# Patient Record
Sex: Female | Born: 1940 | Race: White | Hispanic: No | Marital: Married | Smoking: Former smoker
Health system: Southern US, Community
[De-identification: ages and names within clinical notes are randomized; demographics above are authoritative.]

## PROBLEM LIST (undated history)

## (undated) DIAGNOSIS — M8580 Other specified disorders of bone density and structure, unspecified site: Secondary | ICD-10-CM

## (undated) DIAGNOSIS — F411 Generalized anxiety disorder: Secondary | ICD-10-CM

## (undated) DIAGNOSIS — N200 Calculus of kidney: Secondary | ICD-10-CM

## (undated) DIAGNOSIS — K589 Irritable bowel syndrome without diarrhea: Secondary | ICD-10-CM

## (undated) DIAGNOSIS — H269 Unspecified cataract: Secondary | ICD-10-CM

## (undated) DIAGNOSIS — R002 Palpitations: Secondary | ICD-10-CM

## (undated) DIAGNOSIS — K5790 Diverticulosis of intestine, part unspecified, without perforation or abscess without bleeding: Secondary | ICD-10-CM

## (undated) DIAGNOSIS — K219 Gastro-esophageal reflux disease without esophagitis: Secondary | ICD-10-CM

## (undated) DIAGNOSIS — N6019 Diffuse cystic mastopathy of unspecified breast: Secondary | ICD-10-CM

## (undated) DIAGNOSIS — T7840XA Allergy, unspecified, initial encounter: Secondary | ICD-10-CM

## (undated) DIAGNOSIS — L259 Unspecified contact dermatitis, unspecified cause: Secondary | ICD-10-CM

## (undated) DIAGNOSIS — I471 Supraventricular tachycardia, unspecified: Secondary | ICD-10-CM

## (undated) DIAGNOSIS — M810 Age-related osteoporosis without current pathological fracture: Secondary | ICD-10-CM

## (undated) DIAGNOSIS — C4492 Squamous cell carcinoma of skin, unspecified: Secondary | ICD-10-CM

## (undated) DIAGNOSIS — H9209 Otalgia, unspecified ear: Secondary | ICD-10-CM

## (undated) DIAGNOSIS — R079 Chest pain, unspecified: Secondary | ICD-10-CM

## (undated) DIAGNOSIS — K861 Other chronic pancreatitis: Secondary | ICD-10-CM

## (undated) DIAGNOSIS — K7689 Other specified diseases of liver: Secondary | ICD-10-CM

## (undated) DIAGNOSIS — D126 Benign neoplasm of colon, unspecified: Secondary | ICD-10-CM

## (undated) DIAGNOSIS — M1990 Unspecified osteoarthritis, unspecified site: Secondary | ICD-10-CM

## (undated) DIAGNOSIS — I951 Orthostatic hypotension: Secondary | ICD-10-CM

## (undated) DIAGNOSIS — S6992XA Unspecified injury of left wrist, hand and finger(s), initial encounter: Secondary | ICD-10-CM

## (undated) DIAGNOSIS — Z87891 Personal history of nicotine dependence: Secondary | ICD-10-CM

## (undated) DIAGNOSIS — Z0000 Encounter for general adult medical examination without abnormal findings: Secondary | ICD-10-CM

## (undated) DIAGNOSIS — R55 Syncope and collapse: Secondary | ICD-10-CM

## (undated) DIAGNOSIS — K648 Other hemorrhoids: Secondary | ICD-10-CM

## (undated) DIAGNOSIS — Z8619 Personal history of other infectious and parasitic diseases: Secondary | ICD-10-CM

## (undated) DIAGNOSIS — E559 Vitamin D deficiency, unspecified: Secondary | ICD-10-CM

## (undated) DIAGNOSIS — N289 Disorder of kidney and ureter, unspecified: Secondary | ICD-10-CM

## (undated) DIAGNOSIS — M25519 Pain in unspecified shoulder: Secondary | ICD-10-CM

## (undated) DIAGNOSIS — K760 Fatty (change of) liver, not elsewhere classified: Secondary | ICD-10-CM

## (undated) HISTORY — DX: Encounter for general adult medical examination without abnormal findings: Z00.00

## (undated) HISTORY — DX: Other chronic pancreatitis: K86.1

## (undated) HISTORY — DX: Diffuse cystic mastopathy of unspecified breast: N60.19

## (undated) HISTORY — DX: Unspecified contact dermatitis, unspecified cause: L25.9

## (undated) HISTORY — DX: Allergy, unspecified, initial encounter: T78.40XA

## (undated) HISTORY — PX: CARDIAC CATHETERIZATION: SHX172

## (undated) HISTORY — PX: BREAST SURGERY: SHX581

## (undated) HISTORY — DX: Orthostatic hypotension: I95.1

## (undated) HISTORY — DX: Benign neoplasm of colon, unspecified: D12.6

## (undated) HISTORY — DX: Unspecified injury of left wrist, hand and finger(s), initial encounter: S69.92XA

## (undated) HISTORY — DX: Syncope and collapse: R55

## (undated) HISTORY — DX: Palpitations: R00.2

## (undated) HISTORY — DX: Unspecified osteoarthritis, unspecified site: M19.90

## (undated) HISTORY — PX: POLYPECTOMY: SHX149

## (undated) HISTORY — DX: Squamous cell carcinoma of skin, unspecified: C44.92

## (undated) HISTORY — DX: Vitamin D deficiency, unspecified: E55.9

## (undated) HISTORY — DX: Supraventricular tachycardia, unspecified: I47.10

## (undated) HISTORY — DX: Gastro-esophageal reflux disease without esophagitis: K21.9

## (undated) HISTORY — DX: Chest pain, unspecified: R07.9

## (undated) HISTORY — PX: CATARACT EXTRACTION, BILATERAL: SHX1313

## (undated) HISTORY — DX: Pain in unspecified shoulder: M25.519

## (undated) HISTORY — PX: EYE SURGERY: SHX253

## (undated) HISTORY — PX: COLONOSCOPY: SHX174

## (undated) HISTORY — DX: Age-related osteoporosis without current pathological fracture: M81.0

## (undated) HISTORY — DX: Personal history of other infectious and parasitic diseases: Z86.19

## (undated) HISTORY — DX: Other specified disorders of bone density and structure, unspecified site: M85.80

## (undated) HISTORY — DX: Irritable bowel syndrome, unspecified: K58.9

## (undated) HISTORY — DX: Generalized anxiety disorder: F41.1

## (undated) HISTORY — DX: Supraventricular tachycardia: I47.1

## (undated) HISTORY — DX: Personal history of nicotine dependence: Z87.891

## (undated) HISTORY — DX: Other hemorrhoids: K64.8

## (undated) HISTORY — PX: TUBAL LIGATION: SHX77

## (undated) HISTORY — DX: Unspecified cataract: H26.9

## (undated) HISTORY — DX: Other specified diseases of liver: K76.89

## (undated) HISTORY — DX: Otalgia, unspecified ear: H92.09

## (undated) HISTORY — DX: Diverticulosis of intestine, part unspecified, without perforation or abscess without bleeding: K57.90

## (undated) HISTORY — DX: Fatty (change of) liver, not elsewhere classified: K76.0

## (undated) HISTORY — DX: Disorder of kidney and ureter, unspecified: N28.9

---
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
## 1996-05-27 HISTORY — PX: ABDOMINAL HYSTERECTOMY: SHX81

## 1997-05-12 ENCOUNTER — Encounter: Payer: Self-pay | Admitting: Internal Medicine

## 1997-05-27 HISTORY — PX: CHOLECYSTECTOMY: SHX55

## 1997-10-03 ENCOUNTER — Other Ambulatory Visit: Admission: RE | Admit: 1997-10-03 | Discharge: 1997-10-03 | Payer: Self-pay | Admitting: *Deleted

## 1997-11-25 ENCOUNTER — Observation Stay (HOSPITAL_COMMUNITY): Admission: RE | Admit: 1997-11-25 | Discharge: 1997-11-26 | Payer: Self-pay | Admitting: General Surgery

## 1998-10-10 ENCOUNTER — Other Ambulatory Visit: Admission: RE | Admit: 1998-10-10 | Discharge: 1998-10-10 | Payer: Self-pay | Admitting: *Deleted

## 1999-10-15 ENCOUNTER — Other Ambulatory Visit: Admission: RE | Admit: 1999-10-15 | Discharge: 1999-10-15 | Payer: Self-pay | Admitting: *Deleted

## 1999-12-19 ENCOUNTER — Encounter: Payer: Self-pay | Admitting: Gastroenterology

## 2000-10-15 ENCOUNTER — Other Ambulatory Visit: Admission: RE | Admit: 2000-10-15 | Discharge: 2000-10-15 | Payer: Self-pay | Admitting: *Deleted

## 2002-03-03 ENCOUNTER — Inpatient Hospital Stay (HOSPITAL_COMMUNITY): Admission: AD | Admit: 2002-03-03 | Discharge: 2002-03-05 | Payer: Self-pay | Admitting: Cardiology

## 2002-03-03 ENCOUNTER — Encounter: Payer: Self-pay | Admitting: Cardiology

## 2002-03-04 ENCOUNTER — Encounter: Payer: Self-pay | Admitting: Cardiology

## 2003-10-31 ENCOUNTER — Other Ambulatory Visit: Admission: RE | Admit: 2003-10-31 | Discharge: 2003-10-31 | Payer: Self-pay | Admitting: Obstetrics and Gynecology

## 2004-04-05 ENCOUNTER — Ambulatory Visit: Payer: Self-pay | Admitting: Internal Medicine

## 2004-04-20 ENCOUNTER — Ambulatory Visit: Payer: Self-pay | Admitting: Gastroenterology

## 2004-05-29 ENCOUNTER — Ambulatory Visit: Payer: Self-pay | Admitting: Internal Medicine

## 2004-06-27 ENCOUNTER — Ambulatory Visit: Payer: Self-pay | Admitting: Gastroenterology

## 2004-12-18 ENCOUNTER — Ambulatory Visit: Payer: Self-pay | Admitting: Internal Medicine

## 2004-12-25 ENCOUNTER — Ambulatory Visit: Payer: Self-pay | Admitting: Gastroenterology

## 2005-01-14 ENCOUNTER — Ambulatory Visit: Payer: Self-pay | Admitting: Internal Medicine

## 2005-03-22 ENCOUNTER — Ambulatory Visit: Payer: Self-pay | Admitting: Internal Medicine

## 2005-12-26 ENCOUNTER — Ambulatory Visit: Payer: Self-pay | Admitting: Gastroenterology

## 2006-02-10 ENCOUNTER — Ambulatory Visit: Payer: Self-pay | Admitting: Internal Medicine

## 2006-03-28 ENCOUNTER — Ambulatory Visit: Payer: Self-pay | Admitting: Internal Medicine

## 2006-06-18 ENCOUNTER — Ambulatory Visit: Payer: Self-pay | Admitting: Internal Medicine

## 2006-09-18 ENCOUNTER — Ambulatory Visit: Payer: Self-pay | Admitting: Internal Medicine

## 2007-01-06 ENCOUNTER — Ambulatory Visit: Payer: Self-pay | Admitting: Gastroenterology

## 2007-01-06 LAB — CONVERTED CEMR LAB
ALT: 33 units/L (ref 0–35)
AST: 35 units/L (ref 0–37)
Albumin: 3.8 g/dL (ref 3.5–5.2)
Alkaline Phosphatase: 71 units/L (ref 39–117)
Amylase: 157 units/L — ABNORMAL HIGH (ref 27–131)
BUN: 13 mg/dL (ref 6–23)
Basophils Absolute: 0.1 10*3/uL (ref 0.0–0.1)
Basophils Relative: 0.8 % (ref 0.0–1.0)
Bilirubin, Direct: 0.1 mg/dL (ref 0.0–0.3)
CO2: 32 meq/L (ref 19–32)
Calcium: 8.8 mg/dL (ref 8.4–10.5)
Chloride: 105 meq/L (ref 96–112)
Creatinine, Ser: 0.8 mg/dL (ref 0.4–1.2)
Eosinophils Absolute: 0.3 10*3/uL (ref 0.0–0.6)
Eosinophils Relative: 3.8 % (ref 0.0–5.0)
Ferritin: 19.4 ng/mL (ref 10.0–291.0)
Folate: >20 ng/mL
GFR calc Af Amer: 92 mL/min
GFR calc non Af Amer: 76 mL/min
Glucose, Bld: 97 mg/dL (ref 70–99)
HCT: 39.6 % (ref 36.0–46.0)
Hemoglobin: 13.5 g/dL (ref 12.0–15.0)
Iron: 91 ug/dL (ref 42–145)
Lipase: 60 units/L — ABNORMAL HIGH (ref 11.0–59.0)
Lymphocytes Relative: 27.2 % (ref 12.0–46.0)
MCHC: 34 g/dL (ref 30.0–36.0)
MCV: 92.1 fL (ref 78.0–100.0)
Monocytes Absolute: 0.6 10*3/uL (ref 0.2–0.7)
Monocytes Relative: 8.6 % (ref 3.0–11.0)
Neutro Abs: 4.2 10*3/uL (ref 1.4–7.7)
Neutrophils Relative %: 59.6 % (ref 43.0–77.0)
Platelets: 270 10*3/uL (ref 150–400)
Potassium: 4.4 meq/L (ref 3.5–5.1)
RBC: 4.3 M/uL (ref 3.87–5.11)
RDW: 13.4 % (ref 11.5–14.6)
Saturation Ratios: 25.6 % (ref 20.0–50.0)
Sodium: 143 meq/L (ref 135–145)
TSH: 1.99 microintl units/mL (ref 0.35–5.50)
Tissue Transglutaminase Ab, IgA: 0.2 units (ref <7)
Total Bilirubin: 0.8 mg/dL (ref 0.3–1.2)
Total Protein: 6.3 g/dL (ref 6.0–8.3)
Transferrin: 254.2 mg/dL (ref >=212.0)
Vitamin B-12: 765 pg/mL (ref 211–911)
WBC: 7.2 10*3/uL (ref 4.5–10.5)

## 2007-01-10 ENCOUNTER — Encounter: Payer: Self-pay | Admitting: Internal Medicine

## 2007-01-10 DIAGNOSIS — I471 Supraventricular tachycardia, unspecified: Secondary | ICD-10-CM | POA: Insufficient documentation

## 2007-01-10 DIAGNOSIS — K861 Other chronic pancreatitis: Secondary | ICD-10-CM

## 2007-01-10 DIAGNOSIS — M1990 Unspecified osteoarthritis, unspecified site: Secondary | ICD-10-CM | POA: Insufficient documentation

## 2007-01-16 ENCOUNTER — Ambulatory Visit (HOSPITAL_COMMUNITY): Admission: RE | Admit: 2007-01-16 | Discharge: 2007-01-16 | Payer: Self-pay | Admitting: Gastroenterology

## 2007-02-17 ENCOUNTER — Ambulatory Visit: Payer: Self-pay | Admitting: Internal Medicine

## 2007-03-27 ENCOUNTER — Ambulatory Visit: Payer: Self-pay | Admitting: Internal Medicine

## 2007-05-01 ENCOUNTER — Ambulatory Visit: Payer: Self-pay | Admitting: Internal Medicine

## 2007-05-01 DIAGNOSIS — N3090 Cystitis, unspecified without hematuria: Secondary | ICD-10-CM | POA: Insufficient documentation

## 2007-05-01 DIAGNOSIS — E559 Vitamin D deficiency, unspecified: Secondary | ICD-10-CM

## 2007-05-01 DIAGNOSIS — K649 Unspecified hemorrhoids: Secondary | ICD-10-CM

## 2007-05-01 DIAGNOSIS — K219 Gastro-esophageal reflux disease without esophagitis: Secondary | ICD-10-CM

## 2007-05-01 DIAGNOSIS — R300 Dysuria: Secondary | ICD-10-CM

## 2007-05-04 LAB — CONVERTED CEMR LAB
Ketones, ur: NEGATIVE mg/dL
Leukocytes, UA: NEGATIVE
Specific Gravity, Urine: >=1.03 (ref 1.000–1.03)
Urine Glucose: NEGATIVE mg/dL
Urobilinogen, UA: 0.2 (ref 0.0–1.0)
pH: 6 (ref 5.0–8.0)

## 2007-05-06 ENCOUNTER — Encounter: Payer: Self-pay | Admitting: Internal Medicine

## 2007-05-13 ENCOUNTER — Encounter: Payer: Self-pay | Admitting: Internal Medicine

## 2007-05-28 LAB — HM COLONOSCOPY: HM Colonoscopy: NORMAL

## 2007-06-23 ENCOUNTER — Ambulatory Visit: Payer: Self-pay | Admitting: Gastroenterology

## 2007-06-23 LAB — CONVERTED CEMR LAB
ALT: 18 units/L (ref 0–35)
AST: 20 units/L (ref 0–37)
Albumin: 3.9 g/dL (ref 3.5–5.2)
Basophils Absolute: 0 10*3/uL (ref 0.0–0.1)
Calcium: 8.8 mg/dL (ref 8.4–10.5)
Chloride: 104 meq/L (ref 96–112)
Eosinophils Absolute: 0.1 10*3/uL (ref 0.0–0.6)
Eosinophils Relative: 0.8 % (ref 0.0–5.0)
GFR calc non Af Amer: 76 mL/min
Glucose, Bld: 92 mg/dL (ref 70–99)
MCV: 93.4 fL (ref 78.0–100.0)
Platelets: 235 10*3/uL (ref 150–400)
RBC: 4.3 M/uL (ref 3.87–5.11)
RDW: 13 % (ref 11.5–14.6)
TSH: 1.92 microintl units/mL (ref 0.35–5.50)
WBC: 7.1 10*3/uL (ref 4.5–10.5)

## 2007-06-30 ENCOUNTER — Ambulatory Visit: Payer: Self-pay | Admitting: Gastroenterology

## 2007-08-29 DIAGNOSIS — F411 Generalized anxiety disorder: Secondary | ICD-10-CM

## 2007-09-01 ENCOUNTER — Ambulatory Visit: Payer: Self-pay | Admitting: Internal Medicine

## 2007-12-29 ENCOUNTER — Ambulatory Visit: Payer: Self-pay | Admitting: Internal Medicine

## 2007-12-29 DIAGNOSIS — M25519 Pain in unspecified shoulder: Secondary | ICD-10-CM

## 2008-01-12 ENCOUNTER — Ambulatory Visit: Payer: Self-pay | Admitting: Gastroenterology

## 2008-01-12 DIAGNOSIS — K5900 Constipation, unspecified: Secondary | ICD-10-CM | POA: Insufficient documentation

## 2008-01-25 ENCOUNTER — Telehealth: Payer: Self-pay | Admitting: Gastroenterology

## 2008-01-27 ENCOUNTER — Ambulatory Visit: Payer: Self-pay | Admitting: Gastroenterology

## 2008-01-27 ENCOUNTER — Encounter: Payer: Self-pay | Admitting: Gastroenterology

## 2008-02-02 ENCOUNTER — Encounter: Payer: Self-pay | Admitting: Gastroenterology

## 2008-02-17 ENCOUNTER — Ambulatory Visit: Payer: Self-pay | Admitting: Internal Medicine

## 2008-03-25 ENCOUNTER — Ambulatory Visit: Payer: Self-pay | Admitting: Internal Medicine

## 2008-05-04 ENCOUNTER — Encounter: Payer: Self-pay | Admitting: Internal Medicine

## 2008-08-15 ENCOUNTER — Ambulatory Visit: Payer: Self-pay | Admitting: Internal Medicine

## 2008-08-15 DIAGNOSIS — J209 Acute bronchitis, unspecified: Secondary | ICD-10-CM | POA: Insufficient documentation

## 2008-08-19 ENCOUNTER — Telehealth: Payer: Self-pay | Admitting: Internal Medicine

## 2008-09-28 ENCOUNTER — Telehealth: Payer: Self-pay | Admitting: Gastroenterology

## 2008-09-29 ENCOUNTER — Telehealth: Payer: Self-pay | Admitting: Gastroenterology

## 2008-10-05 ENCOUNTER — Telehealth: Payer: Self-pay | Admitting: Gastroenterology

## 2008-12-04 ENCOUNTER — Observation Stay (HOSPITAL_COMMUNITY): Admission: EM | Admit: 2008-12-04 | Discharge: 2008-12-05 | Payer: Self-pay | Admitting: Emergency Medicine

## 2008-12-05 ENCOUNTER — Telehealth (INDEPENDENT_AMBULATORY_CARE_PROVIDER_SITE_OTHER): Payer: Self-pay | Admitting: *Deleted

## 2008-12-06 ENCOUNTER — Ambulatory Visit: Payer: Self-pay

## 2008-12-06 ENCOUNTER — Encounter: Payer: Self-pay | Admitting: Cardiovascular Disease

## 2008-12-07 ENCOUNTER — Telehealth: Payer: Self-pay | Admitting: Internal Medicine

## 2008-12-08 ENCOUNTER — Telehealth: Payer: Self-pay | Admitting: Internal Medicine

## 2008-12-12 ENCOUNTER — Telehealth: Payer: Self-pay | Admitting: Gastroenterology

## 2008-12-13 ENCOUNTER — Ambulatory Visit: Payer: Self-pay | Admitting: Gastroenterology

## 2008-12-13 DIAGNOSIS — R079 Chest pain, unspecified: Secondary | ICD-10-CM

## 2008-12-13 DIAGNOSIS — R1013 Epigastric pain: Secondary | ICD-10-CM | POA: Insufficient documentation

## 2008-12-14 ENCOUNTER — Ambulatory Visit: Payer: Self-pay | Admitting: Cardiology

## 2008-12-15 ENCOUNTER — Telehealth: Payer: Self-pay | Admitting: Physician Assistant

## 2008-12-15 LAB — CONVERTED CEMR LAB
Albumin: 3.5 g/dL (ref 3.5–5.2)
CO2: 33 meq/L — ABNORMAL HIGH (ref 19–32)
Chloride: 106 meq/L (ref 96–112)
Lipase: 48 units/L (ref 11.0–59.0)
Sodium: 143 meq/L (ref 135–145)

## 2008-12-16 ENCOUNTER — Telehealth: Payer: Self-pay | Admitting: Physician Assistant

## 2009-01-03 ENCOUNTER — Ambulatory Visit: Payer: Self-pay | Admitting: Gastroenterology

## 2009-01-06 ENCOUNTER — Telehealth: Payer: Self-pay | Admitting: Gastroenterology

## 2009-01-17 ENCOUNTER — Telehealth: Payer: Self-pay | Admitting: Gastroenterology

## 2009-02-08 ENCOUNTER — Encounter (INDEPENDENT_AMBULATORY_CARE_PROVIDER_SITE_OTHER): Payer: Self-pay | Admitting: *Deleted

## 2009-02-27 ENCOUNTER — Ambulatory Visit: Payer: Self-pay | Admitting: Internal Medicine

## 2009-03-02 LAB — CONVERTED CEMR LAB
Specific Gravity, Urine: 1.01 (ref 1.000–1.030)
Urobilinogen, UA: 0.2 (ref 0.0–1.0)

## 2009-03-20 ENCOUNTER — Encounter (INDEPENDENT_AMBULATORY_CARE_PROVIDER_SITE_OTHER): Payer: Self-pay | Admitting: *Deleted

## 2009-03-29 ENCOUNTER — Ambulatory Visit: Payer: Self-pay | Admitting: Internal Medicine

## 2009-03-29 DIAGNOSIS — H6590 Unspecified nonsuppurative otitis media, unspecified ear: Secondary | ICD-10-CM | POA: Insufficient documentation

## 2009-03-29 DIAGNOSIS — Z87891 Personal history of nicotine dependence: Secondary | ICD-10-CM | POA: Insufficient documentation

## 2009-04-17 ENCOUNTER — Telehealth: Payer: Self-pay | Admitting: Gastroenterology

## 2009-05-31 ENCOUNTER — Encounter: Payer: Self-pay | Admitting: Internal Medicine

## 2009-06-09 ENCOUNTER — Telehealth: Payer: Self-pay | Admitting: Gastroenterology

## 2009-06-20 ENCOUNTER — Ambulatory Visit: Payer: Self-pay | Admitting: Gastroenterology

## 2009-06-20 DIAGNOSIS — R159 Full incontinence of feces: Secondary | ICD-10-CM | POA: Insufficient documentation

## 2009-10-03 ENCOUNTER — Ambulatory Visit: Payer: Self-pay | Admitting: Internal Medicine

## 2010-01-09 ENCOUNTER — Ambulatory Visit: Payer: Self-pay | Admitting: Gastroenterology

## 2010-01-09 LAB — CONVERTED CEMR LAB
AST: 23 units/L (ref 0–37)
BUN: 13 mg/dL (ref 6–23)
Basophils Relative: 0.5 % (ref 0.0–3.0)
Calcium: 10.1 mg/dL (ref 8.4–10.5)
Eosinophils Absolute: 0.1 10*3/uL (ref 0.0–0.7)
Ferritin: 23.4 ng/mL (ref 10.0–291.0)
Folate: >20 ng/mL
GFR calc non Af Amer: 74.32 mL/min (ref >60)
IgA: 188 mg/dL (ref 68–378)
Iron: 86 ug/dL (ref 42–145)
Lymphs Abs: 1.9 10*3/uL (ref 0.7–4.0)
MCHC: 33.4 g/dL (ref 30.0–36.0)
MCV: 97.2 fL (ref 78.0–100.0)
Monocytes Absolute: 0.4 10*3/uL (ref 0.1–1.0)
Neutrophils Relative %: 61.4 % (ref 43.0–77.0)
Platelets: 251 10*3/uL (ref 150.0–400.0)
Potassium: 4.6 meq/L (ref 3.5–5.1)
RBC: 4.28 M/uL (ref 3.87–5.11)
Sed Rate: 8 mm/hr (ref 0–22)
TSH: 2.25 microintl units/mL (ref 0.35–5.50)
Tissue Transglutaminase Ab, IgA: 6.3 units (ref <20)
Total Bilirubin: 0.6 mg/dL (ref 0.3–1.2)
Vitamin B-12: 674 pg/mL (ref 211–911)

## 2010-01-10 ENCOUNTER — Encounter (INDEPENDENT_AMBULATORY_CARE_PROVIDER_SITE_OTHER): Payer: Self-pay | Admitting: *Deleted

## 2010-01-10 ENCOUNTER — Telehealth (INDEPENDENT_AMBULATORY_CARE_PROVIDER_SITE_OTHER): Payer: Self-pay | Admitting: *Deleted

## 2010-01-11 ENCOUNTER — Encounter: Payer: Self-pay | Admitting: Gastroenterology

## 2010-01-19 ENCOUNTER — Telehealth: Payer: Self-pay | Admitting: Gastroenterology

## 2010-01-22 ENCOUNTER — Telehealth: Payer: Self-pay | Admitting: Gastroenterology

## 2010-01-25 ENCOUNTER — Ambulatory Visit (HOSPITAL_COMMUNITY): Admission: RE | Admit: 2010-01-25 | Discharge: 2010-01-25 | Payer: Self-pay | Admitting: Gastroenterology

## 2010-01-25 ENCOUNTER — Ambulatory Visit: Payer: Self-pay | Admitting: Gastroenterology

## 2010-02-26 ENCOUNTER — Ambulatory Visit: Payer: Self-pay | Admitting: Internal Medicine

## 2010-02-26 DIAGNOSIS — I951 Orthostatic hypotension: Secondary | ICD-10-CM

## 2010-02-26 DIAGNOSIS — R002 Palpitations: Secondary | ICD-10-CM

## 2010-03-07 ENCOUNTER — Ambulatory Visit: Payer: Self-pay | Admitting: Internal Medicine

## 2010-03-12 ENCOUNTER — Ambulatory Visit: Payer: Self-pay | Admitting: Internal Medicine

## 2010-03-14 LAB — CONVERTED CEMR LAB
Chloride: 105 meq/L (ref 96–112)
GFR calc non Af Amer: 81.18 mL/min (ref >60)
Potassium: 4.1 meq/L (ref 3.5–5.1)
Sodium: 143 meq/L (ref 135–145)

## 2010-05-19 ENCOUNTER — Emergency Department (HOSPITAL_COMMUNITY)
Admission: EM | Admit: 2010-05-19 | Discharge: 2010-05-19 | Payer: Self-pay | Source: Home / Self Care | Admitting: Emergency Medicine

## 2010-05-22 ENCOUNTER — Telehealth: Payer: Self-pay | Admitting: Internal Medicine

## 2010-06-13 ENCOUNTER — Encounter: Payer: Self-pay | Admitting: Internal Medicine

## 2010-06-26 NOTE — Assessment & Plan Note (Signed)
Summary: FLU VAC--AVP--PER Maralyn Sago DOUBLEBOOK--STC  Nurse Visit   Allergies: 1)  ! Pcn 2)  ! Epinephrine 3)  ! Zithromax 4)  Amoxicillin (Amoxicillin) 5)  Cipro 6)  Twinject (Epinephrine) 7)  Sulfa  Orders Added: 1)  Flu Vaccine 31yrs + MEDICARE PATIENTS [Q2039] 2)  Administration Flu vaccine - MCR [G0008]     Flu Vaccine Consent Questions     Do you have a history of severe allergic reactions to this vaccine? no    Any prior history of allergic reactions to egg and/or gelatin? no    Do you have a sensitivity to the preservative Thimersol? no    Do you have a past history of Guillan-Barre Syndrome? no    Do you currently have an acute febrile illness? no    Have you ever had a severe reaction to latex? no    Vaccine information given and explained to patient? yes    Are you currently pregnant? no    Lot Number:[id number]   Exp Date:11/24/2010   Site Given  Left Deltoid IMlu ............ Lamar Sprinkles, CMA  March 07, 2010 9:32 AM

## 2010-06-26 NOTE — Letter (Signed)
Summary: EGD Instructions   Gastroenterology  [address] [place], [place] [address]   Phone: [phone]  Fax: [phone]       [name]    01-09-1941    MRN: [id number]       Procedure Day /Date:01/25/10 ONGEX     Arrival Time: 130 pm     Procedure Time:230 pm     Location of Procedure:                     [facility] ( Outpatient Registration)    PREPARATION FOR ENDOSCOPY   On 01/25/10  THE DAY OF THE PROCEDURE:  1.   No solid foods, milk or milk products are allowed after midnight the night before your procedure.  2.   Do not drink anything colored red or purple.  Avoid juices with pulp.  No orange juice.  3.  You may drink clear liquids until 1030 am , which is 4 hours before your procedure.                                                                                                CLEAR LIQUIDS INCLUDE: Water Jello Ice Popsicles Tea (sugar ok, no milk/cream) Powdered fruit flavored drinks Coffee (sugar ok, no milk/cream) Gatorade Juice: apple, white grape, white cranberry  Lemonade Clear bullion, consomm, broth Carbonated beverages (any kind) Strained chicken noodle soup Hard Candy   MEDICATION INSTRUCTIONS  Unless otherwise instructed, you should take regular prescription medications with a small sip of water as early as possible the morning of your procedure.               OTHER INSTRUCTIONS  You will need a responsible adult at least 71 years of age to accompany you and drive you home.   This person must remain in the waiting room during your procedure.  Wear loose fitting clothing that is easily removed.  Leave jewelry and other valuables at home.  However, you may wish to bring a book to read or an iPod/MP3 player to listen to music as you wait for your procedure to start.  Remove all body piercing jewelry and leave at home.  Total time from sign-in until discharge is approximately 2-3 hours.  You should go  home directly after your procedure and rest.  You can resume normal activities the day after your procedure.  The day of your procedure you should not:   Drive   Make legal decisions   Operate machinery   Drink alcohol   Return to work  You will receive specific instructions about eating, activities and medications before you leave.    The above instructions have been reviewed and explained to me by   Chales Abrahams CMA Duncan Dull)  January 10, 2010 1:14 PM     I fully understand and can verbalize these instructions over the phone mailed to home Date 01/10/10

## 2010-06-26 NOTE — Progress Notes (Signed)
Summary: EUS  Phone Note Outgoing Call   Call placed by: Chales Abrahams CMA Duncan Dull),  January 10, 2010 1:13 PM Summary of Call: pt scheduled for EUS meds reviewed and instructed  pt Initial call taken by: Chales Abrahams CMA Duncan Dull),  January 10, 2010 1:13 PM

## 2010-06-26 NOTE — Assessment & Plan Note (Signed)
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 Generic Viokase is no longer available, and this previously controlled her gastrointestinal issues. She now is on Creon and continue the gas and bloating and vague epigastric discomfort. She actually was rehospitalized last summer with abdominal pain and had an abnormal CT scan showing enlargement of the head and body of the pancreas with a prominent tail. She has discontinued carbonated beverages which has helped her vague discomfort. 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  She currently is doing well and denies anorexia, or weight loss. She does have periodic diarrhea and perhaps intermittent sdteatorrhea. Last ERCP was in 1998 and suggested small duct disease.   GI Review of Systems      Denies abdominal pain, acid reflux, belching, bloating, chest pain, dysphagia with liquids, dysphagia with solids, heartburn, loss of appetite, nausea, vomiting, vomiting blood, weight loss, and  weight gain.        Denies anal fissure, black tarry stools, change in bowel habit, constipation, diarrhea, diverticulosis, fecal incontinence, heme positive stool, hemorrhoids, irritable bowel syndrome, jaundice, light color stool, liver problems, rectal bleeding, and  rectal pain.    Current Medications (verified): 1)  Creon 24000 Unit Cpep (Pancrelipase (Lip-Prot-Amyl)) .... Take 6 Tabs By Mouth Three Times A Day 2)  Lexapro 10 Mg  Tabs (Escitalopram Oxalate) .... 1/2 Once Daily 3)  Zyrtec  Allergy 10 Mg  Tabs (Cetirizine Hcl) .... Once Daily As Needed 4)  Vivelle-Dot 0.025 Mg/24hr  Pttw (Estradiol) .Marland Kitchen.. 1 Weekly 5)  Vitamin D3 1000 Unit  Tabs (Cholecalciferol) .Marland Kitchen.. 1 Qd 6)  Transderm-Scop 1.5 Mg  Pt72 (Scopolamine Base) .... Change 1 Q 3 D As Needed 7)  Anusol-Hc 25 Mg  Supp (Hydrocortisone Acetate) .Marland Kitchen.. 1 Pr Two Times A Day Prn 8)  Multivitamins   Tabs (Multiple Vitamin) .... Once Daily 9)  Aspirin 325 Mg  Tabs (Aspirin) .... One Tablet Every Day 10)  Fish Oil   Oil (Fish Oil) .... One By Mouth Every Day 11)  Align   Caps (Misc Intestinal Flora Regulat) .... One Cap Every Day 12)  Levsin/sl 0.125 Mg Subl (Hyoscyamine Sulfate) .... Place 1 Tab Under Tongue As Needed For Cramping 13)  Aciphex 20 Mg Tbec (Rabeprazole Sodium) .... Take 1 Tab Twice Daily 14)  Vitamin C Cr 500 Mg Cr-Caps (Ascorbic Acid) .Marland Kitchen.. 1 By Mouth Once Daily 15)  Calcium-Vitamin D 500-200 Mg-Unit Tabs (Calcium Carbonate-Vitamin D) .Marland Kitchen.. 1 By Mouth Once Daily 16)  Xifaxan 550 Mg  Tabs (Rifaximin) .... Take One By Mouth Two Times A Day For 3 Days If Needed For Diarrhea While Traveling 17)  Diflucan 150 Mg Tabs (Fluconazole) .Marland Kitchen.. 1 Once Daily Once Per Episode of Yeast Infection 18)  Fluticasone Propionate 50 Mcg/act  Susp (Fluticasone Propionate) .... 2 Sprays Each Nostril Once Daily 19)  Levaquin 500 Mg Tabs (Levofloxacin) .Marland Kitchen.. 1 By Mouth Qd 20)  Transderm-Scop 1.5 Mg Pt72 (Scopolamine Base) .... Change Q 3 D  Allergies (verified): 1)  ! Pcn 2)  ! Epinephrine 3)  ! Zithromax 4)  Amoxicillin (  Amoxicillin) 5)  Cipro 6)  Twinject (Epinephrine) 7)  Sulfa  Past History:  Family History: Last updated: 01/12/2008 Family History Hypertension No FH of Colon Cancer: bladder cancer/ father Family History of Breast Cancer:aunt  Family History of Ovarian Cancer:aunt Family History of Pancreatic Cancer:aunt Family History of Stomach Cancer:aunt Family History of Uterine Cancer:grandmother  Social History: Last  updated: 01/12/2008 Married Patient is a former smoker.  Alcohol Use - no Daily Caffeine Use  occ Illicit Drug Use - no Patient gets regular exercise.walks 5 miles four days a week and rides bike a day also lifts weights  Past medical, surgical, family and social histories (including risk factors) reviewed for relevance to current acute and chronic problems.  Past Medical History: Reviewed history from 12/13/2008 and no changes required. Osteoarthritis GERD CHRONIC IDIOPATHIC PANCREATITIS Hemorrhoids  Past Surgical History: Reviewed history from 01/12/2008 and no changes required. Hysterectomy 1998  Family History: Reviewed history from 01/12/2008 and no changes required. Family History Hypertension No FH of Colon Cancer: bladder cancer/ father Family History of Breast Cancer:aunt  Family History of Ovarian Cancer:aunt Family History of Pancreatic Cancer:aunt Family History of Stomach Cancer:aunt Family History of Uterine Cancer:grandmother  Social History: Reviewed history from 01/12/2008 and no changes required. Married Patient is a former smoker.  Alcohol Use - no Daily Caffeine Use  occ Illicit Drug Use - no Patient gets regular exercise.walks 5 miles four days a week and rides bike a day also lifts weights  Review of Systems  The patient denies allergy/sinus, anemia, anxiety-new, arthritis/joint pain, back pain, blood in urine, breast changes/lumps, change in vision, confusion, cough, coughing up blood, depression-new, fainting, fatigue, fever, headaches-new, hearing problems, heart murmur, heart rhythm changes, itching, menstrual pain, muscle pains/cramps, night sweats, nosebleeds, pregnancy symptoms, shortness of breath, skin rash, sleeping problems, sore throat, swelling of feet/legs, swollen lymph glands, thirst - excessive , urination - excessive , urination changes/pain, urine leakage, vision changes, and voice change.    Vital Signs:  Patient  profile:   70 year old female Height:      65 inches Weight:      119 pounds BMI:     19.87 BSA:     1.59 Pulse rate:   76 / minute Pulse rhythm:   regular BP sitting:   120 / 82  (left arm)  Vitals Entered By: Merri Ray CMA Duncan Dull) (January 09, 2010 11:45 AM)  Physical Exam  General:  Well developed, well nourished, no acute distress.healthy appearing.  healthy appearing.   Head:  Normocephalic and atraumatic. Eyes:  PERRLA, no icterus. Psych:  Alert and cooperative. Normal mood and affect.   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  Problem # 2:  CHRONIC PANCREATITIS (ICD-577.1) Assessment: Unchanged Yearly labs ordered and we'll go ahead and schedule endoscopic ultrasound exam of her pancreas.She is considering enrolling in a research study with Dr. Othella Boyer at the [facility] concerning pancreatic extract therapy and chronic pancreatitis. Patient has never been in ethanol abuser. Other causes of her chronic pancreatitis have been excluded over the last 20 years. As before, she does have a history of intermittent bacterial overgrowth syndrome, and is on multiple vitamins and minerals. Orders: TLB-CBC Platelet - w/Differential (85025-CBCD) TLB-BMP (Basic Metabolic Panel-BMET) (80048-METABOL) TLB-Hepatic/Liver Function Pnl (80076-HEPATIC) TLB-TSH (Thyroid Stimulating Hormone) (84443-TSH) TLB-B12, Serum-Total ONLY (21308-M57) TLB-Ferritin (82728-FER) TLB-Folic Acid (Folate) (82746-FOL) TLB-IBC Pnl (Iron/FE;Transferrin) (83550-IBC) TLB-Amylase (82150-AMYL) TLB-Lipase (83690-LIPASE)  TLB-Magnesium (Mg) (83735-MG) TLB-Sedimentation Rate (ESR) (85652-ESR) TLB-IgA (Immunoglobulin A) (82784-IGA) T-Sprue Panel (Celiac Disease Aby Eval) (83516x3/[address]) T- * Misc. Laboratory test [id number])  Problem # 3:  GERD (ICD-530.81) Assessment: Improved continue AcipHex 20 mg a  day with standard antireflux maneuvers  Problem # 4:  VITAMIN D DEFICIENCY (ICD-268.9) Assessment: Improved continue calcium and vitamin D replacement along with multivitamins. I have ordered magnesium, zinc, and thiamine levels.  Patient Instructions: 1)  Please go to the basement for lab work. 2)  We will contact you with an appointment for an Endoscopic Ultrasound.   3)  Please continue current medications.  4)  The medication list was reviewed and reconciled.  All changed / newly prescribed medications were explained.  A complete medication list was provided to the patient / caregiver. 5)  Copy sent to : Dr. Othella Boyer Department of gastroenterology, [facility] in [place] and Dr. Sonda Primes in [place] [facility]. 6)  Fecal Elastase-1 ordered and continue Creon tablets, AcipHex, and other medications as per primary care.  Appended Document: yearly f.u...em    Clinical Lists Changes  Medications: Added new medication of PANTOPRAZOLE SODIUM 40 MG  TBEC (PANTOPRAZOLE SODIUM) 1 by mouth two times a day - Signed Changed medication from LEVSIN/SL 0.125 MG SUBL (HYOSCYAMINE SULFATE) Place 1 tab under tongue as needed for cramping to LEVSIN/SL 0.125 MG SUBL (HYOSCYAMINE SULFATE) Place 1 tab under tongue as needed for cramping - Signed Rx of CREON 24000 UNIT CPEP (PANCRELIPASE (LIP-PROT-AMYL)) take 6 tabs by mouth three times a day;  #1620 x 3;  Signed;  Entered by: Ashok Cordia RN;  Authorized by: Mardella Layman MD [facility];  Method used: Faxed to [facility] [place], , , [place]  , Ph: [phone], Fax: [phone] Rx of PANTOPRAZOLE SODIUM 40 MG  TBEC (PANTOPRAZOLE SODIUM) 1 by mouth two times a day;  #180 x 3;  Signed;  Entered by: Ashok Cordia RN;  Authorized by: Mardella Layman MD [facility];  Method used: Faxed to [facility] [place], , , [place]  , Ph: [phone], Fax: [phone] Rx of LEVSIN/SL 0.125 MG SUBL (HYOSCYAMINE SULFATE) Place 1 tab under tongue as needed for cramping;  #40 x 3;   Signed;  Entered by: Ashok Cordia RN;  Authorized by: Mardella Layman MD [facility];  Method used: Electronically to [facility]  [phone]*, [address], [place], [place], [place]  [address], Ph: [phone] or [phone], Fax: [phone]    Prescriptions: LEVSIN/SL 0.125 MG SUBL (HYOSCYAMINE SULFATE) Place 1 tab under tongue as needed for cramping  #40 x 3   Entered by:   Ashok Cordia RN   Authorized by:   Mardella Layman MD [facility]   Signed by:   Ashok Cordia RN on 01/09/2010   Method used:   Electronically to        [facility]  [phone]* (retail)       [address]       [place], [place]  [address]       Ph: [phone] or [phone]       Fax: [phone]   RxID:   [id number] PANTOPRAZOLE SODIUM 40 MG  TBEC (PANTOPRAZOLE SODIUM) 1 by mouth two times a day  #180 x 3   Entered by:   Ashok Cordia RN   Authorized by:   Mardella Layman MD [facility]   Signed by:   Ashok Cordia RN on 01/09/2010   Method  used:   Faxed to ...       [facility] [place] (mail-order)             , [place]         Ph: [phone]       Fax: [phone]   RxID:   [id number] CREON 24000 UNIT CPEP (PANCRELIPASE (LIP-PROT-AMYL)) take 6 tabs by mouth three times a day  #1620 x 3   Entered by:   Ashok Cordia RN   Authorized by:   Mardella Layman MD [facility]   Signed by:   Ashok Cordia RN on 01/09/2010   Method used:   Faxed to ...       [facility] [place] (mail-order)             , [place]         Ph: [phone]       Fax: [phone]   RxID:   [id number]

## 2010-06-26 NOTE — Progress Notes (Signed)
Summary: Medical Study?  Phone Note Call from Patient Call back at [facility] Phone [phone]   Caller: Patient Call For: Dr Jarold Motto Reason for Call: Talk to Nurse Details for Reason: Medical Study? Summary of Call: Pt wanted to know outcome of conversation between Dr. Jarold Motto and  Dr.Toskes Initial call taken by: Dwan Bolt,  January 19, 2010 11:55 AM  Follow-up for Phone Call        he wants endoscopic ultrasound done. Follow-up by: Mardella Layman MD Clementeen Graham,  January 19, 2010 12:22 PM  Additional Follow-up for Phone Call Additional follow up Details #1::        Pt is sch for EUS next week.  Pt asks to have copy of labs mailed to her.  Mailed as requested. Additional Follow-up by: Ashok Cordia RN,  January 19, 2010 3:07 PM

## 2010-06-26 NOTE — Assessment & Plan Note (Signed)
Summary: f1y/ gd   Visit Type:  45yr f/u Referring Provider:  n/a Primary Provider:  Sonda Primes, MD  CC:  heart races and low bp.  History of Present Illness: [name] is seen in followup for orthostatic hypotension, palpitations associated with a presumed atrial tachycardia, and chest pain for which he underwent Myoview scan last year which was negative  she continues to struggle with orthostasis whichi is primarily  a problem after her 60-90 m inutes of exercise that she does 4 days a week  Her palps continue to be a problem rather annoyingly but not all that frequentyly and the episodes last only 4-5 beats or so  She also brings in blood work fwhich is abnormla with a HCO3 of 35;  her urine she describes as relatively yellow  Problems Prior to Update: 1)  Electrolyte Imbalance/ Elevated Hco3  (ICD-276.9) 2)  Palpitations, Recurrent  (ICD-785.1) 3)  Hypotension, Orthostatic  (ICD-458.0) 4)  Full Incontinence of Feces  (ICD-787.60) 5)  Otitis Media, Serous, Right  (ICD-381.4) 6)  Tobacco Use, Quit  (ICD-V15.82) 7)  Chronic Pancreatitis  (ICD-577.1) 8)  Gerd  (ICD-530.81) 9)  Chest Pain  (ICD-786.50) 10)  Abdominal Pain-epigastric  (ICD-789.06) 11)  Epigastric Pain  (ICD-789.06) 12)  Chest Pain  (ICD-786.50) 13)  Bronchitis, Acute  (ICD-466.0) 14)  Constipation  (ICD-564.00) 15)  Shoulder Pain  (ICD-719.41) 16)  Anxiety, Chronic  (ICD-300.00) 17)  Hemorrhoids, Nos  (ICD-455.6) 18)  Vitamin D Deficiency  (ICD-268.9) 19)  Dysuria  (ICD-788.1) 20)  Cystitis  (ICD-595.9) 21)  Gerd  (ICD-530.81) 22)  Pancreatitis, Chronic  (ICD-577.1) 23)  Psvt  (ICD-427.0) 24)  Osteoarthritis  (ICD-715.90)  Current Medications (verified): 1)  Creon 24000 Unit Cpep (Pancrelipase (Lip-Prot-Amyl)) .... Take 6 Tabs By Mouth Three Times A Day 2)  Lexapro 10 Mg  Tabs (Escitalopram Oxalate) .... 1/2 Once Daily 3)  Zyrtec Allergy 10 Mg  Tabs (Cetirizine Hcl) .... Once Daily As Needed 4)   Vivelle-Dot 0.025 Mg/24hr  Pttw (Estradiol) .Marland Kitchen.. 1 Weekly 5)  Vitamin D3 1000 Unit  Tabs (Cholecalciferol) .Marland Kitchen.. 1 Qd 6)  Transderm-Scop 1.5 Mg  Pt72 (Scopolamine Base) .... Change 1 Q 3 D As Needed 7)  Anusol-Hc 25 Mg  Supp (Hydrocortisone Acetate) .Marland Kitchen.. 1 Pr Two Times A Day Prn 8)  Multivitamins   Tabs (Multiple Vitamin) .... Once Daily 9)  Aspirin 325 Mg  Tabs (Aspirin) .... Once Daily 10)  Fish Oil   Oil (Fish Oil) .... One By Mouth Every Day 11)  Align   Caps (Misc Intestinal Flora Regulat) .... One Cap Every Day 12)  Levsin/sl 0.125 Mg Subl (Hyoscyamine Sulfate) .... Place 1 Tab Under Tongue As Needed For Cramping 13)  Aciphex 20 Mg Tbec (Rabeprazole Sodium) .... Take 1 Tab Twice Daily 14)  Vitamin C Cr 500 Mg Cr-Caps (Ascorbic Acid) .Marland Kitchen.. 1 By Mouth Once Daily 15)  Calcium-Vitamin D 500-200 Mg-Unit Tabs (Calcium Carbonate-Vitamin D) .Marland Kitchen.. 1 By Mouth Once Daily 16)  Fluticasone Propionate 50 Mcg/act  Susp (Fluticasone Propionate) .... As Needed  Allergies (verified): 1)  ! Pcn 2)  ! Epinephrine 3)  ! Zithromax 4)  Amoxicillin (Amoxicillin) 5)  Cipro 6)  Twinject (Epinephrine) 7)  Sulfa  Past History:  Past Medical History: Last updated: 12/13/2008 Osteoarthritis GERD CHRONIC IDIOPATHIC PANCREATITIS Hemorrhoids  Vital Signs:  Patient profile:   70 year old female Height:      65 inches Weight:      119.50 pounds Pulse rate:  67 / minute BP sitting:   132 / 80  (right arm) Cuff size:   regular  Vitals Entered By: Caralee Ates CMA (February 26, 2010 10:34 AM)  Physical Exam  General:  The patient was alert and oriented in no acute distress. HEENT Normal.  Neck veins were flat, carotids were brisk.  Lungs were clear.  Heart sounds were regular without murmurs or gallops.  Abdomen was soft with active bowel sounds. There is no clubbing cyanosis or edema. Skin Warm and dry    EKG  Procedure date:  02/26/2010  Findings:      sinus @ 67 .13/.09/.40 o/w  normal  Impression & Recommendations:  Problem # 1:  PALPITATIONS, RECURRENT (ICD-785.1) the patient continues to have episodic brief palpitations associated with the cough. She tolerates them adequately. We will continue her on no medications. Her updated medication list for this problem includes:    Aspirin 325 Mg Tabs (Aspirin) ..... Once daily  Problem # 2:  HYPOTENSION, ORTHOSTATIC (ICD-458.0) this is particularly problematic on days when she exercises. I have encouraged her to increase her volume intake prior to exercise. Will have her take ProAmatine 2.5 mg which she gets home from exercise on those days.  Problem # 3:  ELECTROLYTE IMBALANCE/ ELEVATED HCO3 (ICD-276.9) her elevated bicarbonate is noteworthy. He don't have a lot of possible explanations for this; I wonder whether is related to volume depletion given her exercise routine and her poor fluid intake. I've asked her to work on volume resuscitation I increased her volume to the point that her urine is clear and we will recheck it. In the event it remains abnormal I'll have to have her primary care physician pursue this.  Patient Instructions: 1)  Your physician has recommended you make the following change in your medication: Midodrine 2.5mg -take as directed. 2)  Your physician wants you to follow-up in:  1 year.  You will receive a reminder letter in the mail two months in advance. If you don't receive a letter, please call our office to schedule the follow-up appointment. 3)  Your physician recommends that you return for lab work on 10/17 at 8:30 am for a BMET Prescriptions: MIDODRINE HCL 2.5 MG TABS (MIDODRINE HCL) take as directed  #30 x 2   Entered by:   Claris Gladden RN   Authorized by:   Nathen May, MD, [facility]   Signed by:   Claris Gladden RN on 02/26/2010   Method used:   Electronically to        [facility]  [phone]* (retail)       [address]       [place], [place]  [address]       Ph: [phone] or [phone]       Fax: [phone]   RxID:   [id number]

## 2010-06-26 NOTE — Letter (Signed)
Summary: [facility]   Imported By: Sherian Rein 06/23/2009 12:24:25  _____________________________________________________________________  External Attachment:    Type:   Image     Comment:   External Document

## 2010-06-26 NOTE — Assessment & Plan Note (Signed)
Summary: DISCUSS MEDS/NWS   Vital Signs:  Patient profile:   70 year old female Height:      65 inches Weight:      122 pounds BMI:     20.38 O2 Sat:      98 % on Room air Temp:     97.7 degrees F oral Pulse rate:   76 / minute BP sitting:   116 / 80  (left arm) Cuff size:   regular  Vitals Entered By: Lucious Groves (Oct 03, 2009 1:59 PM)  O2 Flow:  Room air CC: OV to discuss meds./kb Is Patient Diabetic? No Pain Assessment Patient in pain? no      Comments Patient is going on a cruise and would like med for urination issues (Vesicare) and refill Transderm patch./kb   Primary Care Provider:  Sonda Primes, MD  CC:  OV to discuss meds./kb.  History of Present Illness: She is going on a cruise C/o vertigo on a ship  Current Medications (verified): 1)  Creon 24000 Unit Cpep (Pancrelipase (Lip-Prot-Amyl)) .... Take 6 Tabs By Mouth Three Times A Day 2)  Lexapro 10 Mg  Tabs (Escitalopram Oxalate) .... 1/2 Once Daily 3)  Zyrtec Allergy 10 Mg  Tabs (Cetirizine Hcl) .... Once Daily As Needed 4)  Vivelle-Dot 0.025 Mg/24hr  Pttw (Estradiol) .Marland Kitchen.. 1 Weekly 5)  Vitamin D3 1000 Unit  Tabs (Cholecalciferol) .Marland Kitchen.. 1 Qd 6)  Transderm-Scop 1.5 Mg  Pt72 (Scopolamine Base) .... Change 1 Q 3 D As Needed 7)  Anusol-Hc 25 Mg  Supp (Hydrocortisone Acetate) .Marland Kitchen.. 1 Pr Two Times A Day Prn 8)  Multivitamins   Tabs (Multiple Vitamin) .... Once Daily 9)  Aspirin 325 Mg  Tabs (Aspirin) .... One Tablet Every Day 10)  Fish Oil   Oil (Fish Oil) .... One By Mouth Every Day 11)  Align   Caps (Misc Intestinal Flora Regulat) .... One Cap Every Day 12)  Levsin/sl 0.125 Mg Subl (Hyoscyamine Sulfate) .... Place 1 Tab Under Tongue As Needed For Cramping 13)  Aciphex 20 Mg Tbec (Rabeprazole Sodium) .... Take 1 Tab Twice Daily 14)  Vitamin C Cr 500 Mg Cr-Caps (Ascorbic Acid) .Marland Kitchen.. 1 By Mouth Once Daily 15)  Calcium-Vitamin D 500-200 Mg-Unit Tabs (Calcium Carbonate-Vitamin D) .Marland Kitchen.. 1 By Mouth Once Daily 16)   Xifaxan 550 Mg  Tabs (Rifaximin) .... Take One By Mouth Two Times A Day For 3 Days If Needed For Diarrhea While Traveling 17)  Diflucan 150 Mg Tabs (Fluconazole) .Marland Kitchen.. 1 Once Daily Once Per Episode of Yeast Infection 18)  Fluticasone Propionate 50 Mcg/act  Susp (Fluticasone Propionate) .... 2 Sprays Each Nostril Once Daily  Allergies (verified): 1)  ! Pcn 2)  ! Epinephrine 3)  ! Zithromax 4)  Amoxicillin (Amoxicillin) 5)  Cipro 6)  Twinject (Epinephrine) 7)  Sulfa  Past History:  Past Medical History: Last updated: 12/13/2008 Osteoarthritis GERD CHRONIC IDIOPATHIC PANCREATITIS Hemorrhoids  Social History: Last updated: 01/12/2008 Married Patient is a former smoker.  Alcohol Use - no Daily Caffeine Use  occ Illicit Drug Use - no Patient gets regular exercise.walks 5 miles four days a week and rides bike a day also lifts weights  Review of Systems  The patient denies fever, chest pain, and abdominal pain.    Physical Exam  General:  Well developed, well nourished, no acute distress.healthy appearing.   Nose:  WNL  Lungs:  Normal respiratory effort, chest expands symmetrically. Lungs are clear to auscultation, no crackles or wheezes. Heart:  Normal rate and regular rhythm. S1 and S2 normal without gallop, murmur, click, rub or other extra sounds. Abdomen:  Bowel sounds positive,abdomen soft and non-tender without masses, organomegaly or hernias noted. Msk:  Symmetrical with no gross deformities. Normal posture. Extremities:  No clubbing, cyanosis, edema, or deformity noted with normal full range of motion of all joints.   Neurologic:  No cranial nerve deficits noted. Station and gait are normal. Plantar reflexes are down-going bilaterally. DTRs are symmetrical throughout. Sensory, motor and coordinative functions appear intact. Skin:  L index w/old subungul hematoma Psych:  Cognition and judgment appear intact. Alert and cooperative with normal attention span and  concentration. No apparent delusions, illusions, hallucinations   Impression & Recommendations:  Problem # 1:  Travel Assessment New See "Patient Instructions"/Meds .The office visit took longer than 25 min with patient councelling for more than 50% of the 20 min    Problem # 2:  OSTEOARTHRITIS (ICD-715.90) Assessment: Unchanged  Her updated medication list for this problem includes:    Aspirin 325 Mg Tabs (Aspirin) ..... 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 1 by mouth once daily to take on a trip  Complete Medication List: 1)  Creon 24000 Unit Cpep (Pancrelipase (lip-prot-amyl)) .... Take 6 tabs by mouth three times a day 2)  Lexapro 10 Mg Tabs (Escitalopram oxalate) .... 1/2 once daily 3)  Zyrtec Allergy 10 Mg Tabs (Cetirizine hcl) .... Once daily as needed 4)  Vivelle-dot 0.025 Mg/24hr Pttw (Estradiol) .Marland KitchenMarland KitchenMarland Kitchen 1 weekly 5)  Vitamin D3 1000 Unit Tabs (Cholecalciferol) .Marland Kitchen.. 1 qd 6)  Transderm-scop 1.5 Mg Pt72 (Scopolamine base) .... Change 1 q 3 d as needed 7)  Anusol-hc 25 Mg Supp (Hydrocortisone acetate) .Marland Kitchen.. 1 pr two times a day prn 8)  Multivitamins Tabs (Multiple vitamin) .... Once daily 9)  Aspirin 325 Mg Tabs (Aspirin) .... One tablet every day 10)  Fish Oil Oil (Fish oil) .... One by mouth every day 11)  Align Caps (Misc intestinal flora regulat) .... One cap every day 12)  Levsin/sl 0.125 Mg Subl (Hyoscyamine sulfate) .... Place 1 tab under tongue as needed for cramping 13)  Aciphex 20 Mg Tbec (Rabeprazole sodium) .... Take 1 tab twice daily 14)  Vitamin C Cr 500 Mg Cr-caps (Ascorbic acid) .Marland Kitchen.. 1 by mouth once daily 15)  Calcium-vitamin D 500-200 Mg-unit Tabs (Calcium carbonate-vitamin d) .Marland Kitchen.. 1 by mouth once daily 16)  Xifaxan 550 Mg Tabs (rifaximin)  ....  Take one by mouth two times a day for 3 days if needed for diarrhea while traveling 17)  Diflucan 150 Mg Tabs (Fluconazole) .Marland Kitchen.. 1 once daily once per episode of yeast infection 18)  Fluticasone Propionate 50 Mcg/act Susp (Fluticasone propionate) .... 2 sprays each nostril once daily 19)  Levaquin 500 Mg Tabs (Levofloxacin) .Marland Kitchen.. 1 by mouth qd 20)  Transderm-scop 1.5 Mg Pt72 (Scopolamine base) .... Change q 3 d  Patient Instructions: 1)  Vesicare 5 mg a day as needed 2)  Get Immodium AD for diarrhea as needed Prescriptions: TRANSDERM-SCOP 1.5 MG PT72 (SCOPOLAMINE BASE) change q 3 d  #5 x 1   Entered and Authorized by:   Tresa Garter MD   Signed by:   Tresa Garter MD on 10/03/2009   Method used:   Print then Give  to Patient   RxID:   [id number] LEVAQUIN 500 MG TABS (LEVOFLOXACIN) 1 by mouth qd  #10 x 0   Entered and Authorized by:   Tresa Garter MD   Signed by:   Tresa Garter MD on 10/03/2009   Method used:   Print then Give to Patient   RxID:   [id number]

## 2010-06-26 NOTE — Progress Notes (Signed)
Summary: Talk to nurse  Phone Note Call from Patient Call back at Home Phone [phone]   Call For: Dr Jarold Motto Summary of Call: Talked to a Dr in [place] that trained Dr Jarold Motto. He is a speciallist in pancreas. Would rather this discuss with nurse. Initial call taken by: Leanor Kail [facility],  June 09, 2009 12:55 PM  Follow-up for Phone Call        Pt talked with Dr. Carie Caddy from [place] re clinical studies that are being preformed for chronic pancreatitis.  This doctor would like to review pt records. Pt is interested in being a study.  Pt would like to pick up copies of records and she will send them to Dr. Carie Caddy.  Pt also having problems with bowels.  OV scheduled to assess bowel problem.  Old chart ordered for records to be copied.  Pt wants to make sure it is OK with Dr. Jarold Motto if she looks into being in a clinical study. Follow-up by: Ashok Cordia RN,  June 09, 2009 2:00 PM  Additional Follow-up for Phone Call Additional follow up Details #1::        This is 100% okay,,, I did my fellowship with Dr. Carie Caddy... Additional Follow-up by: Mardella Layman MD FACG,  June 09, 2009 2:03 PM    Additional Follow-up for Phone Call Additional follow up Details #2::    Pt notified.  Will copy records and have pt pick up when ready.  paper chart ordered. Follow-up by: Ashok Cordia RN,  June 09, 2009 3:25 PM

## 2010-06-26 NOTE — Procedures (Signed)
Summary: Endoscopic Ultrasound  Patient: [name] Note: All result statuses are Final unless otherwise noted.  Tests: (1) Endoscopic Ultrasound (EUS)  EUS Endoscopic Ultrasound                             DONE     [facility]     [address] [place], [place]  [address]           ENDOSCOPIC ULTRASOUND PROCEDURE REPORT           PATIENT:  [name], [name]  MR#:  [id number]     BIRTHDATE:  Dec 25, 1940  GENDER:  female     ENDOSCOPIST:  Rachael Fee, MD     REFERRED BY:  Vania Rea. Jarold Motto, M.D.     PROCEDURE DATE:  01/25/2010     PROCEDURE:  Upper EUS     ASA CLASS:  Class II     INDICATIONS:  abnormal pancreas on 2010 CT scan     MEDICATIONS:  Fentanyl 75 mcg IV, Versed 6 mg IV           DESCRIPTION OF PROCEDURE:   After the risks benefits and     alternatives of the procedure were  explained, informed consent     was obtained. The patient was then placed in the left, lateral,     decubitus postion and IV sedation was administered. Throughout the     procedure, the patient's blood pressure, pulse and oxygen     saturations were monitored continuously.  Under direct     visualization, the  endoscope was introduced through the mouth and     advanced to the duodenum.  Water was used as necessary to provide     an acoustic interface.  Upon completion of the imaging, water was     removed and the patient was sent to the recovery room in     satisfactory condition.     <<PROCEDUREIMAGES>>           Endoscopic findings:     1. Normal esophagus, stomach, duodenum           EUS findings:     1. Pancreatic head was somewhat enlarged but otherwise normal     appearing.     2. Parenchyma throughout the gland was normal appearing: no     discrete masses or signs of chronic pancreatitis.     3. Main pancreatic duct was normal throughout head, neck, body but     was slightly ectatic appearing in the tail.  Wall of main     pancreatic duct was normal, not hyperechoic  appearing.     4. No peripancreatic adenopathy.     5. CBD was normal; non-dilated and no filling defects           Impression:     Ectatic pancreatic duct in tail of gland and slightly enlarged     pancreatic head.  Otherwise the pancreas is normal without any     clear ductal or parenchymal signs of chronic pancreatitis.           ______________________________     Rachael Fee, MD           n.     eSIGNED:   Rachael Fee at 01/25/2010 02:30 PM           Alinda Money, [phone]  Note: An exclamation mark Marland Kitchen)  indicates a result that was not dispersed into the flowsheet. Document Creation Date: 01/25/2010 2:32 PM _______________________________________________________________________  (1) Order result status: Final Collection or observation date-time: 01/25/2010 14:21 Requested date-time:  Receipt date-time:  Reported date-time:  Referring Physician:   Ordering Physician: Rob Bunting [phone]) Specimen Source:  Source: Launa Grill Order Number: [id number] Lab site:

## 2010-06-26 NOTE — Progress Notes (Signed)
Summary: prep ?'s  Phone Note Call from Patient Call back at Home Phone [phone]   Caller: Patient Call For: Dr. Jarold Motto Reason for Call: Talk to Nurse Summary of Call: prep ?'s regarding medications Initial call taken by: Vallarie Mare,  January 22, 2010 10:00 AM  Follow-up for Phone Call        Pt asking if seh needs to leave off any of the medications.  Could not remember what she was told.  Reviewed pt's medications and checked with Patty.  Pt may cont all medications.  Pt notified. Follow-up by: Ashok Cordia RN,  January 22, 2010 10:48 AM

## 2010-06-26 NOTE — Assessment & Plan Note (Signed)
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 She had been completely asymptomatic for many years on p.o. Viokase 16 tablets a day until this was removed from the market by the FDA. Since that time, she has been on Creon tablets 2 tablets 3 times a day and continued with a mild chronic left upper quadrant pain, gas and bloating. She now has liquid stools and occasional fecal incontinence without rectal bleeding. She does have some mild anal discomfort. She has been on Levaquin in October for URI but otherwise has had no antibiotic exposure. She does take daily Lexapro, vitamin D, aspirin, and probiotics. She is up-to-date on her colonoscopy and endoscopy exams.  Patient travels extensively and denies systemic complaints, anorexia or weight loss. She is on daily AcipHex for chronic GERD. Family history noncontributory. She does have a history of penicillin allergy.   GI Review of Systems     Location of  Abdominal pain: LUQ.     Reports change in bowel habits, diarrhea, fecal incontinence, and  rectal pain.      Current Medications (verified): 1)  Creon 24000 Unit Cpep (Pancrelipase (Lip-Prot-Amyl)) .... Take 6 Tabs By Mouth Three Times A Day 2)  Lexapro 10 Mg  Tabs (Escitalopram Oxalate) .... 1/2 Once Daily 3)  Zyrtec Allergy 10 Mg  Tabs (Cetirizine Hcl) .... Once Daily As Needed 4)  Vivelle-Dot 0.025 Mg/24hr  Pttw (Estradiol) .Marland Kitchen.. 1 Weekly 5)  Vitamin D3 1000 Unit  Tabs (Cholecalciferol) .Marland Kitchen.. 1 Qd 6)  Transderm-Scop  1.5 Mg  Pt72 (Scopolamine Base) .... Change 1 Q 3 D As Needed 7)  Anusol-Hc 25 Mg  Supp (Hydrocortisone Acetate) .Marland Kitchen.. 1 Pr Two Times A Day Prn 8)  Multivitamins   Tabs (Multiple Vitamin) .... Once Daily 9)  Aspirin 325 Mg  Tabs (Aspirin) .... One Tablet Every Day 10)  Fish Oil   Oil (Fish Oil) .... One By Mouth Every Day 11)  Align   Caps (Misc Intestinal Flora Regulat) .... One Cap Every Day 12)  Levsin/sl 0.125 Mg Subl (Hyoscyamine Sulfate) .... Place 1 Tab Under Tongue As Needed For Cramping 13)  Aciphex 20 Mg Tbec (Rabeprazole Sodium) .... Take 1 Tab Twice Daily 14)  Vitamin C Cr 500 Mg Cr-Caps (Ascorbic Acid) .Marland Kitchen.. 1 By Mouth Once Daily 15)  Calcium-Vitamin D 500-200 Mg-Unit Tabs (Calcium Carbonate-Vitamin D) .Marland Kitchen.. 1 By Mouth Once Daily 16)  Xifaxan 550 Mg  Tabs (Rifaximin) .... Take One By Mouth Two Times A Day For 3 Days If Needed For Diarrhea While Traveling 17)  Diflucan 150 Mg Tabs (Fluconazole) .Marland Kitchen.. 1 Once Daily Once Per Episode of Yeast Infection 18)  Fluticasone Propionate 50 Mcg/act  Susp (Fluticasone Propionate) .... 2 Sprays Each Nostril Once Daily  Allergies (verified): 1)  ! Pcn 2)  ! Epinephrine 3)  ! Zithromax 4)  Cipro 5)  Amoxicillin (Amoxicillin) 6)  Amoxicillin (Amoxicillin) 7)  Amoxicillin (Amoxicillin) 8)  Twinject (Epinephrine) 9)  Twinject (Epinephrine) 10)  Twinject (Epinephrine) 11)  Sulfa  Past History:  Past medical, surgical, family and social histories (including risk  factors) reviewed for relevance to current acute and chronic problems.  Past Medical History: Reviewed history from 12/13/2008 and no changes required. Osteoarthritis GERD CHRONIC IDIOPATHIC PANCREATITIS Hemorrhoids  Past Surgical History: Reviewed history from 01/12/2008 and no changes required. Hysterectomy 1998  Family History: Reviewed history from 01/12/2008 and no changes required. Family History Hypertension No FH of Colon Cancer: bladder cancer/ father Family History  of Breast Cancer:aunt  Family History of Ovarian Cancer:aunt Family History of Pancreatic Cancer:aunt Family History of Stomach Cancer:aunt Family History of Uterine Cancer:grandmother  Social History: Reviewed history from 01/12/2008 and no changes required. Married Patient is a former smoker.  Alcohol Use - no Daily Caffeine Use  occ Illicit Drug Use - no Patient gets regular exercise.walks 5 miles four days a week and rides bike a day also lifts weights  Review of Systems       The patient complains of night sweats.  The patient denies allergy/sinus, anemia, anxiety-new, arthritis/joint pain, back pain, blood in urine, breast changes/lumps, change in vision, confusion, cough, coughing up blood, depression-new, fainting, fatigue, fever, headaches-new, hearing problems, heart murmur, heart rhythm changes, itching, muscle pains/cramps, nosebleeds, shortness of breath, skin rash, sleeping problems, sore throat, swelling of feet/legs, swollen lymph glands, thirst - excessive, urination - excessive, urination changes/pain, urine leakage, vision changes, and voice change.    Vital Signs:  Patient profile:   70 year old female Height:      65 inches Weight:      120 pounds BMI:     20.04 Pulse rate:   84 / minute Pulse rhythm:   regular BP sitting:   118 / 78  (left arm)  Vitals Entered By: Milford Cage NCMA (June 20, 2009 3:18 PM)  Physical Exam  General:  Well developed, well nourished, no acute distress.healthy appearing.   Head:  Normocephalic and atraumatic. Eyes:  PERRLA, no icterus.exam deferred to patient's ophthalmologist.   Lungs:  Clear throughout to auscultation. Heart:  Regular rate and rhythm; no murmurs, rubs,  or bruits. Abdomen:  Soft, nontender and nondistended. No masses, hepatosplenomegaly or hernias noted. Normal bowel sounds. Rectal:  There were perianal skin tags with local excoriation and perianal irritation but no definite fissures or fistulae.  Rectal exam shows no masses or tenderness with the liquidy stool which is guaiac negative. Extremities:  No clubbing, cyanosis, edema or deformities noted. Neurologic:  Alert and  oriented x4;  grossly normal neurologically. Inguinal Nodes:  No significant inguinal adenopathy. Psych:  Alert and cooperative. Normal mood and affect.   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 She has no history of inflammatory bowel disease. Her symptoms do not seem consistent with chronic malabsorption or C. difficile infection.She is to discontinue her ingestion of nonabsorbable carbohydrates and we will see how she does symptomatically. Other considerations would be idiopathic bacterial overgrowth syndrome associated with her chronic pancreatitis. Depending on her clinical response, she may need further evaluation and therapy.Locally she is to discontinue steroid creams around her rectum and to cleanse twice a day with Balneol solution followed by local Desitin cream.  Problem # 2:  CHRONIC PANCREATITIS (ICD-577.1) Assessment: Deteriorated She has been contacted by Dr. Othella Boyer Prof. of medicine and gastroenterology at [facility] [place] he is a world renowned expert in gastrointestinal diseases and chronic pancreatitis. Apparently there are plans for her to go into a clinical  research study where she hopefully can resume non-enteric-coated pancreatic enzymes which seem to have controlled her symptomatology in the past. For now, she is to continue her Creon and AcipHex.  Problem # 3:  GERD (ICD-530.81) Assessment: Improved Reflux regime and daily AcipHex 20 mg 30 minutes before breakfast as tolerated  Patient Instructions: 1)  Please continue current medications.  2)  Excessive Gas Diet handout given.  3)  Copy sent to : Dr. Othella Boyer at the  [facility] in [place]. 4)  Local anal care as outlined. 5)  Discontinue sorbitol-fructose and other nonabsorbable carbohydrates. 6)  Consider repeat flexible sigmoid exam depending on clinical response 7)  The medication list was reviewed and reconciled.  All changed / newly prescribed medications were explained.  A complete medication list was provided to the patient / caregiver.

## 2010-06-28 NOTE — Progress Notes (Signed)
Summary: Call Report  Phone Note Other Incoming   Caller: [facility] Summary of Call: [facility] Triage Call Report Triage Record Num: [id number] Operator: Merlinda Frederick Patient Name: [name] Call Date & Time: 05/19/2010 4:53:56PM Patient Phone: [phone] PCP: Sonda Primes Patient Gender: Female PCP Fax : [phone] Patient DOB: [birth year] Practice Name: [facility] Reason for Call: Pt calling 05/19/10 about urninary frequency, blood in urine. Onset 05/18/10. Pt taking Cystex as directed OTC. Afebrile. Per Bloody Urine Guideline, advised appt within 24 hrs. Pt will go to [facility] tonight for further evaluation. Protocol(s) Used: Bloody Urine Protocol(s) Used: Urinary Symptoms - Female Recommended Outcome per Protocol: See Provider within 24 hours Reason for Outcome: Blood in urine Has one or more urinary tract symptoms Care Advice:  ~ 12/ Initial call taken by: Margaret Pyle, CMA,  May 22, 2010 11:03 AM

## 2010-07-12 NOTE — Letter (Signed)
Summary: Ovidio Kin MD/[facility]  Ovidio Kin MD/[facility]   Imported By: Lester Fallis 07/03/2010 10:56:16  _____________________________________________________________________  External Attachment:    Type:   Image     Comment:   External Document

## 2010-08-06 LAB — POCT URINALYSIS DIPSTICK
Bilirubin Urine: NEGATIVE
Ketones, ur: NEGATIVE mg/dL
Nitrite: NEGATIVE
Protein, ur: 30 mg/dL — AB
pH: 6 (ref 5.0–8.0)

## 2010-08-06 LAB — URINE CULTURE: Culture  Setup Time: 201112250037

## 2010-09-02 LAB — CBC
HCT: 36.8 % (ref 36.0–46.0)
HCT: 40.7 % (ref 36.0–46.0)
Hemoglobin: 12.5 g/dL (ref 12.0–15.0)
Hemoglobin: 13.9 g/dL (ref 12.0–15.0)
MCV: 93.9 fL (ref 78.0–100.0)
MCV: 94 fL (ref 78.0–100.0)
Platelets: 230 10*3/uL (ref 150–400)
WBC: 11.7 10*3/uL — ABNORMAL HIGH (ref 4.0–10.5)
WBC: 7 10*3/uL (ref 4.0–10.5)

## 2010-09-02 LAB — BASIC METABOLIC PANEL
BUN: 15 mg/dL (ref 6–23)
CO2: 30 mEq/L (ref 19–32)
Calcium: 8.4 mg/dL (ref 8.4–10.5)
Chloride: 107 mEq/L (ref 96–112)
Creatinine, Ser: 0.77 mg/dL (ref 0.4–1.2)
GFR calc Af Amer: >60 mL/min (ref >60)

## 2010-09-02 LAB — COMPREHENSIVE METABOLIC PANEL
ALT: 34 U/L (ref 0–35)
AST: 64 U/L — ABNORMAL HIGH (ref 0–37)
Albumin: 3.4 g/dL — ABNORMAL LOW (ref 3.5–5.2)
Alkaline Phosphatase: 63 U/L (ref 39–117)
CO2: 30 mEq/L (ref 19–32)
Chloride: 105 mEq/L (ref 96–112)
Creatinine, Ser: 0.88 mg/dL (ref 0.4–1.2)
GFR calc Af Amer: >60 mL/min (ref >60)
GFR calc non Af Amer: >60 mL/min (ref >60)
Potassium: 4.3 mEq/L (ref 3.5–5.1)
Sodium: 139 mEq/L (ref 135–145)
Total Bilirubin: 0.4 mg/dL (ref 0.3–1.2)

## 2010-09-02 LAB — POCT CARDIAC MARKERS
CKMB, poc: <1 ng/mL — ABNORMAL LOW (ref 1.0–8.0)
Myoglobin, poc: 56.4 ng/mL (ref 12–200)
Troponin i, poc: <0.05 ng/mL (ref 0.00–0.09)

## 2010-09-02 LAB — DIFFERENTIAL
Eosinophils Absolute: 0.6 10*3/uL (ref 0.0–0.7)
Eosinophils Relative: 9 % — ABNORMAL HIGH (ref 0–5)
Lymphocytes Relative: 23 % (ref 12–46)
Lymphs Abs: 1.6 10*3/uL (ref 0.7–4.0)
Monocytes Absolute: 0.5 10*3/uL (ref 0.1–1.0)
Monocytes Relative: 7 % (ref 3–12)

## 2010-09-02 LAB — URINALYSIS, ROUTINE W REFLEX MICROSCOPIC
Glucose, UA: NEGATIVE mg/dL
Hgb urine dipstick: NEGATIVE
Protein, ur: NEGATIVE mg/dL
Specific Gravity, Urine: 1.008 (ref 1.005–1.030)

## 2010-09-02 LAB — AMYLASE: Amylase: 121 U/L (ref 27–131)

## 2010-09-02 LAB — CARDIAC PANEL(CRET KIN+CKTOT+MB+TROPI)
CK, MB: 1.4 ng/mL (ref 0.3–4.0)
Troponin I: 0.01 ng/mL (ref 0.00–0.06)

## 2010-09-02 LAB — URINE MICROSCOPIC-ADD ON

## 2010-09-02 LAB — LIPID PANEL
Cholesterol: 129 mg/dL (ref 0–200)
Cholesterol: 146 mg/dL (ref 0–200)
HDL: 84 mg/dL (ref >39)
HDL: 95 mg/dL (ref >39)
Total CHOL/HDL Ratio: 1.5 RATIO
Total CHOL/HDL Ratio: 1.5 RATIO
Triglycerides: 39 mg/dL (ref <150)
VLDL: 2 mg/dL (ref 0–40)

## 2010-09-02 LAB — PROTIME-INR: INR: 1 (ref 0.00–1.49)

## 2010-09-02 LAB — LIPASE, BLOOD: Lipase: 56 U/L (ref 11–59)

## 2010-09-02 LAB — APTT: aPTT: 25 seconds (ref 24–37)

## 2010-09-02 LAB — CK TOTAL AND CKMB (NOT AT ARMC)
Relative Index: INVALID (ref 0.0–2.5)
Total CK: 51 U/L (ref 7–177)

## 2010-09-06 ENCOUNTER — Telehealth: Payer: Self-pay | Admitting: Gastroenterology

## 2010-09-06 ENCOUNTER — Other Ambulatory Visit: Payer: Self-pay | Admitting: Gastroenterology

## 2010-09-06 ENCOUNTER — Other Ambulatory Visit (INDEPENDENT_AMBULATORY_CARE_PROVIDER_SITE_OTHER): Payer: Self-pay

## 2010-09-06 DIAGNOSIS — K859 Acute pancreatitis without necrosis or infection, unspecified: Secondary | ICD-10-CM

## 2010-09-06 DIAGNOSIS — Z8719 Personal history of other diseases of the digestive system: Secondary | ICD-10-CM

## 2010-09-06 DIAGNOSIS — R109 Unspecified abdominal pain: Secondary | ICD-10-CM

## 2010-09-06 LAB — CBC WITH DIFFERENTIAL/PLATELET
Basophils Relative: 0.5 % (ref 0.0–3.0)
Eosinophils Absolute: 0.4 10*3/uL (ref 0.0–0.7)
Lymphs Abs: 1.8 10*3/uL (ref 0.7–4.0)
MCHC: 34.4 g/dL (ref 30.0–36.0)
MCV: 94.8 fl (ref 78.0–100.0)
Monocytes Absolute: 0.5 10*3/uL (ref 0.1–1.0)
Neutrophils Relative %: 61.9 % (ref 43.0–77.0)
Platelets: 228 10*3/uL (ref 150.0–400.0)

## 2010-09-06 LAB — TSH: TSH: 1.98 u[IU]/mL (ref 0.35–5.50)

## 2010-09-06 LAB — HEPATIC FUNCTION PANEL
ALT: 46 U/L — ABNORMAL HIGH (ref 0–35)
AST: 39 U/L — ABNORMAL HIGH (ref 0–37)
Alkaline Phosphatase: 64 U/L (ref 39–117)
Bilirubin, Direct: 0.1 mg/dL (ref 0.0–0.3)
Total Bilirubin: 0.6 mg/dL (ref 0.3–1.2)

## 2010-09-06 LAB — BASIC METABOLIC PANEL
BUN: 16 mg/dL (ref 6–23)
Chloride: 101 mEq/L (ref 96–112)
Creatinine, Ser: 1 mg/dL (ref 0.4–1.2)
Glucose, Bld: 86 mg/dL (ref 70–99)

## 2010-09-06 LAB — AMYLASE: Amylase: 203 U/L — ABNORMAL HIGH (ref 27–131)

## 2010-09-06 NOTE — Telephone Encounter (Signed)
Pt with hx of Pancreatitis, Reflux. Last OV 01/09/10; last EUS 01/25/10. Pt reports an episode of pain last pm during and after dinner and she eventually threw up. She reports this is the 3rd episode in the past month where she has pain in her esophagus that travels down into her chest and then radiates to her back. She took [employer] and TUMS with some relief, then she threw up and the pain returned.  Pt has now pain this am, but reports tenderness in the same areas- but, she hasn't eaten. Dr Jarold Motto, you nor Gunnar Fusi have any openings; would you like to order labs or anything? Thanks.

## 2010-09-06 NOTE — Telephone Encounter (Signed)
See me tomorrow. Check lumbar profile, CBC, amylase and lipase today.

## 2010-09-06 NOTE — Telephone Encounter (Signed)
ADD TO SEE ME TOMORROW.. AMYLASE ,LIPASE,CBC AND [place] MET PROFILE TODAY

## 2010-09-06 NOTE — Telephone Encounter (Signed)
Notified pt she needs to come in today for labs and for her appt with Dr Jarold Motto in am at 11:15am. Pt verbalized understanding.

## 2010-09-07 ENCOUNTER — Other Ambulatory Visit: Payer: Self-pay | Admitting: Gastroenterology

## 2010-09-07 ENCOUNTER — Ambulatory Visit (INDEPENDENT_AMBULATORY_CARE_PROVIDER_SITE_OTHER): Payer: Medicare Other | Admitting: Gastroenterology

## 2010-09-07 ENCOUNTER — Other Ambulatory Visit: Payer: Medicare Other

## 2010-09-07 ENCOUNTER — Encounter: Payer: Self-pay | Admitting: Gastroenterology

## 2010-09-07 DIAGNOSIS — K8689 Other specified diseases of pancreas: Secondary | ICD-10-CM

## 2010-09-07 DIAGNOSIS — K861 Other chronic pancreatitis: Secondary | ICD-10-CM

## 2010-09-07 DIAGNOSIS — K589 Irritable bowel syndrome without diarrhea: Secondary | ICD-10-CM

## 2010-09-07 DIAGNOSIS — K219 Gastro-esophageal reflux disease without esophagitis: Secondary | ICD-10-CM

## 2010-09-07 DIAGNOSIS — K859 Acute pancreatitis without necrosis or infection, unspecified: Secondary | ICD-10-CM

## 2010-09-07 DIAGNOSIS — R1013 Epigastric pain: Secondary | ICD-10-CM

## 2010-09-07 MED ORDER — TAPENTADOL HCL 50 MG PO TABS: 50.0000 mg | ORAL_TABLET | Freq: Four times a day (QID) | ORAL | Status: AC | PRN

## 2010-09-07 NOTE — Patient Instructions (Signed)
Please go to the basement today for your labs.  We have printed your pain medication and gave it to you in the office today. Make an office visit to come back and see Dr Jarold Motto in one month.

## 2010-09-07 NOTE — Progress Notes (Signed)
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 Done well with pancreatic extracts and has had multiple imaging studies of her pancreas. She now complains of intermittent epigastric pain with nausea, worsening acid reflux symptoms, and pain that radiates into her back. Lab data was performed shows a mild pancreatitis with a slightly elevated amylase, lipase, and liver function tests. He is status post cholecystectomy. She also  worsening of her GERD despite Protonix 40 mg twice a day. There is no history of alcohol use, hyperlipidemia, or hypercalcemia. Current Medications, Allergies, Past Medical History, Past Surgical History, Family History and Social History were reviewed in [employer] record.  Pertinent Review of Systems Negative   Physical Exam: Awake alert no acute distress appears stated age. Her chest is clear no murmurs gallops or rubs noted. Abdominal exam is entirely benign without organomegaly, masses or tenderness. Bowel sounds are normal. Peripheral extremities are unremarkable, and mental status is normal.    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 I have ordered serum IgG-and ANA. He may need a brief course of tapering steroid medications. For now, she is to continue her pancreatic extracts, PPI therapy, and I prescribed Nucynta 50 mg every 8-12 hours as needed for abdominal pain. Also has symptoms consistent with irritable bowel syndrome. She is to return to see me in one month's time or when necessary as needed. Encounter Diagnoses  Name Primary?  . Abdominal pain, epigastric   . Acute pancreatitis    Please copy her primary care physician, referring physician, and pertinent subspecialists.

## 2010-09-10 LAB — ANA: Anti Nuclear Antibody(ANA): NEGATIVE

## 2010-09-12 ENCOUNTER — Telehealth: Payer: Self-pay | Admitting: *Deleted

## 2010-09-12 LAB — IGG 1, 2, 3, AND 4
IgG Subclass 2: 221 mg/dL — ABNORMAL LOW (ref 241–700)
IgG Subclass 3: 66 mg/dL (ref 22–178)
IgG Subclass 4: 108 mg/dL — ABNORMAL HIGH (ref 4.0–86.0)

## 2010-09-12 MED ORDER — PREDNISONE 20 MG PO TABS: 40.0000 mg | ORAL_TABLET | Freq: Every day | ORAL | Status: DC

## 2010-09-12 NOTE — Telephone Encounter (Signed)
F/U to last letter. Dr Jarold Motto, pt called back to report she looked up Autoimmune Hep. On the internet and it "looks like a long drawn out thing". She is scheduled to go to [place] in June; last day to sign up is today. She does have insurance if she needs to cancel if you write a letter. Do you think she needs to postpone her trip or wait? Again, is she contagious? Thanks.

## 2010-09-12 NOTE — Telephone Encounter (Signed)
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 I was unable to answer her questions and informed her she didn't "catch" the Hepatitis- it's autoimmune. Dr Jarold Motto will discuss this at OV. Pt would like to know , IS SHE CONTAGIOUS?      She has grandchildren

## 2010-09-12 NOTE — Telephone Encounter (Signed)
Pancreatitis---not hepatitis.Marland Kitchenno med needed if she is better

## 2010-09-12 NOTE — Telephone Encounter (Signed)
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

## 2010-09-12 NOTE — Telephone Encounter (Signed)
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 She is not contagious. Pt verbalized understanding.

## 2010-09-12 NOTE — Telephone Encounter (Signed)
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

## 2010-09-12 NOTE — Telephone Encounter (Signed)
LMOM for pt to call back.

## 2010-09-27 ENCOUNTER — Encounter: Payer: Self-pay | Admitting: Gastroenterology

## 2010-09-27 ENCOUNTER — Ambulatory Visit (INDEPENDENT_AMBULATORY_CARE_PROVIDER_SITE_OTHER): Payer: Medicare Other | Admitting: Gastroenterology

## 2010-09-27 VITALS — BP 118/64 | HR 80 | Ht 65.0 in | Wt 123.0 lb

## 2010-09-27 DIAGNOSIS — K219 Gastro-esophageal reflux disease without esophagitis: Secondary | ICD-10-CM | POA: Insufficient documentation

## 2010-09-27 DIAGNOSIS — K589 Irritable bowel syndrome without diarrhea: Secondary | ICD-10-CM | POA: Insufficient documentation

## 2010-09-27 DIAGNOSIS — K859 Acute pancreatitis without necrosis or infection, unspecified: Secondary | ICD-10-CM

## 2010-09-27 MED ORDER — ESCITALOPRAM OXALATE 5 MG PO TABS: 5.0000 mg | ORAL_TABLET | Freq: Every day | ORAL | Status: DC

## 2010-09-27 MED ORDER — PREDNISONE 20 MG PO TABS: ORAL_TABLET | ORAL | Status: DC

## 2010-09-27 NOTE — Progress Notes (Signed)
History of Present Illness: This is a 70 year old Caucasian female has a long history of small duct pancreatitis previously treated with Po pancreatic extract. These were removed from the pharmaceutical market, she now is on Creon tablets, PPI therapy for GERD, and when necessary Antispas moderate. She recently has been diagnosed with" autoimmune pancreatitis" per elevated IgG for levels. Also amylase and lipase were mildly elevated. She has had multiple imaging studies of her pancreas which have confirmed chronic pancreatitis but no evidence of pancreatic cancer or stricturing of her common bile duct. This been on prednisone 40 mg a day she is completely asymptomatic with no dominant pain, gas or bloating. She does occasionally have esophageal spasm relieved by sublingual Levsin. The patient has had anxiety and some mental status changes on prednisone, and they seemed to respond nicely to Lexapro 5 mg a day which I have removed. She does not wish any benzodiazepine therapy. She is going on a prolonged trip abroad at the end of June of this year. We'll provide her with medications for this trip including prednisone if needed.   Current Medications, Allergies, Past Medical History, Past Surgical History, Family History and Social History were reviewed in [employer] record.   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 I will taper her prednisone by 10 mg every 10 days as tolerated with office followup in 4-6 weeks. She otherwise should continue all her medications as listed above. Detailed pertinent information concerning autoimmune pancreatitis has been provided to the patient for edification.  Please copy her primary care physician, referring physician, and pertinent subspecialists.     Encounter Diagnosis  Name Primary?  . Pancreatitis Yes

## 2010-09-27 NOTE — Patient Instructions (Addendum)
Taper your Prednisone per your medication list.  Your prescription(s) have been sent to you pharmacy.  Make an appt to come back and see Dr. Jarold Motto in [facility] June.

## 2010-10-02 ENCOUNTER — Telehealth (INDEPENDENT_AMBULATORY_CARE_PROVIDER_SITE_OTHER): Payer: Medicare Other | Admitting: Gastroenterology

## 2010-10-02 DIAGNOSIS — K859 Acute pancreatitis without necrosis or infection, unspecified: Secondary | ICD-10-CM

## 2010-10-02 MED ORDER — PREDNISONE 10 MG PO TABS: ORAL_TABLET | ORAL | Status: DC

## 2010-10-02 NOTE — Telephone Encounter (Signed)
Pt called to report she is having pain again and wonders if she decreased the prednisone too fast. Last OV 09/27/10, pt was on Prednisone 40mg /day- she had been on the 40mg  for about 2 weeks. At that visit she was to decrease the Prednisone to 30mg /day. Pt stated the article you gave her to read and the [facility] site on the internet , suggested: initiate prednisone at 40mg /day for 4-6 weeks and taper by 5mg  weekly .  Please advise. Thanks.

## 2010-10-02 NOTE — Telephone Encounter (Signed)
Explained to pt Dr Jarold Motto is ok with her tapering the Prednisone to relieve her symptoms. Pt will keep [place] informed of her dosage. She will increase back up to 40mg  x 2 weeks and if tolerated, she will decrease to 35mg  x 2 weeks, etc. Pt would like 10mg  Prednisone tabs called to [facility]- done. Pt will call for worsening problems.

## 2010-10-02 NOTE — Telephone Encounter (Signed)
????.....  taper slowly as tolerated.Marland Kitchen

## 2010-10-05 ENCOUNTER — Telehealth: Payer: Self-pay | Admitting: Gastroenterology

## 2010-10-05 NOTE — Telephone Encounter (Signed)
LMOM that I received her message and I will forward it to Dr Jarold Motto- glad you're feeling better.

## 2010-10-09 ENCOUNTER — Ambulatory Visit: Payer: Medicare Other | Admitting: Gastroenterology

## 2010-10-09 NOTE — Assessment & Plan Note (Signed)
[facility]                         ELECTROPHYSIOLOGY OFFICE NOTE   NAME:Scheffel, YARETSI HUMPHRES                   MRN:          [id number]  DATE:02/17/2007                            DOB:          01-Jul-1940    The patient is a 70 year old lady who returns for orthostatic  hypotension and palpitations thought to be related to atrial  tachycardia.  These have been relatively quiescent.  She uses caffeine  some, but not excessively.   CURRENT MEDICATIONS:  1. Lexapro 5 mg.  2. Zyrtec 10 mg.  3. Aciphex.  4. Variety of vitamins.   PHYSICAL EXAMINATION:  VITAL SIGNS:  Blood pressure 119/79, pulse 78.  LUNGS:  Clear.  HEART:  Sounds were regular.  EXTREMITIES:  Without edema.  SKIN:  Warm and dry.   Electrocardiogram dated today demonstrated sinus rhythm at 78 with  intervals of 0.13, 0.09, 0.39, the axis was 85 degrees.  The  electrocardiogram was otherwise normal.   IMPRESSION:  1. Orthostatic hypotension.  2. Palpitations consistent with atrial tachycardia.  3. Propensity towards hypertension, currently quiet.   The patient is stable currently.  We will plan to see her again in one  years' time.     Duke Salvia, MD, [facility]  Electronically Signed    SCK/MedQ  DD: 02/17/2007  DT: 02/18/2007  Job #: [id number]

## 2010-10-09 NOTE — Assessment & Plan Note (Signed)
[facility]                         GASTROENTEROLOGY OFFICE NOTE   NAME:[name], [name]                   MRN:          [id number]  DATE:01/06/2007                            DOB:          [birth year]    [name] is a 70 year old white female with chronic idiopathic  pancreatitis and so called small duct disease.  She has been managed  well with pancreatic extracts in the form of Viokase 6 tablets three  times a day, along with daily Nexium.  She has been treated in the past  for bacterial overgrowth syndrome empirically, without improvement in  her gas and bloating.  Having regular bowel movements.  Denies melena or  hematochezia and recently had negative heme-occult cards in Dr. Cordelia Pen  Dickstein's office.  She does have occasional acid reflux and uses  p.r.n. antacids.  In addition to the above medication, she takes Lexapro  5 mg a day, aspirin 325 mg a day, calcium with vitamin D three time a  day, and Zyrtec daily.  IN THE PAST, SHE HAS HAD REACTIONS TO PENICILLIN  AND EPINEPHRINE.   The patient is up to date on her colonoscopy and in the past has had  negative ultrasound three years ago.  She had some periodic abnormal  liver function tests in the past with mild hyper-transaminasemia and had  a completely negative hepatic workup.  She continues to eat well but has  had some slight weight loss recently.  She denied an systemic  complaints.  She is followed, cardiology standpoint, by Dr. Graciela Husbands and  has a history of dysautonomia and orthostatic intolerance.  She recently  was treated this spring for a urinary tract infection by Dr. Posey Rea.   PHYSICAL EXAMINATION:  GENERAL:  She is a healthy-appearing white female  in no distress.  I can not appreciate stigmata of chronic liver disease.  VITAL SIGNS:  He weighs 122 pounds and blood pressure is 120/78.  Pulse  was 72 and regular.  CHEST:  Clear, and she appeared to be in a regular rhythm  without  murmurs, gallops or rubs.  ABDOMEN:  I could not appreciate hepatosplenomegaly, abdominal masses or  tenderness.  Bowel sounds were normal.  EXTREMITIES:  Peripheral extremities were unremarkable.  MENTAL STATUS:  Normal.  RECTAL:  Deferred.   ASSESSMENT:  1. Chronic idiopathic pancreatitis, doing fairly well on pancreatic      extract therapy and acid supressive therapy.  2. Suspected element of chronic bacterial overgrowth syndrome - rule      out celiac disease.  3. Status post cholecystectomy.  4. History of dysautonomia followed by Dr. Graciela Husbands.  5. History of osteopenia, on calcium and vitamin D replacement.   RECOMMENDATIONS:  1. Repeat screening laboratory parameters including serum trypsin,      amylase, lipase, serum carotene and sprue panel.  2. Renew all medications at listed above.  3. Follow-up ultrasound exam.  4. Yearly gastrointestinal followup, unless otherwise indicated, with      primary care followup with Dr. Posey Rea.     Vania Rea. Jarold Motto, MD, [facility], F[facility]  Electronically Signed  DRP/MedQ  DD: 01/06/2007  DT: 01/07/2007  Job #: [id number]   cc:   Sherry A. Rosalio Macadamia, M.D.  Georgina Quint. Plotnikov, MD  Duke Salvia, MD, [facility]

## 2010-10-09 NOTE — Assessment & Plan Note (Signed)
[facility]                         ELECTROPHYSIOLOGY OFFICE NOTE   NAME:[name], [name]                   MRN:          [id number]  DATE:02/17/2008                            DOB:          1941-02-11    [name] was seen in followup for orthostatic intolerance and  palpitations, thought to be related to atrial tachycardia.  The  orthostatic symptoms have been recurring.  They are primarily associated  with working outside again up from the sofa quickly.  Her diet is  notable for some a.m. caffeine.  She tries to maintain her volume status  with modest degree of success; salt repletion has been something she has  avoided.   Her medications include Lexapro at 5 which has been used for night  sweats.  She has Viokase for her pancreatitis, as well as Aciphex.   On examination, her blood pressure today was 128/82, her pulse was 70,  her weight was 126.  Her lungs were clear.  Her heart sounds were  regular.  The extremities were without edema.  Skin was warm and dry.  She is in no acute distress.   Electrocardiogram was normal.   IMPRESSION:  1. Orthostatic intolerance.  2. Palpitations associated with atrial tachycardia.   We reviewed a variety of maneuvers that might be helped to abrogate her  symptoms, specifically isometric activities of her arms and her legs  depending on her position prior to standing.  She is again encouraged to  eat salt and to avoid caffeine.   We will plan to see her again in 1 year's time.     Duke Salvia, MD, [facility]  Electronically Signed    SCK/MedQ  DD: 02/17/2008  DT: 02/18/2008  Job #: [id number]

## 2010-10-09 NOTE — H&P (Signed)
[name], CHANDRAN                 ACCOUNT NO.:  [id number]   MEDICAL RECORD NO.:  [id number]          PATIENT TYPE:  INP   LOCATION:  5506                         FACILITY:  [facility]   PHYSICIAN:  Monique Crater, MD         DATE OF BIRTH:  [birth year]   DATE OF ADMISSION:  12/04/2008  DATE OF DISCHARGE:                              HISTORY & PHYSICAL   PRIMARY CARE PHYSICIAN:  Dr. Posey Rea.   CHIEF COMPLAINT:  Chest pain.   HISTORY OF PRESENT ILLNESS:  [name] is a 70 year old Caucasian female  with a history of chronic pancreatitis, on supplements, and  gastroesophageal reflux disease.  She is brought to the ER by EMS after  she presented to an urgent care center with sudden onset chest pain.  [name] reported that she had been church earlier today and was at  lunch with some of her friends, had a hamburger with fries and then  experienced a sudden onset of substernal chest pain.  She describes the  pain as a heaviness in the chest.  It lasted for about 10-15 minutes.  The pain was 10/10 in intensity and it was radiating to the back and  above the shoulders.  She also experienced numbness in bilateral upper  extremities.  She was diaphoretic at the time and also had  lightheadedness.  She denies any associated shortness of breath.  The  pain is not pleuritic, it is not positional and is not reproducible with  palpation.  She denies having any cough or fever.  There is no long  distance travel recently and there are nil flu-like symptoms in the  preceding 2 weeks.  She tried taking two Tums at the restaurant and had  two Beano before the meal but the Tums did not help her in any way.  When she presented to the urgent care center she was given a GI cocktail  and she had a mild relief of the symptoms after some time.  The patient  never had similar episodes in the past and she has had two stress tests  in the past, the last one about 5 years ago and, reportedly, they are  normal.  We  do not have any records for this.  She also had an EGD done  by Dr. Jarold [name] in the past and reportedly it was normal.  She has been  having problems with her acid reflux and she takes Aciphex every day,  but she states that this pain is not similar to her heartburn.  An  electrocardiogram was done in the emergency room, did not reveal any  acute changes indicating a myocardial infarction and a set of cardiac  enzymes were obtained and they were in normal limits.   REVIEW OF SYSTEMS:  A complete review of systems was done which included  general, head, eyes, ears, nose, throat, cardiovascular, respiratory,  GI, GU, endocrine, musculoskeletal, skin, neurological, psychiatric, all  are within normal limits other than what is mentioned above.   PAST MEDICAL HISTORY:  1. Chronic pancreatitis.  2. Gastroesophageal reflux disease.  3. Supraventricular tachycardia.  4. Major depressive disorder.   ALLERGIES:  1. PENICILLIN.  2. AZITHROMYCIN.  3. THE PATIENT CANNOT TAKE EPINEPHRINE, GIVEN HER SVT.   CURRENT MEDICATIONS AT HOME:  1. Aciphex 20 mg 1 tablet p.o. daily.  2. Aspirin 325 mg 1 tablet p.o. daily.  3. Vitamin C 500 mg once a day.  4. Vitamin D 1000 international units 1 tablet per day.  5. Multivitamin once a day.  6. Lexapro 20 mg 1/2 tablet once a day.  7. Zyrtec 1 per day.  8. Pancrelipase 6 tablets before meals t.i.d.  9. Climara transdermal patch 0.025 once weekly.  10.Align 1 per day.  11.Fish Oil 1000 mg 1 per day.   SOCIAL HISTORY:  She smoked during her college years, has never been an  active smoker, she has not smoked in the last 30 years.  There is no  history of alcohol or illicit drugs.  She currently lives with her  husband.   FAMILY HISTORY:  Her sister had rheumatic fever and passed away when she  was 70 years old.  There is a history of Alzheimer's disease and  diabetes mellitus in her Dad.  Her mother had CVA.  There is a strong  family history of  breast cancer on her maternal side.  Her father's side  has a significant history of cancers which include pancreatic, breast,  ovarian, skin, lung and stomach on her paternal side.   PHYSICAL EXAMINATION:  T-max 97.9 degrees Fahrenheit, pulse rate 69,  respirations 14, blood pressure 138/76 in the right upper extremity,  142/76 in the left upper extremity.  Oxygen saturation is 99% on room  air.  GENERAL APPEARANCE:  Not in acute distress.  Alert, awake, oriented  times 3, afebrile.  HEENT:  Normocephalic, atraumatic.  The pupils are equal and react to  light and accommodation.  The extraocular muscles are intact.  The  mucous membranes are moist.  NECK:  Supple.  No JVD, lymphadenopathy or carotid bruit.  CVS:  Regular in rhythm, rate is normal.  No murmurs, rubs or gallops.  LUNGS:  Clear to auscultation bilaterally.  ABDOMEN:  Soft, no tenderness appreciated.  No hepatosplenomegaly, no  ascites.  EXTREMITIES:  No clubbing, cyanosis or edema.  Peripheral pulses are  palpable bilaterally.  NEUROLOGICAL:  Grossly nonfocal.   LABS AND STUDIES:  CBC with diff:  WBC 11,700, hemoglobin 13.9,  hematocrit 40, platelets 230, INR 1.0, PT 13.4.  PTT 25.  Sodium 139,  potassium 4.3, chloride 105, bicarbonate 30.  BUN 14, creatinine 0.88,  glucose 122.  Total protein 5.8, albumin 3.4, ALT 34, AST 64, alkaline  phosphatase 63.  Serum lipase 83.  CK-MB less than 1, troponin less than  0.05, CK 56.  Electrocardiogram shows a normal sinus rhythm at the rate  of 78 bpm.  The axis is normal.  The PR and QT intervals are normal.  T-  wave inversions in lead 1, this is unchanged from her prior  electrocardiogram.  No ST changes.   ASSESSMENT AND PLAN:  1. Chest pain, atypical.  The patient does not have any risk factors      for heart disease.  She has a longstanding history of      gastroesophageal reflux disease with chronic pancreatitis.      Differentials include cardiac ischemia versus  esophagitis versus      pain from chronic pancreatitis.  We will cycle cardiac enzymes.  We  will get an exercise stress test in the morning to evaluate for      cardiac ischemia.  Her last stress test was more than 5 years ago.      Will start the patient on IV PPI twice a day.  Will continue to      supplement her pancreatic enzymes.  If the cardiac workup is      negative she might benefit from an upper endoscopy which could be      done as an outpatient.  2. Chronic pancreatitis.  Will continue her supplements.  3. Gastroesophageal reflux disease.  Continue proton pump inhibitor      intravenously.  4. Supraventricular tachycardia, the patient is currently in sinus      rhythm.  She will continue to follow with her EP as an outpatient.  5. Deep vein thrombosis prophylaxis with subcutaneous heparin.  6. Fluids, electrolytes and nutrition.  We will place a saline well.      Will replace the electrolytes as needed.  Will make the patient      nothing by mouth after midnight for the stress test.   DISPOSITION:  The patient will be admitted to the medical floor with  telemetry awaiting workup for cardiac ischemia.     ______________________________  Harland Dingwall, Dr.      Lucile Crater, MD  Electronically Signed    PG/MEDQ  D:  12/04/2008  T:  12/04/2008  Job:  [id number]

## 2010-10-09 NOTE — Assessment & Plan Note (Signed)
[facility]                                 ON-CALL NOTE   NAME:[name], Monique                          MRN:          [id number]  DATE:04/19/2007                            DOB:          [birth year]    ON CALL NOTE:  A 70 year old patient of Dr. Posey Rea and who is seeing  also, Dr. Jonny Ruiz. The patient states that she has had 1 day of  intermittent hematuria with frequency. No burning, back pain, fever, or  systemic symptoms. She noted some blood when she wiped after urination,  after church. She does not feel particularly ill. She has a remote  history of urinary tract infection and in the summer, had some hematuria  and was treated with Ciprofloxacin at that time. She does not know if a  urine culture was obtained. I have recommended that she can take  symptomatic treatment today but should probably have a urine culture or  urinalysils done and followup with someone in the clinic tomorrow  morning to assure the correct diagnosis and treatment. However, she  should call or seek care if she has fever or chills in the meantime.     Neta Mends. Panosh, MD  Electronically Signed    WKP/MedQ  DD: 04/19/2007  DT: 04/19/2007  Job #: [id number]

## 2010-10-09 NOTE — Assessment & Plan Note (Signed)
[facility]                         GASTROENTEROLOGY OFFICE NOTE   NAME:Probasco, JARICA PLASS                   MRN:          [id number]  DATE:06/23/2007                            DOB:          05/21/1941    [name] is a 70 year old female who is followed by myself for small-duct  chronic pancreatitis with associated abdominal pain.  This has been well  controlled with p.o. pancreatic extracts over the years.  She also has  orthostatic hypertension and palpitations with recurrent atrial  tachycardia followed by Dr. Berton Mount.   She presents today with a week-long history of burning pain in her mid  back area with some regurgitation, but no nausea and vomiting or  specific hepatobiliary complaints.  She is concerned because she is  going out of town on a cruise in the next couple of weeks.  She does  take Aciphex 20 mg a day since reporting acid reflux symptoms during a  clinic visit in August of 2008.  At that time, she also had ultrasound  of the abdomen performed which was entirely unremarkable including good  examination of the pancreas.  She is status post cholecystectomy.  Laboratory data at that time also was normal, including CBC, liver  function tests, but she does have a chronically mildly elevated amylase  of 157 and lipase of 60, normal up to 59.   Bay denies any current cardiovascular issues such as palpitations,  shortness of breath, or chest pain with exertion, cough, sputum  production, hemoptysis, etc.  She also denies true abdominal pain or  change in bowel habits, melena or hematochezia.  Her appetite is good  and her weight is stable.   PHYSICAL EXAMINATION:  She is a healthy-appearing white female, no acute  distress, appearing her stated age.  She weighs 123 pounds which is her normal weight, blood pressure 128/58,  and pulse was 76 and regular.  I could not appreciate stigmata of chronic liver disease.  Her chest was  entirely clear.  She was in a regular rhythm without murmurs, gallops, or rubs.  There was no hepatosplenomegaly, abdominal masses, or tenderness.  Bowel  sounds were normal.  Peripheral extremities were unremarkable.  Mental status was clear.   ASSESSMENT:  1. Atypical chest pain:  Probably related to acid reflux with      exacerbation for reasons which are unclear at this time.  2. History of chronic pancreatitis:  Doing well on pancreatic extract      therapy.  She is on Viokase 6 tablets 3 times a day.  3. History of atrial tachycardia and cardiac palpitations.  4. Chronic depression, on Lexapro therapy.  5. Osteoporosis with estrogen, calcium, and vitamin D therapy.      Previous celiac workup was negative.   RECOMMENDATIONS:  1. Repeat laboratory data including liver profile, amylase and lipase.  2. Strict antireflux regimen, increased Aciphex to 20 mg twice a day      30 minutes before breakfast and supper, plus Carafate suspension 1      tbs an hour after meals and at bedtime.  3. Have the patient  return in 1 week's time for followup.  If she      worsens before that time we will see her back with a more thorough      workup including repeat electrocardiogram and chest x-ray and      cardiac referral.     Vania Rea. Jarold Motto, MD, Caleen Essex, FAGA  Electronically Signed    DRP/MedQ  DD: 06/23/2007  DT: 06/24/2007  Job #: [id number]   cc:   Duke Salvia, MD, [facility]  Georgina Quint Plotnikov, MD

## 2010-10-09 NOTE — Discharge Summary (Signed)
[name], KROGH                 ACCOUNT NO.:  [id number]   MEDICAL RECORD NO.:  [id number]          PATIENT TYPE:  OBV   LOCATION:  5506                         FACILITY:  [facility]   PHYSICIAN:  Lonia Blood, M.D.       DATE OF BIRTH:  [birth year]   DATE OF ADMISSION:  12/04/2008  DATE OF DISCHARGE:  12/05/2008                               DISCHARGE SUMMARY   PRIMARY CARE PHYSICIAN:  Georgina Quint. Plotnikov, MD   DISCHARGE DIAGNOSES:  1. Chest pain - unclear etiology - low risk for coronary artery      disease - will follow up with [facility] for a stress test.  2. Gastroesophageal reflux disease.  3. Chronic pancreatitis.  4. History of supraventricular tachycardia.  5. Depression.   DISCHARGE MEDICATIONS:  1. Lexapro 2.5 mg daily.  2. Zyrtec 1 tablet daily.  3. Aciphex 20 mg daily.  4. Align 1 tablet daily.  5. Pancrease with each meal.  6. Fish oil daily.  7. Multivitamin tablet daily.  8. Calcium 3 times a day.  9. Vitamin C 1 tablet a day.  10.Vitamin D 1 tablet daily.  11.Aspirin 325 mg daily.   CONDITION ON DISCHARGE:  The patient is discharged in good condition  without any signs of distress or febrile.  She is instructed to follow  up with [facility] for a stress test, she will be given the date  and time of the appointment before leaving the building.   HOSPITAL COURSE:  [name] is a 70 year old woman without any risk  factors of coronary artery disease was admitted with typical angina.  Her EKG did not have any changes and 3 sets of cardiac enzymes did not  indicate any  elevated troponins.  The patient did not have any recurrence of her  angina, and because of her overall low risk for CAD she was referred for  outpatient stress testing.  The patient was instructed to continue all  her home medications including aspirin and to report back to the  emergency room in case she has recurrent angina.      Lonia Blood, M.D.  Electronically  Signed     SL/MEDQ  D:  12/05/2008  T:  12/05/2008  Job:  [id number]   cc:   Georgina Quint. Plotnikov, MD

## 2010-10-09 NOTE — Assessment & Plan Note (Signed)
[facility]                         GASTROENTEROLOGY OFFICE NOTE   NAME:[name], [name]                   MRN:          [id number]  DATE:06/30/2007                            DOB:          [birth year]    [name] is much better symptomatically, but continues to complain of gas,  bloating, and flatus.  Her workup was unremarkable except for slight  chronic increase in her amylase and lipase, which has been a rather  persistent abnormality because of her chronic pancreatitis.  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  She continues to complain  of gas and bloating despite taking her Viokase and Nexium.  I have gone  ahead and spoke to her about treatment for this syndrome, but she still  will not pay for Xifaxan.  I have therefore empirically placed her on  Cipro 500 mg twice a day for 10 days, along with Align probiotic  therapy.  I have sent her by the lab to check a B12 and anemia profile.  I have explained avoidance of nondigestible carbohydrates to her, such  as sorbitol and fructose, and she will try to follow these suggestions  additionally.  She is to continue all of her other medications listed  and reviewed in her chart and follow up as needed.     Vania Rea. Jarold Motto, MD, Caleen Essex, FAGA  Electronically Signed    DRP/MedQ  DD: 06/30/2007  DT: 07/01/2007  Job #: [id number]   cc:   Georgina Quint. Plotnikov, MD

## 2010-10-12 NOTE — Assessment & Plan Note (Signed)
[facility]                           GASTROENTEROLOGY OFFICE NOTE   NAME:[name], [name]                   MRN:          [id number]  DATE:12/26/2005                            DOB:          05-Jul-1940    [name] is having no problems with her chronic pancreatitis, taking her  pancreatic extracts in the form of Viokase 6 tablets three times a day after  meals.  [name] is on a variety of other medications, listed and reviewed in her  chart.  Recent liver function tests in Dr. Particia Jasper office were normal.   [name] has known chronic idiopathic pancreatitis with chronic pain syndrome,  managed by Viokase therapy.  [name] also takes daily Nexium for acid reflux.  [name] now complains of abdominal cramping, gas and bloating, and excessive  flatus.   Recent lab data in Dr. Particia Jasper office showed normal liver function  tests, [name] has had a negative hepatitis chronic liver disease workup, but  has not had liver biopsy.   [name] is awake and alert, in no acute distress.  Her weight is 126 pounds,  which is her normal weight, and blood pressure is 102/76.  Pulse was 76 and  regular.  I could not appreciate stigmata of chronic liver disease.  Her  chest was clear and her abdominal exam showed no organomegaly, masses or  significant tenderness or distention.   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  Her hypertransaminasemia seems to have resolved.   RECOMMENDATIONS:  1.  Continue pancreatic extracts, as mentioned above.  2.  Trial of Xifaxan 400 mg three times a day for 10 days, along with Align      probiotic therapy.  3.  Other medications as per primary care doctor, Dr. Rosalio Macadamia.  4.  [name] is to call us in two weeks' time for progress report.                                   Vania Rea. Jarold Motto, MD, [facility], [place]   DRP/MedQ  DD:  12/26/2005  DT:   12/26/2005  Job #:  [id number]

## 2010-10-12 NOTE — Discharge Summary (Signed)
NAMEDEZIRAY, NABI                           ACCOUNT NO.:  [id number]   MEDICAL RECORD NO.:  [id number]                   PATIENT TYPE:  INP   LOCATION:  2922                                 FACILITY:  [facility]   PHYSICIAN:  Lavella Hammock, P.A. [facility]            DATE OF BIRTH:  1940/08/26   DATE OF ADMISSION:  03/03/2002  DATE OF DISCHARGE:                           DISCHARGE SUMMARY - REFERRING   PROCEDURE:  1. 2-D echocardiogram.   HOSPITAL COURSE:  The patient is a 70 year old female with no known history  of coronary artery disease.  She is a patient of [facility] and is  followed by Dr. Shawnie Pons for paroxysmal supraventricular tachycardia  which has been well controlled on calcium channel blockers.  She had taken  beta blockers in the past for this, but did not tolerate Lopressor well  secondary to extreme fatigue, as stated she was too tired to walk across a  room.  The dose of Lopressor is unknown.   On the weekend before admission, she had been at an apple festival at which  she had been walking around for several hours.  She stated she did not feel  that she was dehydrated and was not overtly sweating, but she had not had  anything to drink.  She had a hot feeling and a flushed feeling that was  associated with weakness and dizziness.  She had no palpitations.  No chest  pain.  She felt that she needed to sit down and did so, but had a syncopal  episode.  This resolved spontaneously and was brief in duration.  She had  another syncopal episode that day.  She was evaluated in the office on the  day of admission and during an orthostatic blood pressure  check had  hypotension with blood pressure  that dropped from 107 to 68.  She was  momentarily unconscious, but it resolved spontaneously in seconds.  During  any of the syncopal episodes, she did not incur any injuries.  She was  admitted to the hospital for further evaluation and treatment.   Her BUN and  creatinine were within normal limits and her sodium was 140, but  there was concern that she had become slightly dehydrated and IV fluids were  started.  There was also concern that this was neurocardiogenic syncope as  well.  She was hydrated with normal saline and her orthostatic improved.  Additionally, she had been on Cardizem prior to admission and this was held.  She had been on Effexor prior to admission and this was the only new  medication, but she had only had one or two doses of this and she did not  like the side effects so she discontinued it.  Pharmacy was contacted, but  felt that this medication should not have caused any symptoms of the kind  she experienced.  She also had a 2-D echocardiogram which was  completely  normal with a normal EF.  No significant valvular abnormality and an RV that  was functioning well as well.   By 03/05/02, her orthostatics were negative even after she had been standing  for four minutes.  The Cardizem was held and she had no episodes of  bradycardia.  Additionally, even off the Cardizem she had no further  episodes of PSVT.  She was ambulating without difficulty and considered  stable for discharge on 03/05/02.   She is to follow up in the office with a gated exercise treadmill test to  see if she has chronic trophic incompetence off the Cardizem.  She was  advised that there was no need to change her salt and fluid intake at this  time.  If she continues to have symptoms, then consideration can be given to  starting either ProAmatine or Florinef as well as changes in her dietary  fluid and salt intake.   LABORATORY VALUES:  Hemoglobin 11.8, hematocrit 35.8, WBCs 8.7, platelets  270.  Sodium 140, potassium 4.1, chloride 108, CO2 27, BUN 12, creatinine  0.8, glucose 109.  Other CMET values within normal limits.  Cortisol level  random 8.5.  D-dimer negative at 0.34.  TSH 2.510.   CHEST X-RAY:  Lungs are clear.  There is no acute cardiac or  pulmonary  process noted.   DISCHARGE CONDITION:  Improved.   DISCHARGE DIAGNOSES:  1. Syncope, possibly neurocardiogenic, Cardizem held for now and the patient     hydrated with normal saline.  She is to follow up in the office.  2. Paroxysmal supraventricular tachycardia, short lived with normal  QTc.  3. Past history of pancreatitis.  4. Preserved left ventricular function with no significant abnormality by     echocardiogram this admission.  5. History of reflux.  6. History of constipation.  7. History of hemorrhoids.  8. History of osteoarthritis.  9. History of insomnia and hot flashes secondary to menopause.  10.      HISTORY OF ALLERGIES TO PENICILLIN, EPINEPHRINE AND CODEINE.   DISCHARGE INSTRUCTIONS:  Her activity level is to be as tolerated , but she  is not to drive.  She is to stick to a low fat diet with no salt  restrictions.  She is to get a treadmill done by the PA for Dr. Riley Kill as  well as an office visit on Wednesday, 10/15 at 11 a.m.  She is not to eat or  drink anything after 8 a.m., but she can have clear liquids before then and  take her medications.  She is to follow up with Dr. Posey Rea as scheduled.   DISCHARGE MEDICATIONS:  1. Nexium 40 mg q.d.  2. Aspirin 325 mg q.d.  3. Viokase three times a day.  4. Vivelle-Dot every week.  5. Stool softener p.r.n.  6. Vitamin E 800 units q.d.  7. Folic acid 400 mcg q.d.  8. Viactiv 1500 mg q.d.   She is not to take Effexor or Cardizem for now.                                                Lavella Hammock, P.A. [facility]    RG/MEDQ  D:  03/05/2002  T:  03/06/2002  Job:  [id number]   cc:   Georgina Quint. Plotnikov, M.D. [facility]

## 2010-10-12 NOTE — Letter (Signed)
June 18, 2006    W. Delia Chimes, M.D.  [address] [address]  [place], [place] [address]   RE:  [name], [name]  MRN:  [id number]  /  DOB:  April 02, 1941   Dear Dr. Benna Dunks:   I hope this letter finds you well.  It has been some years since we have  spoken.   [name] came in today in anticipation of the surgery that [name] has  scheduled with you on July 14, 2006.  The bottom line is I think  that [name] should be at acceptable risk to have this done.  I do applaud  your thoughtfulness about doing it over at [facility], though.   [name] cardiovascular history is notable for palpitations which have been  demonstrated to be nonsustained atrial tachycardia, as well as having  problems a number of years ago with profound orthostatic hypotension in  the setting of a transient dysautonomia.  For right now, [name] is doing  quite well.  [name] is active, exercising.   [name] medications, you know, include Nexium 40, Lexapro 5, estrogen patch,  aspirin 325.  [name] is allergic to PENICILLIN and EPINEPHRINE.  [name] was  intolerant for longterm TOPROL-XL because of fatigue.   On examination today, [name] blood pressure is 122/76, [name] pulse was 75.  Lungs were clear, heart sounds were regular, extremities were without  edema.   Electrocardiogram was normal and I have given a copy to the patient for  you for your records.   I will plan to put [name] on a low-dose beta blocker in anticipation of the  surgery, specifically Lopressor 25 twice daily.  [name] will being this on  Saturday, the 16th.  I would suggest that [name] continue it for about 10  days after the surgery to try to minimize the adrenergic stimulation  that may aggravate [name] palpitations.   I will be out of town on February 18.  My partner, Dr. Lewayne Bunting, is  around and he will be available through our office at [phone] if you  have any concerns at that time.   If there is anything else I can do in the interim, please do not  hesitate to contact me.    Sincerely,      Duke Salvia, MD, [facility]  Electronically Signed    SCK/MedQ  DD: 06/18/2006  DT: 06/18/2006  Job #: [id number]

## 2010-10-12 NOTE — Assessment & Plan Note (Signed)
[facility]                           ELECTROPHYSIOLOGY OFFICE NOTE   NAME:[name], [name]                   MRN:          [id number]  DATE:02/10/2006                            DOB:          1941-01-20    [name] continues to do pretty well.  She continues to have some problems  with orthostasis if she stands too long with associated tachy palpitations.  Overall, however, she is doing quite well.   She is having problems with allergies for which she takes Zyrtec, but this  is not working.  She went to [place] and her allergies got better and came  home.  On history, she has a very old bed and old pillows.  I wonder whether  she does not have dust mites.   PHYSICAL EXAMINATION:  VITAL SIGNS:  Blood pressure 134/85, pulse 67.  LUNGS:  Clear.  HEART:  Heart sounds were regular.  EXTREMITIES:  Without edema.   Electrocardiogram dated today demonstrated sinus rhythm at 60 with intervals  of 0.13/0.09/0.41.  The axis was 90 degrees.   IMPRESSION:  1. Orthostatic intolerance related probably to dysautonomia, possibly post-      viral.  2. Remote history of supraventricular tachycardia.   Ms. [name] is doing pretty well at this point.  We will plan to see her again  in a year.  She asked the question about whether she might have some  elective surgery.  I thought that this would not be inappropriate as long as  the anesthesia team was aware of her dysautonomia.                                   Duke Salvia, MD, [facility]   SCK/MedQ  DD:  02/10/2006  DT:  02/11/2006  Job #:  [id number]

## 2010-10-18 ENCOUNTER — Encounter (INDEPENDENT_AMBULATORY_CARE_PROVIDER_SITE_OTHER): Payer: Self-pay | Admitting: Surgery

## 2010-10-29 ENCOUNTER — Telehealth: Payer: Self-pay | Admitting: Gastroenterology

## 2010-10-29 NOTE — Telephone Encounter (Signed)
Patient is requesting a letter for jury duty.  She says the letter needs to be specific and state the prednisone she is taking is effecting her sleep, memory and concentration. medicine effects her  Dr Jarold Motto please advise if ok to send letter.

## 2010-10-29 NOTE — Telephone Encounter (Signed)
Letter written and put out front for her to pick up

## 2010-10-29 NOTE — Telephone Encounter (Signed)
OK 

## 2010-11-08 ENCOUNTER — Encounter: Payer: Self-pay | Admitting: Internal Medicine

## 2010-11-09 ENCOUNTER — Encounter: Payer: Self-pay | Admitting: Internal Medicine

## 2010-11-09 ENCOUNTER — Ambulatory Visit (INDEPENDENT_AMBULATORY_CARE_PROVIDER_SITE_OTHER): Payer: Medicare Other | Admitting: Internal Medicine

## 2010-11-09 VITALS — BP 108/64 | HR 85 | Temp 98.5°F | Ht 65.0 in | Wt 123.5 lb

## 2010-11-09 DIAGNOSIS — H6980 Other specified disorders of Eustachian tube, unspecified ear: Secondary | ICD-10-CM

## 2010-11-09 DIAGNOSIS — H9209 Otalgia, unspecified ear: Secondary | ICD-10-CM | POA: Insufficient documentation

## 2010-11-09 DIAGNOSIS — H6990 Unspecified Eustachian tube disorder, unspecified ear: Secondary | ICD-10-CM | POA: Insufficient documentation

## 2010-11-09 DIAGNOSIS — H6691 Otitis media, unspecified, right ear: Secondary | ICD-10-CM

## 2010-11-09 DIAGNOSIS — H6690 Otitis media, unspecified, unspecified ear: Secondary | ICD-10-CM

## 2010-11-09 DIAGNOSIS — F411 Generalized anxiety disorder: Secondary | ICD-10-CM

## 2010-11-09 MED ORDER — DOXYCYCLINE HYCLATE 100 MG PO TABS: 100.0000 mg | ORAL_TABLET | Freq: Two times a day (BID) | ORAL | Status: AC

## 2010-11-09 NOTE — Patient Instructions (Signed)
Take all new medications as prescribed Continue all other medications as before, including re-starting the nasonex as well Please keep your appointments with your specialists as you have planned - Dr Jarold Motto

## 2010-11-09 NOTE — Assessment & Plan Note (Signed)
As above, for mucinex otc prn

## 2010-11-09 NOTE — Assessment & Plan Note (Signed)
With mult antibx intol/allergy - for doxy course, mucines, ok to start the nasonex she has, nettie pott tx and valsalva maneuver bid prn

## 2010-11-16 ENCOUNTER — Encounter: Payer: Self-pay | Admitting: Gastroenterology

## 2010-11-16 ENCOUNTER — Ambulatory Visit (INDEPENDENT_AMBULATORY_CARE_PROVIDER_SITE_OTHER): Payer: Medicare Other | Admitting: Gastroenterology

## 2010-11-16 VITALS — BP 128/74 | HR 80 | Ht 65.0 in | Wt 124.0 lb

## 2010-11-16 DIAGNOSIS — K861 Other chronic pancreatitis: Secondary | ICD-10-CM

## 2010-11-16 DIAGNOSIS — H8390 Unspecified disease of inner ear, unspecified ear: Secondary | ICD-10-CM

## 2010-11-16 DIAGNOSIS — H9390 Unspecified disorder of ear, unspecified ear: Secondary | ICD-10-CM

## 2010-11-16 DIAGNOSIS — K8689 Other specified diseases of pancreas: Secondary | ICD-10-CM

## 2010-11-16 DIAGNOSIS — K219 Gastro-esophageal reflux disease without esophagitis: Secondary | ICD-10-CM

## 2010-11-16 DIAGNOSIS — Z8719 Personal history of other diseases of the digestive system: Secondary | ICD-10-CM

## 2010-11-16 DIAGNOSIS — T380X5A Adverse effect of glucocorticoids and synthetic analogues, initial encounter: Secondary | ICD-10-CM

## 2010-11-16 MED ORDER — ESCITALOPRAM OXALATE 10 MG PO TABS: 10.0000 mg | ORAL_TABLET | Freq: Every day | ORAL | Status: DC

## 2010-11-16 NOTE — Patient Instructions (Signed)
Dr Jarold Motto has increased your Lexapro to 10 mg once a day. We have sent in a 90 day supply to [facility] per your request. If you need refills on any of your medications contact  Your pharmacy and they will send our office a request.

## 2010-11-16 NOTE — Progress Notes (Signed)
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 She currently is asymptomatic on tapering doses of prednisone/ her current level of 10 mg a day. Side effects include agitation and insomnia. She denies abdominal pain, nausea vomiting, or systemic complaints. She has had multiple problems with inner ear infections and currently is completing a ten-day course of doxycycline. She is very concerned about upcoming trip to [place] with prolonged airline travel. She has called to Dr. Suzanna Obey per ENT followup. Because of her insomnia, she is taking Lexapro 5-10 mg at night. She chronically is on Protonix,  calcium and vitamin D replacement, and necessary sublingual Levsin.  Current Medications, Allergies, Past Medical History, Past Surgical History, Family History and Social History were reviewed in [employer] record.   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 If she has relapse we'll consider low-dose immunosuppressive therapy with Imuran or 6-MP. I have virtually complete her antibiotic course of doxycycline as per Dr. Jonny Ruiz, and to hopefully have ENT evaluation by Dr. Jearld Fenton before her trip in one week. No diagnosis found.

## 2010-11-18 ENCOUNTER — Encounter: Payer: Self-pay | Admitting: Internal Medicine

## 2010-11-18 NOTE — Progress Notes (Signed)
Subjective:    Patient ID: [name], female    DOB: 09-04-1940, 70 y.o.   MRN: [id number]  HPI   Here with 3 days acute onset fever, mild to mod right ear pain, pressure, general weakness and malaise, with slight ST, but little to no cough and Pt denies chest pain, increased sob or doe, wheezing, orthopnea, PND, increased LE swelling, palpitations, dizziness or syncope. Pt denies new neurological symptoms such as new headache, or facial or extremity weakness or numbness but has had some popping and crackling to the ear as well.   Pt denies polydipsia, polyuria.  Denies worsening depressive symptoms, suicidal ideation, or panic, though has ongoing anxiety, not increased recently.  Past Medical History  Diagnosis Date  . Pancreatitis chronic   . Esophageal reflux   . Internal hemorrhoids without mention of complication   . Osteoarthritis   . ANXIETY, CHRONIC 08/29/2007  . BRONCHITIS, ACUTE 08/15/2008  . CHEST PAIN 12/13/2008  . Chronic pancreatitis 01/10/2007  . CONSTIPATION 01/12/2008  . CYSTITIS 05/01/2007  . GERD (gastroesophageal reflux disease) 09/27/2010  . HEMORRHOIDS, NOS 05/01/2007  . HYPOTENSION, ORTHOSTATIC 02/26/2010  . OSTEOARTHRITIS 01/10/2007  . IBS (irritable bowel syndrome) 09/27/2010  . PALPITATIONS, RECURRENT 02/26/2010  . 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  . SHOULDER PAIN 12/29/2007  . VITAMIN D DEFICIENCY 05/01/2007  . TOBACCO USE, QUIT 03/29/2009   Past Surgical History  Procedure Date  . Abdominal hysterectomy   . Cholecystectomy 1999  . Knee surgery 1958    reports that she quit smoking about 37 years ago. Her smoking use included Cigarettes. She has never used smokeless tobacco. She reports that she does not drink alcohol or use illicit drugs. family history includes Breast cancer in her cousin; Cancer in her father; Heart disease in her father and sister; Hypertension in her mother; Ovarian cancer in her paternal aunt; Pancreatic cancer in her paternal aunt; Stomach cancer in her  paternal uncle; Stroke in her mother; and Uterine cancer in her paternal grandmother. Allergies  Allergen Reactions  . Amoxicillin     REACTION: unspecified  . Azithromycin     REACTION: Rash  . Ciprofloxacin     REACTION: GI upset  . Epinephrine     REACTION: unspecified  . Penicillins     REACTION: rash  . Sulfonamide Derivatives     REACTION: sick to stomach   Current Outpatient Prescriptions on File Prior to Visit  Medication Sig Dispense Refill  . Ascorbic Acid (VITAMIN C) 500 MG tablet Take 500 mg by mouth daily.        Marland Kitchen aspirin 325 MG tablet Take 325 mg by mouth daily.        . cetirizine (ZYRTEC) 10 MG tablet Take 10 mg by mouth daily.        . Cholecalciferol (VITAMIN D3) 1000 UNITS CAPS Take 1 tablet by mouth daily.        Marland Kitchen estradiol (VIVELLE-DOT) 0.025 MG/24HR Place 1 patch onto the skin once a week.        . fluticasone (FLONASE) 50 MCG/ACT nasal spray 2 sprays by Nasal route as needed.        . hydrocortisone (ANUSOL-HC) 25 MG suppository Place 25 mg rectally daily.        . hyoscyamine (LEVSIN/SL) 0.125 MG SL tablet Place 0.125 mg under the tongue every 4 (four) hours as needed.        . Multiple Vitamin (MULTIVITAMIN) capsule Take 1 capsule by mouth daily.        Marland Kitchen  Omega-3 Fatty Acids (FISH OIL) 1000 MG CAPS Take 1 capsule by mouth daily.        . Pancrelipase, Lip-Prot-Amyl, 24000 UNITS CPEP Take 6 capsules by mouth 3 (three) times daily.        . pantoprazole (PROTONIX) 40 MG tablet Take 40 mg by mouth 2 (two) times daily.        . predniSONE (DELTASONE) 10 MG tablet Use 10mg  tabs with 20mg  prednisone tabs for tapering for AIP  100 tablet  0  . Probiotic Product (PROBIOTIC PO) Take by mouth. Takes Probaclac probiotic once daily       . scopolamine (TRANSDERM-SCOP) 1.5 MG Place 1 patch onto the skin every 3 (three) days.        . Cranberry 125 MG TABS Take 1 tablet by mouth daily.         Review of Systems Review of Systems  Constitutional: Negative for  diaphoresis and unexpected weight change.  HENT: Negative for drooling and tinnitus.   Eyes: Negative for photophobia and visual disturbance.  Respiratory: Negative for choking and stridor.   Gastrointestinal: Negative for vomiting and blood in stool.  Genitourinary: Negative for hematuria and decreased urine volume.     Objective:   Physical Exam BP 108/64  Pulse 85  Temp(Src) 98.5 F (36.9 C) (Oral)  Ht 5\' 5"  (1.651 m)  Wt 123 lb 8 oz (56.019 kg)  BMI 20.55 kg/m2  SpO2 98% Physical Exam  VS noted Constitutional: Pt appears well-developed and well-nourished.  HENT: Head: Normocephalic.  Right Ear: External ear normal.  Right TM with mod to severe erythema Left Ear: External ear normal. left TM clear- normal appearance   Sinus nontender.  Pharynx mild erythema Eyes: Conjunctivae and EOM are normal. Pupils are equal, round, and reactive to light.  Neck: Normal range of motion. Neck supple.  Cardiovascular: Normal rate and regular rhythm.   Pulmonary/Chest: Effort normal and breath sounds normal.  Neurological: Pt is alert. No cranial nerve deficit.  Skin: Skin is warm. No erythema.  Psychiatric: Pt behavior is normal. Thought content normal. 1+ nervous        Assessment & Plan:

## 2010-11-18 NOTE — Assessment & Plan Note (Signed)
stable overall by hx and exam, most recent data reviewed with pt, and pt to continue medical treatment as before  Lab Results  Component Value Date   WBC 7.1 09/06/2010   HGB 13.8 09/06/2010   HCT 40.2 09/06/2010   PLT 228.0 09/06/2010   CHOL  Value: 129        ATP III CLASSIFICATION:  <200     mg/dL   Desirable  130-865  mg/dL   Borderline High  >=784    mg/dL   High        6/96/2952   TRIG 11 12/05/2008   HDL 84 12/05/2008   ALT 46* 09/06/2010   AST 39* 09/06/2010   NA 139 09/06/2010   K 4.3 09/06/2010   CL 101 09/06/2010   CREATININE 1.0 09/06/2010   BUN 16 09/06/2010   CO2 31 09/06/2010   TSH 1.98 09/06/2010   INR 1.0 12/04/2008

## 2010-11-23 ENCOUNTER — Telehealth: Payer: Self-pay | Admitting: *Deleted

## 2010-11-23 MED ORDER — FLUCONAZOLE 150 MG PO TABS: 150.0000 mg | ORAL_TABLET | Freq: Once | ORAL | Status: AC

## 2010-11-23 NOTE — Telephone Encounter (Signed)
Ok Diflucan 

## 2010-11-23 NOTE — Telephone Encounter (Signed)
Pt left msg on vm stating she saw Dr. Jonny Ruiz last Friday was given antibiotic for ear infection. Pt states she has completed med, but she has develop a yeast infection from antibiotic. Leaving going out of town on tomorrow requesting md to rx something for yeast infection.

## 2010-11-24 NOTE — Telephone Encounter (Signed)
Called pt to let her know md sent in diflucan.Marland KitchenMarland Kitchen6/30/12@9 :22am/LMB

## 2010-12-25 ENCOUNTER — Other Ambulatory Visit: Payer: Self-pay | Admitting: Gastroenterology

## 2010-12-25 MED ORDER — PANCRELIPASE (LIP-PROT-AMYL) 24000-76000 UNITS PO CPEP: 6.0000 | ORAL_CAPSULE | Freq: Three times a day (TID) | ORAL | Status: DC

## 2010-12-25 NOTE — Telephone Encounter (Signed)
Left message rx sent to [facility]

## 2011-01-01 ENCOUNTER — Telehealth: Payer: Self-pay | Admitting: Gastroenterology

## 2011-01-01 NOTE — Telephone Encounter (Signed)
Not unusal with prednisone withdrawal..[name]

## 2011-01-01 NOTE — Telephone Encounter (Signed)
lmom for pt to call back

## 2011-01-01 NOTE — Telephone Encounter (Signed)
Patient notified of Dr. Norval Gable answer. She understands to call us back for further problems, pain gets worse.

## 2011-01-01 NOTE — Telephone Encounter (Signed)
Patient is now on her 4th week of Prednisone 5 mg QOD. For the last 2 weekends, she has noticed increased pain in her knees, toes and ankles. The pain comes and goes and is not unbearable. Her knees are weak also. She was concerned that it is the starting up of pancreatitis or withdrawal from Prednisone. She will be going out of town on Thursday and wanted Dr. Jarold Motto to know about this before she goes out of town. States she usually takes Fish oil daily but she increased her dose yesterday to TID because she read on the intranet that this might help. She is sleeping better now. Has f/u OV with Dr. Jarold Motto on 01/08/11.

## 2011-01-08 ENCOUNTER — Encounter: Payer: Self-pay | Admitting: Gastroenterology

## 2011-01-08 ENCOUNTER — Other Ambulatory Visit (INDEPENDENT_AMBULATORY_CARE_PROVIDER_SITE_OTHER): Payer: Medicare Other

## 2011-01-08 ENCOUNTER — Ambulatory Visit (INDEPENDENT_AMBULATORY_CARE_PROVIDER_SITE_OTHER): Payer: Medicare Other | Admitting: Gastroenterology

## 2011-01-08 VITALS — BP 110/68 | HR 92 | Ht 65.0 in | Wt 125.6 lb

## 2011-01-08 DIAGNOSIS — K219 Gastro-esophageal reflux disease without esophagitis: Secondary | ICD-10-CM

## 2011-01-08 DIAGNOSIS — K861 Other chronic pancreatitis: Secondary | ICD-10-CM

## 2011-01-08 DIAGNOSIS — K589 Irritable bowel syndrome without diarrhea: Secondary | ICD-10-CM

## 2011-01-08 LAB — LIPID PANEL
LDL Cholesterol: 55 mg/dL (ref 0–99)
Total CHOL/HDL Ratio: 1

## 2011-01-08 LAB — SEDIMENTATION RATE: Sed Rate: 10 mm/hr (ref 0–22)

## 2011-01-08 LAB — LIPASE: Lipase: 54 U/L (ref 11.0–59.0)

## 2011-01-08 NOTE — Progress Notes (Signed)
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 She currently is doing well except for occasional gas and bloating. On multiple medications listed and reviewed in her chart.  Current Medications, Allergies, Past Medical History, Past Surgical History, Family History and Social History were reviewed in [employer] record.  Pertinent Review of Systems Negative   Physical Exam: Awake and alert no acute distress appearing her stated age. Chest and cardiac exam are unremarkable. Her abdomen shows no organomegaly, masses, and tenderness, or distention. Bowel sounds are normal. Mental status is normal.    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 We have had a long discussion concerning her illness and will repeat her labs including IgG levels, 6 MP-Imuran metabolic genetic studies, amylase, lipase, and lipid profile. She will come back in 3 weeks' time and we will decide on initiating low dose immunosuppressive therapy to avoid relapses and steroid dependency. Otherwise she is to continue all medications as listed and reviewed in her chart. Encounter Diagnosis  Name Primary?  . Chronic pancreatitis Yes

## 2011-01-08 NOTE — Patient Instructions (Signed)
Please make an follow up office visit with Dr Jarold Motto in 3 months.  Please go to the basement today for your labs.

## 2011-01-09 ENCOUNTER — Telehealth: Payer: Self-pay | Admitting: *Deleted

## 2011-01-09 NOTE — Telephone Encounter (Signed)
Message copied by Leonette Monarch on Wed Jan 09, 2011  8:39 AM ------      Message from: PATTERSON, DAVID R      Created: Tue Jan 08, 2011  5:09 PM       Amylase, lipase, and sedimentation rate normal. Her lipid profile looks excellent with good HDL. Please call the patient with these results.

## 2011-01-09 NOTE — Telephone Encounter (Signed)
Left message labs normal

## 2011-01-14 ENCOUNTER — Telehealth: Payer: Self-pay | Admitting: *Deleted

## 2011-01-14 NOTE — Telephone Encounter (Signed)
Advised pt that Viokace can be ordered, we will order it at old dose which is Viokace 8 30-8 6 tabs tid with meals, she will wait until she has taken almost all of her creon.

## 2011-01-22 ENCOUNTER — Encounter: Payer: Self-pay | Admitting: Gastroenterology

## 2011-01-25 ENCOUNTER — Telehealth: Payer: Self-pay | Admitting: Gastroenterology

## 2011-01-30 NOTE — Telephone Encounter (Signed)
Pt would like to know if her labs for IGG1,2,3,4 are better I have placed the paper copy of new labs in your yellow folder, they never came over into [facility].

## 2011-01-31 NOTE — Telephone Encounter (Signed)
Pt advised IGG levels back to normal per Dr Jarold Motto hand written labs are back down to normal.

## 2011-03-11 ENCOUNTER — Encounter: Payer: Self-pay | Admitting: *Deleted

## 2011-03-12 ENCOUNTER — Encounter: Payer: Self-pay | Admitting: Gastroenterology

## 2011-03-12 ENCOUNTER — Encounter: Payer: Self-pay | Admitting: Internal Medicine

## 2011-03-12 ENCOUNTER — Ambulatory Visit (INDEPENDENT_AMBULATORY_CARE_PROVIDER_SITE_OTHER): Payer: Medicare Other | Admitting: Internal Medicine

## 2011-03-12 ENCOUNTER — Encounter: Payer: Self-pay | Admitting: *Deleted

## 2011-03-12 ENCOUNTER — Ambulatory Visit (INDEPENDENT_AMBULATORY_CARE_PROVIDER_SITE_OTHER)
Admission: RE | Admit: 2011-03-12 | Discharge: 2011-03-12 | Disposition: A | Discharge status: Home / Self Care | Payer: Medicare Other | Source: Ambulatory Visit | Attending: Internal Medicine | Admitting: Internal Medicine

## 2011-03-12 ENCOUNTER — Ambulatory Visit (INDEPENDENT_AMBULATORY_CARE_PROVIDER_SITE_OTHER): Payer: Medicare Other | Admitting: Gastroenterology

## 2011-03-12 VITALS — BP 140/82 | HR 72 | Ht 65.25 in | Wt 124.0 lb

## 2011-03-12 DIAGNOSIS — I471 Supraventricular tachycardia, unspecified: Secondary | ICD-10-CM

## 2011-03-12 DIAGNOSIS — R079 Chest pain, unspecified: Secondary | ICD-10-CM

## 2011-03-12 DIAGNOSIS — K861 Other chronic pancreatitis: Secondary | ICD-10-CM

## 2011-03-12 DIAGNOSIS — I951 Orthostatic hypotension: Secondary | ICD-10-CM

## 2011-03-12 DIAGNOSIS — R072 Precordial pain: Secondary | ICD-10-CM

## 2011-03-12 DIAGNOSIS — R0789 Other chest pain: Secondary | ICD-10-CM

## 2011-03-12 DIAGNOSIS — K8689 Other specified diseases of pancreas: Secondary | ICD-10-CM

## 2011-03-12 MED ORDER — AMBULATORY NON FORMULARY MEDICATION: Status: DC

## 2011-03-12 NOTE — Patient Instructions (Signed)

## 2011-03-12 NOTE — Progress Notes (Signed)
This is a very pleasant 70 year old Caucasian female with relapsing autoimmune pancreatitis. She recently has tapered off a course of prednisone therapy for acute pancreatitis, and currently denies any GI complaints. She's been on Protonix 40 mg a day for several years for nonspecific dyspepsia. She recently has had 2 episodes of severe smothering mid substernal chest pain radiating into her back and into her jaws. These pains lasted  approximately an hour in duration without real precipitating or alleviating events. She tried multiple anticholinergics without improvement. She has a history of SVT and is followed by Dr. Hurman Horn and Dr. Shawnie Pons. With these episodes she has had marked shortness of breath and apprehension. She has no known history of panic attacks ,but is on chronic Lexapro 10 mg a day and also aspirin 325 mg a day. The patient is status post cholecystectomy. Family history is remarkable for heart disease her father, and she had a sister that died at age 41 from rheumatic fever. Her mother apparently had a CVA.  Aseel denies reflux symptoms or dysphagia, or any specific hepatobiliary or lower gastrointestinal problems. She follows a regular diet and does not abuse alcohol, cigarettes, or NSAIDs otherwise. His had no anorexia, weight loss, fever, chills, or other systemic complaints.  Current Medications, Allergies, Past Medical History, Past Surgical History, Family History and Social History were reviewed in [employer] record.  Pertinent Review of Systems Negative... patient exercises daily, and apparently has orthostatic hypotension problems. He has not smoked in many years. He is on estrogen patch replacement therapy. She denies cough, sputum production, hemoptysis, or other pulmonary problems   Physical Exam: Alert and awake in no acute distress. I cannot appreciate stigmata of chronic liver disease. Her chest is clear and she appears to be in a regular  rhythm without murmurs gallops or rubs. Abdominal exam shows no organomegaly, masses or tenderness. Bowel sounds are normal. Peripheral extremities are unremarkable and mental status is normal.    Assessment and Plan: Atypical chest pain in a 70 year old Caucasian female a history of supraventricular tachycardia. Her pain sounds cardiac in nature, perhaps related to arrhythmias or coronary artery disease. As mentioned above, she is on daily PPI therapy, and has no current gastrointestinal issues. I have talked with Dr. Graciela Husbands who will see her this afternoon for further cardiac evaluation and consideration of cardiac catheterization. I have prescribed some GI cocktail to keep on hand for when necessary use if her workup cardiac wise is negative. Otherwise, I have advised to continue all medications as listed and reviewed. No diagnosis found.

## 2011-03-12 NOTE — Assessment & Plan Note (Signed)
The patient has chest pain which is likely not cardiac. The symptoms however are very disconcerting the patient and concerning to me. Her negative Myoview 2 years ago mitigates this considerably however. Her anxiety, still, it is very high regarding this. We discussed strategies including CTA versus catheterization. We discussed the benefits of the former being noninvasive and a limits being the presence of a calcium versus invasive nature the catheterization and the increased sensitivity of that test. She would prefer the latter. We discussed potential benefits and risks including but not limited to stroke heart attack and vascular injury. This will be done hopefully for the wrist, minimizing some of those risks.

## 2011-03-12 NOTE — Assessment & Plan Note (Signed)
She another episode of tachycardia palpitations which she was able to abort with Valsalva. This is the first in many months

## 2011-03-12 NOTE — Progress Notes (Signed)
HPI  [name] is a 70 y.o. female Seen today at the request of Dr. Jarold Motto because of recurrent episodes of chest pain.  These were similar to episodes that occurred 2 years ago that prompted hospitalization. She ruled out for myocardial infarction at that time. She underwent Myoview scanning that was normal.  She is quite fit exercising vigorously 4 days a week. Over the last 2 weeks she's had 2 severe episodes of discomfort accompanied by diaphoresis shortness of breath with radiation to the neck arm and jaw. These lasted about an hour. The last episode was 10 days ago.  Cardiac risk factors are broadly negative.  Past Medical History  Diagnosis Date  . Pancreatitis chronic   . Esophageal reflux   . Internal hemorrhoids without mention of complication   . Osteoarthritis   . ANXIETY, CHRONIC 08/29/2007  . BRONCHITIS, ACUTE 08/15/2008  . CHEST PAIN 12/13/2008  . Chronic pancreatitis 01/10/2007  . CONSTIPATION 01/12/2008  . CYSTITIS 05/01/2007  . GERD (gastroesophageal reflux disease) 09/27/2010  . HEMORRHOIDS, NOS 05/01/2007  . HYPOTENSION, ORTHOSTATIC 02/26/2010  . OSTEOARTHRITIS 01/10/2007  . IBS (irritable bowel syndrome) 09/27/2010  . PALPITATIONS, RECURRENT 02/26/2010  . 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  . SHOULDER PAIN 12/29/2007  . VITAMIN D DEFICIENCY 05/01/2007  . TOBACCO USE, QUIT 03/29/2009    Past Surgical History  Procedure Date  . Abdominal hysterectomy   . Cholecystectomy 1999  . Knee surgery 1958    Current Outpatient Prescriptions  Medication Sig Dispense Refill  . Ascorbic Acid (VITAMIN C) 500 MG tablet Take 500 mg by mouth daily.        Marland Kitchen aspirin 81 MG tablet Take 81 mg by mouth 2 (two) times daily.        . cetirizine (ZYRTEC) 10 MG tablet Take 10 mg by mouth daily.        . Cholecalciferol (VITAMIN D3) 1000 UNITS CAPS Take 1 tablet by mouth daily.        . Cranberry 125 MG TABS Take 1 tablet by mouth daily.        Marland Kitchen escitalopram (LEXAPRO) 10 MG tablet Take 5 mg by mouth  daily.        Marland Kitchen estradiol (VIVELLE-DOT) 0.025 MG/24HR Place 1 patch onto the skin once a week.        . fluticasone (FLONASE) 50 MCG/ACT nasal spray 2 sprays by Nasal route as needed.        . hydrocortisone (ANUSOL-HC) 25 MG suppository Place 25 mg rectally daily.        . hyoscyamine (LEVSIN/SL) 0.125 MG SL tablet Place 0.125 mg under the tongue every 4 (four) hours as needed.        . Multiple Vitamin (MULTIVITAMIN) capsule Take 1 capsule by mouth daily.        . Omega-3 Fatty Acids (FISH OIL) 1000 MG CAPS Take 1 capsule by mouth daily.        . pantoprazole (PROTONIX) 40 MG tablet Take 40 mg by mouth 2 (two) times daily.        . Probiotic Product (PROBIOTIC PO) Take by mouth. Takes Probaclac probiotic once daily       . scopolamine (TRANSDERM-SCOP) 1.5 MG Place 1 patch onto the skin every 3 (three) days. Only used when traveling      . AMBULATORY NON FORMULARY MEDICATION Medication Name: GI Cocktail (20 cc maalox, 10 cc donatal, 20 cc xylocaine) Take 1 tablespoon by mouth every 2 hours as needed.  400 mL  0    Allergies  Allergen Reactions  . Amoxicillin     REACTION: unspecified  . Azithromycin     REACTION: Rash  . Ciprofloxacin     REACTION: GI upset  . Epinephrine     REACTION: unspecified  . Penicillins     REACTION: rash  . Sulfonamide Derivatives     REACTION: sick to stomach    Review of Systems negative except from HPI and PMH  Physical Exam Well developed and well nourished in no acute distress HENT normal E scleral and icterus clear Neck Supple JVP flat; carotids brisk and full Clear to ausculation Regular rate and rhythm, no murmurs gallops or rub Soft with active bowel sounds No clubbing cyanosis and edema Alert and oriented, grossly normal motor and sensory function Skin Warm and Dry  ECGsinus rhythm at 82 Intervals 0.13 5.09/0.38 Axis is 80 ST Segment flattening in V5 and V6 which is new as well as minor depression in V. Leads 3 and aVF which are  new  Assessment and  Plan

## 2011-03-12 NOTE — Assessment & Plan Note (Signed)
Relatively stable at this time 

## 2011-03-12 NOTE — Patient Instructions (Addendum)
We have sent a prescription for GI cocktail to your pharmacy. You are scheduled to see Dr Sherryl Manges at [facility] @ 3 pm today. CC: Dr Sherryl Manges

## 2011-03-13 ENCOUNTER — Telehealth: Payer: Self-pay | Admitting: Internal Medicine

## 2011-03-13 ENCOUNTER — Ambulatory Visit (INDEPENDENT_AMBULATORY_CARE_PROVIDER_SITE_OTHER): Payer: Medicare Other | Admitting: Family Medicine

## 2011-03-13 ENCOUNTER — Encounter: Payer: Self-pay | Admitting: Family Medicine

## 2011-03-13 VITALS — BP 144/88 | HR 77 | Temp 97.5°F | Ht 65.0 in | Wt 123.0 lb

## 2011-03-13 DIAGNOSIS — R079 Chest pain, unspecified: Secondary | ICD-10-CM

## 2011-03-13 DIAGNOSIS — J189 Pneumonia, unspecified organism: Secondary | ICD-10-CM

## 2011-03-13 DIAGNOSIS — N390 Urinary tract infection, site not specified: Secondary | ICD-10-CM | POA: Insufficient documentation

## 2011-03-13 LAB — BASIC METABOLIC PANEL
BUN: 16 mg/dL (ref 6–23)
Chloride: 104 mEq/L (ref 96–112)
Potassium: 4 mEq/L (ref 3.5–5.1)

## 2011-03-13 LAB — CBC WITH DIFFERENTIAL/PLATELET
Basophils Absolute: 0 10*3/uL (ref 0.0–0.1)
Eosinophils Absolute: 0.3 10*3/uL (ref 0.0–0.7)
HCT: 40 % (ref 36.0–46.0)
Lymphocytes Relative: 29.6 % (ref 12.0–46.0)
MCV: 94.3 fl (ref 78.0–100.0)
Monocytes Relative: 9.1 % (ref 3.0–12.0)
Neutro Abs: 3.9 10*3/uL (ref 1.4–7.7)
Neutrophils Relative %: 56.7 % (ref 43.0–77.0)
WBC: 6.9 10*3/uL (ref 4.5–10.5)

## 2011-03-13 LAB — POCT URINALYSIS DIPSTICK
Glucose, UA: 100
Nitrite, UA: NEGATIVE
Spec Grav, UA: 1.02
Urobilinogen, UA: 2

## 2011-03-13 LAB — PROTIME-INR: INR: 1 ratio (ref 0.8–1.0)

## 2011-03-13 MED ORDER — CEFDINIR 300 MG PO CAPS: 300.0000 mg | ORAL_CAPSULE | Freq: Two times a day (BID) | ORAL | Status: AC

## 2011-03-13 MED ORDER — GUAIFENESIN ER 600 MG PO TB12: 600.0000 mg | ORAL_TABLET | Freq: Two times a day (BID) | ORAL | Status: DC

## 2011-03-13 NOTE — Telephone Encounter (Signed)
I spoke with the patient today. She states that she woke up with blood in her urine today and went to see Dr. Rogelia Rohrer. She told Dr. Rogelia Rohrer she has had an occasional cough and that she had a chest x-ray done yesterday (pre-cath). Dr. Rogelia Rohrer told the patient she has pneumonia and she is being treated with Cesdinir BID x 10 days. She has been afebrile. I explained to the patient that I would make Dr. Graciela Husbands aware and call her back today. She is scheduled for a cath on 10/19.

## 2011-03-13 NOTE — Telephone Encounter (Signed)
Pt saw dr blythe and pt has UTI and Pneumonia, Cesdinir was prescribed, pt has heart cath 10-19 will this interfere?

## 2011-03-13 NOTE — Telephone Encounter (Signed)
OK with me if OK with previous PCP

## 2011-03-13 NOTE — Assessment & Plan Note (Signed)
Patient in today for urgent visit due to UTI. Urine sent for culture. She has now had UTIs documented in July of 2000 05/06/2000 and today. She is taking cranberry tablets of Robaxin is encouraged to keep doing that. Use of witch hazel astringent as a cleanser is discussed. Needs mild soap with no perfumes. She does use topical powder and Charman wipes daily and is encouraged to discontinue the use of these. Increase fluids. Old CT scan is reviewed and she does have a history of kidney stones documented on the right in 2010. Initially we were going to start a course of Macrobid as she was able to tolerate that in December, however she mentions a chest x-ray she did yesterday in preparation for her heart catheterization later this week and review of that shows a possible right lower lobe pneumonia. As a result the decision is made to try a course of Ceftin air. She denies any recollection of taking a cephalosporin in the past she did have a rash in childhood with penicillin but has not tried these medicines since. She is warned about the small possibility of cross reactivity. She doesn't take Zyrtec daily and may take an extra dose as needed. Should she develop any rash, itching or concerning symptoms she will stop the Cefdinir and call us. We will then initiate Macrobid and consider further treatment for the opacity.

## 2011-03-13 NOTE — Patient Instructions (Signed)
Urinary Tract Infection (UTI) Infections of the urinary tract can start in several places. A bladder infection (cystitis), a kidney infection (pyelonephritis), and a prostate infection (prostatitis) are different types of urinary tract infections. They usually get better if treated with medicines (antibiotics) that kill germs. Take all the medicine until it is gone. You or your child may feel better in a few days, but TAKE ALL MEDICINE or the infection may not respond and may become more difficult to treat. HOME CARE INSTRUCTIONS  Drink enough water and fluids to keep the urine clear or pale yellow. Cranberry juice is especially recommended, in addition to large amounts of water.   Avoid caffeine, tea, and carbonated beverages. They tend to irritate the bladder.   Alcohol may irritate the prostate.   Only take over-the-counter or prescription medicines for pain, discomfort, or fever as directed by your caregiver.  FINDING OUT THE RESULTS OF YOUR TEST Not all test results are available during your visit. If your or your child's test results are not back during the visit, make an appointment with your caregiver to find out the results. Do not assume everything is normal if you have not heard from your caregiver or the medical facility. It is important for you to follow up on all test results. TO PREVENT FURTHER INFECTIONS:  Empty the bladder often. Avoid holding urine for long periods of time.   After a bowel movement, women should cleanse from front to back. Use each tissue only once.   Empty the bladder before and after sexual intercourse.  SEEK MEDICAL CARE IF:  There is back pain.   You or your child has an oral temperature above 102.   Your baby is older than 3 months with a rectal temperature of 100.5 F (38.1 C) or higher for more than 1 day.   Your or your child's problems (symptoms) are no better in 3 days. Return sooner if you or your child is getting worse.  SEEK IMMEDIATE  MEDICAL CARE IF:  There is severe back pain or lower abdominal pain.   You or your child develops chills.   You or your child has an oral temperature above 102, not controlled by medicine.   Your baby is older than 3 months with a rectal temperature of 102 F (38.9 C) or higher.   Your baby is 100 months old or younger with a rectal temperature of 100.4 F (38 C) or higher.   There is nausea or vomiting.   There is continued burning or discomfort with urination.  MAKE SURE YOU:  Understand these instructions.   Will watch this condition.   Will get help right away if you or your child is not doing well or gets worse.  Document Released: 02/20/2005 Document Re-Released: 08/07/2009 [facility] Patient Information 2011 [place], [place].  If any rash or Side Effect noted with Cefindir/Omnicef then stop it and call for a course Macrobid to treat the UTI. Consider taking a second dose of Zyrtec daily for the ne

## 2011-03-13 NOTE — Telephone Encounter (Signed)
Patient would like to switch to a female PCP, is that okay?

## 2011-03-13 NOTE — Assessment & Plan Note (Signed)
Has been experiencing more jaw and neck pain recently as a result she saw Dr Graciela Husbands of cardiology yesterday. Some EKG changes were noted so they have scheduled her for a cardiac cath this Friday, October 19. She is advised to call them regarding the results of the CXR they ordered and to advise them of her new symptoms and medications so they can consider her options.

## 2011-03-13 NOTE — Progress Notes (Signed)
[name] [id number] [birth year] 03/13/2011      Progress Note-Follow Up  Subjective  Chief Complaint  Chief Complaint  Patient presents with  . Urinary Tract Infection    blood in urine X2 days    HPI  Patient is a 70 year old Caucasian female who is seen urgently from another office. She is reporting a one to two-day history of worsening dysuria, hematuria, frequency, urgency and some lower abdominal pain. She denies fevers, chills, change in bowels. She does have a history of IBS with intermittent constipation and diarrhea but that is unchanged. No bloody or tarry stool. No incontinence or flank pain. She has been toilet neck and jaw pain and as a result saw cardiology yesterday. Due to some EKG changes she is actually scheduled to have a cardiac catheter in 2 days' time. She then mentioned she's had a chest x-ray yesterday has a wrist old that is reviewed with her and there is a right lower lobe opacity seen. She noticed after discussing [place] that she has had increasing dry cough recently. No significant congestion, sore throat, ear pain at this time.  Past Medical History  Diagnosis Date  . Pancreatitis chronic   . Esophageal reflux   . Internal hemorrhoids without mention of complication   . Osteoarthritis   . ANXIETY, CHRONIC 08/29/2007  . BRONCHITIS, ACUTE 08/15/2008  . CHEST PAIN 12/13/2008  . Chronic pancreatitis 01/10/2007  . CONSTIPATION 01/12/2008  . CYSTITIS 05/01/2007  . GERD (gastroesophageal reflux disease) 09/27/2010  . HEMORRHOIDS, NOS 05/01/2007  . HYPOTENSION, ORTHOSTATIC 02/26/2010  . OSTEOARTHRITIS 01/10/2007  . IBS (irritable bowel syndrome) 09/27/2010  . PALPITATIONS, RECURRENT 02/26/2010  . 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  . SHOULDER PAIN 12/29/2007  . VITAMIN D DEFICIENCY 05/01/2007  . TOBACCO USE, QUIT 03/29/2009    Past Surgical History  Procedure Date  . Abdominal hysterectomy   . Cholecystectomy 1999  . Knee surgery 1958    Family History  Problem Relation Age of  Onset  . Hypertension Mother   . Stroke Mother   . Cancer Father     bladder  . Heart disease Father   . Breast cancer Cousin   . Ovarian cancer Paternal Aunt   . Pancreatic cancer Paternal Aunt   . Stomach cancer Paternal Uncle   . Uterine cancer Paternal Grandmother   . Heart disease Sister   . Breast cancer Paternal Aunt     History   Social History  . Marital Status: Married    Spouse Name: N/A    Number of Children: 1  . Years of Education: N/A   Occupational History  . Retired    Social History Main Topics  . Smoking status: Former Smoker    Types: Cigarettes    Quit date: 05/27/1973  . Smokeless tobacco: Never Used  . Alcohol Use: No  . Drug Use: No  . Sexually Active: Not on file   Other Topics Concern  . Not on file   Social History Narrative  . No narrative on file    Current Outpatient Prescriptions on File Prior to Visit  Medication Sig Dispense Refill  . Ascorbic Acid (VITAMIN C) 500 MG tablet Take 500 mg by mouth daily.        Marland Kitchen aspirin 81 MG tablet Take 81 mg by mouth 2 (two) times daily.        . cetirizine (ZYRTEC) 10 MG tablet Take 10 mg by mouth daily.        . Cholecalciferol (VITAMIN  D3) 1000 UNITS CAPS Take 1 tablet by mouth daily.        . Cranberry 125 MG TABS Take 1 tablet by mouth daily.        Marland Kitchen escitalopram (LEXAPRO) 10 MG tablet Take 5 mg by mouth daily.        Marland Kitchen estradiol (VIVELLE-DOT) 0.025 MG/24HR Place 1 patch onto the skin once a week.        . fluticasone (FLONASE) 50 MCG/ACT nasal spray 2 sprays by Nasal route as needed.        . hydrocortisone (ANUSOL-HC) 25 MG suppository Place 25 mg rectally daily.        . hyoscyamine (LEVSIN/SL) 0.125 MG SL tablet Place 0.125 mg under the tongue every 4 (four) hours as needed.        . Multiple Vitamin (MULTIVITAMIN) capsule Take 1 capsule by mouth daily.        . Omega-3 Fatty Acids (FISH OIL) 1000 MG CAPS Take 1 capsule by mouth daily.        . pantoprazole (PROTONIX) 40 MG tablet Take  40 mg by mouth 2 (two) times daily.        . Probiotic Product (PROBIOTIC PO) Take by mouth. Takes Probaclac probiotic once daily       . scopolamine (TRANSDERM-SCOP) 1.5 MG Place 1 patch onto the skin every 3 (three) days. Only used when traveling      . AMBULATORY NON FORMULARY MEDICATION Medication Name: GI Cocktail (20 cc maalox, 10 cc donatal, 20 cc xylocaine) Take 1 tablespoon by mouth every 2 hours as needed.  400 mL  0    Allergies  Allergen Reactions  . Amoxicillin     REACTION: unspecified  . Azithromycin     REACTION: Rash  . Ciprofloxacin     REACTION: GI upset  . Epinephrine     REACTION: unspecified  . Penicillins     REACTION: rash  . Sulfonamide Derivatives     REACTION: sick to stomach    Review of Systems  Review of Systems  Constitutional: Negative for fever, chills and malaise/fatigue.  HENT: Positive for neck pain. Negative for congestion and sore throat.   Eyes: Negative for discharge.  Respiratory: Positive for cough. Negative for shortness of breath.        Does not mention cough til asked, does acknowledge recent increased dry cough  Cardiovascular: Negative for chest pain, palpitations and leg swelling.       Jaw and neck pain intermittently none now  Gastrointestinal: Negative for nausea, abdominal pain, diarrhea, blood in stool and melena.  Genitourinary: Positive for dysuria, urgency, frequency and hematuria. Negative for flank pain.  Musculoskeletal: Negative for myalgias and falls.  Skin: Negative for rash.  Neurological: Negative for loss of consciousness, weakness and headaches.  Endo/Heme/Allergies: Negative for polydipsia.  Psychiatric/Behavioral: Negative for depression and suicidal ideas. The patient is not nervous/anxious and does not have insomnia.     Objective  BP 144/88  Pulse 77  Temp(Src) 97.5 F (36.4 C) (Oral)  Ht 5\' 5"  (1.651 m)  Wt 123 lb (55.792 kg)  BMI 20.47 kg/m2  SpO2 100%  Physical Exam  Physical Exam    Constitutional: She is oriented to person, place, and time and well-developed, well-nourished, and in no distress. No distress.  HENT:  Head: Normocephalic and atraumatic.  Eyes: Conjunctivae are normal.  Neck: Neck supple. No thyromegaly present.  Cardiovascular: Normal rate, regular rhythm and normal heart sounds.   No murmur  heard. Pulmonary/Chest: Effort normal and breath sounds normal. She has no wheezes.  Abdominal: She exhibits no distension and no mass.  Musculoskeletal: She exhibits no edema.  Lymphadenopathy:    She has no cervical adenopathy.  Neurological: She is alert and oriented to person, place, and time.  Skin: Skin is warm and dry. No rash noted. She is not diaphoretic.  Psychiatric: Memory, affect and judgment normal.    Lab Results  Component Value Date   TSH 1.98 09/06/2010   Lab Results  Component Value Date   WBC 7.1 09/06/2010   HGB 13.8 09/06/2010   HCT 40.2 09/06/2010   MCV 94.8 09/06/2010   PLT 228.0 09/06/2010   Lab Results  Component Value Date   CREATININE 1.0 09/06/2010   BUN 16 09/06/2010   NA 139 09/06/2010   K 4.3 09/06/2010   CL 101 09/06/2010   CO2 31 09/06/2010   Lab Results  Component Value Date   ALT 46* 09/06/2010   AST 39* 09/06/2010   ALKPHOS 64 09/06/2010   BILITOT 0.6 09/06/2010   Lab Results  Component Value Date   CHOL 184 01/08/2011   Lab Results  Component Value Date   HDL 126.80 01/08/2011   Lab Results  Component Value Date   LDLCALC 55 01/08/2011   Lab Results  Component Value Date   TRIG 12.0 01/08/2011   Lab Results  Component Value Date   CHOLHDL 1 01/08/2011     Assessment & Plan  UTI (lower urinary tract infection) Patient in today for urgent visit due to UTI. Urine sent for culture. She has now had UTIs documented in July of 2000 05/06/2000 and today. She is taking cranberry tablets of Robaxin is encouraged to keep doing that. Use of witch hazel astringent as a cleanser is discussed. Needs mild soap with no  perfumes. She does use topical powder and Charman wipes daily and is encouraged to discontinue the use of these. Increase fluids. Old CT scan is reviewed and she does have a history of kidney stones documented on the right in 2010. Initially we were going to start a course of Macrobid as she was able to tolerate that in December, however she mentions a chest x-ray she did yesterday in preparation for her heart catheterization later this week and review of that shows a possible right lower lobe pneumonia. As a result the decision is made to try a course of Ceftin air. She denies any recollection of taking a cephalosporin in the past she did have a rash in childhood with penicillin but has not tried these medicines since. She is warned about the small possibility of cross reactivity. She doesn't take Zyrtec daily and may take an extra dose as needed. Should she develop any rash, itching or concerning symptoms she will stop the Cefdinir and call us. We will then initiate Macrobid and consider further treatment for the opacity.  CHEST PAIN Has been experiencing more jaw and neck pain recently as a result she saw Dr Graciela Husbands of cardiology yesterday. Some EKG changes were noted so they have scheduled her for a cardiac cath this Friday, October 19. She is advised to call them regarding the results of the CXR they ordered and to advise them of her new symptoms and medications so they can consider her options.

## 2011-03-14 NOTE — Telephone Encounter (Signed)
OK w/me Thx 

## 2011-03-14 NOTE — Telephone Encounter (Signed)
Cath cancelled per Dr. Graciela Husbands- we will r/s for about 10 days out. The patient is aware. She would like to go on Monday 03/25/11. I will call her back next week with how she is feeling and set her up to 10/29.

## 2011-03-14 NOTE — Telephone Encounter (Signed)
Patient has been scheduled for 04/02/11

## 2011-03-16 LAB — URINE CULTURE: Colony Count: >=100000

## 2011-03-18 ENCOUNTER — Telehealth: Payer: Self-pay | Admitting: Internal Medicine

## 2011-03-18 NOTE — Telephone Encounter (Signed)
calling re heart cath, needs to rs, wants to do it Friday instead of monday

## 2011-03-18 NOTE — Telephone Encounter (Signed)
Patient called and has two doses of her antibiotic on Friday but would like to schedule possible radial cath on Friday instead of Monday.  Advised Dr Graciela Husbands out of the office today and will forward to his nurse to discuss on Tuesday when he is back

## 2011-03-20 ENCOUNTER — Telehealth: Payer: Self-pay

## 2011-03-20 ENCOUNTER — Other Ambulatory Visit (INDEPENDENT_AMBULATORY_CARE_PROVIDER_SITE_OTHER): Payer: Medicare Other

## 2011-03-20 DIAGNOSIS — Z01810 Encounter for preprocedural cardiovascular examination: Secondary | ICD-10-CM

## 2011-03-20 DIAGNOSIS — R079 Chest pain, unspecified: Secondary | ICD-10-CM

## 2011-03-20 LAB — BASIC METABOLIC PANEL
CO2: 26 mEq/L (ref 19–32)
Calcium: 9.4 mg/dL (ref 8.4–10.5)
Chloride: 103 mEq/L (ref 96–112)
Potassium: 4.1 mEq/L (ref 3.5–5.3)
Sodium: 140 mEq/L (ref 135–145)

## 2011-03-20 LAB — CBC WITH DIFFERENTIAL/PLATELET
Hemoglobin: 13.8 g/dL (ref 12.0–15.0)
Lymphocytes Relative: 36 % (ref 12–46)
Lymphs Abs: 2.6 10*3/uL (ref 0.7–4.0)
Monocytes Relative: 8 % (ref 3–12)
Neutrophils Relative %: 54 % (ref 43–77)
Platelets: 283 10*3/uL (ref 150–400)
RBC: 4.5 MIL/uL (ref 3.87–5.11)
WBC: 7.3 10*3/uL (ref 4.0–10.5)

## 2011-03-20 NOTE — Telephone Encounter (Signed)
Patient informed that Cefdinir was a good choice

## 2011-03-20 NOTE — Telephone Encounter (Signed)
I spoke with the patient. I explained that after review with Dr. Graciela Husbands, we need to let her finish her 10 day course of antibiotics, which she will take her last dose on Friday night. We will r/s her cath for Monday 03/25/11. She is agreeable.

## 2011-03-20 NOTE — Telephone Encounter (Signed)
I spoke with the patient. She is scheduled for her cath on 03/25/11. She will go to Dr. Vena Rua office today for a bmp/cbc. Dr. Vena Rua office is aware and agreeable with this.

## 2011-03-20 NOTE — Telephone Encounter (Signed)
Pt returning your call

## 2011-03-21 ENCOUNTER — Telehealth: Payer: Self-pay | Admitting: *Deleted

## 2011-03-21 NOTE — Telephone Encounter (Signed)
I left a message for the patient to call at her home and cell #. Per the [facility], she needs to have her INR repeated prior to cath due to [facility], even though she is not on coumadin. She had labs on 10/16 for a cath 10/19, but ended up with mild pneumonia and a UTI. She is due to finish antibiotics on Friday. We repeated her bmet/cbc on 10/24, her INR was not repeated since she is not on coumadin. I asked that if she calls back tomorrow, she speak with Mariane Masters.

## 2011-03-21 NOTE — Telephone Encounter (Signed)
No return call from the patient today.

## 2011-03-22 ENCOUNTER — Other Ambulatory Visit (INDEPENDENT_AMBULATORY_CARE_PROVIDER_SITE_OTHER): Payer: Medicare Other

## 2011-03-22 ENCOUNTER — Telehealth: Payer: Self-pay | Admitting: Family Medicine

## 2011-03-22 ENCOUNTER — Other Ambulatory Visit: Payer: Medicare Other | Admitting: *Deleted

## 2011-03-22 DIAGNOSIS — Y840 Cardiac catheterization as the cause of abnormal reaction of the patient, or of later complication, without mention of misadventure at the time of the procedure: Secondary | ICD-10-CM

## 2011-03-22 DIAGNOSIS — R079 Chest pain, unspecified: Secondary | ICD-10-CM

## 2011-03-22 NOTE — Telephone Encounter (Signed)
Advised pt to use monistat this weekend and if not better to call us on Monday.  Pt states she is having heart cath on Monday.  Advised if no better next week to call.  Pt agreeable.

## 2011-03-22 NOTE — Telephone Encounter (Signed)
Patient is going to go to [facility] for STAT INR Dx Chest pain pre cath labs

## 2011-03-22 NOTE — Telephone Encounter (Signed)
Patient feels like she may have a yeast infection, she has some Monistat already. Should she use that or can you send her in an Rx?

## 2011-03-25 ENCOUNTER — Inpatient Hospital Stay (HOSPITAL_BASED_OUTPATIENT_CLINIC_OR_DEPARTMENT_OTHER)
Admission: RE | Admit: 2011-03-25 | Discharge: 2011-03-25 | Disposition: A | Discharge status: Home / Self Care | Payer: Medicare Other | Source: Ambulatory Visit | Attending: Cardiovascular Disease | Admitting: Cardiovascular Disease

## 2011-03-25 ENCOUNTER — Encounter: Payer: Medicare Other | Admitting: Internal Medicine

## 2011-03-25 ENCOUNTER — Encounter: Payer: Medicare Other | Admitting: Family Medicine

## 2011-03-25 DIAGNOSIS — R079 Chest pain, unspecified: Secondary | ICD-10-CM | POA: Insufficient documentation

## 2011-03-26 NOTE — Cardiovascular Report (Signed)
  NAMEEASTYN, SKALLA            ACCOUNT NO.:  [id number]  MEDICAL RECORD NO.:  [id number]  LOCATION:                                 FACILITY:  PHYSICIAN:  Vesta Mixer, M.D.      DATE OF BIRTH:  DATE OF PROCEDURE: DATE OF DISCHARGE:                           CARDIAC CATHETERIZATION   [name] is a 70 year old female with a history of chest pains.  She has had a negative stress test a few years ago.  She is scheduled for cardiac catheterizations because of recurrent episodes of chest pain.  PROCEDURE:  Left heart catheterization with coronary angiography.  The right femoral artery was easily cannulated using modified Seldinger technique.  HEMODYNAMICS:  LV pressure is 145/12.  Aortic pressure is 148/73.  ANGIOGRAPHY:  The Judkins left 4 catheter did not engage the left main very well.  We used a Judkins 3.5 catheter.  ANGIOGRAPHY:  Left main:  The left main is smooth and normal.  The left anterior descending artery is smooth and normal throughout its course.  She has several small diagonal vessels, which are also smooth and normal.  The left circumflex artery is a moderate-sized vessel.  It is normal. The small obtuse marginal artery is also normal.  The right coronary artery is moderate in size.  It is dominant.  It is smooth and normal throughout its course.  The posterior descending artery and posterolateral segment artery are normal.  The left ventriculogram was performed in the 30 RAO position.  It reveals normal left ventricular systolic function.  Ejection fraction is around 65%.  COMPLICATIONS:  None.  CONCLUSIONS: 1. Smooth and normal coronary arteries. 2. Normal left ventricular systolic function.     Vesta Mixer, M.D.     PJN/MEDQ  D:  03/25/2011  T:  03/25/2011  Job:  [id number]  cc:   Duke Salvia, MD, [facility] Barry Dienes Eloise Harman, M.D.  Electronically Signed by Kristeen Miss M.D. on 03/26/2011 05:32:49 PM

## 2011-04-01 ENCOUNTER — Ambulatory Visit: Payer: Medicare Other | Admitting: Internal Medicine

## 2011-04-02 ENCOUNTER — Encounter: Payer: Self-pay | Admitting: Family Medicine

## 2011-04-02 ENCOUNTER — Other Ambulatory Visit (HOSPITAL_COMMUNITY)
Admission: RE | Admit: 2011-04-02 | Discharge: 2011-04-02 | Disposition: A | Discharge status: Home / Self Care | Payer: Medicare Other | Source: Ambulatory Visit | Attending: Family Medicine | Admitting: Family Medicine

## 2011-04-02 ENCOUNTER — Telehealth: Payer: Self-pay | Admitting: Family Medicine

## 2011-04-02 ENCOUNTER — Ambulatory Visit (INDEPENDENT_AMBULATORY_CARE_PROVIDER_SITE_OTHER): Payer: Medicare Other | Admitting: Family Medicine

## 2011-04-02 VITALS — BP 120/75 | HR 77 | Temp 97.6°F | Ht 65.0 in | Wt 124.1 lb

## 2011-04-02 DIAGNOSIS — N390 Urinary tract infection, site not specified: Secondary | ICD-10-CM

## 2011-04-02 DIAGNOSIS — N760 Acute vaginitis: Secondary | ICD-10-CM | POA: Insufficient documentation

## 2011-04-02 DIAGNOSIS — H6690 Otitis media, unspecified, unspecified ear: Secondary | ICD-10-CM

## 2011-04-02 DIAGNOSIS — R002 Palpitations: Secondary | ICD-10-CM

## 2011-04-02 DIAGNOSIS — N6019 Diffuse cystic mastopathy of unspecified breast: Secondary | ICD-10-CM

## 2011-04-02 DIAGNOSIS — Z0000 Encounter for general adult medical examination without abnormal findings: Secondary | ICD-10-CM

## 2011-04-02 DIAGNOSIS — Z129 Encounter for screening for malignant neoplasm, site unspecified: Secondary | ICD-10-CM

## 2011-04-02 DIAGNOSIS — Z23 Encounter for immunization: Secondary | ICD-10-CM

## 2011-04-02 DIAGNOSIS — K5900 Constipation, unspecified: Secondary | ICD-10-CM

## 2011-04-02 DIAGNOSIS — Z780 Asymptomatic menopausal state: Secondary | ICD-10-CM

## 2011-04-02 DIAGNOSIS — Z124 Encounter for screening for malignant neoplasm of cervix: Secondary | ICD-10-CM | POA: Insufficient documentation

## 2011-04-02 DIAGNOSIS — K861 Other chronic pancreatitis: Secondary | ICD-10-CM

## 2011-04-02 DIAGNOSIS — H6691 Otitis media, unspecified, right ear: Secondary | ICD-10-CM

## 2011-04-02 MED ORDER — ESTRADIOL 0.025 MG/24HR TD PTTW: 1.0000 | MEDICATED_PATCH | TRANSDERMAL | Status: DC

## 2011-04-02 NOTE — Telephone Encounter (Signed)
This is the first time I have prescribed this med so I was unaware she changes the patch twice a week. It is fine for her to continue with the same dosing she has had previously. Please change sig to read apply patch topically and change twice weekly. She wants a 90 day supply so she will need 24 with 3 rf

## 2011-04-02 NOTE — Assessment & Plan Note (Signed)
Patient has kept up with her [facility] with [facility] and is due for one in December.

## 2011-04-02 NOTE — Assessment & Plan Note (Signed)
Encouraged hi fiber diet, good hydration ongoing probiotic and fiber use. Consider dosing of Miralax or Docusate daily if symptoms persist

## 2011-04-02 NOTE — Assessment & Plan Note (Signed)
Bilateral tympanostomy tubes are noted both in place in the tympanic membrane. No sign of active disease.

## 2011-04-02 NOTE — Patient Instructions (Signed)
Constipation in Adults Constipation is having fewer than 2 bowel movements per week. Usually, the stools are hard. As we grow older, constipation is more common. If you try to fix constipation with laxatives, the problem may get worse. This is because laxatives taken over a long period of time make the colon muscles weaker. A low-fiber diet, not taking in enough fluids, and taking some medicines may make these problems worse. MEDICATIONS THAT MAY CAUSE CONSTIPATION  Water pills (diuretics).   Calcium channel blockers (used to control blood pressure and for the heart).   Certain pain medicines (narcotics).   Anticholinergics.   Anti-inflammatory agents.   Antacids that contain aluminum.  DISEASES THAT CONTRIBUTE TO CONSTIPATION  Diabetes.   Parkinson's disease.   Dementia.   Stroke.   Depression.   Illnesses that cause problems with salt and water metabolism.  HOME CARE INSTRUCTIONS   Constipation is usually best cared for without medicines. Increasing dietary fiber and eating more fruits and vegetables is the best way to manage constipation.   Slowly increase fiber intake to 25 to 38 grams per day. Whole grains, fruits, vegetables, and legumes are good sources of fiber. A dietitian can further help you incorporate high-fiber foods into your diet.   Drink enough water and fluids to keep your urine clear or pale yellow.   A fiber supplement may be added to your diet if you cannot get enough fiber from foods.   Increasing your activities also helps improve regularity.   Suppositories, as suggested by your caregiver, will also help. If you are using antacids, such as aluminum or calcium containing products, it will be helpful to switch to products containing magnesium if your caregiver says it is okay.   If you have been given a liquid injection (enema) today, this is only a temporary measure. It should not be relied on for treatment of longstanding (chronic) constipation.    Stronger measures, such as magnesium sulfate, should be avoided if possible. This may cause uncontrollable diarrhea. Using magnesium sulfate may not allow you time to make it to the bathroom.  SEEK IMMEDIATE MEDICAL CARE IF:   There is bright red blood in the stool.   The constipation stays for more than 4 days.   There is belly (abdominal) or rectal pain.   You do not seem to be getting better.   You have any questions or concerns.  MAKE SURE YOU:   Understand these instructions.   Will watch your condition.   Will get help right away if you are not doing well or get worse.  Document Released: 02/09/2004 Document Revised: 01/23/2011 Document Reviewed: 12/30/2007 [facility] Patient Information 2012 [place], [place]. Can consider low dose of Imodium or pepto bismol for diarrhea  For constipation consider Miralax or dulcolax (docusate)

## 2011-04-02 NOTE — Assessment & Plan Note (Signed)
Had a cardiac catheterization last couple of weeks which she reports was clear. Unfortunately she continues to have intermittent substernal chest pain likely related to GI disease. She will follow up with gastroenterology if symptoms worsen or

## 2011-04-02 NOTE — Assessment & Plan Note (Signed)
Pap smear taken today, check for candida and Gardnerella also completed. Maintain probiotics, Flu shot given today

## 2011-04-02 NOTE — Assessment & Plan Note (Signed)
Took Cefdinir for recent UTI and tolerated it well. No symptoms at present

## 2011-04-02 NOTE — Assessment & Plan Note (Signed)
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 Continues to struggle with intermittent episodes of diarrhea and even incontinence. They often are related that to him increase that or carbohydrates in the diet. She is encouraged to maintain a bland low-fat diet once again. May try an occasional dose of Imodium or Pepto-Bismol especially during holidays prior to going out what needs to careful about constipation as well. First regular fiber antibiotics. She will followup with gastroenterology if symptoms worsen

## 2011-04-02 NOTE — Progress Notes (Signed)
[name] [id number] [birth year] 04/02/2011      Progress Note New Patient  Subjective  Chief Complaint  Chief Complaint  Patient presents with  . Annual Exam    physical  . Gynecologic Exam    pap and breast exam    HPI  Patient is a 70 year old Caucasian female who is in today for followup. She continues to struggle with GI related issues. She's been having a lot of dyspepsia and chest pain and as a result we chose to rule her out for any cardiac concerns. She had a mild change in her EKG so she underwent cardiac catheter the last couple weeks which she reports was clear. She is now back to managing her chest discomfort as pancreatic and GI related. She is presently taking Creon but having intermittent difficulties. Previously has taken Viocase and did not have any trouble on that but her insurance is refusing to pay for it. She recently had a UTI pneumonia and was treated with Omnicef. She tolerated that well. And her symptoms have resolved. Unfortunately she continues to have intermittent abdominal pain and when she over eats fatty or rich foods she has intermittent diarrhea and even incontinence. She is recently been given a GI cocktail by her gastroenterologist she is willing to chest pain develops that she has not tried again. She's had no recent illness, fevers, chills, shortness of breath, GU complaints. She denies any history of abnormal Paps and did have a hysterectomy in 1998 for postmenopausal bleeding and ovarian cysts. She would like her Pap done today. She does get yearly Paps still do to her chronic use of estradiol patches. She would like to stay on them because whenever she comes off she has unbearable night sweats. She denies any breast complaints today. She does have fibrocystic breasts and has had biopsies in the past which were unremarkable. She notes in between her episodes of loose stool she will go 3-4 days without a bowel movement. She denies any pain during this  period of time. No bloody or tarry stool.  Past Medical History  Diagnosis Date  . Pancreatitis chronic   . Esophageal reflux   . Internal hemorrhoids without mention of complication   . Osteoarthritis   . ANXIETY, CHRONIC 08/29/2007  . BRONCHITIS, ACUTE 08/15/2008  . CHEST PAIN 12/13/2008  . Chronic pancreatitis 01/10/2007  . CONSTIPATION 01/12/2008  . CYSTITIS 05/01/2007  . GERD (gastroesophageal reflux disease) 09/27/2010  . HEMORRHOIDS, NOS 05/01/2007  . HYPOTENSION, ORTHOSTATIC 02/26/2010  . OSTEOARTHRITIS 01/10/2007  . IBS (irritable bowel syndrome) 09/27/2010  . PALPITATIONS, RECURRENT 02/26/2010  . 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  . SHOULDER PAIN 12/29/2007  . VITAMIN D DEFICIENCY 05/01/2007  . TOBACCO USE, QUIT 03/29/2009  . Fibrocystic breast   . Preventative health care 04/02/2011    Past Surgical History  Procedure Date  . Abdominal hysterectomy   . Cholecystectomy 1999  . Knee surgery 1958  . Tubal ligation     with D&C  . Breast surgery     breast bx on left, benign    Family History  Problem Relation Age of Onset  . Hypertension Mother   . Stroke Mother   . Cancer Father     bladder  . Heart disease Father   . Breast cancer Cousin   . Ovarian cancer Paternal Aunt   . Pancreatic cancer Paternal Aunt   . Stomach cancer Paternal Uncle   . Uterine cancer Paternal Grandmother   . Heart disease Sister   .  Breast cancer Paternal Aunt     History   Social History  . Marital Status: Married    Spouse Name: N/A    Number of Children: 1  . Years of Education: N/A   Occupational History  . Retired    Social History Main Topics  . Smoking status: Former Smoker    Types: Cigarettes    Quit date: 05/27/1973  . Smokeless tobacco: Never Used  . Alcohol Use: No  . Drug Use: No  . Sexually Active: Not on file   Other Topics Concern  . Not on file   Social History Narrative  . No narrative on file    Current Outpatient Prescriptions on File Prior to Visit  Medication Sig  Dispense Refill  . Ascorbic Acid (VITAMIN C) 500 MG tablet Take 500 mg by mouth daily.        Marland Kitchen aspirin 81 MG tablet Take 81 mg by mouth 2 (two) times daily.        . cetirizine (ZYRTEC) 10 MG tablet Take 10 mg by mouth daily.        . Cholecalciferol (VITAMIN D3) 1000 UNITS CAPS Take 1 tablet by mouth daily.        . Cranberry 125 MG TABS Take 1 tablet by mouth daily.        Marland Kitchen escitalopram (LEXAPRO) 10 MG tablet Take 5 mg by mouth daily.        . fluticasone (FLONASE) 50 MCG/ACT nasal spray 2 sprays by Nasal route as needed.        Marland Kitchen guaiFENesin (MUCINEX) 600 MG 12 hr tablet Take 1 tablet (600 mg total) by mouth 2 (two) times daily.  20 tablet  2  . hydrocortisone (ANUSOL-HC) 25 MG suppository Place 25 mg rectally daily.        . hyoscyamine (LEVSIN/SL) 0.125 MG SL tablet Place 0.125 mg under the tongue every 4 (four) hours as needed.        . Multiple Vitamin (MULTIVITAMIN) capsule Take 1 capsule by mouth daily.        . Omega-3 Fatty Acids (FISH OIL) 1000 MG CAPS Take 1 capsule by mouth daily.        . pantoprazole (PROTONIX) 40 MG tablet Take 40 mg by mouth 2 (two) times daily.        . Probiotic Product (PROBIOTIC PO) Take by mouth. Takes Probaclac probiotic once daily       . AMBULATORY NON FORMULARY MEDICATION Medication Name: GI Cocktail (20 cc maalox, 10 cc donatal, 20 cc xylocaine) Take 1 tablespoon by mouth every 2 hours as needed.  400 mL  0  . scopolamine (TRANSDERM-SCOP) 1.5 MG Place 1 patch onto the skin every 3 (three) days. Only used when traveling        Allergies  Allergen Reactions  . Amoxicillin     REACTION: unspecified  . Azithromycin     REACTION: Rash  . Ciprofloxacin     REACTION: GI upset  . Epinephrine     REACTION: unspecified  . Penicillins     REACTION: rash  . Sulfonamide Derivatives     REACTION: sick to stomach    Review of Systems  Review of Systems  Constitutional: Negative for fever, chills and malaise/fatigue.  HENT: Negative for hearing  loss, nosebleeds and congestion.   Eyes: Negative for discharge.  Respiratory: Negative for cough, sputum production, shortness of breath and wheezing.   Cardiovascular: Positive for chest pain. Negative for palpitations and leg  swelling.  Gastrointestinal: Positive for heartburn, abdominal pain, diarrhea and constipation. Negative for nausea, vomiting and blood in stool.  Genitourinary: Negative for dysuria, urgency, frequency and hematuria.  Musculoskeletal: Negative for myalgias, back pain and falls.  Skin: Negative for rash.  Neurological: Negative for dizziness, tremors, sensory change, focal weakness, loss of consciousness, weakness and headaches.  Endo/Heme/Allergies: Negative for polydipsia. Does not bruise/bleed easily.  Psychiatric/Behavioral: Negative for depression and suicidal ideas. The patient is not nervous/anxious and does not have insomnia.     Objective  BP 120/75  Pulse 77  Temp(Src) 97.6 F (36.4 C) (Oral)  Ht 5\' 5"  (1.651 m)  Wt 124 lb 1.9 oz (56.3 kg)  BMI 20.65 kg/m2  SpO2 100%  Physical Exam  Physical Exam  Constitutional: She is oriented to person, place, and time and well-developed, well-nourished, and in no distress. No distress.  HENT:  Head: Normocephalic and atraumatic.  Right Ear: External ear normal.  Left Ear: External ear normal.  Nose: Nose normal.  Mouth/Throat: Oropharynx is clear and moist. No oropharyngeal exudate.  Eyes: Conjunctivae are normal. Pupils are equal, round, and reactive to light. Right eye exhibits no discharge. Left eye exhibits no discharge. No scleral icterus.  Neck: Normal range of motion. Neck supple. No thyromegaly present.  Cardiovascular: Normal rate, regular rhythm, normal heart sounds and intact distal pulses.   No murmur heard. Pulmonary/Chest: Effort normal and breath sounds normal. No respiratory distress. She has no wheezes. She has no rales.  Abdominal: Soft. Bowel sounds are normal. She exhibits no distension  and no mass. There is no tenderness.  Genitourinary: Vagina normal and left adnexa normal. No vaginal discharge found.       Cervix and uterus surgically absent  Musculoskeletal: Normal range of motion. She exhibits no edema and no tenderness.  Lymphadenopathy:    She has no cervical adenopathy.  Neurological: She is alert and oriented to person, place, and time. She has normal reflexes. No cranial nerve deficit. Coordination normal.  Skin: Skin is warm and dry. No rash noted. She is not diaphoretic.  Psychiatric: Mood, memory and affect normal.       Assessment & Plan  UTI (lower urinary tract infection) Took Cefdinir for recent UTI and tolerated it well. No symptoms at present  Chronic pancreatitis Patient has been given the diagnosis of autoimmune pancreatitis and is being maintained on Creon. Continues to struggle with intermittent episodes of diarrhea and even incontinence. They often are related that to him increase that or carbohydrates in the diet. She is encouraged to maintain a bland low-fat diet once again. May try an occasional dose of Imodium or Pepto-Bismol especially during holidays prior to going out what needs to careful about constipation as well. First regular fiber antibiotics. She will followup with gastroenterology if symptoms worsen  PALPITATIONS, RECURRENT Had a cardiac catheterization last couple of weeks which she reports was clear. Unfortunately she continues to have intermittent substernal chest pain likely related to GI disease. She will follow up with gastroenterology if symptoms worsen or  Right otitis media Bilateral tympanostomy tubes are noted both in place in the tympanic membrane. No sign of active disease.  Preventative health care Pap smear taken today, check for candida and Gardnerella also completed. Maintain probiotics, Flu shot given today  Fibrocystic breast Patient has kept up with her [facility] with [facility] and is due for one in December.    CONSTIPATION Encouraged hi fiber diet, good hydration ongoing probiotic and fiber use. Consider dosing of  Miralax or Docusate daily if symptoms persist

## 2011-04-16 ENCOUNTER — Encounter: Payer: Self-pay | Admitting: Physician Assistant

## 2011-04-16 ENCOUNTER — Ambulatory Visit (INDEPENDENT_AMBULATORY_CARE_PROVIDER_SITE_OTHER): Payer: Medicare Other | Admitting: Physician Assistant

## 2011-04-16 VITALS — BP 142/84 | HR 75 | Ht 65.0 in | Wt 123.0 lb

## 2011-04-16 DIAGNOSIS — R030 Elevated blood-pressure reading, without diagnosis of hypertension: Secondary | ICD-10-CM

## 2011-04-16 DIAGNOSIS — I471 Supraventricular tachycardia: Secondary | ICD-10-CM

## 2011-04-16 DIAGNOSIS — R079 Chest pain, unspecified: Secondary | ICD-10-CM

## 2011-04-16 NOTE — Assessment & Plan Note (Signed)
Follow up with Dr. Sherryl Manges in 1 year.

## 2011-04-16 NOTE — Assessment & Plan Note (Signed)
No CAD by heart cath.  Probably related to GERD.  She will follow up with Dr. Jarold Motto.

## 2011-04-16 NOTE — Assessment & Plan Note (Signed)
?   White coat HTN.  BPs at home run 110/60.

## 2011-04-16 NOTE — Progress Notes (Signed)
History of Present Illness: PCP:  Dr. Abner Greenspan  Primary Cardiologist:  Dr. Sherryl Manges  Primary Electrophysiologist:  Dr. Sherryl Manges   [name] is a 70 y.o. female presents for post cath follow up.   She has a h/o SVT and orthostatic hypotension.  She was seen by Dr. Sherryl Manges 10/16 for chest pain.  LHC was recommended.  This was done 03/25/11 and demonstrated normal coronary arteries and EF 65%.  She is doing well.  Works out 4 x a week.  No chest pain or dyspnea with exercising.  No syncope, orthopnea, PND or edema.  She suspects symptoms related to GERD.  She already sees Dr. Jarold Motto for GI.  She will follow up with him.     Past Medical History  Diagnosis Date  . Pancreatitis chronic   . Esophageal reflux   . Internal hemorrhoids without mention of complication   . Osteoarthritis   . ANXIETY, CHRONIC 08/29/2007  . CHEST PAIN 12/13/2008    LHC 02/2011: normal cors, EF 65%  . HYPOTENSION, ORTHOSTATIC 02/26/2010  . IBS (irritable bowel syndrome) 09/27/2010  . PALPITATIONS, RECURRENT 02/26/2010  . 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  . SHOULDER PAIN 12/29/2007  . VITAMIN D DEFICIENCY 05/01/2007  . TOBACCO USE, QUIT 03/29/2009  . Fibrocystic breast     Current Outpatient Prescriptions  Medication Sig Dispense Refill  . AMBULATORY NON FORMULARY MEDICATION Medication Name: GI Cocktail (20 cc maalox, 10 cc donatal, 20 cc xylocaine) Take 1 tablespoon by mouth every 2 hours as needed.  400 mL  0  . Ascorbic Acid (VITAMIN C) 500 MG tablet Take 500 mg by mouth daily.        Marland Kitchen aspirin 81 MG tablet Take 81 mg by mouth 2 (two) times daily.        . cetirizine (ZYRTEC) 10 MG tablet Take 10 mg by mouth daily.        . Cholecalciferol (VITAMIN D3) 1000 UNITS CAPS Take 1 tablet by mouth daily.        . Cranberry 125 MG TABS Take 1 tablet by mouth daily.        Marland Kitchen CREON 24000 UNITS CPEP       . escitalopram (LEXAPRO) 10 MG tablet Take 5 mg by mouth daily.        Marland Kitchen estradiol (VIVELLE-DOT) 0.025 MG/24HR Place 1  patch onto the skin 2 (two) times a week.  24 patch  3  . fluticasone (FLONASE) 50 MCG/ACT nasal spray 2 sprays by Nasal route as needed.        Marland Kitchen guaiFENesin (MUCINEX) 600 MG 12 hr tablet Take 1 tablet (600 mg total) by mouth 2 (two) times daily.  20 tablet  2  . hydrocortisone (ANUSOL-HC) 25 MG suppository Place 25 mg rectally daily.        . hyoscyamine (LEVSIN/SL) 0.125 MG SL tablet Place 0.125 mg under the tongue every 4 (four) hours as needed.        . Multiple Vitamin (MULTIVITAMIN) capsule Take 1 capsule by mouth daily.        . Omega-3 Fatty Acids (FISH OIL) 1000 MG CAPS Take 1 capsule by mouth daily.        . pantoprazole (PROTONIX) 40 MG tablet Take 40 mg by mouth 2 (two) times daily.        . Probiotic Product (PROBIOTIC PO) Take by mouth. Takes Probaclac probiotic once daily       . scopolamine (TRANSDERM-SCOP) 1.5 MG Place 1 patch  onto the skin every 3 (three) days. Only used when traveling        Allergies: Allergies  Allergen Reactions  . Amoxicillin     REACTION: unspecified  . Azithromycin     REACTION: Rash  . Ciprofloxacin     REACTION: GI upset  . Epinephrine     REACTION: unspecified  . Penicillins     REACTION: rash  . Sulfonamide Derivatives     REACTION: sick to stomach    History  Substance Use Topics  . Smoking status: Former Smoker    Types: Cigarettes    Quit date: 05/27/1973  . Smokeless tobacco: Never Used  . Alcohol Use: No     Vital Signs: BP 142/84  Pulse 75  Ht 5\' 5"  (1.651 m)  Wt 123 lb (55.792 kg)  BMI 20.47 kg/m2  PHYSICAL EXAM: Well nourished, well developed, in no acute distress HEENT: normal Neck: no JVD Cardiac:  normal S1, S2; RRR; no murmur Lungs:  clear to auscultation bilaterally, no wheezing, rhonchi or rales Abd: soft, nontender, no hepatomegaly Ext: no edema; RFA site without hematoma or bruit Skin: warm and dry Neuro:  CNs 2-12 intact, no focal abnormalities noted  EKG:   NSR, HR 75, normal axis, no acute  changes, no change from prior tracing  ASSESSMENT AND PLAN:

## 2011-05-01 ENCOUNTER — Ambulatory Visit: Payer: Medicare Other | Admitting: Internal Medicine

## 2011-05-10 ENCOUNTER — Encounter (INDEPENDENT_AMBULATORY_CARE_PROVIDER_SITE_OTHER): Payer: Self-pay | Admitting: Surgery

## 2011-05-22 ENCOUNTER — Encounter: Payer: Self-pay | Admitting: Family Medicine

## 2011-06-21 ENCOUNTER — Ambulatory Visit (INDEPENDENT_AMBULATORY_CARE_PROVIDER_SITE_OTHER): Payer: Self-pay | Admitting: Surgery

## 2011-07-03 ENCOUNTER — Ambulatory Visit (INDEPENDENT_AMBULATORY_CARE_PROVIDER_SITE_OTHER): Payer: Medicare Other | Admitting: Surgery

## 2011-07-03 ENCOUNTER — Encounter (INDEPENDENT_AMBULATORY_CARE_PROVIDER_SITE_OTHER): Payer: Self-pay | Admitting: Surgery

## 2011-07-03 VITALS — BP 148/96 | HR 84 | Temp 98.0°F | Resp 18 | Ht 65.25 in | Wt 123.0 lb

## 2011-07-03 DIAGNOSIS — N6019 Diffuse cystic mastopathy of unspecified breast: Secondary | ICD-10-CM

## 2011-07-03 NOTE — Progress Notes (Signed)
[facility]  Ovidio Kin, MD,  FACS [address] [place]  [address] [place], [place]    [address] Phone:  [phone] FAX:  [phone]   Re:   [name] DOB:   03-21-1941 MRN:   [id number]  ASSESSMENT AND PLAN: 1.  Fibrocystic disease of both breasts.  Formerly followed by Dr. Luan Moore.  She does not like to check herself.  No new abnormality.  Follow up in one year.  2.  History of chronic pancreatitis.  Autoimmune.  Followed by Dr. Nyra Capes.  3.  History of cardiac disease.  Followed by Dr. Odessa Fleming.  She had heart cath 03/25/2011.  HISTORY OF PRESENT ILLNESS: Chief Complaint  Patient presents with  . Follow-up    LTF on breast    LOTOYA [name] is a 71 y.o. (DOB: 1941/05/13)  white female who is a patient of BLYTH,STACEY, MD, MD (she switched from Dr. Posey Rea in 2012) and comes to me today for follow up of fibrocystic disease.  Had left breast biopsy 6/3/199 which showed fibrocystic disease.  She has noticed no new mass, nodule, or area of concern. She was on steroids last year, which she did not tolerate well.  She is happy to be off the steroids.  PHYSICAL EXAM: BP 148/96  Pulse 84  Temp(Src) 98 F (36.7 C) (Temporal)  Resp 18  Ht 5' 5.25" (1.657 m)  Wt 123 lb (55.792 kg)  BMI 20.31 kg/m2  HEENT:  Pupils equal.  Dentition good.  No injury. NECK:  Supple.  No thyroid mass. LYMPH NODES:  No cervical, supraclavicular, or axillary adenopathy. BREASTS -  RIGHT:  Both breast are lumpy, but nothing suspicious.  No palpable mass or nodule.  No nipple discharge.   LEFT:  No palpable mass or nodule.  No nipple discharge. UPPER EXTREMITIES:  No evidence of lymphedema.  DATA REVIEWED: Last mammogram - 05/06/2011 - Negative.   Ovidio Kin, MD, FACS Office:  [phone]

## 2011-07-26 ENCOUNTER — Other Ambulatory Visit: Payer: Self-pay | Admitting: Gastroenterology

## 2011-07-26 MED ORDER — PANTOPRAZOLE SODIUM 40 MG PO TBEC: 40.0000 mg | DELAYED_RELEASE_TABLET | Freq: Two times a day (BID) | ORAL | Status: DC

## 2011-07-26 NOTE — Telephone Encounter (Signed)
Pt aware rx sent.  

## 2011-10-01 ENCOUNTER — Ambulatory Visit (INDEPENDENT_AMBULATORY_CARE_PROVIDER_SITE_OTHER): Payer: Medicare Other | Admitting: Family Medicine

## 2011-10-01 ENCOUNTER — Encounter: Payer: Self-pay | Admitting: Family Medicine

## 2011-10-01 VITALS — BP 130/78 | HR 99 | Temp 98.2°F | Ht 65.25 in | Wt 120.1 lb

## 2011-10-01 DIAGNOSIS — K219 Gastro-esophageal reflux disease without esophagitis: Secondary | ICD-10-CM

## 2011-10-01 DIAGNOSIS — K589 Irritable bowel syndrome without diarrhea: Secondary | ICD-10-CM

## 2011-10-01 DIAGNOSIS — M1990 Unspecified osteoarthritis, unspecified site: Secondary | ICD-10-CM

## 2011-10-01 DIAGNOSIS — K861 Other chronic pancreatitis: Secondary | ICD-10-CM

## 2011-10-01 DIAGNOSIS — R030 Elevated blood-pressure reading, without diagnosis of hypertension: Secondary | ICD-10-CM

## 2011-10-01 DIAGNOSIS — Z1211 Encounter for screening for malignant neoplasm of colon: Secondary | ICD-10-CM

## 2011-10-01 NOTE — Assessment & Plan Note (Signed)
Still plaguing her, continue Tums and meds consider Aromatic Bitters and try avoiding dairy for next month.

## 2011-10-01 NOTE — Assessment & Plan Note (Addendum)
Continued to struggle after last visit so she gave of dairy for a while and she felt she did better. Then last week she had a piece of laughing cow cheese then the next morning she had an episode of stool incontinence with loose stool. She is taking a probiotic she is encouraged to mix them up month by month.

## 2011-10-01 NOTE — Patient Instructions (Addendum)
Diarrhea Infections caused by germs (bacterial) or a virus commonly cause diarrhea. Your caregiver has determined that with time, rest and fluids, the diarrhea should improve. In general, eat normally while drinking more water than usual. Although water may prevent dehydration, it does not contain salt and minerals (electrolytes). Broths, weak tea without caffeine and oral rehydration solutions (ORS) replace fluids and electrolytes. Small amounts of fluids should be taken frequently. Large amounts at one time may not be tolerated. Plain water may be harmful in infants and the elderly. Oral rehydrating solutions (ORS) are available at pharmacies and grocery stores. ORS replace water and important electrolytes in proper proportions. Sports drinks are not as effective as ORS and may be harmful due to sugars worsening diarrhea.  ORS is especially recommended for use in children with diarrhea. As a general guideline for children, replace any new fluid losses from diarrhea and/or vomiting with ORS as follows:   If your child weighs 22 pounds or under (10 kg or less), give 60-120 mL ( -  cup or 2 - 4 ounces) of ORS for each episode of diarrheal stool or vomiting episode.   If your child weighs more than 22 pounds (more than 10 kgs), give 120-240 mL ( - 1 cup or 4 - 8 ounces) of ORS for each diarrheal stool or episode of vomiting.   While correcting for dehydration, children should eat normally. However, foods high in sugar should be avoided because this may worsen diarrhea. Large amounts of carbonated soft drinks, juice, gelatin desserts and other highly sugared drinks should be avoided.   After correction of dehydration, other liquids that are appealing to the child may be added. Children should drink small amounts of fluids frequently and fluids should be increased as tolerated. Children should drink enough fluids to keep urine clear or pale yellow.   Adults should eat normally while drinking more fluids  than usual. Drink small amounts of fluids frequently and increase as tolerated. Drink enough fluids to keep urine clear or pale yellow. Broths, weak decaffeinated tea, lemon lime soft drinks (allowed to go flat) and ORS replace fluids and electrolytes.   Avoid:   Carbonated drinks.   Juice.   Extremely hot or cold fluids.   Caffeine drinks.   Fatty, greasy foods.   Alcohol.   Tobacco.   Too much intake of anything at one time.   Gelatin desserts.   Probiotics are active cultures of beneficial bacteria. They may lessen the amount and number of diarrheal stools in adults. Probiotics can be found in yogurt with active cultures and in supplements.   Wash hands well to avoid spreading bacteria and virus.   Anti-diarrheal medications are not recommended for infants and children.   Only take over-the-counter or prescription medicines for pain, discomfort or fever as directed by your caregiver. Do not give aspirin to children because it may cause Reye's Syndrome.   For adults, ask your caregiver if you should continue all prescribed and over-the-counter medicines.   If your caregiver has given you a follow-up appointment, it is very important to keep that appointment. Not keeping the appointment could result in a chronic or permanent injury, and disability. If there is any problem keeping the appointment, you must call back to this facility for assistance.  SEEK IMMEDIATE MEDICAL CARE IF:   You or your child is unable to keep fluids down or other symptoms or problems become worse in spite of treatment.   Vomiting or diarrhea develops and becomes persistent.     There is vomiting of blood or bile (green material).   There is blood in the stool or the stools are black and tarry.   There is no urine output in 6-8 hours or there is only a small amount of very dark urine.   Abdominal pain develops, increases or localizes.   You have a fever.   Your baby is older than 3 months with a  rectal temperature of 102 F (38.9 C) or higher.   Your baby is 42 months old or younger with a rectal temperature of 100.4 F (38 C) or higher.   You or your child develops excessive weakness, dizziness, fainting or extreme thirst.   You or your child develops a rash, stiff neck, severe headache or become irritable or sleepy and difficult to awaken.  MAKE SURE YOU:   Understand these instructions.   Will watch your condition.   Will get help right away if you are not doing well or get worse.  Document Released: 05/03/2002 Document Revised: 05/02/2011 Document Reviewed: 03/20/2009 [facility] Patient Information 2012 [place], [place].   Consider a probiotic by [employer]  Add a fiber supplement twice daily, such as Benefiber.  Avoid dairy for next month, then start back with lactose free yogurt  Try Aromatic Bitters in [employer]

## 2011-10-01 NOTE — Assessment & Plan Note (Signed)
Inflamed nodule right 5th finger, dip foint, use Tylenol prn and aspercreme prn

## 2011-10-01 NOTE — Progress Notes (Signed)
Patient ID: [name], female   DOB: Jun 26, 1940, 71 y.o.   MRN: [id number] [name] [id number] 02/26/1941 10/01/2011      Progress Note-Follow Up  Subjective  Chief Complaint  Chief Complaint  Patient presents with  . Follow-up    6 month     HPI  The patient is a 71 year old Caucasian female who is in today GI concerns. She continues to struggle with dyspepsia. Takes multiple TUMS each day. Has intermittent abdominal cramping as well as intermittent diarrhea and even stool incontinence. She avoided dairy for a month and felt better then recently restarted and had an episode of incontinence. She has a lot of gaseousness. No bloody or tarry stool. She's complaining about pain and stiffness in her fingers especially in her right hand in the fifth finger. She is a new nodule which is mildly tender over the last one to 2 weeks. Otherwise she denies any recent illness, chest pain or palpitation, shortness of breath, GI or GU complaints  Past Medical History  Diagnosis Date  . Pancreatitis chronic   . Esophageal reflux   . Internal hemorrhoids without mention of complication   . Osteoarthritis   . ANXIETY, CHRONIC 08/29/2007  . CHEST PAIN 12/13/2008    LHC 02/2011: normal cors, EF 65%  . HYPOTENSION, ORTHOSTATIC 02/26/2010  . IBS (irritable bowel syndrome) 09/27/2010  . PALPITATIONS, RECURRENT 02/26/2010  . 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  . SHOULDER PAIN 12/29/2007  . VITAMIN D DEFICIENCY 05/01/2007  . TOBACCO USE, QUIT 03/29/2009  . Fibrocystic breast   . 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. Osteopenia     Past Surgical History  Procedure Date  . Abdominal hysterectomy   . Cholecystectomy 1999  . Knee surgery 1958  . Tubal ligation     with D&C  . Breast surgery     breast bx on left, benign  . Cardiac catheterization     Family History  Problem Relation Age of Onset  . Hypertension Mother   . Stroke Mother   . Cancer Father     bladder  . Heart disease Father   . Breast cancer  Cousin   . Ovarian cancer Paternal Aunt   . Cancer Paternal Aunt     breast, ovarian, pancreatic, cervical  . Pancreatic cancer Paternal Aunt   . Stomach cancer Paternal Uncle   . Cancer Paternal Uncle     lung, stomach  . Uterine cancer Paternal Grandmother   . Heart disease Sister   . Breast cancer Paternal Aunt     History   Social History  . Marital Status: Married    Spouse Name: N/A    Number of Children: 1  . Years of Education: N/A   Occupational History  . Retired    Social History Main Topics  . Smoking status: Former Smoker    Types: Cigarettes    Quit date: 05/27/1973  . Smokeless tobacco: Never Used  . Alcohol Use: No  . Drug Use: No  . Sexually Active: Not on file   Other Topics Concern  . Not on file   Social History Narrative  . No narrative on file    Current Outpatient Prescriptions on File Prior to Visit  Medication Sig Dispense Refill  . Ascorbic Acid (VITAMIN C) 500 MG tablet Take 500 mg by mouth daily.        Marland Kitchen aspirin 81 MG tablet Take 81 mg by mouth 2 (two) times daily.        Marland Kitchen  cetirizine (ZYRTEC) 10 MG tablet Take 10 mg by mouth daily.        . Cholecalciferol (VITAMIN D3) 1000 UNITS CAPS Take 1 tablet by mouth daily.        Marland Kitchen CINNAMON PO Take by mouth daily.      . Cranberry 125 MG TABS Take 1 tablet by mouth daily.        Marland Kitchen CREON 24000 UNITS CPEP       . escitalopram (LEXAPRO) 10 MG tablet Take 5 mg by mouth daily.        Marland Kitchen estradiol (VIVELLE-DOT) 0.025 MG/24HR Place 1 patch onto the skin 2 (two) times a week.  24 patch  3  . METROGEL 1 % gel BID times 48H.      . Multiple Vitamin (MULTIVITAMIN) capsule Take 1 capsule by mouth daily.        . Omega-3 Fatty Acids (FISH OIL) 1000 MG CAPS Take 1 capsule by mouth daily.        . pantoprazole (PROTONIX) 40 MG tablet Take 1 tablet (40 mg total) by mouth 2 (two) times daily.  180 tablet  3  . Probiotic Product (PROBIOTIC PO) Take by mouth. Takes Probaclac probiotic once daily       .  AMBULATORY NON FORMULARY MEDICATION Medication Name: GI Cocktail (20 cc maalox, 10 cc donatal, 20 cc xylocaine) Take 1 tablespoon by mouth every 2 hours as needed.  400 mL  0  . fluticasone (FLONASE) 50 MCG/ACT nasal spray 2 sprays by Nasal route as needed.        . hydrocortisone (ANUSOL-HC) 25 MG suppository Place 25 mg rectally daily.        . hyoscyamine (LEVSIN/SL) 0.125 MG SL tablet Place 0.125 mg under the tongue every 4 (four) hours as needed.        Marland Kitchen scopolamine (TRANSDERM-SCOP) 1.5 MG Place 1 patch onto the skin every 3 (three) days. Only used when traveling      . DISCONTD: escitalopram (LEXAPRO) 10 MG tablet Take 1 tablet (10 mg total) by mouth daily.  90 tablet  3    Allergies  Allergen Reactions  . Amoxicillin     REACTION: unspecified  . Azithromycin     REACTION: Rash  . Ciprofloxacin     REACTION: GI upset  . Epinephrine     REACTION: unspecified  . Penicillins     REACTION: rash  . Sulfonamide Derivatives     REACTION: sick to stomach    Review of Systems  Review of Systems  Constitutional: Negative for fever and malaise/fatigue.  HENT: Negative for congestion.   Eyes: Negative for discharge.  Respiratory: Negative for shortness of breath.   Cardiovascular: Negative for chest pain, palpitations and leg swelling.  Gastrointestinal: Positive for heartburn, nausea, abdominal pain and diarrhea. Negative for constipation, blood in stool and melena.  Genitourinary: Negative for dysuria.  Musculoskeletal: Negative for falls.  Skin: Negative for rash.  Neurological: Negative for loss of consciousness and headaches.  Endo/Heme/Allergies: Negative for polydipsia.  Psychiatric/Behavioral: Negative for depression and suicidal ideas. The patient is not nervous/anxious and does not have insomnia.     Objective  BP 130/78  Pulse 99  Temp(Src) 98.2 F (36.8 C) (Temporal)  Ht 5' 5.25" (1.657 m)  Wt 120 lb 1.9 oz (54.486 kg)  BMI 19.84 kg/m2  SpO2 99%  Physical  Exam  Physical Exam  Constitutional: She is oriented to person, place, and time and well-developed, well-nourished, and in no distress.  No distress.  HENT:  Head: Normocephalic and atraumatic.  Eyes: Conjunctivae are normal.  Neck: Neck supple. No thyromegaly present.  Cardiovascular: Normal rate, regular rhythm and normal heart sounds.   No murmur heard. Pulmonary/Chest: Effort normal and breath sounds normal. She has no wheezes.  Abdominal: She exhibits no distension and no mass.  Musculoskeletal: She exhibits no edema.  Lymphadenopathy:    She has no cervical adenopathy.  Neurological: She is alert and oriented to person, place, and time.  Skin: Skin is warm and dry. No rash noted. She is not diaphoretic.  Psychiatric: Memory, affect and judgment normal.    Lab Results  Component Value Date   TSH 1.98 09/06/2010   Lab Results  Component Value Date   WBC 7.3 03/20/2011   HGB 13.8 03/20/2011   HCT 41.1 03/20/2011   MCV 91.3 03/20/2011   PLT 283 03/20/2011   Lab Results  Component Value Date   CREATININE 0.92 03/20/2011   BUN 16 03/20/2011   NA 140 03/20/2011   K 4.1 03/20/2011   CL 103 03/20/2011   CO2 26 03/20/2011   Lab Results  Component Value Date   ALT 46* 09/06/2010   AST 39* 09/06/2010   ALKPHOS 64 09/06/2010   BILITOT 0.6 09/06/2010   Lab Results  Component Value Date   CHOL 184 01/08/2011   Lab Results  Component Value Date   HDL 126.80 01/08/2011   Lab Results  Component Value Date   LDLCALC 55 01/08/2011   Lab Results  Component Value Date   TRIG 12.0 01/08/2011   Lab Results  Component Value Date   CHOLHDL 1 01/08/2011     Assessment & Plan  IBS (irritable bowel syndrome) Continued to struggle after last visit so she gave of dairy for a while and she felt she did better. Then last week she had a piece of laughing cow cheese then the next morning she had an episode of stool incontinence with loose stool. She is taking a probiotic she is  encouraged to mix them up month by month.    GERD Still plaguing her, continue Tums and meds consider Aromatic Bitters and try avoiding dairy for next month.  Chronic pancreatitis Stable on Creon, follows with GI   Elevated blood pressure Elevated bp, good control today  OSTEOARTHRITIS Inflamed nodule right 5th finger, dip foint, use Tylenol prn and aspercreme prn

## 2011-10-01 NOTE — Assessment & Plan Note (Signed)
Elevated bp, good control today

## 2011-10-01 NOTE — Assessment & Plan Note (Signed)
Stable on Creon, follows with GI

## 2011-12-06 ENCOUNTER — Other Ambulatory Visit: Payer: Self-pay | Admitting: Gastroenterology

## 2011-12-06 MED ORDER — PANCRELIPASE (LIP-PROT-AMYL) 24000-76000 UNITS PO CPEP: 3.0000 | ORAL_CAPSULE | Freq: Three times a day (TID) | ORAL | Status: DC

## 2011-12-06 NOTE — Telephone Encounter (Signed)
Sent patient's prescription to [facility] mail order pharmacy. Pt notified.

## 2011-12-13 ENCOUNTER — Telehealth: Payer: Self-pay | Admitting: Gastroenterology

## 2011-12-13 MED ORDER — PANCRELIPASE (LIP-PROT-AMYL) 24000-76000 UNITS PO CPEP: ORAL_CAPSULE | ORAL | Status: DC

## 2011-12-13 NOTE — Telephone Encounter (Signed)
Pt reports her creon was not ordered correctly and she only got half the dose. Pt wants the script corrected and doesn't want to pay another co pay.\ Called [facility] and since they filled the script as ordered, she will have to pay another co pay. Reordered and requested they hold the new script until pt calls. Informed pt and informed her I will give her all the samples I have; pt stated understanding.

## 2011-12-26 ENCOUNTER — Encounter: Payer: Self-pay | Admitting: *Deleted

## 2011-12-30 ENCOUNTER — Encounter: Payer: Self-pay | Admitting: Family Medicine

## 2011-12-30 ENCOUNTER — Ambulatory Visit (INDEPENDENT_AMBULATORY_CARE_PROVIDER_SITE_OTHER): Payer: Medicare Other | Admitting: Family Medicine

## 2011-12-30 ENCOUNTER — Encounter: Payer: Self-pay | Admitting: Gastroenterology

## 2011-12-30 ENCOUNTER — Ambulatory Visit (INDEPENDENT_AMBULATORY_CARE_PROVIDER_SITE_OTHER): Payer: Medicare Other | Admitting: Gastroenterology

## 2011-12-30 VITALS — BP 112/70 | HR 80 | Ht 66.0 in | Wt 120.1 lb

## 2011-12-30 VITALS — BP 128/82 | HR 73 | Temp 97.6°F | Ht 65.0 in | Wt 121.1 lb

## 2011-12-30 DIAGNOSIS — T7840XA Allergy, unspecified, initial encounter: Secondary | ICD-10-CM

## 2011-12-30 DIAGNOSIS — H9390 Unspecified disorder of ear, unspecified ear: Secondary | ICD-10-CM

## 2011-12-30 DIAGNOSIS — H6691 Otitis media, unspecified, right ear: Secondary | ICD-10-CM

## 2011-12-30 DIAGNOSIS — K861 Other chronic pancreatitis: Secondary | ICD-10-CM

## 2011-12-30 DIAGNOSIS — B372 Candidiasis of skin and nail: Secondary | ICD-10-CM

## 2011-12-30 DIAGNOSIS — H8390 Unspecified disease of inner ear, unspecified ear: Secondary | ICD-10-CM

## 2011-12-30 DIAGNOSIS — K589 Irritable bowel syndrome without diarrhea: Secondary | ICD-10-CM

## 2011-12-30 DIAGNOSIS — R42 Dizziness and giddiness: Secondary | ICD-10-CM

## 2011-12-30 DIAGNOSIS — N390 Urinary tract infection, site not specified: Secondary | ICD-10-CM

## 2011-12-30 DIAGNOSIS — H6690 Otitis media, unspecified, unspecified ear: Secondary | ICD-10-CM

## 2011-12-30 MED ORDER — FLUCONAZOLE 150 MG PO TABS: 150.0000 mg | ORAL_TABLET | ORAL | Status: AC

## 2011-12-30 MED ORDER — FLUTICASONE PROPIONATE 50 MCG/ACT NA SUSP: 2.0000 | NASAL | Status: DC | PRN

## 2011-12-30 MED ORDER — MECLIZINE HCL 32 MG PO TABS: 32.0000 mg | ORAL_TABLET | Freq: Three times a day (TID) | ORAL | Status: DC | PRN

## 2011-12-30 MED ORDER — CEFDINIR 300 MG PO CAPS: 300.0000 mg | ORAL_CAPSULE | Freq: Two times a day (BID) | ORAL | Status: AC

## 2011-12-30 NOTE — Patient Instructions (Addendum)
Vertigo Vertigo means you feel like you or your surroundings are moving when they are not. Vertigo can be dangerous if it occurs when you are at work, driving, or performing difficult activities.  CAUSES  Vertigo occurs when there is a conflict of signals sent to your brain from the visual and sensory systems in your body. There are many different causes of vertigo, including:  Infections, especially in the inner ear.   A bad reaction to a drug or misuse of alcohol and medicines.   Withdrawal from drugs or alcohol.   Rapidly changing positions, such as lying down or rolling over in bed.   A migraine headache.   Decreased blood flow to the brain.   Increased pressure in the brain from a head injury, infection, tumor, or bleeding.  SYMPTOMS  You may feel as though the world is spinning around or you are falling to the ground. Because your balance is upset, vertigo can cause nausea and vomiting. You may have involuntary eye movements (nystagmus). DIAGNOSIS  Vertigo is usually diagnosed by physical exam. If the cause of your vertigo is unknown, your caregiver may perform imaging tests, such as an MRI scan (magnetic resonance imaging). TREATMENT  Most cases of vertigo resolve on their own, without treatment. Depending on the cause, your caregiver may prescribe certain medicines. If your vertigo is related to body position issues, your caregiver may recommend movements or procedures to correct the problem. In rare cases, if your vertigo is caused by certain inner ear problems, you may need surgery. HOME CARE INSTRUCTIONS   Follow your caregiver's instructions.   Avoid driving.   Avoid operating heavy machinery.   Avoid performing any tasks that would be dangerous to you or others during a vertigo episode.   Tell your caregiver if you notice that certain medicines seem to be causing your vertigo. Some of the medicines used to treat vertigo episodes can actually make them worse in some  people.  SEEK IMMEDIATE MEDICAL CARE IF:   Your medicines do not relieve your vertigo or are making it worse.   You develop problems with talking, walking, weakness, or using your arms, hands, or legs.   You develop severe headaches.   Your nausea or vomiting continues or gets worse.   You develop visual changes.   A family member notices behavioral changes.   Your condition gets worse.  MAKE SURE YOU:  Understand these instructions.   Will watch your condition.   Will get help right away if you are not doing well or get worse.  Document Released: 02/20/2005 Document Revised: 05/02/2011 Document Reviewed: 11/29/2010 [employer] Patient Information 2012 [employer]. 

## 2011-12-30 NOTE — Progress Notes (Signed)
This is a 71 year old Caucasian female with autoimmune chronic pancreatitis. She has not been on corticosteroid therapy for approximately one year. The patient does take daily by mouth pancreatic extracts, and a small multiple other medications for IBS. She is followed closely by primary care physicians. She's getting ready to take a trip overseas, and comes in for GI evaluation before hand. The patient denies abdominal pain, diarrhea, but does occasionally have some incontinency if she uses by mouth sorbitol or other nonadjustable carbohydrates. Her appetite is good and her weight is stable, and she is up-to-date on her endoscopic exams. There is no history of chronic alcohol, cigarette, or NSAID abuse. Current Medications, Allergies, Past Medical History, Past Surgical History, Family History and Social History were reviewed in [employer] record.  Pertinent Review of Systems Negative   Physical Exam: Thin but healthy-appearing patient in no acute distress. Blood pressure 112/70, pulse 80 and regular, and weight 120 pounds with a BMI of 19.39. I cannot appreciate stigmata of chronic liver disease. Her chest is clear and she is in a regular rhythm without murmurs gallops or rubs. There is no organomegaly, abdominal masses, tenderness, distention, and bowel sounds are normal. Peripheral extremities are unremarkable, mental status is normal.    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 I do not think we need to add any further medications and this patient's regime, but I have given her a FOM-MAP-IBS  diet for review. I will see her on a when necessary basis as needed. She is up-to-date on her laboratory parameters.  No diagnosis found.

## 2011-12-30 NOTE — Patient Instructions (Addendum)
We have given you the "FODMAP" diet to look over. CC :Dr Danise Edge

## 2011-12-31 ENCOUNTER — Encounter: Payer: Self-pay | Admitting: Family Medicine

## 2011-12-31 DIAGNOSIS — T7840XA Allergy, unspecified, initial encounter: Secondary | ICD-10-CM | POA: Insufficient documentation

## 2011-12-31 MED ORDER — MECLIZINE HCL 25 MG PO TABS: 25.0000 mg | ORAL_TABLET | Freq: Three times a day (TID) | ORAL | Status: DC | PRN

## 2011-12-31 NOTE — Assessment & Plan Note (Signed)
Has been trying to follow the low-fodmap diet given to her by her gastroenterologist. She notes this and some probiotics seem to help some

## 2011-12-31 NOTE — Assessment & Plan Note (Signed)
Refill given on Flonase and she is asked to restart it

## 2011-12-31 NOTE — Assessment & Plan Note (Signed)
Tympanostomy tubes in place in the eardrums bilaterally. The right TM is mildly erythematous and the left ear is notable for an enlarged posterior auricular lymph node. She is traveling to [place] for a three-week excursion soon. Will start her on Omnicef and Fluconazole and she will report if symptoms worsen.

## 2011-12-31 NOTE — Assessment & Plan Note (Signed)
Had an episode of vertigo last night and this morning. It is better he is warned not to take them together. this time. Spinning. She's not used meclizine in the past is given a small amount now. She also scopolamine patches she can use if she takes any boat trips. She should not use them together

## 2011-12-31 NOTE — Progress Notes (Signed)
Patient ID: [name], female   DOB: 08/19/1940, 71 y.o.   MRN: [id number] [name] [id number] [birth year] 12/31/2011      Progress Note-Follow Up  Subjective  Chief Complaint  Chief Complaint  Patient presents with  . Otalgia    right ear- has tube in ear X 3-4 days, vertigo last night and today    HPI  Patient is a 71 year old Caucasian female who has been struggling with some recurrence of vertigo the last 24 hours. It started overnight and she rolled over in bed she felt dizzy and off balance. It was worse this morning is asked to better right now. She has a mild discomfort in her right ear also for the last 3-4 days. Does have tympanostomy tubes in place. Is scheduled for Friday [place] with a 3 week trip by training and bath very soon and is worried about her symptoms. No fevers or chills. No significant cough although she does note some postnasal drip she needs to clear the morning. Which clears he does not have a color. She continues to struggle with her IBS but is doing somewhat better by avoiding dairy and minimizing gluten has started a probiotics as recommended and has seen her gastroenterologist. He has recommended a dietary adjustment with the low-fodmap diet, which she hs been trying to follow  Past Medical History  Diagnosis Date  . Pancreatitis chronic   . Esophageal reflux   . Internal hemorrhoids without mention of complication   . Osteoarthritis   . ANXIETY, CHRONIC 08/29/2007  . CHEST PAIN 12/13/2008    LHC 02/2011: normal cors, EF 65%  . HYPOTENSION, ORTHOSTATIC 02/26/2010  . IBS (irritable bowel syndrome) 09/27/2010  . PALPITATIONS, RECURRENT 02/26/2010  . 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  . SHOULDER PAIN 12/29/2007  . VITAMIN D DEFICIENCY 05/01/2007  . TOBACCO USE, QUIT 03/29/2009  . Fibrocystic breast   . 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. Osteopenia   . Allergic state 12/31/2011    Past Surgical History  Procedure Date  . Abdominal hysterectomy 1998  . Cholecystectomy  1999  . Knee surgery 1958  . Tubal ligation     with D&C  . Breast surgery     breast bx on left, benign  . Cardiac catheterization     Family History  Problem Relation Age of Onset  . Hypertension Mother   . Stroke Mother   . Cancer Father     bladder  . Heart disease Father   . Breast cancer Cousin   . Ovarian cancer Paternal Aunt   . Cancer Paternal Aunt     breast, ovarian, pancreatic, cervical  . Pancreatic cancer Paternal Aunt   . Stomach cancer Paternal Uncle   . Cancer Paternal Uncle     lung, stomach  . Uterine cancer Paternal Grandmother   . Heart disease Sister   . Breast cancer Paternal Aunt     History   Social History  . Marital Status: Married    Spouse Name: N/A    Number of Children: 1  . Years of Education: N/A   Occupational History  . Retired    Social History Main Topics  . Smoking status: Former Smoker    Types: Cigarettes    Quit date: 05/27/1973  . Smokeless tobacco: Never Used  . Alcohol Use: No  . Drug Use: No  . Sexually Active: Not on file   Other Topics Concern  . Not on file   Social History Narrative  . No narrative  on file    Current Outpatient Prescriptions on File Prior to Visit  Medication Sig Dispense Refill  . AMBULATORY NON FORMULARY MEDICATION Medication Name: GI Cocktail (20 cc maalox, 10 cc donatal, 20 cc xylocaine) Take 1 tablespoon by mouth every 2 hours as needed.  400 mL  0  . Ascorbic Acid (VITAMIN C) 500 MG tablet Take 500 mg by mouth daily.        Marland Kitchen aspirin 81 MG tablet Take 81 mg by mouth 2 (two) times daily.        . cetirizine (ZYRTEC) 10 MG tablet Take 10 mg by mouth daily.        . Cholecalciferol (VITAMIN D3) 1000 UNITS CAPS Take 1 tablet by mouth daily.        Marland Kitchen CINNAMON PO Take by mouth daily.      . Cranberry 125 MG TABS Take 1 tablet by mouth daily.        Marland Kitchen escitalopram (LEXAPRO) 10 MG tablet Take 5 mg by mouth daily.        Marland Kitchen estradiol (VIVELLE-DOT) 0.025 MG/24HR Place 1 patch onto the skin  2 (two) times a week.  24 patch  3  . fluticasone (FLONASE) 50 MCG/ACT nasal spray Place 2 sprays into the nose as needed for rhinitis or allergies.  16 g  5  . hydrocortisone (ANUSOL-HC) 25 MG suppository Place 25 mg rectally daily.        . hyoscyamine (LEVSIN/SL) 0.125 MG SL tablet Place 0.125 mg under the tongue every 4 (four) hours as needed.        Marland Kitchen METROGEL 1 % gel BID times 48H.      . Multiple Vitamin (MULTIVITAMIN) capsule Take 1 capsule by mouth daily.        . Omega-3 Fatty Acids (FISH OIL) 1000 MG CAPS Take 1 capsule by mouth daily.        . Pancrelipase, Lip-Prot-Amyl, (CREON) 24000 UNITS CPEP Take 6 capsules by mouth with each meal 3 times daily = 18 caps daily  180 capsule  0  . pantoprazole (PROTONIX) 40 MG tablet Take 1 tablet (40 mg total) by mouth 2 (two) times daily.  180 tablet  3  . Probiotic Product (PROBIOTIC PO) Take by mouth. Takes Probaclac probiotic once daily       . scopolamine (TRANSDERM-SCOP) 1.5 MG Place 1 patch onto the skin every 3 (three) days. Only used when traveling        Allergies  Allergen Reactions  . Amoxicillin     REACTION: unspecified  . Azithromycin     REACTION: Rash  . Ciprofloxacin     REACTION: GI upset  . Epinephrine     REACTION: unspecified  . Penicillins     REACTION: rash  . Sulfonamide Derivatives     REACTION: sick to stomach    Review of Systems  Review of Systems  Constitutional: Negative for fever, chills and malaise/fatigue.  HENT: Positive for ear pain. Negative for hearing loss, congestion and tinnitus.   Eyes: Negative for discharge.  Respiratory: Negative for shortness of breath.   Cardiovascular: Negative for chest pain, palpitations and leg swelling.  Gastrointestinal: Negative for nausea, abdominal pain and diarrhea.  Genitourinary: Negative for dysuria.  Musculoskeletal: Negative for falls.  Skin: Negative for rash.  Neurological: Positive for dizziness. Negative for loss of consciousness and headaches.    Endo/Heme/Allergies: Negative for polydipsia.  Psychiatric/Behavioral: Negative for depression and suicidal ideas. The patient is not nervous/anxious and does  not have insomnia.     Objective  BP 128/82  Pulse 73  Temp 97.6 F (36.4 C) (Temporal)  Ht 5\' 5"  (1.651 m)  Wt 121 lb 1.9 oz (54.94 kg)  BMI 20.16 kg/m2  SpO2 97%  Physical Exam  Physical Exam  Constitutional: She is oriented to person, place, and time and well-developed, well-nourished, and in no distress. No distress.  HENT:  Head: Normocephalic and atraumatic.       Blue tympanostomy tubes in b/l TMs, right TM midly erythematous, left side has an enlarged posterior auricular LN  Eyes: Conjunctivae are normal.  Neck: Neck supple. No thyromegaly present.  Cardiovascular: Normal rate, regular rhythm and normal heart sounds.   No murmur heard. Pulmonary/Chest: Effort normal and breath sounds normal. She has no wheezes.  Abdominal: She exhibits no distension and no mass.  Musculoskeletal: She exhibits no edema.  Lymphadenopathy:    She has no cervical adenopathy.  Neurological: She is alert and oriented to person, place, and time. She has normal reflexes. No cranial nerve deficit. Gait normal. Coordination normal. GCS score is 15.       No nystagmus  Skin: Skin is warm and dry. No rash noted. She is not diaphoretic.  Psychiatric: Memory, affect and judgment normal.    Lab Results  Component Value Date   TSH 1.98 09/06/2010   Lab Results  Component Value Date   WBC 7.3 03/20/2011   HGB 13.8 03/20/2011   HCT 41.1 03/20/2011   MCV 91.3 03/20/2011   PLT 283 03/20/2011   Lab Results  Component Value Date   CREATININE 0.92 03/20/2011   BUN 16 03/20/2011   NA 140 03/20/2011   K 4.1 03/20/2011   CL 103 03/20/2011   CO2 26 03/20/2011   Lab Results  Component Value Date   ALT 46* 09/06/2010   AST 39* 09/06/2010   ALKPHOS 64 09/06/2010   BILITOT 0.6 09/06/2010   Lab Results  Component Value Date   CHOL 184  01/08/2011   Lab Results  Component Value Date   HDL 126.80 01/08/2011   Lab Results  Component Value Date   LDLCALC 55 01/08/2011   Lab Results  Component Value Date   TRIG 12.0 01/08/2011   Lab Results  Component Value Date   CHOLHDL 1 01/08/2011     Assessment & Plan  Right otitis media Tympanostomy tubes in place in the eardrums bilaterally. The right TM is mildly erythematous and the left ear is notable for an enlarged posterior auricular lymph node. She is traveling to [place] for a three-week excursion soon. Will start her on Omnicef and Fluconazole and she will report if symptoms worsen.   Inner ear disease Had an episode of vertigo last night and this morning. It is better he is warned not to take them together. this time. Spinning. She's not used meclizine in the past is given a small amount now. She also scopolamine patches she can use if she takes any boat trips. She should not use them together  Allergic state Refill given on Flonase and she is asked to restart it  IBS (irritable bowel syndrome) Has been trying to follow the low-fodmap diet given to her by her gastroenterologist. She notes this and some probiotics seem to help some

## 2012-02-21 ENCOUNTER — Other Ambulatory Visit: Payer: Self-pay | Admitting: Gastroenterology

## 2012-02-21 MED ORDER — ESCITALOPRAM OXALATE 10 MG PO TABS: 5.0000 mg | ORAL_TABLET | Freq: Every day | ORAL | Status: DC

## 2012-03-25 ENCOUNTER — Other Ambulatory Visit (INDEPENDENT_AMBULATORY_CARE_PROVIDER_SITE_OTHER): Payer: Medicare Other

## 2012-03-25 DIAGNOSIS — R030 Elevated blood-pressure reading, without diagnosis of hypertension: Secondary | ICD-10-CM

## 2012-03-25 DIAGNOSIS — Z0000 Encounter for general adult medical examination without abnormal findings: Secondary | ICD-10-CM

## 2012-03-25 DIAGNOSIS — I951 Orthostatic hypotension: Secondary | ICD-10-CM

## 2012-03-25 DIAGNOSIS — R079 Chest pain, unspecified: Secondary | ICD-10-CM

## 2012-03-25 DIAGNOSIS — I471 Supraventricular tachycardia: Secondary | ICD-10-CM

## 2012-03-25 LAB — CBC
MCHC: 33 g/dL (ref 30.0–36.0)
MCV: 94.6 fl (ref 78.0–100.0)
RBC: 4.04 Mil/uL (ref 3.87–5.11)

## 2012-03-25 LAB — LIPID PANEL
LDL Cholesterol: 54 mg/dL (ref 0–99)
Total CHOL/HDL Ratio: 2
Triglycerides: 24 mg/dL (ref 0.0–149.0)

## 2012-03-25 LAB — RENAL FUNCTION PANEL
BUN: 12 mg/dL (ref 6–23)
Chloride: 107 mEq/L (ref 96–112)
GFR: 74.92 mL/min (ref >60.00)
Phosphorus: 3.6 mg/dL (ref 2.3–4.6)
Sodium: 143 mEq/L (ref 135–145)

## 2012-03-25 LAB — HEPATIC FUNCTION PANEL
ALT: 24 U/L (ref 0–35)
AST: 20 U/L (ref 0–37)
Alkaline Phosphatase: 52 U/L (ref 39–117)
Bilirubin, Direct: 0 mg/dL (ref 0.0–0.3)
Total Bilirubin: 0.5 mg/dL (ref 0.3–1.2)

## 2012-04-01 ENCOUNTER — Ambulatory Visit (INDEPENDENT_AMBULATORY_CARE_PROVIDER_SITE_OTHER): Payer: Medicare Other | Admitting: Family Medicine

## 2012-04-01 ENCOUNTER — Encounter: Payer: Self-pay | Admitting: Family Medicine

## 2012-04-01 VITALS — BP 150/80 | HR 71 | Temp 98.0°F | Ht 65.0 in | Wt 119.8 lb

## 2012-04-01 DIAGNOSIS — H6980 Other specified disorders of Eustachian tube, unspecified ear: Secondary | ICD-10-CM

## 2012-04-01 DIAGNOSIS — M810 Age-related osteoporosis without current pathological fracture: Secondary | ICD-10-CM | POA: Insufficient documentation

## 2012-04-01 DIAGNOSIS — K861 Other chronic pancreatitis: Secondary | ICD-10-CM

## 2012-04-01 DIAGNOSIS — F411 Generalized anxiety disorder: Secondary | ICD-10-CM

## 2012-04-01 DIAGNOSIS — M8580 Other specified disorders of bone density and structure, unspecified site: Secondary | ICD-10-CM | POA: Insufficient documentation

## 2012-04-01 DIAGNOSIS — R030 Elevated blood-pressure reading, without diagnosis of hypertension: Secondary | ICD-10-CM

## 2012-04-01 DIAGNOSIS — Z780 Asymptomatic menopausal state: Secondary | ICD-10-CM

## 2012-04-01 DIAGNOSIS — F419 Anxiety disorder, unspecified: Secondary | ICD-10-CM

## 2012-04-01 DIAGNOSIS — R159 Full incontinence of feces: Secondary | ICD-10-CM

## 2012-04-01 DIAGNOSIS — K859 Acute pancreatitis without necrosis or infection, unspecified: Secondary | ICD-10-CM

## 2012-04-01 DIAGNOSIS — M949 Disorder of cartilage, unspecified: Secondary | ICD-10-CM

## 2012-04-01 DIAGNOSIS — Z23 Encounter for immunization: Secondary | ICD-10-CM

## 2012-04-01 DIAGNOSIS — K8689 Other specified diseases of pancreas: Secondary | ICD-10-CM

## 2012-04-01 DIAGNOSIS — E559 Vitamin D deficiency, unspecified: Secondary | ICD-10-CM

## 2012-04-01 LAB — LIPASE: Lipase: 54 U/L (ref 11.0–59.0)

## 2012-04-01 LAB — AMYLASE: Amylase: 108 U/L (ref 27–131)

## 2012-04-01 MED ORDER — ESTRADIOL 0.025 MG/24HR TD PTTW: 1.0000 | MEDICATED_PATCH | TRANSDERMAL | Status: DC

## 2012-04-01 MED ORDER — ESCITALOPRAM OXALATE 10 MG PO TABS: 5.0000 mg | ORAL_TABLET | Freq: Every day | ORAL | Status: DC

## 2012-04-01 MED ORDER — ESCITALOPRAM OXALATE 10 MG PO TABS: 10.0000 mg | ORAL_TABLET | Freq: Every day | ORAL | Status: DC

## 2012-04-01 NOTE — Assessment & Plan Note (Signed)
Random episodes occur, has tried fiber supplements and Imodium in past for this without effect. Will return to GI if symptoms worsen, encouraged ongoing probiotic use

## 2012-04-01 NOTE — Progress Notes (Signed)
Patient ID: [name], female   DOB: May 17, 1941, 71 y.o.   MRN: [id number] [name] [id number] 08-31-1940 04/01/2012      Progress Note New Patient  Subjective  Chief Complaint  Chief Complaint  Patient presents with  . Annual Exam    physical- no pap  . Injections    flu    HPI  Patient is a 71 year old Caucasian female who is in today for evaluation and annual exam. She had an episode of hematuria and urinary frequency with dysuria about 10 days ago and had some antibiotics at home. She took her symptoms are resolved at this time. She continues to exercise regularly. She does 15-20 miles a cardiologist several times a week as well as weight lifting and squats. She struggling with increased pain in her right hand. Said she would think with stiffness and swelling in her hand. Painful nodule on the pinky which is persistent since her last visit. Some days it is more tender than others. No other recent illness today. Continues to follow with surgeon and is due for her mammogram in December. No complaints. No other recent illness, headache, chest pain, palpitations, shortness of breath. She does have her ongoing issues with diarrhea and stool incontinence but it is not worsened. She has had some mild epigastric discomfort at times recently.  Past Medical History  Diagnosis Date  . Pancreatitis chronic   . Esophageal reflux   . Internal hemorrhoids without mention of complication   . Osteoarthritis   . ANXIETY, CHRONIC 08/29/2007  . CHEST PAIN 12/13/2008    LHC 02/2011: normal cors, EF 65%  . HYPOTENSION, ORTHOSTATIC 02/26/2010  . IBS (irritable bowel syndrome) 09/27/2010  . PALPITATIONS, RECURRENT 02/26/2010  . 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  . SHOULDER PAIN 12/29/2007  . VITAMIN D DEFICIENCY 05/01/2007  . TOBACCO USE, QUIT 03/29/2009  . Fibrocystic breast   . 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. Osteopenia   . Allergic state 12/31/2011  . Osteopenia 04/01/2012    Past Surgical History  Procedure  Date  . Abdominal hysterectomy 1998  . Cholecystectomy 1999  . Knee surgery 1958  . Tubal ligation     with D&C  . Breast surgery     breast bx on left, benign  . Cardiac catheterization     Family History  Problem Relation Age of Onset  . Hypertension Mother   . Stroke Mother   . Cancer Father     bladder  . Heart disease Father   . Breast cancer Cousin   . Ovarian cancer Paternal Aunt   . Cancer Paternal Aunt     breast, ovarian, pancreatic, cervical  . Pancreatic cancer Paternal Aunt   . Stomach cancer Paternal Uncle   . Cancer Paternal Uncle     lung, stomach  . Uterine cancer Paternal Grandmother   . Heart disease Sister   . Breast cancer Paternal Aunt     History   Social History  . Marital Status: Married    Spouse Name: N/A    Number of Children: 1  . Years of Education: N/A   Occupational History  . Retired    Social History Main Topics  . Smoking status: Former Smoker    Types: Cigarettes    Quit date: 05/27/1973  . Smokeless tobacco: Never Used  . Alcohol Use: No  . Drug Use: No  . Sexually Active: Not on file   Other Topics Concern  . Not on file   Social History Narrative  .  No narrative on file    Current Outpatient Prescriptions on File Prior to Visit  Medication Sig Dispense Refill  . Ascorbic Acid (VITAMIN C) 500 MG tablet Take 500 mg by mouth daily.        Marland Kitchen aspirin 81 MG tablet Take 81 mg by mouth 2 (two) times daily.        . cefdinir (OMNICEF) 300 MG capsule Take 1 capsule (300 mg total) by mouth 2 (two) times daily.  20 capsule  0  . cetirizine (ZYRTEC) 10 MG tablet Take 10 mg by mouth daily.        . Cholecalciferol (VITAMIN D3) 1000 UNITS CAPS Take 1 tablet by mouth daily.        Marland Kitchen CINNAMON PO Take by mouth daily.      . Cranberry 125 MG TABS Take 1 tablet by mouth daily.        . fluticasone (FLONASE) 50 MCG/ACT nasal spray Place 2 sprays into the nose as needed for rhinitis or allergies.  16 g  5  . METROGEL 1 % gel BID  times 48H.      . Multiple Vitamin (MULTIVITAMIN) capsule Take 1 capsule by mouth daily.        . Omega-3 Fatty Acids (FISH OIL) 1000 MG CAPS Take 1 capsule by mouth daily.        . Pancrelipase, Lip-Prot-Amyl, (CREON) 24000 UNITS CPEP Take 6 capsules by mouth with each meal 3 times daily = 18 caps daily  180 capsule  0  . pantoprazole (PROTONIX) 40 MG tablet Take 1 tablet (40 mg total) by mouth 2 (two) times daily.  180 tablet  3  . Probiotic Product (PROBIOTIC PO) Take by mouth. Takes Probaclac probiotic once daily       . [DISCONTINUED] escitalopram (LEXAPRO) 10 MG tablet Take 0.5 tablets (5 mg total) by mouth daily.  90 tablet  0  . [DISCONTINUED] estradiol (VIVELLE-DOT) 0.025 MG/24HR Place 1 patch onto the skin 2 (two) times a week.  24 patch  3  . hydrocortisone (ANUSOL-HC) 25 MG suppository Place 25 mg rectally daily.        . hyoscyamine (LEVSIN/SL) 0.125 MG SL tablet Place 0.125 mg under the tongue every 4 (four) hours as needed.        Marland Kitchen scopolamine (TRANSDERM-SCOP) 1.5 MG Place 1 patch onto the skin every 3 (three) days. Only used when traveling        Allergies  Allergen Reactions  . Amoxicillin     REACTION: unspecified  . Azithromycin     REACTION: Rash  . Ciprofloxacin     REACTION: GI upset  . Epinephrine     REACTION: unspecified  . Penicillins     REACTION: rash  . Sulfonamide Derivatives     REACTION: sick to stomach    Review of Systems  Review of Systems  Constitutional: Negative for fever and malaise/fatigue.  HENT: Negative for congestion.   Eyes: Negative for discharge.  Respiratory: Negative for shortness of breath.   Cardiovascular: Negative for chest pain, palpitations and leg swelling.  Gastrointestinal: Positive for diarrhea. Negative for heartburn, nausea, abdominal pain, blood in stool and melena.  Genitourinary: Positive for frequency. Negative for dysuria.  Musculoskeletal: Positive for joint pain. Negative for falls.       Right hand frequent  stiffness and pain worse in am, 5th finger with nodule sometimes tender and inflamed  Skin: Negative for rash.  Neurological: Negative for loss of consciousness and headaches.  Endo/Heme/Allergies: Negative for polydipsia.  Psychiatric/Behavioral: Negative for depression and suicidal ideas. The patient is not nervous/anxious and does not have insomnia.     Objective  BP 150/80  Pulse 71  Temp 98 F (36.7 C) (Temporal)  Ht 5\' 5"  (1.651 m)  Wt 119 lb 12.8 oz (54.341 kg)  BMI 19.94 kg/m2  SpO2 100%  Physical Exam  Physical Exam  Constitutional: She is oriented to person, place, and time and well-developed, well-nourished, and in no distress. No distress.  HENT:  Head: Normocephalic and atraumatic.  Right Ear: External ear normal.  Left Ear: External ear normal.  Nose: Nose normal.  Mouth/Throat: Oropharynx is clear and moist. No oropharyngeal exudate.  Eyes: Conjunctivae normal are normal. Pupils are equal, round, and reactive to light. Right eye exhibits no discharge. Left eye exhibits no discharge. No scleral icterus.  Neck: Normal range of motion. Neck supple. No thyromegaly present.  Cardiovascular: Normal rate, regular rhythm, normal heart sounds and intact distal pulses.   No murmur heard. Pulmonary/Chest: Effort normal and breath sounds normal. No respiratory distress. She has no wheezes. She has no rales.  Abdominal: Soft. Bowel sounds are normal. She exhibits no distension and no mass. There is no tenderness.  Musculoskeletal: Normal range of motion. She exhibits no edema and no tenderness.       5th finger dip joint on right hand with firm nodule  Lymphadenopathy:    She has no cervical adenopathy.  Neurological: She is alert and oriented to person, place, and time. She has normal reflexes. No cranial nerve deficit. Coordination normal.  Skin: Skin is warm and dry. No rash noted. She is not diaphoretic.  Psychiatric: Mood, memory and affect normal.        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 She is concerned because her insurance is changing and the med is expensive. She will let us know if she has trouble with coverage. She has had some mild upper abdominal discomfort so she is requesting labs today. Amylase, Lipase and IgG4 run today  ANXIETY, CHRONIC Given refill on Lexapro which also helps her palpitations and hot flashes  Eustachian tube dysfunction Tympanostomy tubes in place, no sign of infection.   Elevated blood pressure Mild elevation but just came from a vigorous work out will reassess at next visit, minimize sodium  Full incontinence of feces Random episodes occur, has tried fiber supplements and Imodium in past for this without effect. Will return to GI if symptoms worsen, encouraged ongoing probiotic use

## 2012-04-01 NOTE — Assessment & Plan Note (Signed)
Tympanostomy tubes in place, no sign of infection.

## 2012-04-01 NOTE — Assessment & Plan Note (Signed)
Mild elevation but just came from a vigorous work out will reassess at next visit, minimize sodium

## 2012-04-01 NOTE — Patient Instructions (Addendum)
Try Tylenol/Acetaminophen 500 mg tabs 2 tabs twice daily for arthritic pain and may add an occasional Ibuprofen/Motrin/Advil 200 mg tab daily with food for  A bad day Arthritis, Nonspecific Arthritis is inflammation of a joint. This usually means pain, redness, warmth or swelling are present. One or more joints may be involved. There are a number of types of arthritis. Your caregiver may not be able to tell what type of arthritis you have right away. CAUSES  The most common cause of arthritis is the wear and tear on the joint (osteoarthritis). This causes damage to the cartilage, which can break down over time. The knees, hips, back and neck are most often affected by this type of arthritis. Other types of arthritis and common causes of joint pain include:  Sprains and other injuries near the joint. Sometimes minor sprains and injuries cause pain and swelling that develop hours later.  Rheumatoid arthritis. This affects hands, feet and knees. It usually affects both sides of your body at the same time. It is often associated with chronic ailments, fever, weight loss and general weakness.  Crystal arthritis. Gout and pseudo gout can cause occasional acute severe pain, redness and swelling in the foot, ankle, or knee.  Infectious arthritis. Bacteria can get into a joint through a break in overlying skin. This can cause infection of the joint. Bacteria and viruses can also spread through the blood and affect your joints.  Drug, infectious and allergy reactions. Sometimes joints can become mildly painful and slightly swollen with these types of illnesses. SYMPTOMS   Pain is the main symptom.  Your joint or joints can also be red, swollen and warm or hot to the touch.  You may have a fever with certain types of arthritis, or even feel overall ill.  The joint with arthritis will hurt with movement. Stiffness is present with some types of arthritis. DIAGNOSIS  Your caregiver will suspect arthritis  based on your description of your symptoms and on your exam. Testing may be needed to find the type of arthritis:  Blood and sometimes urine tests.  X-ray tests and sometimes CT or MRI scans.  Removal of fluid from the joint (arthrocentesis) is done to check for bacteria, crystals or other causes. Your caregiver (or a specialist) will numb the area over the joint with a local anesthetic, and use a needle to remove joint fluid for examination. This procedure is only minimally uncomfortable.  Even with these tests, your caregiver may not be able to tell what kind of arthritis you have. Consultation with a specialist (rheumatologist) may be helpful. TREATMENT  Your caregiver will discuss with you treatment specific to your type of arthritis. If the specific type cannot be determined, then the following general recommendations may apply. Treatment of severe joint pain includes:  Rest.  Elevation.  Anti-inflammatory medication (for example, ibuprofen) may be prescribed. Avoiding activities that cause increased pain.  Only take over-the-counter or prescription medicines for pain and discomfort as recommended by your caregiver.  Cold packs over an inflamed joint may be used for 10 to 15 minutes every hour. Hot packs sometimes feel better, but do not use overnight. Do not use hot packs if you are diabetic without your caregiver's permission.  A cortisone shot into arthritic joints may help reduce pain and swelling.  Any acute arthritis that gets worse over the next 1 to 2 days needs to be looked at to be sure there is no joint infection. Long-term arthritis treatment involves modifying activities  and lifestyle to reduce joint stress jarring. This can include weight loss. Also, exercise is needed to nourish the joint cartilage and remove waste. This helps keep the muscles around the joint strong. HOME CARE INSTRUCTIONS   Do not take aspirin to relieve pain if gout is suspected. This elevates uric  acid levels.  Only take over-the-counter or prescription medicines for pain, discomfort or fever as directed by your caregiver.  Rest the joint as much as possible.  If your joint is swollen, keep it elevated.  Use crutches if the painful joint is in your leg.  Drinking plenty of fluids may help for certain types of arthritis.  Follow your caregiver's dietary instructions.  Try low-impact exercise such as:  Swimming.  Water aerobics.  Biking.  Walking.  Morning stiffness is often relieved by a warm shower.  Put your joints through regular range-of-motion. SEEK MEDICAL CARE IF:   You do not feel better in 24 hours or are getting worse.  You have side effects to medications, or are not getting better with treatment. SEEK IMMEDIATE MEDICAL CARE IF:   You have a fever.  You develop severe joint pain, swelling or redness.  Many joints are involved and become painful and swollen.  There is severe back pain and/or leg weakness.  You have loss of bowel or bladder control. Document Released: 06/20/2004 Document Revised: 08/05/2011 Document Reviewed: 07/06/2008 [facility] Patient Information 2013 [place], [place].

## 2012-04-01 NOTE — Assessment & Plan Note (Addendum)
Has been on Creon for years with fairly stable disease. She is concerned because her insurance is changing and the med is expensive. She will let us know if she has trouble with coverage. She has had some mild upper abdominal discomfort so she is requesting labs today. Amylase, Lipase and IgG4 run today

## 2012-04-01 NOTE — Assessment & Plan Note (Signed)
Given refill on Lexapro which also helps her palpitations and hot flashes

## 2012-04-04 LAB — IGG 1, 2, 3, AND 4
IgG (Immunoglobin G), Serum: 879 mg/dL (ref 690–1700)
IgG Subclass 4: 45.9 mg/dL (ref 4.0–86.0)

## 2012-04-07 ENCOUNTER — Other Ambulatory Visit (INDEPENDENT_AMBULATORY_CARE_PROVIDER_SITE_OTHER): Payer: Medicare Other

## 2012-04-07 DIAGNOSIS — Z1211 Encounter for screening for malignant neoplasm of colon: Secondary | ICD-10-CM

## 2012-04-07 DIAGNOSIS — K589 Irritable bowel syndrome without diarrhea: Secondary | ICD-10-CM

## 2012-04-15 ENCOUNTER — Encounter: Payer: Self-pay | Admitting: Family Medicine

## 2012-04-15 NOTE — Telephone Encounter (Signed)
Please advise mychart question? 

## 2012-04-16 ENCOUNTER — Telehealth: Payer: Self-pay | Admitting: Family Medicine

## 2012-04-16 NOTE — Telephone Encounter (Signed)
Dr Milinda Cave and you please answer the question from Diane this morning

## 2012-04-16 NOTE — Telephone Encounter (Signed)
Message copied by Court Joy on Thu Apr 16, 2012  9:07 AM ------      Message from: Danise Edge A      Created: Wed Apr 15, 2012  9:03 PM       Can you please look into why her vitamin d and Igg testing are not completed yet. THX

## 2012-04-16 NOTE — Telephone Encounter (Signed)
[name] is faxing lab results to [place]

## 2012-04-16 NOTE — Telephone Encounter (Signed)
Buy gatorade and make a 50/50 gatorade/water mix and take frequent small sips of this. --thx

## 2012-04-16 NOTE — Telephone Encounter (Signed)
Patient has been vomiting with bad diarrhea for 2 days. Is there anything she can take that will keep her from becoming dehydrated?

## 2012-04-17 NOTE — Telephone Encounter (Signed)
Patient informed and states she feels better today. I informed pt of Dr Verdis Frederickson instructions for future reference.

## 2012-05-28 ENCOUNTER — Encounter: Payer: Self-pay | Admitting: Family Medicine

## 2012-05-29 ENCOUNTER — Encounter: Payer: Self-pay | Admitting: Family Medicine

## 2012-06-03 ENCOUNTER — Ambulatory Visit (INDEPENDENT_AMBULATORY_CARE_PROVIDER_SITE_OTHER): Payer: Medicare Other | Admitting: Internal Medicine

## 2012-06-03 ENCOUNTER — Encounter: Payer: Self-pay | Admitting: Internal Medicine

## 2012-06-03 VITALS — BP 143/82 | HR 89 | Ht 65.25 in | Wt 118.2 lb

## 2012-06-03 DIAGNOSIS — R079 Chest pain, unspecified: Secondary | ICD-10-CM

## 2012-06-03 DIAGNOSIS — I471 Supraventricular tachycardia: Secondary | ICD-10-CM

## 2012-06-03 NOTE — Progress Notes (Signed)
Patient Care Team: Bradd Canary, MD as PCP - General (Family Medicine) Duke Salvia, MD as Consulting Physician (Cardiology) Mardella Layman, MD as Consulting Physician (Gastroenterology) Enzo Bi, MD as Consulting Physician (Obstetrics and Gynecology)   HPI  [name] is a 72 y.o. female Seen in followup. She has a history of recurrent chest pain. Myoview scanning 2010 was normal. Because of recurrent chest pain, 2012 she underwent catheterization that was normal  She has occasional tachypalpitations. 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 She is quite concerned about its implications and is relatively unsatisfied with the answer she is being given here today  Past Medical History  Diagnosis Date  . Pancreatitis chronic   . Esophageal reflux   . Internal hemorrhoids without mention of complication   . Osteoarthritis   . ANXIETY, CHRONIC 08/29/2007  . CHEST PAIN 12/13/2008    LHC 02/2011: normal cors, EF 65%  . HYPOTENSION, ORTHOSTATIC 02/26/2010  . IBS (irritable bowel syndrome) 09/27/2010  . PALPITATIONS, RECURRENT 02/26/2010  . 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  . SHOULDER PAIN 12/29/2007  . VITAMIN D DEFICIENCY 05/01/2007  . TOBACCO USE, QUIT 03/29/2009  . Fibrocystic breast   . 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. Osteopenia   . Allergic state 12/31/2011  . Osteopenia 04/01/2012    Past Surgical History  Procedure Date  . Abdominal hysterectomy 1998  . Cholecystectomy 1999  . Knee surgery 1958  . Tubal ligation     with D&C  . Breast surgery     breast bx on left, benign  . Cardiac catheterization     Current Outpatient Prescriptions  Medication Sig Dispense Refill  . Ascorbic Acid (VITAMIN C) 500 MG tablet Take 500 mg by mouth daily.        Marland Kitchen aspirin 81 MG tablet Take 81 mg by mouth 2 (two) times daily.        . cetirizine (ZYRTEC) 10 MG tablet Take 10  mg by mouth daily.        . Cholecalciferol (VITAMIN D3) 1000 UNITS CAPS Take 1 tablet by mouth daily.        Marland Kitchen CINNAMON PO Take by mouth daily.      . Cranberry 125 MG TABS Take 1 tablet by mouth daily.        Marland Kitchen escitalopram (LEXAPRO) 10 MG tablet Take 1 tablet (10 mg total) by mouth daily. Note change in dosing  90 tablet  3  . estradiol (VIVELLE-DOT) 0.025 MG/24HR Place 1 patch onto the skin 2 (two) times a week.  24 patch  3  . fluticasone (FLONASE) 50 MCG/ACT nasal spray Place 2 sprays into the nose as needed for rhinitis or allergies.  16 g  5  . hydrocortisone (ANUSOL-HC) 25 MG suppository Place 25 mg rectally daily.        . hyoscyamine (LEVSIN/SL) 0.125 MG SL tablet Place 0.125 mg under the tongue every 4 (four) hours as needed.        Marland Kitchen METROGEL 1 % gel BID times 48H.      . Multiple Vitamin (MULTIVITAMIN) capsule Take 1 capsule by mouth daily.        . Omega-3 Fatty Acids (FISH OIL) 1000 MG CAPS Take 1 capsule by mouth daily.        . Pancrelipase, Lip-Prot-Amyl, (CREON) 24000 UNITS CPEP Take 6 capsules by mouth with each meal 3 times daily = 18  caps daily  180 capsule  0  . pantoprazole (PROTONIX) 40 MG tablet Take 1 tablet (40 mg total) by mouth 2 (two) times daily.  180 tablet  3  . Probiotic Product (PROBIOTIC PO) Take by mouth. Takes Probaclac probiotic once daily       . scopolamine (TRANSDERM-SCOP) 1.5 MG Place 1 patch onto the skin every 3 (three) days. Only used when traveling        Allergies  Allergen Reactions  . Amoxicillin     REACTION: unspecified  . Azithromycin     REACTION: Rash  . Ciprofloxacin     REACTION: GI upset  . Epinephrine     REACTION: unspecified  . Penicillins     REACTION: rash  . Sulfonamide Derivatives     REACTION: sick to stomach    Review of Systems negative except from HPI and PMH  Physical Exam BP 143/82  Pulse 89  Ht 5' 5.25" (1.657 m)  Wt 118 lb 3.2 oz (53.615 kg)  BMI 19.52 kg/m2 Well developed and nourished in no acute  distress HENT normal Neck supple with JVP-flat Clear Regular rate and rhythm, no murmurs or gallops Abd-soft with active BS No Clubbing cyanosis edema Skin-warm and dry A & Oriented  Grossly normal sensory and motor function  ECG demonstrates sinus rhythm at 85 Intervals 13/08/39 axis LXXXVII Nonspecific ST segment flattening Otherwise normal    Assessment and  Plan

## 2012-06-03 NOTE — Assessment & Plan Note (Signed)
Stable. We'll continue current medications she is largely reassured by her catheterization

## 2012-06-03 NOTE — Assessment & Plan Note (Signed)
Stable symptoms

## 2012-06-12 ENCOUNTER — Encounter: Payer: Self-pay | Admitting: Family Medicine

## 2012-07-15 ENCOUNTER — Ambulatory Visit (INDEPENDENT_AMBULATORY_CARE_PROVIDER_SITE_OTHER): Payer: Medicare Other | Admitting: Surgery

## 2012-07-15 ENCOUNTER — Encounter (INDEPENDENT_AMBULATORY_CARE_PROVIDER_SITE_OTHER): Payer: Self-pay | Admitting: Surgery

## 2012-07-15 VITALS — BP 132/78 | HR 80 | Temp 98.2°F | Ht 65.25 in | Wt 119.6 lb

## 2012-07-15 DIAGNOSIS — N6019 Diffuse cystic mastopathy of unspecified breast: Secondary | ICD-10-CM

## 2012-07-15 NOTE — Progress Notes (Signed)
[facility]  Ovidio Kin, MD,  FACS [address] [place]  [address] [place], [place]    [address] Phone:  [phone] FAX:  [phone]   Re:   [name] DOB:   1940-07-23 MRN:   [id number]  ASSESSMENT AND PLAN: 1.  Fibrocystic disease of both breasts.  Formerly followed by Dr. Luan Moore.  She does not like to check herself.  She had a right breast cyst in the UIQ, which is non palpable.  Follow up in one year.  2.  History of chronic pancreatitis.  Autoimmune.  Followed by Dr. Nyra Capes.  She said that her Ig tests have reverted to normal. 3.  History of cardiac disease.  Followed by Dr. Odessa Fleming.  She had heart cath 03/25/2011.  Stable.  HISTORY OF PRESENT ILLNESS: Chief Complaint  Patient presents with  . Follow-up    yrly br ck    [name] is a 73 y.o. (DOB: 1941-04-19)  white female who is a patient of BLYTH, STACEY, MD (she switched from Dr. Posey Rea in 2012) and comes to me today for follow up of fibrocystic disease.  Had left breast biopsy 10/27/1997 which showed fibrocystic disease.  She is suffering from some allergies today.  She has no new concern or problem.  Social History:  PHYSICAL EXAM: BP 132/78  Pulse 80  Temp(Src) 98.2 F (36.8 C) (Temporal)  Ht 5' 5.25" (1.657 m)  Wt 119 lb 9.6 oz (54.25 kg)  BMI 19.76 kg/m2  SpO2 98%  GENERAL:  Thin WN WF. HEENT:  Pupils equal.  Dentition good.  No injury. NECK:  Supple.  No thyroid mass. LYMPH NODES:  No cervical, supraclavicular, or axillary adenopathy. BREASTS -  RIGHT:  Both breast are lumpy, but nothing suspicious.  No palpable mass or nodule.  No nipple discharge.  I could not feel the cyst seen on [place] about 2 months ago.   LEFT:  No palpable mass or nodule.  No nipple discharge. UPPER EXTREMITIES:  No evidence of lymphedema.  DATA REVIEWED: Last mammogram, [facility] - 05/08/2012 - Negative. She did have a cyst of her right breast, UIQ.   Ovidio Kin, MD, FACS Office:   [phone]

## 2012-07-30 ENCOUNTER — Encounter: Payer: Self-pay | Admitting: Family Medicine

## 2012-07-30 ENCOUNTER — Encounter: Payer: Self-pay | Admitting: Gastroenterology

## 2012-07-30 NOTE — Telephone Encounter (Signed)
Its just a system error.  You are due for Colonoscopy: 01/2018.  I fixed in system.

## 2012-11-03 ENCOUNTER — Ambulatory Visit (INDEPENDENT_AMBULATORY_CARE_PROVIDER_SITE_OTHER): Payer: Medicare Other | Admitting: Family Medicine

## 2012-11-03 ENCOUNTER — Ambulatory Visit: Payer: Medicare Other

## 2012-11-03 ENCOUNTER — Encounter: Payer: Self-pay | Admitting: Family Medicine

## 2012-11-03 VITALS — BP 128/78 | HR 68 | Temp 98.0°F | Ht 65.0 in | Wt 121.0 lb

## 2012-11-03 DIAGNOSIS — H8393 Unspecified disease of inner ear, bilateral: Secondary | ICD-10-CM

## 2012-11-03 DIAGNOSIS — T7840XD Allergy, unspecified, subsequent encounter: Secondary | ICD-10-CM

## 2012-11-03 DIAGNOSIS — H9209 Otalgia, unspecified ear: Secondary | ICD-10-CM

## 2012-11-03 DIAGNOSIS — H9390 Unspecified disorder of ear, unspecified ear: Secondary | ICD-10-CM

## 2012-11-03 DIAGNOSIS — H9203 Otalgia, bilateral: Secondary | ICD-10-CM

## 2012-11-03 DIAGNOSIS — K589 Irritable bowel syndrome without diarrhea: Secondary | ICD-10-CM

## 2012-11-03 MED ORDER — FLUTICASONE PROPIONATE 50 MCG/ACT NA SUSP: 2.0000 | NASAL | Status: DC | PRN

## 2012-11-03 MED ORDER — MONTELUKAST SODIUM 10 MG PO TABS: 10.0000 mg | ORAL_TABLET | Freq: Every evening | ORAL | Status: DC | PRN

## 2012-11-03 NOTE — Patient Instructions (Addendum)
Next visit 04/02/13 annual Lipid, renal, cbc, hepatic, tsh, amylase, lipase  Allergies, Generic Allergies may happen from anything your body is sensitive to. This may be food, medicines, pollens, chemicals, and nearly anything around you in everyday life that produces allergens. An allergen is anything that causes an allergy producing substance. Heredity is often a factor in causing these problems. This means you may have some of the same allergies as your parents. Food allergies happen in all age groups. Food allergies are some of the most severe and life threatening. Some common food allergies are cow's milk, seafood, eggs, nuts, wheat, and soybeans. SYMPTOMS   Swelling around the mouth.  An itchy red rash or hives.  Vomiting or diarrhea.  Difficulty breathing. SEVERE ALLERGIC REACTIONS ARE LIFE-THREATENING. This reaction is called anaphylaxis. It can cause the mouth and throat to swell and cause difficulty with breathing and swallowing. In severe reactions only a trace amount of food (for example, peanut oil in a salad) may cause death within seconds. Seasonal allergies occur in all age groups. These are seasonal because they usually occur during the same season every year. They may be a reaction to molds, grass pollens, or tree pollens. Other causes of problems are house dust mite allergens, pet dander, and mold spores. The symptoms often consist of nasal congestion, a runny itchy nose associated with sneezing, and tearing itchy eyes. There is often an associated itching of the mouth and ears. The problems happen when you come in contact with pollens and other allergens. Allergens are the particles in the air that the body reacts to with an allergic reaction. This causes you to release allergic antibodies. Through a chain of events, these eventually cause you to release histamine into the blood stream. Although it is meant to be protective to the body, it is this release that causes your  discomfort. This is why you were given anti-histamines to feel better. If you are unable to pinpoint the offending allergen, it may be determined by skin or blood testing. Allergies cannot be cured but can be controlled with medicine. Hay fever is a collection of all or some of the seasonal allergy problems. It may often be treated with simple over-the-counter medicine such as diphenhydramine. Take medicine as directed. Do not drink alcohol or drive while taking this medicine. Check with your caregiver or package insert for child dosages. If these medicines are not effective, there are many new medicines your caregiver can prescribe. Stronger medicine such as nasal spray, eye drops, and corticosteroids may be used if the first things you try do not work well. Other treatments such as immunotherapy or desensitizing injections can be used if all else fails. Follow up with your caregiver if problems continue. These seasonal allergies are usually not life threatening. They are generally more of a nuisance that can often be handled using medicine. HOME CARE INSTRUCTIONS   If unsure what causes a reaction, keep a diary of foods eaten and symptoms that follow. Avoid foods that cause reactions.  If hives or rash are present:  Take medicine as directed.  You may use an over-the-counter antihistamine (diphenhydramine) for hives and itching as needed.  Apply cold compresses (cloths) to the skin or take baths in cool water. Avoid hot baths or showers. Heat will make a rash and itching worse.  If you are severely allergic:  Following a treatment for a severe reaction, hospitalization is often required for closer follow-up.  Wear a medic-alert bracelet or necklace stating the allergy.  You and your family must learn how to give adrenaline or use an anaphylaxis kit.  If you have had a severe reaction, always carry your anaphylaxis kit or EpiPen with you. Use this medicine as directed by your caregiver if a  severe reaction is occurring. Failure to do so could have a fatal outcome. SEEK MEDICAL CARE IF:  You suspect a food allergy. Symptoms generally happen within 30 minutes of eating a food.  Your symptoms have not gone away within 2 days or are getting worse.  You develop new symptoms.  You want to retest yourself or your child with a food or drink you think causes an allergic reaction. Never do this if an anaphylactic reaction to that food or drink has happened before. Only do this under the care of a caregiver. SEEK IMMEDIATE MEDICAL CARE IF:   You have difficulty breathing, are wheezing, or have a tight feeling in your chest or throat.  You have a swollen mouth, or you have hives, swelling, or itching all over your body.  You have had a severe reaction that has responded to your anaphylaxis kit or an EpiPen. These reactions may return when the medicine has worn off. These reactions should be considered life threatening. MAKE SURE YOU:   Understand these instructions.  Will watch your condition.  Will get help right away if you are not doing well or get worse. Document Released: 08/06/2002 Document Revised: 08/05/2011 Document Reviewed: 01/11/2008 [facility] Patient Information 2014 [place], [place].

## 2012-11-05 ENCOUNTER — Encounter: Payer: Self-pay | Admitting: Family Medicine

## 2012-11-05 NOTE — Assessment & Plan Note (Signed)
No recent flares on current meds and with dietary adjustments

## 2012-11-05 NOTE — Progress Notes (Signed)
Patient ID: [name], female   DOB: August 14, 1940, 72 y.o.   MRN: [id number] [name] [id number] 1940/11/29 11/05/2012      Progress Note-Follow Up  Subjective  Chief Complaint  Chief Complaint  Patient presents with  . Otitis Media    X 1 week, both ears    HPI  Patient is a 72 year old Caucasian female who is in today complaining of a sense of congestion in her ears and decreased hearing. She has mild nasal congestion as well but denies any fevers and chills. No significant headache. Does have tympanostomy tubes in place placed by Dr. Jearld Fenton of ENT. Struggles with some postnasal drip and cough productive of clear phlegm at times. Has made some major dietary changes trying to avoid genetically modified foods including. IBS symptoms are improved with less dyspepsia and discomfort  Past Medical History  Diagnosis Date  . Pancreatitis chronic   . Esophageal reflux   . Internal hemorrhoids without mention of complication   . Osteoarthritis   . ANXIETY, CHRONIC 08/29/2007  . CHEST PAIN 12/13/2008    LHC 02/2011: normal cors, EF 65%  . HYPOTENSION, ORTHOSTATIC 02/26/2010  . IBS (irritable bowel syndrome) 09/27/2010  . PALPITATIONS, RECURRENT 02/26/2010  . 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  . SHOULDER PAIN 12/29/2007  . VITAMIN D DEFICIENCY 05/01/2007  . TOBACCO USE, QUIT 03/29/2009  . Fibrocystic breast   . 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. Osteopenia   . Allergic state 12/31/2011  . Osteopenia 04/01/2012  . Pain in ear 11/09/2010    Past Surgical History  Procedure Laterality Date  . Abdominal hysterectomy  1998  . Cholecystectomy  1999  . Knee surgery  1958  . Tubal ligation      with D&C  . Breast surgery      breast bx on left, benign  . Cardiac catheterization      Family History  Problem Relation Age of Onset  . Hypertension Mother   . Stroke Mother   . Cancer Father     bladder  . Heart disease Father   . Breast cancer Cousin   . Ovarian cancer Paternal Aunt   . Cancer  Paternal Aunt     breast, ovarian, pancreatic, cervical  . Pancreatic cancer Paternal Aunt   . Stomach cancer Paternal Uncle   . Cancer Paternal Uncle     lung, stomach  . Uterine cancer Paternal Grandmother   . Heart disease Sister   . Breast cancer Paternal Aunt     History   Social History  . Marital Status: Married    Spouse Name: N/A    Number of Children: 1  . Years of Education: N/A   Occupational History  . Retired    Social History Main Topics  . Smoking status: Former Smoker    Types: Cigarettes    Quit date: 05/27/1973  . Smokeless tobacco: Never Used  . Alcohol Use: No  . Drug Use: No  . Sexually Active: Not on file   Other Topics Concern  . Not on file   Social History Narrative  . No narrative on file    Current Outpatient Prescriptions on File Prior to Visit  Medication Sig Dispense Refill  . Ascorbic Acid (VITAMIN C) 500 MG tablet Take 500 mg by mouth daily.        Marland Kitchen aspirin 81 MG tablet Take 81 mg by mouth 2 (two) times daily.        . cetirizine (ZYRTEC) 10 MG tablet Take  10 mg by mouth daily.        . Cholecalciferol (VITAMIN D3) 1000 UNITS CAPS Take 1 tablet by mouth daily.        Marland Kitchen CINNAMON PO Take by mouth daily.      . Cranberry 125 MG TABS Take 1 tablet by mouth daily.        Marland Kitchen escitalopram (LEXAPRO) 10 MG tablet Take 1 tablet (10 mg total) by mouth daily. Note change in dosing  90 tablet  3  . estradiol (VIVELLE-DOT) 0.025 MG/24HR Place 1 patch onto the skin 2 (two) times a week.  24 patch  3  . hydrocortisone (ANUSOL-HC) 25 MG suppository Place 25 mg rectally daily.        . hyoscyamine (LEVSIN/SL) 0.125 MG SL tablet Place 0.125 mg under the tongue every 4 (four) hours as needed.        Marland Kitchen METROGEL 1 % gel BID times 48H.      . Multiple Vitamin (MULTIVITAMIN) capsule Take 1 capsule by mouth daily.        . Omega-3 Fatty Acids (FISH OIL) 1000 MG CAPS Take 1 capsule by mouth daily.        . Pancrelipase, Lip-Prot-Amyl, (CREON) 24000 UNITS  CPEP Take 6 capsules by mouth with each meal 3 times daily = 18 caps daily  180 capsule  0  . pantoprazole (PROTONIX) 40 MG tablet Take 1 tablet (40 mg total) by mouth 2 (two) times daily.  180 tablet  3  . Probiotic Product (PROBIOTIC PO) Take by mouth. Takes Probaclac probiotic once daily       . scopolamine (TRANSDERM-SCOP) 1.5 MG Place 1 patch onto the skin every 3 (three) days. Only used when traveling       No current facility-administered medications on file prior to visit.    Allergies  Allergen Reactions  . Amoxicillin     REACTION: unspecified  . Azithromycin     REACTION: Rash  . Ciprofloxacin     REACTION: GI upset  . Epinephrine     REACTION: unspecified  . Penicillins     REACTION: rash  . Sulfonamide Derivatives     REACTION: sick to stomach    Review of Systems  Review of Systems  Constitutional: Negative for fever and malaise/fatigue.  HENT: Positive for ear pain and congestion. Negative for hearing loss, nosebleeds, tinnitus and ear discharge.   Eyes: Negative for discharge.  Respiratory: Negative for shortness of breath.   Cardiovascular: Negative for chest pain, palpitations and leg swelling.  Gastrointestinal: Negative for nausea, abdominal pain and diarrhea.  Genitourinary: Negative for dysuria.  Musculoskeletal: Negative for falls.  Skin: Negative for rash.  Neurological: Negative for loss of consciousness and headaches.  Endo/Heme/Allergies: Negative for polydipsia.  Psychiatric/Behavioral: Negative for depression and suicidal ideas. The patient is not nervous/anxious and does not have insomnia.     Objective  BP 128/78  Pulse 68  Temp(Src) 98 F (36.7 C) (Oral)  Ht 5\' 5"  (1.651 m)  Wt 121 lb (54.885 kg)  BMI 20.14 kg/m2  SpO2 95%  Physical Exam  Physical Exam  Constitutional: She is oriented to person, place, and time and well-developed, well-nourished, and in no distress. No distress.  HENT:  Head: Normocephalic and atraumatic.  TM  tubes in place in memebranes  Eyes: Conjunctivae are normal.  Neck: Neck supple. No thyromegaly present.  Cardiovascular: Normal rate, regular rhythm and normal heart sounds.   No murmur heard. Pulmonary/Chest: Effort normal and breath  sounds normal. She has no wheezes.  Abdominal: She exhibits no distension and no mass.  Musculoskeletal: She exhibits no edema.  Lymphadenopathy:    She has no cervical adenopathy.  Neurological: She is alert and oriented to person, place, and time.  Skin: Skin is warm and dry. No rash noted. She is not diaphoretic.  Psychiatric: Memory, affect and judgment normal.    Lab Results  Component Value Date   TSH 2.39 03/25/2012   Lab Results  Component Value Date   WBC 4.8 03/25/2012   HGB 12.6 03/25/2012   HCT 38.2 03/25/2012   MCV 94.6 03/25/2012   PLT 217.0 03/25/2012   Lab Results  Component Value Date   CREATININE 0.8 03/25/2012   BUN 12 03/25/2012   NA 143 03/25/2012   K 3.8 03/25/2012   CL 107 03/25/2012   CO2 31 03/25/2012   Lab Results  Component Value Date   ALT 24 03/25/2012   AST 20 03/25/2012   ALKPHOS 52 03/25/2012   BILITOT 0.5 03/25/2012   Lab Results  Component Value Date   CHOL 160 03/25/2012   Lab Results  Component Value Date   HDL 101.70 03/25/2012   Lab Results  Component Value Date   LDLCALC 54 03/25/2012   Lab Results  Component Value Date   TRIG 24.0 03/25/2012   Lab Results  Component Value Date   CHOLHDL 2 03/25/2012     Assessment & Plan  Pain in ear TM tubes in place no sign of infection, Eustachian tube dysfunction suspected. Encouraged nasal saline, antihistamines.  Inner ear disease Good control  IBS (irritable bowel syndrome) No recent flares on current meds and with dietary adjustments

## 2012-11-05 NOTE — Assessment & Plan Note (Signed)
TM tubes in place no sign of infection, Eustachian tube dysfunction suspected. Encouraged nasal saline, antihistamines.

## 2012-11-05 NOTE — Assessment & Plan Note (Addendum)
Good control

## 2013-01-06 ENCOUNTER — Telehealth: Payer: Self-pay

## 2013-01-06 NOTE — Telephone Encounter (Signed)
Pt left a message stating that she needs a refill on her Creon? Pt takes 6 capsules each meal- 18 daily and would like a 3 month supply (1620 capsules) sent to pharmacy?  Please advise if you can refill this? It looks like Dr Jarold Motto just gave pt samples? Not sure if a follow up was needed with him?  Please advise refill?

## 2013-01-06 NOTE — Telephone Encounter (Signed)
Can send Creon #[id number] with 3 rf

## 2013-01-08 ENCOUNTER — Telehealth: Payer: Self-pay

## 2013-01-08 DIAGNOSIS — I951 Orthostatic hypotension: Secondary | ICD-10-CM

## 2013-01-08 DIAGNOSIS — Z0000 Encounter for general adult medical examination without abnormal findings: Secondary | ICD-10-CM

## 2013-01-08 DIAGNOSIS — K861 Other chronic pancreatitis: Secondary | ICD-10-CM

## 2013-01-08 DIAGNOSIS — E559 Vitamin D deficiency, unspecified: Secondary | ICD-10-CM

## 2013-01-08 MED ORDER — PANCRELIPASE (LIP-PROT-AMYL) 24000-76000 UNITS PO CPEP: ORAL_CAPSULE | ORAL | Status: DC

## 2013-01-08 NOTE — Telephone Encounter (Signed)
Lab order placed.

## 2013-03-01 ENCOUNTER — Other Ambulatory Visit: Payer: Self-pay

## 2013-03-01 MED ORDER — PANTOPRAZOLE SODIUM 40 MG PO TBEC: 40.0000 mg | DELAYED_RELEASE_TABLET | Freq: Two times a day (BID) | ORAL | Status: DC

## 2013-03-01 NOTE — Telephone Encounter (Signed)
Pt has an appt scheduled for Nov 7th 2014. Pt won't have enough protonix to last until then.   RX sent for 90 day supply to [facility]

## 2013-03-26 LAB — LIPID PANEL
Cholesterol: 144 mg/dL (ref 0–200)
LDL Cholesterol: 43 mg/dL (ref 0–99)
Triglycerides: 26 mg/dL (ref <150)

## 2013-03-26 LAB — AMYLASE: Amylase: 80 U/L (ref 0–105)

## 2013-03-26 LAB — HEPATIC FUNCTION PANEL
ALT: 17 U/L (ref 0–35)
AST: 18 U/L (ref 0–37)
Bilirubin, Direct: 0.1 mg/dL (ref 0.0–0.3)
Indirect Bilirubin: 0.3 mg/dL (ref 0.0–0.9)
Total Protein: 6.1 g/dL (ref 6.0–8.3)

## 2013-03-26 LAB — RENAL FUNCTION PANEL
BUN: 14 mg/dL (ref 6–23)
CO2: 29 mEq/L (ref 19–32)
Chloride: 105 mEq/L (ref 96–112)
Creat: 0.92 mg/dL (ref 0.50–1.10)
Potassium: 4.4 mEq/L (ref 3.5–5.3)

## 2013-03-26 LAB — CBC
HCT: 38.7 % (ref 36.0–46.0)
Hemoglobin: 13.6 g/dL (ref 12.0–15.0)
MCV: 88.4 fL (ref 78.0–100.0)
RDW: 13.7 % (ref 11.5–15.5)
WBC: 4.1 10*3/uL (ref 4.0–10.5)

## 2013-04-01 ENCOUNTER — Other Ambulatory Visit: Payer: Self-pay

## 2013-04-02 ENCOUNTER — Ambulatory Visit (INDEPENDENT_AMBULATORY_CARE_PROVIDER_SITE_OTHER): Payer: Medicare Other | Admitting: Family Medicine

## 2013-04-02 ENCOUNTER — Encounter: Payer: Self-pay | Admitting: Family Medicine

## 2013-04-02 VITALS — BP 134/80 | HR 74 | Temp 98.2°F | Ht 65.0 in | Wt 124.0 lb

## 2013-04-02 DIAGNOSIS — R197 Diarrhea, unspecified: Secondary | ICD-10-CM

## 2013-04-02 DIAGNOSIS — Z0000 Encounter for general adult medical examination without abnormal findings: Secondary | ICD-10-CM

## 2013-04-02 DIAGNOSIS — K649 Unspecified hemorrhoids: Secondary | ICD-10-CM

## 2013-04-02 DIAGNOSIS — R030 Elevated blood-pressure reading, without diagnosis of hypertension: Secondary | ICD-10-CM

## 2013-04-02 DIAGNOSIS — H6990 Unspecified Eustachian tube disorder, unspecified ear: Secondary | ICD-10-CM

## 2013-04-02 DIAGNOSIS — K589 Irritable bowel syndrome without diarrhea: Secondary | ICD-10-CM

## 2013-04-02 DIAGNOSIS — N390 Urinary tract infection, site not specified: Secondary | ICD-10-CM

## 2013-04-02 DIAGNOSIS — F411 Generalized anxiety disorder: Secondary | ICD-10-CM

## 2013-04-02 DIAGNOSIS — K8689 Other specified diseases of pancreas: Secondary | ICD-10-CM

## 2013-04-02 DIAGNOSIS — Z23 Encounter for immunization: Secondary | ICD-10-CM

## 2013-04-02 DIAGNOSIS — T753XXA Motion sickness, initial encounter: Secondary | ICD-10-CM

## 2013-04-02 DIAGNOSIS — Z780 Asymptomatic menopausal state: Secondary | ICD-10-CM

## 2013-04-02 DIAGNOSIS — H6983 Other specified disorders of Eustachian tube, bilateral: Secondary | ICD-10-CM

## 2013-04-02 DIAGNOSIS — H6980 Other specified disorders of Eustachian tube, unspecified ear: Secondary | ICD-10-CM

## 2013-04-02 DIAGNOSIS — K219 Gastro-esophageal reflux disease without esophagitis: Secondary | ICD-10-CM

## 2013-04-02 DIAGNOSIS — K861 Other chronic pancreatitis: Secondary | ICD-10-CM

## 2013-04-02 MED ORDER — PANTOPRAZOLE SODIUM 40 MG PO TBEC: 40.0000 mg | DELAYED_RELEASE_TABLET | Freq: Two times a day (BID) | ORAL | Status: DC

## 2013-04-02 MED ORDER — CEFDINIR 300 MG PO CAPS: 300.0000 mg | ORAL_CAPSULE | Freq: Two times a day (BID) | ORAL | Status: AC

## 2013-04-02 MED ORDER — ESTRADIOL 0.025 MG/24HR TD PTTW: 1.0000 | MEDICATED_PATCH | TRANSDERMAL | Status: DC

## 2013-04-02 MED ORDER — CHOLESTYRAMINE 4 GM/DOSE PO POWD: 4.0000 g | Freq: Three times a day (TID) | ORAL | Status: DC

## 2013-04-02 MED ORDER — SCOPOLAMINE 1 MG/3DAYS TD PT72: 1.0000 | MEDICATED_PATCH | TRANSDERMAL | Status: DC

## 2013-04-02 MED ORDER — HYDROCORTISONE ACETATE 25 MG RE SUPP: 25.0000 mg | Freq: Every day | RECTAL | Status: DC

## 2013-04-02 NOTE — Progress Notes (Signed)
Patient ID: [name], female   DOB: [birth year], 72 y.o.   MRN: [id number] [name] [id number] [birth year] 04/02/2013      Progress Note-Follow Up  Subjective  Chief Complaint  Chief Complaint  Patient presents with  . Annual Exam    physical  . Injections    flu    HPI  Patient is a 72 year old caucasian female in today for routine medical care. Continues to have GI issues but has them relatively well controlled. Takes creon routinely and avoids offending foods. She is heading out on a cruise soon and is worried about how she will do . Is going to [place] and [place]. No recent illness, fevers, chills, chest pain, palp, sob, gu complaints has recently had new tympanostomy tubes placed without difficulty  Past Medical History  Diagnosis Date  . Pancreatitis chronic   . Esophageal reflux   . Internal hemorrhoids without mention of complication   . Osteoarthritis   . ANXIETY, CHRONIC 08/29/2007  . CHEST PAIN 12/13/2008    LHC 02/2011: normal cors, EF 65%  . HYPOTENSION, ORTHOSTATIC 02/26/2010  . IBS (irritable bowel syndrome) 09/27/2010  . PALPITATIONS, RECURRENT 02/26/2010  . 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  . SHOULDER PAIN 12/29/2007  . VITAMIN D DEFICIENCY 05/01/2007  . TOBACCO USE, QUIT 03/29/2009  . Fibrocystic breast   . 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. Osteopenia   . Allergic state 12/31/2011  . Osteopenia 04/01/2012  . Pain in ear 11/09/2010    Past Surgical History  Procedure Laterality Date  . Abdominal hysterectomy  1998  . Cholecystectomy  1999  . Knee surgery  1958  . Tubal ligation      with D&C  . Breast surgery      breast bx on left, benign  . Cardiac catheterization      Family History  Problem Relation Age of Onset  . Hypertension Mother   . Stroke Mother   . Cancer Father     bladder  . Heart disease Father   . Breast cancer Cousin   . Ovarian cancer Paternal Aunt   . Cancer Paternal Aunt     breast, ovarian, pancreatic, cervical  . Pancreatic  cancer Paternal Aunt   . Stomach cancer Paternal Uncle   . Cancer Paternal Uncle     lung, stomach  . Uterine cancer Paternal Grandmother   . Heart disease Sister   . Breast cancer Paternal Aunt     History   Social History  . Marital Status: Married    Spouse Name: N/A    Number of Children: 1  . Years of Education: N/A   Occupational History  . Retired    Social History Main Topics  . Smoking status: Former Smoker    Types: Cigarettes    Quit date: 05/27/1973  . Smokeless tobacco: Never Used  . Alcohol Use: No  . Drug Use: No  . Sexual Activity: Not on file   Other Topics Concern  . Not on file   Social History Narrative  . No narrative on file    Current Outpatient Prescriptions on File Prior to Visit  Medication Sig Dispense Refill  . Ascorbic Acid (VITAMIN C) 500 MG tablet Take 500 mg by mouth daily.        Marland Kitchen aspirin 81 MG tablet Take 81 mg by mouth 2 (two) times daily.        . cetirizine (ZYRTEC) 10 MG tablet Take 10 mg by mouth daily.        Marland Kitchen  Cholecalciferol (VITAMIN D3) 1000 UNITS CAPS Take 1 tablet by mouth daily.        Marland Kitchen CINNAMON PO Take by mouth daily.      . Cranberry 125 MG TABS Take 1 tablet by mouth daily.        Marland Kitchen escitalopram (LEXAPRO) 10 MG tablet Take 1 tablet (10 mg total) by mouth daily. Note change in dosing  90 tablet  3  . estradiol (VIVELLE-DOT) 0.025 MG/24HR Place 1 patch onto the skin 2 (two) times a week.  24 patch  3  . fluticasone (FLONASE) 50 MCG/ACT nasal spray Place 2 sprays into the nose as needed for rhinitis or allergies.  16 g  5  . hydrocortisone (ANUSOL-HC) 25 MG suppository Place 25 mg rectally daily.        . hyoscyamine (LEVSIN/SL) 0.125 MG SL tablet Place 0.125 mg under the tongue every 4 (four) hours as needed.        Marland Kitchen METROGEL 1 % gel BID times 48H.      . montelukast (SINGULAIR) 10 MG tablet Take 1 tablet (10 mg total) by mouth at bedtime as needed. allergies  30 tablet  3  . Multiple Vitamin (MULTIVITAMIN) capsule  Take 1 capsule by mouth daily.        . Omega-3 Fatty Acids (FISH OIL) 1000 MG CAPS Take 1 capsule by mouth daily.        . Pancrelipase, Lip-Prot-Amyl, (CREON) 24000 UNITS CPEP Take 6 capsules by mouth with each meal 3 times daily = 18 caps daily  1620 capsule  3  . pantoprazole (PROTONIX) 40 MG tablet Take 1 tablet (40 mg total) by mouth 2 (two) times daily.  180 tablet  0  . Probiotic Product (PROBIOTIC PO) Take by mouth. Takes Probaclac probiotic once daily       . scopolamine (TRANSDERM-SCOP) 1.5 MG Place 1 patch onto the skin every 3 (three) days. Only used when traveling       No current facility-administered medications on file prior to visit.    Allergies  Allergen Reactions  . Amoxicillin     REACTION: unspecified  . Azithromycin     REACTION: Rash  . Ciprofloxacin     REACTION: GI upset  . Epinephrine     REACTION: unspecified  . Penicillins     REACTION: rash  . Sulfonamide Derivatives     REACTION: sick to stomach    Review of Systems  Review of Systems  Constitutional: Negative for fever, chills and malaise/fatigue.  HENT: Positive for ear pain. Negative for congestion, hearing loss and nosebleeds.   Eyes: Negative for discharge.  Respiratory: Negative for cough, sputum production, shortness of breath and wheezing.   Cardiovascular: Negative for chest pain, palpitations and leg swelling.  Gastrointestinal: Negative for heartburn, nausea, vomiting, abdominal pain, diarrhea, constipation and blood in stool.  Genitourinary: Negative for dysuria, urgency, frequency and hematuria.  Musculoskeletal: Negative for back pain, falls and myalgias.  Skin: Negative for rash.  Neurological: Negative for dizziness, tremors, sensory change, focal weakness, loss of consciousness, weakness and headaches.  Endo/Heme/Allergies: Negative for polydipsia. Does not bruise/bleed easily.  Psychiatric/Behavioral: Negative for depression and suicidal ideas. The patient is not  nervous/anxious and does not have insomnia.     Objective  BP 134/80  Pulse 74  Temp(Src) 98.2 F (36.8 C) (Oral)  Ht 5\' 5"  (1.651 m)  Wt 124 lb 0.6 oz (56.264 kg)  BMI 20.64 kg/m2  SpO2 94%  Physical Exam  Physical  Exam  Constitutional: She is oriented to person, place, and time and well-developed, well-nourished, and in no distress. No distress.  HENT:  Head: Normocephalic and atraumatic.  Right Ear: External ear normal.  Left Ear: External ear normal.  Nose: Nose normal.  Mouth/Throat: Oropharynx is clear and moist. No oropharyngeal exudate.  Eyes: Conjunctivae are normal. Pupils are equal, round, and reactive to light. Right eye exhibits no discharge. Left eye exhibits no discharge. No scleral icterus.  Neck: Normal range of motion. Neck supple. No thyromegaly present.  Cardiovascular: Normal rate, regular rhythm, normal heart sounds and intact distal pulses.   No murmur heard. Pulmonary/Chest: Effort normal and breath sounds normal. No respiratory distress. She has no wheezes. She has no rales.  Abdominal: Soft. Bowel sounds are normal. She exhibits no distension and no mass. There is no tenderness.  Musculoskeletal: Normal range of motion. She exhibits no edema and no tenderness.  Lymphadenopathy:    She has no cervical adenopathy.  Neurological: She is alert and oriented to person, place, and time. She has normal reflexes. No cranial nerve deficit. Coordination normal.  Skin: Skin is warm and dry. No rash noted. She is not diaphoretic.  Psychiatric: Mood, memory and affect normal.    Lab Results  Component Value Date   TSH 4.099 03/26/2013   Lab Results  Component Value Date   WBC 4.1 03/26/2013   HGB 13.6 03/26/2013   HCT 38.7 03/26/2013   MCV 88.4 03/26/2013   PLT 248 03/26/2013   Lab Results  Component Value Date   CREATININE 0.92 03/26/2013   BUN 14 03/26/2013   NA 143 03/26/2013   K 4.4 03/26/2013   CL 105 03/26/2013   CO2 29 03/26/2013   Lab  Results  Component Value Date   ALT 17 03/26/2013   AST 18 03/26/2013   ALKPHOS 53 03/26/2013   BILITOT 0.4 03/26/2013   Lab Results  Component Value Date   CHOL 144 03/26/2013   Lab Results  Component Value Date   HDL 96 03/26/2013   Lab Results  Component Value Date   LDLCALC 43 03/26/2013   Lab Results  Component Value Date   TRIG 26 03/26/2013   Lab Results  Component Value Date   CHOLHDL 1.5 03/26/2013     Assessment & Plan  HEMORRHOIDS, NOS Given refills on anusol HC to use prn  IBS (irritable bowel syndrome) Intermittent diarrhea and constipation, given a trial of Questran 4 gm to use tid prn  Eustachian tube dysfunction New tympanostomy tubes  Some pressure in left ear since those were placed roughly 2 weeks ago.  Elevated blood pressure Well controlled today.   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 Avoid offending foods.   Preventative health care Given flu shot today, encouraged to return for pneumonia shot. Maintain heart healthy diet and regular exercise. No recent concerns. No advanced directives

## 2013-04-02 NOTE — Assessment & Plan Note (Signed)
Intermittent diarrhea and constipation, given a trial of Questran 4 gm to use tid prn

## 2013-04-02 NOTE — Assessment & Plan Note (Signed)
Given refills on anusol HC to use prn

## 2013-04-02 NOTE — Assessment & Plan Note (Signed)
New tympanostomy tubes  Some pressure in left ear since those were placed roughly 2 weeks ago.

## 2013-04-02 NOTE — Patient Instructions (Signed)
  Try the Questran up to 3 times daily for the diarrhea  Diarrhea Diarrhea is frequent loose and watery bowel movements. It can cause you to feel weak and dehydrated. Dehydration can cause you to become tired and thirsty, have a dry mouth, and have decreased urination that often is dark yellow. Diarrhea is a sign of another problem, most often an infection that will not last long. In most cases, diarrhea typically lasts 2 3 days. However, it can last longer if it is a sign of something more serious. It is important to treat your diarrhea as directed by your caregive to lessen or prevent future episodes of diarrhea. CAUSES  Some common causes include:  Gastrointestinal infections caused by viruses, bacteria, or parasites.  Food poisoning or food allergies.  Certain medicines, such as antibiotics, chemotherapy, and laxatives.  Artificial sweeteners and fructose.  Digestive disorders. HOME CARE INSTRUCTIONS  Ensure adequate fluid intake (hydration): have 1 cup (8 oz) of fluid for each diarrhea episode. Avoid fluids that contain simple sugars or sports drinks, fruit juices, whole milk products, and sodas. Your urine should be clear or pale yellow if you are drinking enough fluids. Hydrate with an oral rehydration solution that you can purchase at pharmacies, retail stores, and online. You can prepare an oral rehydration solution at home by mixing the following ingredients together:    tsp table salt.   tsp baking soda.   tsp salt substitute containing potassium chloride.  1  tablespoons sugar.  1 L (34 oz) of water.  Certain foods and beverages may increase the speed at which food moves through the gastrointestinal (GI) tract. These foods and beverages should be avoided and include:  Caffeinated and alcoholic beverages.  High-fiber foods, such as raw fruits and vegetables, nuts, seeds, and whole grain breads and cereals.  Foods and beverages sweetened with sugar alcohols, such as  xylitol, sorbitol, and mannitol.  Some foods may be well tolerated and may help thicken stool including:  Starchy foods, such as rice, toast, pasta, low-sugar cereal, oatmeal, grits, baked potatoes, crackers, and bagels.  Bananas.  Applesauce.  Add probiotic-rich foods to help increase healthy bacteria in the GI tract, such as yogurt and fermented milk products.  Wash your hands well after each diarrhea episode.  Only take over-the-counter or prescription medicines as directed by your caregiver.  Take a warm bath to relieve any burning or pain from frequent diarrhea episodes. SEEK IMMEDIATE MEDICAL CARE IF:   You are unable to keep fluids down.  You have persistent vomiting.  You have blood in your stool, or your stools are black and tarry.  You do not urinate in 6 8 hours, or there is only a small amount of very dark urine.  You have abdominal pain that increases or localizes.  You have weakness, dizziness, confusion, or lightheadedness.  You have a severe headache.  Your diarrhea gets worse or does not get better.  You have a fever or persistent symptoms for more than 2 3 days.  You have a fever and your symptoms suddenly get worse. MAKE SURE YOU:   Understand these instructions.  Will watch your condition.  Will get help right away if you are not doing well or get worse. Document Released: 05/03/2002 Document Revised: 04/29/2012 Document Reviewed: 01/19/2012 [facility] Patient Information 2014 [place], [place].

## 2013-04-04 ENCOUNTER — Encounter: Payer: Self-pay | Admitting: Family Medicine

## 2013-04-04 DIAGNOSIS — Z0000 Encounter for general adult medical examination without abnormal findings: Secondary | ICD-10-CM | POA: Insufficient documentation

## 2013-04-04 NOTE — Assessment & Plan Note (Signed)
Doing well on Creon. Avoid offending foods.

## 2013-04-04 NOTE — Assessment & Plan Note (Signed)
Given flu shot today, encouraged to return for pneumonia shot. Maintain heart healthy diet and regular exercise. No recent concerns. No advanced directives

## 2013-04-04 NOTE — Assessment & Plan Note (Signed)
Well controlled today.

## 2013-06-07 ENCOUNTER — Ambulatory Visit: Payer: Medicare Other | Admitting: Internal Medicine

## 2013-06-07 ENCOUNTER — Encounter: Payer: Self-pay | Admitting: Family Medicine

## 2013-06-07 ENCOUNTER — Other Ambulatory Visit: Payer: Self-pay | Admitting: Family Medicine

## 2013-06-21 ENCOUNTER — Telehealth: Payer: Self-pay | Admitting: Family Medicine

## 2013-06-21 NOTE — Telephone Encounter (Signed)
Patient states that she and Dr. Charlett Blake had talked about her getting an [place] at her last visit because of the back pain she has been having. Patient would like to proceed with [place]

## 2013-06-22 NOTE — Telephone Encounter (Signed)
Please advise 

## 2013-06-23 NOTE — Telephone Encounter (Signed)
So am willing to order but I need more details about what her symptoms are now. Is the pain mid back? Does it come and go or stay constantly. Usually ultrasound helps best if pain radiates to front. Any further diarrhea? Nausea etc. Then I get insurance to cover test.

## 2013-06-23 NOTE — Telephone Encounter (Signed)
Left message for pt to return my call.

## 2013-07-01 ENCOUNTER — Encounter: Payer: Self-pay | Admitting: Internal Medicine

## 2013-07-01 ENCOUNTER — Ambulatory Visit (INDEPENDENT_AMBULATORY_CARE_PROVIDER_SITE_OTHER): Payer: Medicare Other | Admitting: Internal Medicine

## 2013-07-01 VITALS — BP 130/90 | HR 70 | Ht 65.0 in | Wt 122.4 lb

## 2013-07-01 DIAGNOSIS — I951 Orthostatic hypotension: Secondary | ICD-10-CM

## 2013-07-01 DIAGNOSIS — I471 Supraventricular tachycardia: Secondary | ICD-10-CM

## 2013-07-01 DIAGNOSIS — R079 Chest pain, unspecified: Secondary | ICD-10-CM

## 2013-07-01 NOTE — Assessment & Plan Note (Signed)
Relatively stable  Will encourage more aggressive volume repletion

## 2013-07-01 NOTE — Patient Instructions (Signed)
Your physician recommends that you continue on your current medications as directed. Please refer to the Current Medication list given to you today.  Your physician wants you to follow-up in: 1 year with Dr. Klein.  You will receive a reminder letter in the mail two months in advance. If you don't receive a letter, please call our office to schedule the follow-up appointment.  

## 2013-07-01 NOTE — Assessment & Plan Note (Signed)
occ events but very brief and not limiting in any way

## 2013-07-01 NOTE — Progress Notes (Signed)
Patient Care Team: Mosie Lukes, MD as PCP - General (Family Medicine) Deboraha Sprang, MD as Consulting Physician (Cardiology) Sable Feil, MD as Consulting Physician (Gastroenterology) Verdis Frederickson, MD as Consulting Physician (Obstetrics and Gynecology)   HPI  [name] is a 73 y.o. female Seen in followup. She has a history of recurrent chest pain. Myoview scanning 2010 was normal. Because of recurrent chest pain, 2012 she underwent catheterization that was normal  She has had some problems with orthostatic intolerance which is worse in the summer. She tries to maintain volume repletion  She her husband travels extensively  She has occasional tachypalpitations. 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 She is quite concerned about its implications and is relatively unsatisfied with the answer she is being given here today  Past Medical History  Diagnosis Date  . Pancreatitis chronic   . Esophageal reflux   . Internal hemorrhoids without mention of complication   . Osteoarthritis   . ANXIETY, CHRONIC 08/29/2007  . CHEST PAIN 12/13/2008    LHC 02/2011: normal cors, EF 65%  . HYPOTENSION, ORTHOSTATIC 02/26/2010  . IBS (irritable bowel syndrome) 09/27/2010  . PALPITATIONS, RECURRENT 02/26/2010  . 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  . SHOULDER PAIN 12/29/2007  . VITAMIN D DEFICIENCY 05/01/2007  . TOBACCO USE, QUIT 03/29/2009  . Fibrocystic breast   . 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. Osteopenia   . Allergic state 12/31/2011  . Osteopenia 04/01/2012  . Pain in ear 11/09/2010  . Preventative health care 04/04/2013    Past Surgical History  Procedure Laterality Date  . Abdominal hysterectomy  1998  . Cholecystectomy  1999  . Knee surgery  1958  . Tubal ligation      with D&C  . Breast surgery      breast bx on left, benign  . Cardiac catheterization      Current  Outpatient Prescriptions  Medication Sig Dispense Refill  . Ascorbic Acid (VITAMIN C) 500 MG tablet Take 500 mg by mouth daily.        Marland Kitchen aspirin 81 MG tablet Take 81 mg by mouth 2 (two) times daily.        . cetirizine (ZYRTEC) 10 MG tablet Take 10 mg by mouth daily.        . Cholecalciferol (VITAMIN D3) 1000 UNITS CAPS Take 1 tablet by mouth daily.        Marland Kitchen CINNAMON PO Take by mouth daily.      . Cranberry 125 MG TABS Take 1 tablet by mouth daily.        Marland Kitchen escitalopram (LEXAPRO) 10 MG tablet Take 1 tablet (10 mg total) by mouth daily. Note change in dosing  90 tablet  3  . estradiol (VIVELLE-DOT) 0.025 MG/24HR Place 1 patch onto the skin 2 (two) times a week.  24 patch  3  . fluticasone (FLONASE) 50 MCG/ACT nasal spray Place 2 sprays into the nose as needed for rhinitis or allergies.  16 g  5  . hydrocortisone (ANUSOL-HC) 25 MG suppository Place 1 suppository (25 mg total) rectally daily.  12 suppository  2  . hyoscyamine (LEVSIN/SL) 0.125 MG SL tablet Place 0.125 mg under the tongue every 4 (four) hours as needed.        Marland Kitchen METROGEL 1 % gel BID times 48H.      Marland Kitchen Omega-3 Fatty Acids (FISH OIL) 1000 MG CAPS Take 1 capsule  by mouth daily.        . Pancrelipase, Lip-Prot-Amyl, (CREON) 24000 UNITS CPEP Take 6 capsules by mouth with each meal 3 times daily = 18 caps daily  1620 capsule  3  . pantoprazole (PROTONIX) 40 MG tablet Take 1 tablet by mouth two  times daily  180 tablet  1  . Probiotic Product (PROBIOTIC PO) Take by mouth. Takes Probaclac probiotic once daily       . scopolamine (TRANSDERM-SCOP) 1.5 MG Place 1 patch (1.5 mg total) onto the skin every 3 (three) days. Only used when traveling  10 patch  0   No current facility-administered medications for this visit.    Allergies  Allergen Reactions  . Amoxicillin     REACTION: unspecified  . Azithromycin     REACTION: Rash  . Ciprofloxacin     REACTION: GI upset  . Epinephrine     REACTION: unspecified  . Penicillins      REACTION: rash  . Sulfonamide Derivatives     REACTION: sick to stomach    Review of Systems negative except from HPI and PMH  Physical Exam BP 130/90  Pulse 70  Ht 5\' 5"  (1.651 m)  Wt 122 lb 6.4 oz (55.52 kg)  BMI 20.37 kg/m2 Well developed and nourished in no acute distress HENT normal Neck supple with JVP-flat Clear Regular rate and rhythm, no murmurs or gallops Abd-soft with active BS No Clubbing cyanosis edema Skin-warm and dry A & Oriented  Grossly normal sensory and motor function       Assessment and  Plan

## 2013-07-06 ENCOUNTER — Other Ambulatory Visit: Payer: Self-pay | Admitting: Family Medicine

## 2013-07-06 DIAGNOSIS — R109 Unspecified abdominal pain: Secondary | ICD-10-CM

## 2013-07-06 NOTE — Telephone Encounter (Signed)
Ordered ultrasound

## 2013-07-06 NOTE — Telephone Encounter (Signed)
Spoke with pt. She states pain has subsided at present. Reports history of autoimmune pancreatitis and saw Dr Sharlett Iles for this a couple of years ago. She states he is getting ready to retire. Thinks it has been about 3-4 years since her last abdominal ultrasound and states she usually has this done to monitor her pancreas. Reports that she will have intermittent pain in the middle of her back as well as her abdomen. Sometimes pain is on the left side and other times it is on the right side of her abdomen. Notes intermittent nausea and was told that she has small duct pancrease disease as well that she feels contributes to the nausea. Pt stated that she will call us back when her pain returns.

## 2013-07-06 NOTE — Telephone Encounter (Signed)
Noted  

## 2013-07-08 ENCOUNTER — Ambulatory Visit (HOSPITAL_BASED_OUTPATIENT_CLINIC_OR_DEPARTMENT_OTHER)
Admission: RE | Admit: 2013-07-08 | Discharge: 2013-07-08 | Disposition: A | Discharge status: Home / Self Care | Payer: Medicare Other | Source: Ambulatory Visit | Attending: Family Medicine | Admitting: Family Medicine

## 2013-07-08 DIAGNOSIS — K7689 Other specified diseases of liver: Secondary | ICD-10-CM | POA: Insufficient documentation

## 2013-07-08 DIAGNOSIS — R109 Unspecified abdominal pain: Secondary | ICD-10-CM

## 2013-07-08 DIAGNOSIS — M549 Dorsalgia, unspecified: Secondary | ICD-10-CM | POA: Insufficient documentation

## 2013-07-21 ENCOUNTER — Ambulatory Visit (INDEPENDENT_AMBULATORY_CARE_PROVIDER_SITE_OTHER): Payer: Medicare Other | Admitting: Surgery

## 2013-07-21 ENCOUNTER — Encounter (INDEPENDENT_AMBULATORY_CARE_PROVIDER_SITE_OTHER): Payer: Self-pay | Admitting: Surgery

## 2013-07-21 VITALS — BP 124/74 | HR 72 | Temp 98.0°F | Resp 18 | Ht 68.0 in | Wt 119.0 lb

## 2013-07-21 DIAGNOSIS — N6019 Diffuse cystic mastopathy of unspecified breast: Secondary | ICD-10-CM

## 2013-07-21 NOTE — Progress Notes (Signed)
[place], MD,  [address]  [place], [place]    [place] Phone:  [phone] FAX:  [phone]   Re:   [name] DOB:   16-Dec-1940 MRN:   [id number]  ASSESSMENT AND PLAN: 1.  Fibrocystic disease of both breasts.  Formerly followed by Dr. Charlynne Pander.  She does not like to check herself.  She has no new finding or complaint.  Follow up in one year.  2.  History of chronic pancreatitis.  Autoimmune.  Followed by Dr. Rogers Seeds (he is retiring Feb 2015)  She said that her Ig tests have reverted to normal. 3.  History of cardiac disease.  Followed by Dr. Olin Pia.  She had heart cath 03/25/2011.  Stable.  HISTORY OF PRESENT ILLNESS: Chief Complaint  Patient presents with  . Breast Cancer Long Term Follow Up   [name] is a 73 y.o. (DOB: [birth year])  white female who is a patient of BLYTH, STACEY, MD (she switched from Dr. Alain Marion in 2012) and comes to me today for follow up of fibrocystic disease.   Comes by self. Had left breast biopsy 10/27/1997 which showed fibrocystic disease.  She has a head cold. She has no new concern or problem. She had some abdominal pain about 10 days ago.  An abdominal US 07/08/2013 was negative. We talked about Dr. Sharlett Iles retiring.  She has this peculiar autoimmune pancreatitis - she said that she is the only one that Dr. Sharlett Iles has who is like that.  For her pancreatitis, she takes 18 Creon tablets daily (this cost $6,000/3 months).  Past Med History: History of cholecystectomy - [facility]  Social History: Married  PHYSICAL EXAM: BP 124/74  Pulse 72  Temp(Src) 98 F (36.7 C)  Resp 18  Ht 5\' 8"  (1.727 m)  Wt 119 lb (53.978 kg)  BMI 18.10 kg/m2  GENERAL:  Thin WN WF. HEENT:  Pupils equal.   NECK:  Supple.  No thyroid mass. LYMPH NODES:  No cervical, supraclavicular, or axillary adenopathy. BREASTS -  RIGHT:  Both breast are lumpy, but nothing suspicious.  No  palpable mass or nodule.  No nipple discharge.    LEFT:  No palpable mass or nodule.  No nipple discharge. UPPER EXTREMITIES:  No evidence of lymphedema.  DATA REVIEWED: Last mammogram, [facility] - 05/13/2013 - Negative.  Alphonsa Overall, MD, [place] Office:  [phone]

## 2013-09-08 ENCOUNTER — Telehealth: Payer: Self-pay | Admitting: Family Medicine

## 2013-09-08 DIAGNOSIS — Z780 Asymptomatic menopausal state: Secondary | ICD-10-CM

## 2013-09-08 MED ORDER — ESTRADIOL 0.025 MG/24HR TD PTTW: 1.0000 | MEDICATED_PATCH | TRANSDERMAL | Status: DC

## 2013-09-08 NOTE — Telephone Encounter (Signed)
Requesting refill on Vivelle-DOT DIS 0.025mg 

## 2013-09-08 NOTE — Telephone Encounter (Signed)
Pt also left message that Rx is going to require PA and to call [phone] ([facility]). We have not received this communication in writing from insurance or mail order. Sent rx. Pt was due for follow up this month. Please call pt to arrange appt.

## 2013-09-09 NOTE — Telephone Encounter (Signed)
appt scheduled for 09/24/13

## 2013-09-24 ENCOUNTER — Encounter: Payer: Self-pay | Admitting: Family Medicine

## 2013-09-24 ENCOUNTER — Ambulatory Visit (INDEPENDENT_AMBULATORY_CARE_PROVIDER_SITE_OTHER): Payer: Medicare Other | Admitting: Family Medicine

## 2013-09-24 VITALS — BP 130/80 | HR 75 | Temp 98.4°F | Ht 65.0 in | Wt 118.0 lb

## 2013-09-24 DIAGNOSIS — K8689 Other specified diseases of pancreas: Secondary | ICD-10-CM

## 2013-09-24 DIAGNOSIS — K861 Other chronic pancreatitis: Secondary | ICD-10-CM

## 2013-09-24 DIAGNOSIS — F419 Anxiety disorder, unspecified: Secondary | ICD-10-CM

## 2013-09-24 DIAGNOSIS — R030 Elevated blood-pressure reading, without diagnosis of hypertension: Secondary | ICD-10-CM

## 2013-09-24 DIAGNOSIS — Z780 Asymptomatic menopausal state: Secondary | ICD-10-CM

## 2013-09-24 DIAGNOSIS — R002 Palpitations: Secondary | ICD-10-CM

## 2013-09-24 DIAGNOSIS — F411 Generalized anxiety disorder: Secondary | ICD-10-CM

## 2013-09-24 DIAGNOSIS — IMO0001 Reserved for inherently not codable concepts without codable children: Secondary | ICD-10-CM

## 2013-09-24 DIAGNOSIS — Z23 Encounter for immunization: Secondary | ICD-10-CM

## 2013-09-24 DIAGNOSIS — K589 Irritable bowel syndrome without diarrhea: Secondary | ICD-10-CM

## 2013-09-24 MED ORDER — ESTRADIOL 0.025 MG/24HR TD PTTW: 1.0000 | MEDICATED_PATCH | TRANSDERMAL | Status: DC

## 2013-09-24 MED ORDER — ESCITALOPRAM OXALATE 10 MG PO TABS: 10.0000 mg | ORAL_TABLET | Freq: Every day | ORAL | Status: DC

## 2013-09-24 NOTE — Patient Instructions (Signed)
Irritable Bowel Syndrome °Irritable Bowel Syndrome (IBS) is caused by a disturbance of normal bowel function. Other terms used are spastic colon, mucous colitis, and irritable colon. It does not require surgery, nor does it lead to cancer. There is no cure for IBS. But with proper diet, stress reduction, and medication, you will find that your problems (symptoms) will gradually disappear or improve. IBS is a common digestive disorder. It usually appears in late adolescence or early adulthood. Women develop it twice as often as men. °CAUSES  °After food has been digested and absorbed in the small intestine, waste material is moved into the colon (large intestine). In the colon, water and salts are absorbed from the undigested products coming from the small intestine. The remaining residue, or fecal material, is held for elimination. Under normal circumstances, gentle, rhythmic contractions on the bowel walls push the fecal material along the colon towards the rectum. In IBS, however, these contractions are irregular and poorly coordinated. The fecal material is either retained too long, resulting in constipation, or expelled too soon, producing diarrhea. °SYMPTOMS  °The most common symptom of IBS is pain. It is typically in the lower left side of the belly (abdomen). But it may occur anywhere in the abdomen. It can be felt as heartburn, backache, or even as a dull pain in the arms or shoulders. The pain comes from excessive bowel-muscle spasms and from the buildup of gas and fecal material in the colon. This pain: °· Can range from sharp belly (abdominal) cramps to a dull, continuous ache. °· Usually worsens soon after eating. °· Is typically relieved by having a bowel movement or passing gas. °Abdominal pain is usually accompanied by constipation. But it may also produce diarrhea. The diarrhea typically occurs right after a meal or upon arising in the morning. The stools are typically soft and watery. They are often  flecked with secretions (mucus). °Other symptoms of IBS include: °· Bloating. °· Loss of appetite. °· Heartburn. °· Feeling sick to your stomach (nausea). °· Belching °· Vomiting °· Gas. °IBS may also cause a number of symptoms that are unrelated to the digestive system: °· Fatigue. °· Headaches. °· Anxiety °· Shortness of breath °· Difficulty in concentrating. °· Dizziness. °These symptoms tend to come and go. °DIAGNOSIS  °The symptoms of IBS closely mimic the symptoms of other, more serious digestive disorders. So your caregiver may wish to perform a variety of additional tests to exclude these disorders. He/she wants to be certain of learning what is wrong (diagnosis). The nature and purpose of each test will be explained to you. °TREATMENT °A number of medications are available to help correct bowel function and/or relieve bowel spasms and abdominal pain. Among the drugs available are: °· Mild, non-irritating laxatives for severe constipation and to help restore normal bowel habits. °· Specific anti-diarrheal medications to treat severe or prolonged diarrhea. °· Anti-spasmodic agents to relieve intestinal cramps. °· Your caregiver may also decide to treat you with a mild tranquilizer or sedative during unusually stressful periods in your life. °The important thing to remember is that if any drug is prescribed for you, make sure that you take it exactly as directed. Make sure that your caregiver knows how well it worked for you. °HOME CARE INSTRUCTIONS  °· Avoid foods that are high in fat or oils. Some examples are:heavy cream, butter, frankfurters, sausage, and other fatty meats. °· Avoid foods that have a laxative effect, such as fruit, fruit juice, and dairy products. °· Cut out   carbonated drinks, chewing gum, and "gassy" foods, such as beans and cabbage. This may help relieve bloating and belching. °· Bran taken with plenty of liquids may help relieve constipation. °· Keep track of what foods seem to trigger  your symptoms. °· Avoid emotionally charged situations or circumstances that produce anxiety. °· Start or continue exercising. °· Get plenty of rest and sleep. °MAKE SURE YOU:  °· Understand these instructions. °· Will watch your condition. °· Will get help right away if you are not doing well or get worse. °Document Released: 05/13/2005 Document Revised: 08/05/2011 Document Reviewed: 01/01/2008 °[employer]® Patient Information ©2014 [employer]. ° °

## 2013-09-24 NOTE — Progress Notes (Signed)
Pre visit review using our clinic review tool, if applicable. No additional management support is needed unless otherwise documented below in the visit note. 

## 2013-09-26 ENCOUNTER — Encounter: Payer: Self-pay | Admitting: Family Medicine

## 2013-09-26 DIAGNOSIS — Z780 Asymptomatic menopausal state: Secondary | ICD-10-CM | POA: Insufficient documentation

## 2013-09-26 NOTE — Assessment & Plan Note (Signed)
Is doing well on vivelle dot and would like to continue this despite understanding the risks involved.

## 2013-09-26 NOTE — Assessment & Plan Note (Signed)
Avoid offending foods, start probiotics. Do not eat large meals in late evening and consider raising head of bed.  

## 2013-09-26 NOTE — Assessment & Plan Note (Signed)
Well controlled recently

## 2013-09-26 NOTE — Progress Notes (Signed)
Patient ID: [name], female   DOB: [birth year], 73 y.o.   MRN: [id number] NIOMA MCCUBBINS [id number] [birth year] 09/26/2013      Progress Note-Follow Up  Subjective  Chief Complaint  Chief Complaint  Patient presents with  . Follow-up  . Injections    prevnar    HPI  Patient is a 73 year old female in today for routine medical care. He should be he is continuing to struggle with intermittent diarrhea. She reports she can related to something she can at times she is not able. No recent abdominal pain  Etc. doing episodes of diarrhea. No recent fevers or chills. No nausea or vomiting. Denies CP/palp/SOB/HA/congestion/fevers/GI or GU c/o. Taking meds as prescribed  Past Medical History  Diagnosis Date  . Pancreatitis chronic   . Esophageal reflux   . Internal hemorrhoids without mention of complication   . Osteoarthritis   . ANXIETY, CHRONIC 08/29/2007  . CHEST PAIN 12/13/2008    LHC 02/2011: normal cors, EF 65%  . HYPOTENSION, ORTHOSTATIC 02/26/2010  . IBS (irritable bowel syndrome) 09/27/2010  . PALPITATIONS, RECURRENT 02/26/2010  . 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  . SHOULDER PAIN 12/29/2007  . VITAMIN D DEFICIENCY 05/01/2007  . TOBACCO USE, QUIT 03/29/2009  . Fibrocystic breast   . 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. Osteopenia   . Allergic state 12/31/2011  . Osteopenia 04/01/2012  . Pain in ear 11/09/2010  . Preventative health care 04/04/2013    Past Surgical History  Procedure Laterality Date  . Abdominal hysterectomy  1998  . Cholecystectomy  1999  . Knee surgery  1958  . Tubal ligation      with D&C  . Breast surgery      breast bx on left, benign  . Cardiac catheterization      Family History  Problem Relation Age of Onset  . Hypertension Mother   . Stroke Mother   . Cancer Father     bladder  . Heart disease Father   . Breast cancer Cousin   . Ovarian cancer Paternal Aunt   . Cancer Paternal Aunt     breast, ovarian, pancreatic, cervical  . Pancreatic cancer Paternal Aunt    . Stomach cancer Paternal Uncle   . Cancer Paternal Uncle     lung, stomach  . Uterine cancer Paternal Grandmother   . Cancer Paternal Grandmother     gyn cancer  . Heart disease Sister   . Breast cancer Paternal Aunt   . Heart disease Maternal Grandmother   . Stroke Maternal Grandmother   . Heart disease Maternal Grandfather   . Stroke Maternal Grandfather     History   Social History  . Marital Status: Married    Spouse Name: N/A    Number of Children: 1  . Years of Education: N/A   Occupational History  . Retired    Social History Main Topics  . Smoking status: Former Smoker    Types: Cigarettes    Quit date: 05/27/1973  . Smokeless tobacco: Never Used  . Alcohol Use: No  . Drug Use: No  . Sexual Activity: Yes   Other Topics Concern  . Not on file   Social History Narrative  . No narrative on file    Current Outpatient Prescriptions on File Prior to Visit  Medication Sig Dispense Refill  . Ascorbic Acid (VITAMIN C) 500 MG tablet Take 500 mg by mouth daily.        Marland Kitchen aspirin 81 MG tablet Take 81  mg by mouth 2 (two) times daily.        . cetirizine (ZYRTEC) 10 MG tablet Take 10 mg by mouth daily.        . Cholecalciferol (VITAMIN D3) 1000 UNITS CAPS Take 1 tablet by mouth daily.        Marland Kitchen CINNAMON PO Take by mouth daily.      . Cranberry 125 MG TABS Take 1 tablet by mouth daily.        . fluticasone (FLONASE) 50 MCG/ACT nasal spray Place 2 sprays into the nose as needed for rhinitis or allergies.  16 g  5  . hydrocortisone (ANUSOL-HC) 25 MG suppository Place 1 suppository (25 mg total) rectally daily.  12 suppository  2  . METROGEL 1 % gel BID times 48H.      Marland Kitchen Pancrelipase, Lip-Prot-Amyl, (CREON) 24000 UNITS CPEP Take 6 capsules by mouth with each meal 3 times daily = 18 caps daily  1620 capsule  3  . pantoprazole (PROTONIX) 40 MG tablet Take 1 tablet by mouth two  times daily  180 tablet  1  . Probiotic Product (PROBIOTIC PO) Take by mouth. Takes Probaclac  probiotic once daily       . scopolamine (TRANSDERM-SCOP) 1.5 MG Place 1 patch (1.5 mg total) onto the skin every 3 (three) days. Only used when traveling  10 patch  0   No current facility-administered medications on file prior to visit.    Allergies  Allergen Reactions  . Amoxicillin     REACTION: unspecified  . Azithromycin     REACTION: Rash  . Ciprofloxacin     REACTION: GI upset  . Epinephrine     REACTION: unspecified  . Penicillins     REACTION: rash  . Sulfonamide Derivatives     REACTION: sick to stomach    Review of Systems  Review of Systems  Constitutional: Negative for fever and malaise/fatigue.  HENT: Negative for congestion.   Eyes: Negative for discharge.  Respiratory: Negative for shortness of breath.   Cardiovascular: Negative for chest pain, palpitations and leg swelling.  Gastrointestinal: Positive for diarrhea. Negative for nausea and abdominal pain.  Genitourinary: Negative for dysuria.  Musculoskeletal: Negative for falls.  Skin: Negative for rash.  Neurological: Negative for loss of consciousness and headaches.  Endo/Heme/Allergies: Negative for polydipsia.  Psychiatric/Behavioral: Negative for depression and suicidal ideas. The patient is not nervous/anxious and does not have insomnia.     Objective  BP 130/80  Pulse 75  Temp(Src) 98.4 F (36.9 C) (Oral)  Ht 5\' 5"  (1.651 m)  Wt 118 lb (53.524 kg)  BMI 19.64 kg/m2  SpO2 98%  Physical Exam  Physical Exam  Constitutional: She is oriented to person, place, and time and well-developed, well-nourished, and in no distress. No distress.  HENT:  Head: Normocephalic and atraumatic.  Eyes: Conjunctivae are normal.  Neck: Neck supple. No thyromegaly present.  Cardiovascular: Normal rate, regular rhythm and normal heart sounds.   No murmur heard. Pulmonary/Chest: Effort normal and breath sounds normal. She has no wheezes.  Abdominal: She exhibits no distension and no mass.  Musculoskeletal:  She exhibits no edema.  Lymphadenopathy:    She has no cervical adenopathy.  Neurological: She is alert and oriented to person, place, and time.  Skin: Skin is warm and dry. No rash noted. She is not diaphoretic.  Psychiatric: Memory, affect and judgment normal.    Lab Results  Component Value Date   TSH 4.099 03/26/2013   Lab  Results  Component Value Date   WBC 4.1 03/26/2013   HGB 13.6 03/26/2013   HCT 38.7 03/26/2013   MCV 88.4 03/26/2013   PLT 248 03/26/2013   Lab Results  Component Value Date   CREATININE 0.92 03/26/2013   BUN 14 03/26/2013   NA 143 03/26/2013   K 4.4 03/26/2013   CL 105 03/26/2013   CO2 29 03/26/2013   Lab Results  Component Value Date   ALT 17 03/26/2013   AST 18 03/26/2013   ALKPHOS 53 03/26/2013   BILITOT 0.4 03/26/2013   Lab Results  Component Value Date   CHOL 144 03/26/2013   Lab Results  Component Value Date   HDL 96 03/26/2013   Lab Results  Component Value Date   LDLCALC 43 03/26/2013   Lab Results  Component Value Date   TRIG 26 03/26/2013   Lab Results  Component Value Date   CHOLHDL 1.5 03/26/2013     Assessment & Plan  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Is 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 Is hoping to find a local gastroenterologist who has some experience with her condition  Elevated blood pressure Denies CP/palp/SOB/HA/congestion/fevers/GI or GU c/o. Taking meds as prescribed  Post-menopause Is doing well on vivelle dot and would like to continue this despite understanding the risks involved.   PALPITATIONS, RECURRENT Well controlled recently  IBS (irritable bowel syndrome) Avoid offending foods, start probiotics. Do not eat large meals in late evening and consider raising head of bed.

## 2013-09-26 NOTE — Assessment & Plan Note (Addendum)
Is 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 Is hoping to find a local gastroenterologist who has some experience with her condition

## 2013-09-26 NOTE — Assessment & Plan Note (Signed)
Denies CP/palp/SOB/HA/congestion/fevers/GI or GU c/o. Taking meds as prescribed 

## 2013-09-27 ENCOUNTER — Telehealth: Payer: Self-pay | Admitting: Family Medicine

## 2013-09-27 NOTE — Telephone Encounter (Signed)
Received paperwork for Estradiol, forward paperwork to nurse

## 2013-09-28 NOTE — Telephone Encounter (Signed)
Received denial, forward to nurse

## 2013-10-07 ENCOUNTER — Telehealth: Payer: Self-pay

## 2013-10-07 NOTE — Telephone Encounter (Signed)
Patient left a message stating that the insurance company is stating they need the diagnosis and a couple other things for approval of the Estrodial?

## 2013-10-08 NOTE — Telephone Encounter (Signed)
Can you send me back another pa for this? I don't see where we have done one?

## 2013-10-08 NOTE — Telephone Encounter (Signed)
i know but they are telling patient they need addt information. So we need to do another pa

## 2013-10-08 NOTE — Telephone Encounter (Signed)
PA form and denial sent back to nurse

## 2013-10-08 NOTE — Telephone Encounter (Signed)
See prior phone note, PA for  Estradiol was denied.

## 2013-10-11 NOTE — Telephone Encounter (Signed)
We received an approval letter from 09-24-13 through 10-02-14.  Faxed to [employer] also

## 2013-12-28 ENCOUNTER — Ambulatory Visit (INDEPENDENT_AMBULATORY_CARE_PROVIDER_SITE_OTHER): Payer: Medicare Other | Admitting: Family Medicine

## 2013-12-28 ENCOUNTER — Encounter: Payer: Self-pay | Admitting: Family Medicine

## 2013-12-28 VITALS — BP 122/80 | HR 80 | Temp 98.3°F | Ht 68.0 in | Wt 121.1 lb

## 2013-12-28 DIAGNOSIS — N390 Urinary tract infection, site not specified: Secondary | ICD-10-CM

## 2013-12-28 DIAGNOSIS — H9209 Otalgia, unspecified ear: Secondary | ICD-10-CM

## 2013-12-28 DIAGNOSIS — K219 Gastro-esophageal reflux disease without esophagitis: Secondary | ICD-10-CM

## 2013-12-28 DIAGNOSIS — R030 Elevated blood-pressure reading, without diagnosis of hypertension: Secondary | ICD-10-CM

## 2013-12-28 DIAGNOSIS — Z780 Asymptomatic menopausal state: Secondary | ICD-10-CM

## 2013-12-28 DIAGNOSIS — Z5189 Encounter for other specified aftercare: Secondary | ICD-10-CM

## 2013-12-28 DIAGNOSIS — IMO0001 Reserved for inherently not codable concepts without codable children: Secondary | ICD-10-CM

## 2013-12-28 DIAGNOSIS — T7840XD Allergy, unspecified, subsequent encounter: Secondary | ICD-10-CM

## 2013-12-28 DIAGNOSIS — H9203 Otalgia, bilateral: Secondary | ICD-10-CM

## 2013-12-28 MED ORDER — MONTELUKAST SODIUM 10 MG PO TABS: 10.0000 mg | ORAL_TABLET | Freq: Every day | ORAL | Status: DC

## 2013-12-28 MED ORDER — PANCRELIPASE (LIP-PROT-AMYL) 24000-76000 UNITS PO CPEP: ORAL_CAPSULE | ORAL | Status: DC

## 2013-12-28 MED ORDER — ESTRADIOL 0.025 MG/24HR TD PTTW: 1.0000 | MEDICATED_PATCH | TRANSDERMAL | Status: DC

## 2013-12-28 NOTE — Assessment & Plan Note (Signed)
No recent flares 

## 2013-12-28 NOTE — Assessment & Plan Note (Signed)
Increase Cetirizine to bid, use Flonase daily, nasal saline twice daily and if not enough improvement then restart Montelukast/Singulair 10 mg daily

## 2013-12-28 NOTE — Patient Instructions (Signed)
Increase Cetirizine to bid, use Flonase daily, nasal saline twice daily and if not enough improvement then restart Montelukast/Singulair 10 mg daily  Allergies Allergies may happen from anything your body is sensitive to. This may be food, medicines, pollens, chemicals, and nearly anything around you in everyday life that produces allergens. An allergen is anything that causes an allergy producing substance. Heredity is often a factor in causing these problems. This means you may have some of the same allergies as your parents. Food allergies happen in all age groups. Food allergies are some of the most severe and life threatening. Some common food allergies are cow's milk, seafood, eggs, nuts, wheat, and soybeans. SYMPTOMS   Swelling around the mouth.  An itchy red rash or hives.  Vomiting or diarrhea.  Difficulty breathing. SEVERE ALLERGIC REACTIONS ARE LIFE-THREATENING. This reaction is called anaphylaxis. It can cause the mouth and throat to swell and cause difficulty with breathing and swallowing. In severe reactions only a trace amount of food (for example, peanut oil in a salad) may cause death within seconds. Seasonal allergies occur in all age groups. These are seasonal because they usually occur during the same season every year. They may be a reaction to molds, grass pollens, or tree pollens. Other causes of problems are house dust mite allergens, pet dander, and mold spores. The symptoms often consist of nasal congestion, a runny itchy nose associated with sneezing, and tearing itchy eyes. There is often an associated itching of the mouth and ears. The problems happen when you come in contact with pollens and other allergens. Allergens are the particles in the air that the body reacts to with an allergic reaction. This causes you to release allergic antibodies. Through a chain of events, these eventually cause you to release histamine into the blood stream. Although it is meant to be  protective to the body, it is this release that causes your discomfort. This is why you were given anti-histamines to feel better. If you are unable to pinpoint the offending allergen, it may be determined by skin or blood testing. Allergies cannot be cured but can be controlled with medicine. Hay fever is a collection of all or some of the seasonal allergy problems. It may often be treated with simple over-the-counter medicine such as diphenhydramine. Take medicine as directed. Do not drink alcohol or drive while taking this medicine. Check with your caregiver or package insert for child dosages. If these medicines are not effective, there are many new medicines your caregiver can prescribe. Stronger medicine such as nasal spray, eye drops, and corticosteroids may be used if the first things you try do not work well. Other treatments such as immunotherapy or desensitizing injections can be used if all else fails. Follow up with your caregiver if problems continue. These seasonal allergies are usually not life threatening. They are generally more of a nuisance that can often be handled using medicine. HOME CARE INSTRUCTIONS   If unsure what causes a reaction, keep a diary of foods eaten and symptoms that follow. Avoid foods that cause reactions.  If hives or rash are present:  Take medicine as directed.  You may use an over-the-counter antihistamine (diphenhydramine) for hives and itching as needed.  Apply cold compresses (cloths) to the skin or take baths in cool water. Avoid hot baths or showers. Heat will make a rash and itching worse.  If you are severely allergic:  Following a treatment for a severe reaction, hospitalization is often required for closer follow-up.  Wear a medic-alert bracelet or necklace stating the allergy.  You and your family must learn how to give adrenaline or use an anaphylaxis kit.  If you have had a severe reaction, always carry your anaphylaxis kit or EpiPen  with you. Use this medicine as directed by your caregiver if a severe reaction is occurring. Failure to do so could have a fatal outcome. SEEK MEDICAL CARE IF:  You suspect a food allergy. Symptoms generally happen within 30 minutes of eating a food.  Your symptoms have not gone away within 2 days or are getting worse.  You develop new symptoms.  You want to retest yourself or your child with a food or drink you think causes an allergic reaction. Never do this if an anaphylactic reaction to that food or drink has happened before. Only do this under the care of a caregiver. SEEK IMMEDIATE MEDICAL CARE IF:   You have difficulty breathing, are wheezing, or have a tight feeling in your chest or throat.  You have a swollen mouth, or you have hives, swelling, or itching all over your body.  You have had a severe reaction that has responded to your anaphylaxis kit or an EpiPen. These reactions may return when the medicine has worn off. These reactions should be considered life threatening. MAKE SURE YOU:   Understand these instructions.  Will watch your condition.  Will get help right away if you are not doing well or get worse. Document Released: 08/06/2002 Document Revised: 09/07/2012 Document Reviewed: 01/11/2008 [facility] Patient Information 2015 [place], [place]. This information is not intended to replace advice given to you by your health care provider. Make sure you discuss any questions you have with your health care provider.

## 2013-12-28 NOTE — Progress Notes (Signed)
Pre visit review using our clinic review tool, if applicable. No additional management support is needed unless otherwise documented below in the visit note. 

## 2013-12-28 NOTE — Assessment & Plan Note (Addendum)
Avoid offending foods, start probiotics. Do not eat large meals in late evening and consider raising head of bed. Continues Digestive Advantage, avoids dairy

## 2013-12-28 NOTE — Assessment & Plan Note (Signed)
Well controlled, no changes to meds. Encouraged heart healthy diet such as the DASH diet and exercise as tolerated.  °

## 2013-12-28 NOTE — Assessment & Plan Note (Signed)
Follows with Dr Janace Hoard, has had tubes placed in b/l ears. Had some bleeding in left when tube was initially placed no recurrence, right ear feels stopped up since coming home from a cruise 2 weeks ago, did have to fly

## 2013-12-28 NOTE — Progress Notes (Signed)
Patient ID: [name], female   DOB: 08/05/1940, 73 y.o.   MRN: [id number] [name] [id number] 04/16/1941 12/28/2013      Progress Note-Follow Up  Subjective  Chief Complaint  Chief Complaint  Patient presents with  . Follow-up    3 month    HPI  Patient is a 73 year old female in today for routine medical care. Patient has just returned from a cruise in [place]. Has been struggling with increased ear pressure since returning, only mild discomfort but she reports feeling like she hears in a barrel. Continues to struggle with GI issues but no new concerns. Avoids dairy. Her allergies have been worse since she got home with increased head congestion, PND. No recent illness. Denies CP/palp/SOB/HA/congestion/fevers/GI or GU c/o. Taking meds as prescribed   Past Medical History  Diagnosis Date  . Pancreatitis chronic   . Esophageal reflux   . Internal hemorrhoids without mention of complication   . Osteoarthritis   . ANXIETY, CHRONIC 08/29/2007  . CHEST PAIN 12/13/2008    LHC 02/2011: normal cors, EF 65%  . HYPOTENSION, ORTHOSTATIC 02/26/2010  . IBS (irritable bowel syndrome) 09/27/2010  . PALPITATIONS, RECURRENT 02/26/2010  . 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  . SHOULDER PAIN 12/29/2007  . VITAMIN D DEFICIENCY 05/01/2007  . TOBACCO USE, QUIT 03/29/2009  . Fibrocystic breast   . 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. Osteopenia   . Allergic state 12/31/2011  . Osteopenia 04/01/2012  . Pain in ear 11/09/2010  . Preventative health care 04/04/2013    Past Surgical History  Procedure Laterality Date  . Abdominal hysterectomy  1998  . Cholecystectomy  1999  . Knee surgery  1958  . Tubal ligation      with D&C  . Breast surgery      breast bx on left, benign  . Cardiac catheterization      Family History  Problem Relation Age of Onset  . Hypertension Mother   . Stroke Mother   . Cancer Father     bladder  . Heart disease Father   . Breast cancer Cousin   . Ovarian cancer Paternal Aunt   .  Cancer Paternal Aunt     breast, ovarian, pancreatic, cervical  . Pancreatic cancer Paternal Aunt   . Stomach cancer Paternal Uncle   . Cancer Paternal Uncle     lung, stomach  . Uterine cancer Paternal Grandmother   . Cancer Paternal Grandmother     gyn cancer  . Heart disease Sister   . Breast cancer Paternal Aunt   . Heart disease Maternal Grandmother   . Stroke Maternal Grandmother   . Heart disease Maternal Grandfather   . Stroke Maternal Grandfather     History   Social History  . Marital Status: Married    Spouse Name: N/A    Number of Children: 1  . Years of Education: N/A   Occupational History  . Retired    Social History Main Topics  . Smoking status: Former Smoker    Types: Cigarettes    Quit date: 05/27/1973  . Smokeless tobacco: Never Used  . Alcohol Use: No  . Drug Use: No  . Sexual Activity: Yes   Other Topics Concern  . Not on file   Social History Narrative  . No narrative on file    Current Outpatient Prescriptions on File Prior to Visit  Medication Sig Dispense Refill  . Ascorbic Acid (VITAMIN C) 500 MG tablet Take 500 mg by mouth daily.        Marland Kitchen  aspirin 81 MG tablet Take 81 mg by mouth 2 (two) times daily.        . cetirizine (ZYRTEC) 10 MG tablet Take 10 mg by mouth daily.        . Cholecalciferol (VITAMIN D3) 1000 UNITS CAPS Take 1 tablet by mouth daily.        Marland Kitchen CINNAMON PO Take by mouth daily.      . Cranberry 125 MG TABS Take 1 tablet by mouth daily.        Marland Kitchen escitalopram (LEXAPRO) 10 MG tablet Take 1 tablet (10 mg total) by mouth daily.  90 tablet  3  . fluticasone (FLONASE) 50 MCG/ACT nasal spray Place 2 sprays into the nose as needed for rhinitis or allergies.  16 g  5  . hydrocortisone (ANUSOL-HC) 25 MG suppository Place 1 suppository (25 mg total) rectally daily.  12 suppository  2  . METROGEL 1 % gel BID times 48H.      . pantoprazole (PROTONIX) 40 MG tablet Take 1 tablet by mouth two  times daily  180 tablet  1  . Probiotic  Product (PROBIOTIC PO) Take by mouth. Takes Probaclac probiotic once daily       . scopolamine (TRANSDERM-SCOP) 1.5 MG Place 1 patch (1.5 mg total) onto the skin every 3 (three) days. Only used when traveling  10 patch  0   No current facility-administered medications on file prior to visit.    Allergies  Allergen Reactions  . Amoxicillin     REACTION: unspecified  . Azithromycin     REACTION: Rash  . Ciprofloxacin     REACTION: GI upset  . Epinephrine     REACTION: unspecified  . Penicillins     REACTION: rash  . Sulfonamide Derivatives     REACTION: sick to stomach    Review of Systems  Review of Systems  Constitutional: Negative for fever and malaise/fatigue.  HENT: Positive for congestion, ear pain and hearing loss.   Eyes: Negative for discharge.  Respiratory: Positive for sputum production. Negative for hemoptysis and shortness of breath.   Cardiovascular: Negative for chest pain, palpitations and leg swelling.  Gastrointestinal: Negative for nausea, abdominal pain and diarrhea.  Genitourinary: Negative for dysuria.  Musculoskeletal: Negative for falls.  Skin: Negative for rash.  Neurological: Negative for loss of consciousness and headaches.  Endo/Heme/Allergies: Negative for polydipsia.  Psychiatric/Behavioral: Negative for depression and suicidal ideas. The patient is not nervous/anxious and does not have insomnia.     Objective  BP 122/80  Pulse 80  Temp(Src) 98.3 F (36.8 C) (Oral)  Ht 5\' 8"  (1.727 m)  Wt 121 lb 1.9 oz (54.94 kg)  BMI 18.42 kg/m2  SpO2 96%  Physical Exam  Physical Exam  Constitutional: She is oriented to person, place, and time and well-developed, well-nourished, and in no distress. No distress.  HENT:  Head: Normocephalic and atraumatic.  Eyes: Conjunctivae are normal.  Neck: Neck supple. No thyromegaly present.  Cardiovascular: Normal rate, regular rhythm and normal heart sounds.   No murmur heard. Pulmonary/Chest: Effort  normal and breath sounds normal. She has no wheezes.  Abdominal: She exhibits no distension and no mass.  Musculoskeletal: She exhibits no edema.  Lymphadenopathy:    She has no cervical adenopathy.  Neurological: She is alert and oriented to person, place, and time.  Skin: Skin is warm and dry. No rash noted. She is not diaphoretic.  Psychiatric: Memory, affect and judgment normal.    Lab Results  Component  Value Date   TSH 4.099 03/26/2013   Lab Results  Component Value Date   WBC 4.1 03/26/2013   HGB 13.6 03/26/2013   HCT 38.7 03/26/2013   MCV 88.4 03/26/2013   PLT 248 03/26/2013   Lab Results  Component Value Date   CREATININE 0.92 03/26/2013   BUN 14 03/26/2013   NA 143 03/26/2013   K 4.4 03/26/2013   CL 105 03/26/2013   CO2 29 03/26/2013   Lab Results  Component Value Date   ALT 17 03/26/2013   AST 18 03/26/2013   ALKPHOS 53 03/26/2013   BILITOT 0.4 03/26/2013   Lab Results  Component Value Date   CHOL 144 03/26/2013   Lab Results  Component Value Date   HDL 96 03/26/2013   Lab Results  Component Value Date   LDLCALC 43 03/26/2013   Lab Results  Component Value Date   TRIG 26 03/26/2013   Lab Results  Component Value Date   CHOLHDL 1.5 03/26/2013     Assessment & Plan  GERD Avoid offending foods, start probiotics. Do not eat large meals in late evening and consider raising head of bed. Continues Digestive Advantage, avoids dairy  Allergic state Increase Cetirizine to bid, use Flonase daily, nasal saline twice daily and if not enough improvement then restart Montelukast/Singulair 10 mg daily  UTI (lower urinary tract infection) No recent flares  Pain in ear Follows with Dr Janace Hoard, has had tubes placed in b/l ears. Had some bleeding in left when tube was initially placed no recurrence, right ear feels stopped up since coming home from a cruise 2 weeks ago, did have to fly  Elevated blood pressure Well controlled, no changes to meds.  Encouraged heart healthy diet such as the DASH diet and exercise as tolerated.

## 2014-03-08 ENCOUNTER — Other Ambulatory Visit (INDEPENDENT_AMBULATORY_CARE_PROVIDER_SITE_OTHER): Payer: Medicare Other

## 2014-03-08 ENCOUNTER — Ambulatory Visit (INDEPENDENT_AMBULATORY_CARE_PROVIDER_SITE_OTHER): Payer: Medicare Other | Admitting: Internal Medicine

## 2014-03-08 ENCOUNTER — Encounter: Payer: Self-pay | Admitting: Internal Medicine

## 2014-03-08 VITALS — BP 118/70 | HR 96 | Ht 64.75 in | Wt 124.4 lb

## 2014-03-08 DIAGNOSIS — K859 Acute pancreatitis, unspecified: Secondary | ICD-10-CM

## 2014-03-08 DIAGNOSIS — K589 Irritable bowel syndrome without diarrhea: Secondary | ICD-10-CM

## 2014-03-08 DIAGNOSIS — K861 Other chronic pancreatitis: Secondary | ICD-10-CM

## 2014-03-08 DIAGNOSIS — K868 Other specified diseases of pancreas: Secondary | ICD-10-CM

## 2014-03-08 DIAGNOSIS — K858 Other acute pancreatitis without necrosis or infection: Secondary | ICD-10-CM

## 2014-03-08 DIAGNOSIS — R194 Change in bowel habit: Secondary | ICD-10-CM

## 2014-03-08 LAB — CBC WITH DIFFERENTIAL/PLATELET
BASOS PCT: 0.5 % (ref 0.0–3.0)
Basophils Absolute: 0 10*3/uL (ref 0.0–0.1)
Eosinophils Absolute: 0.1 10*3/uL (ref 0.0–0.7)
Eosinophils Relative: 1.5 % (ref 0.0–5.0)
HEMATOCRIT: 43.2 % (ref 36.0–46.0)
Hemoglobin: 14.2 g/dL (ref 12.0–15.0)
LYMPHS ABS: 2 10*3/uL (ref 0.7–4.0)
Lymphocytes Relative: 27.1 % (ref 12.0–46.0)
MCHC: 33 g/dL (ref 30.0–36.0)
MCV: 94.2 fl (ref 78.0–100.0)
MONO ABS: 0.7 10*3/uL (ref 0.1–1.0)
Monocytes Relative: 9.1 % (ref 3.0–12.0)
Neutro Abs: 4.6 10*3/uL (ref 1.4–7.7)
Neutrophils Relative %: 61.8 % (ref 43.0–77.0)
PLATELETS: 248 10*3/uL (ref 150.0–400.0)
RBC: 4.59 Mil/uL (ref 3.87–5.11)
RDW: 13.8 % (ref 11.5–15.5)
WBC: 7.5 10*3/uL (ref 4.0–10.5)

## 2014-03-08 LAB — LIPASE: LIPASE: 67 U/L — AB (ref 11.0–59.0)

## 2014-03-08 LAB — COMPREHENSIVE METABOLIC PANEL
ALK PHOS: 51 U/L (ref 39–117)
ALT: 22 U/L (ref 0–35)
AST: 23 U/L (ref 0–37)
Albumin: 3.6 g/dL (ref 3.5–5.2)
BILIRUBIN TOTAL: 0.5 mg/dL (ref 0.2–1.2)
BUN: 13 mg/dL (ref 6–23)
CO2: 31 mEq/L (ref 19–32)
CREATININE: 0.9 mg/dL (ref 0.4–1.2)
Calcium: 9.2 mg/dL (ref 8.4–10.5)
Chloride: 104 mEq/L (ref 96–112)
GFR: 62.63 mL/min (ref >60.00)
Glucose, Bld: 79 mg/dL (ref 70–99)
Potassium: 4 mEq/L (ref 3.5–5.1)
SODIUM: 140 meq/L (ref 135–145)
Total Protein: 7 g/dL (ref 6.0–8.3)

## 2014-03-08 MED ORDER — DIPHENOXYLATE-ATROPINE 2.5-0.025 MG PO TABS: 1.0000 | ORAL_TABLET | Freq: Every day | ORAL | Status: DC

## 2014-03-08 NOTE — Progress Notes (Signed)
Subjective:    Patient ID: [name], female    DOB: 1941-04-11, 73 y.o.   MRN: [id number]  HPI [name] is a 73 year old female with autoimmune chronic pancreatitis, IBS, GERD, hemorrhoids, osteopenia who is seen in followup. She was previously followed and managed by Dr. Sharlett Iles, and he last saw her on 12/30/2011. She has a history of autoimmune pancreatitis and took steroids for some time. She also had elevated IgG subclass 4. She has been maintained on Creon 6 tablets with each meal but has been off steroids for several years. She reports overall she feels well, though occasionally she will have flares of pancreatitis. For her this is mid epigastric abdominal pain that can radiate to the back. Episodes last one to 2 days and during this period she has trouble eating and abdominal pain. During this period she often just drinks liquids. Her last flare was approximately 2-3 months ago. Her weight currently has been stable around 124 though she has been as low as 112. She reports ideal for her is 125 pounds. She is having issues with alternating diarrhea and constipation which she relates to IBS. She states this is very long-standing. She denies blood in her stool or melena. She often has a cycle of 2-3 days of loose stools followed by every 3 days of what she considers constipation. In the past she has tried cholestyramine which resolved the diarrhea but resulted in constipation so it was discontinued. She also tried Benefiber and other forms of upper supplement which he found a cause bloating. She avoids lactose which she says has helped and also tries to eat non-GMO foods.  Previously she took Viokase which she said worked much better than her Creon because it seemed to result in better overall bowel function and less pancreatitis flares. She has no alcohol use or history of chronic alcohol use, tobacco use or NSAID use. She is status post cholecystectomy.   Review of Systems As per HPI,  otherwise negative  Current Medications, Allergies, Past Medical History, Past Surgical History, Family History and Social History were reviewed in [employer] record.      Objective:   Physical Exam BP 118/70  Pulse 96  Ht 5' 4.75" (1.645 m)  Wt 124 lb 6 oz (56.416 kg)  BMI 20.85 kg/m2 Constitutional: Well-developed and well-nourished. No distress. HEENT: Normocephalic and atraumatic. Oropharynx is clear and moist. No oropharyngeal exudate. Conjunctivae are normal.  No scleral icterus. Neck: Neck supple. Trachea midline. Cardiovascular: Normal rate, regular rhythm and intact distal pulses. No M/R/G Pulmonary/chest: Effort normal and breath sounds normal. No wheezing, rales or rhonchi. Abdominal: Soft, nontender, nondistended. Bowel sounds active throughout.  Extremities: no clubbing, cyanosis, or edema Neurological: Alert and oriented to person place and time. Skin: Skin is warm and dry. No rashes noted. Psychiatric: Normal mood and affect. Behavior is normal.  CBC    Component Value Date/Time   WBC 7.5 03/08/2014 1553   RBC 4.59 03/08/2014 1553   HGB 14.2 03/08/2014 1553   HCT 43.2 03/08/2014 1553   PLT 248.0 03/08/2014 1553   MCV 94.2 03/08/2014 1553   MCH 31.1 03/26/2013 0931   MCHC 33.0 03/08/2014 1553   RDW 13.8 03/08/2014 1553   LYMPHSABS 2.0 03/08/2014 1553   MONOABS 0.7 03/08/2014 1553   EOSABS 0.1 03/08/2014 1553   BASOSABS 0.0 03/08/2014 1553    CMP     Component Value Date/Time   NA 140 03/08/2014 1553   K 4.0 03/08/2014  1553   CL 104 03/08/2014 1553   CO2 31 03/08/2014 1553   GLUCOSE 79 03/08/2014 1553   BUN 13 03/08/2014 1553   CREATININE 0.9 03/08/2014 1553   CREATININE 0.92 03/26/2013 0931   CALCIUM 9.2 03/08/2014 1553   PROT 7.0 03/08/2014 1553   ALBUMIN 3.6 03/08/2014 1553   AST 23 03/08/2014 1553   ALT 22 03/08/2014 1553   ALKPHOS 51 03/08/2014 1553   BILITOT 0.5 03/08/2014 1553   GFRNONAA 81.18 03/12/2010 0848    GFRAA  Value: >60        The eGFR has been calculated using the MDRD equation. This calculation has not been validated in all clinical situations. eGFR's persistently <60 mL/min signify possible Chronic Kidney Disease. 12/05/2008 0645    Lipase     Component Value Date/Time   LIPASE 67.0* 03/08/2014 1553   Colonoscopy -- 01/27/2008 -- tortuous with internal hemorrhoids, no polyps  EUS 2010 -- reviewed    Assessment & Plan:   73 year old female with autoimmune chronic pancreatitis, IBS, GERD, hemorrhoids, osteopenia who is seen in followup.   1. Autoimmune pancreatitis -- long history responsive to steroids in the past with elevated IgG subclass 4. She is having intermittent episodes of pancreatitis though they are short lived and fairly uncommon. I have recommended repeat cross-sectional imaging with MRI with contrast to better evaluate the pancreas and rule out pancreatic mass or lesion. I recommended she continue Creon 6 capsules before meals. Labs checked today show normal CBC, CMP and slight elevation in lipase. Will await imaging. There is no evidence of malabsorption.  2. IBS with alternating diarrhea and constipation -- certainly there may be some element of pancreatic insufficiency despite the use of Creon contributing to loose stools though she does have long-standing IBS. I recommended low dose Benefiel one half to one tablespoon daily to try to help bulk stools and prevent diarrhea and constipation. If after an adequate trial this is unhelpful I would like her to use Lomotil one tablet on days when she is having diarrhea. If this causes subsequent constipation I asked that she notify me and she voices understanding. In the past she tried the FODMAP diet, but misplaced it.  Another copy was given and she will try to adhere to this diet as well.    Return in 8 weeks, sooner if necessary

## 2014-03-08 NOTE — Patient Instructions (Addendum)
You have been scheduled for an MRI at  [facility] on . Your appointment time is 03/21/2014 at 9:15am   . Please arrive 15 minutes prior to your appointment time for registration purposes. Please make certain not to have anything to eat or drink 6 hours prior to your test. In addition, if you have any metal in your body, have a pacemaker or defibrillator, please be sure to let your ordering physician know. This test typically takes 45 minutes to 1 hour to complete.  Go to the basement for labs Use Benefiber daily 1-11/2 tablespoons daily  We have printed your prescription Lomotil for you use only if Benifiber does not work

## 2014-03-09 ENCOUNTER — Other Ambulatory Visit: Payer: Medicare Other

## 2014-03-11 ENCOUNTER — Other Ambulatory Visit: Payer: Self-pay

## 2014-03-21 ENCOUNTER — Inpatient Hospital Stay: Admission: RE | Admit: 2014-03-21 | Payer: Medicare Other | Source: Ambulatory Visit

## 2014-03-26 ENCOUNTER — Other Ambulatory Visit: Payer: Medicare Other

## 2014-03-27 ENCOUNTER — Inpatient Hospital Stay: Admission: RE | Admit: 2014-03-27 | Payer: Medicare Other | Source: Ambulatory Visit

## 2014-04-03 ENCOUNTER — Inpatient Hospital Stay: Admission: RE | Admit: 2014-04-03 | Payer: Medicare Other | Source: Ambulatory Visit

## 2014-04-10 ENCOUNTER — Inpatient Hospital Stay: Admission: RE | Admit: 2014-04-10 | Payer: Medicare Other | Source: Ambulatory Visit

## 2014-04-14 ENCOUNTER — Other Ambulatory Visit: Payer: Self-pay

## 2014-04-14 ENCOUNTER — Ambulatory Visit (INDEPENDENT_AMBULATORY_CARE_PROVIDER_SITE_OTHER): Payer: Medicare Other

## 2014-04-14 ENCOUNTER — Telehealth: Payer: Self-pay

## 2014-04-14 DIAGNOSIS — Z23 Encounter for immunization: Secondary | ICD-10-CM

## 2014-04-14 DIAGNOSIS — K861 Other chronic pancreatitis: Secondary | ICD-10-CM

## 2014-04-14 NOTE — Progress Notes (Signed)
Pt tolerated injection well.  No signs of reaction upon leaving the clinic.   

## 2014-04-14 NOTE — Progress Notes (Signed)
Pre visit review using our clinic review tool, if applicable. No additional management support is needed unless otherwise documented below in the visit note. 

## 2014-04-14 NOTE — Telephone Encounter (Signed)
Insurance co will not approve pt for MRI of abdomen. Per Dr. Hilarie Fredrickson pt can have CT of abd/pancreatic protocol done. This test should provide him with the information needed he was just trying to avoid the radiation exposure. Pt scheduled for CT of abdomen with and without at [facility] CT 04/18/14@9 :30am. Pt to arrive there at 9am to drink water contrast. Pt to be NPO after 7am. Left message for pt to call back.  Spoke with pt and she is aware.

## 2014-04-18 ENCOUNTER — Ambulatory Visit (INDEPENDENT_AMBULATORY_CARE_PROVIDER_SITE_OTHER)
Admission: RE | Admit: 2014-04-18 | Discharge: 2014-04-18 | Disposition: A | Discharge status: Home / Self Care | Payer: Medicare Other | Source: Ambulatory Visit | Attending: Internal Medicine | Admitting: Internal Medicine

## 2014-04-18 DIAGNOSIS — K861 Other chronic pancreatitis: Secondary | ICD-10-CM

## 2014-04-18 DIAGNOSIS — K868 Other specified diseases of pancreas: Secondary | ICD-10-CM

## 2014-04-18 MED ORDER — IOHEXOL 300 MG/ML  SOLN
100.0000 mL | Freq: Once | INTRAMUSCULAR | Status: AC | PRN
  Administered 2014-04-18: 100 mL via INTRAVENOUS

## 2014-04-19 ENCOUNTER — Other Ambulatory Visit: Payer: Self-pay | Admitting: Family Medicine

## 2014-04-19 NOTE — Telephone Encounter (Signed)
Rx sent to the pharmacy by e-script.//AB/CMA 

## 2014-05-31 ENCOUNTER — Encounter: Payer: Self-pay | Admitting: Family Medicine

## 2014-06-06 ENCOUNTER — Encounter: Payer: Self-pay | Admitting: *Deleted

## 2014-06-15 ENCOUNTER — Encounter: Payer: Self-pay | Admitting: Internal Medicine

## 2014-06-15 ENCOUNTER — Ambulatory Visit (INDEPENDENT_AMBULATORY_CARE_PROVIDER_SITE_OTHER): Payer: 59 | Admitting: Internal Medicine

## 2014-06-15 VITALS — BP 128/74 | HR 88 | Ht 64.75 in | Wt 124.4 lb

## 2014-06-15 DIAGNOSIS — R151 Fecal smearing: Secondary | ICD-10-CM

## 2014-06-15 DIAGNOSIS — K648 Other hemorrhoids: Secondary | ICD-10-CM

## 2014-06-15 DIAGNOSIS — K589 Irritable bowel syndrome without diarrhea: Secondary | ICD-10-CM

## 2014-06-15 DIAGNOSIS — K868 Other specified diseases of pancreas: Secondary | ICD-10-CM

## 2014-06-15 DIAGNOSIS — K861 Other chronic pancreatitis: Secondary | ICD-10-CM

## 2014-06-15 MED ORDER — PANCRELIPASE (LIP-PROT-AMYL) 24000-76000 UNITS PO CPEP: ORAL_CAPSULE | ORAL | Status: DC

## 2014-06-15 NOTE — Patient Instructions (Signed)
You have been scheduled for your first hemorrhoidal banding on Tuesday 07/19/14 @ 4:00 pm.  We have sent the following prescriptions to your mail in pharmacy: Creon-Take 6 capsules with meals and 1-2 capsules with each snack   If you have not heard from your mail in pharmacy within 1 week or if you have not received your medication in the mail, please contact us at [phone] so we may find out why.  Continue FODMAP diet.  Please follow up in 6 months with Dr Hilarie Fredrickson.  CC:Dr Charlett Blake

## 2014-06-15 NOTE — Progress Notes (Signed)
Subjective:    Patient ID: [name], female    DOB: 10-07-1940, 74 y.o.   MRN: [id number]  HPI [name] is a 74 year old female with autoimmune chronic pancreatitis, IBS, GERD, hemorrhoids and osteopenia who seen in follow-up. She was initially seen in October 2015 after being followed by Dr. Sharlett Iles. After her last visit she was continued on Creon and the FODMAP diet was recommended due to some of her irritable bowel symptoms. She suffered had a CT scan of the abdomen pelvis performed given her history of chronic pancreatitis and we reviewed these findings today.  She reports she has been doing well. She still has intermittent issues with urgent bowel movements. This is somewhat unpredictable and doesn't always occur with each meal. No blood in her stool or melena. Hemorrhoids do bother her quite frequently. She reports prolapsing tissue which has to be reduced on occasion. She works out 3-4 days per week at [employer] using a stationary bike. Hemorrhoids bother her after exercising. She does occasionally see blood with wiping. The FODMAP diet she is found quite helpful but can be difficult to follow. She is using Creon 6 tablets with meals. She is not currently using Creon with snacks. No flare of pancreatitis type pain since last being seen here.  S/p holecystectomy  Review of Systems As per HPI, otherwise negative  Current Medications, Allergies, Past Medical History, Past Surgical History, Family History and Social History were reviewed in [employer] record.     Objective:   Physical Exam BP 128/74 mmHg  Pulse 88  Ht 5' 4.75" (1.645 m)  Wt 124 lb 6 oz (56.416 kg)  BMI 20.85 kg/m2 Constitutional: Well-developed and well-nourished. No distress. HEENT: Normocephalic and atraumatic. Oropharynx is clear and moist. No oropharyngeal exudate. Conjunctivae are normal.  No scleral icterus. Neck: Neck supple. Trachea midline. Cardiovascular: Normal rate, regular  rhythm and intact distal pulses. No M/R/G Pulmonary/chest: Effort normal and breath sounds normal. No wheezing, rales or rhonchi. Abdominal: Soft, nontender, nondistended. Bowel sounds active throughout. There are no masses palpable. No hepatosplenomegaly. Extremities: no clubbing, cyanosis, or edema Lymphadenopathy: No cervical adenopathy noted. Neurological: Alert and oriented to person place and time. Skin: Skin is warm and dry. No rashes noted. Psychiatric: Normal mood and affect. Behavior is normal.  CBC    Component Value Date/Time   WBC 7.5 03/08/2014 1553   RBC 4.59 03/08/2014 1553   HGB 14.2 03/08/2014 1553   HCT 43.2 03/08/2014 1553   PLT 248.0 03/08/2014 1553   MCV 94.2 03/08/2014 1553   MCH 31.1 03/26/2013 0931   MCHC 33.0 03/08/2014 1553   RDW 13.8 03/08/2014 1553   LYMPHSABS 2.0 03/08/2014 1553   MONOABS 0.7 03/08/2014 1553   EOSABS 0.1 03/08/2014 1553   BASOSABS 0.0 03/08/2014 1553    CMP     Component Value Date/Time   NA 140 03/08/2014 1553   K 4.0 03/08/2014 1553   CL 104 03/08/2014 1553   CO2 31 03/08/2014 1553   GLUCOSE 79 03/08/2014 1553   BUN 13 03/08/2014 1553   CREATININE 0.9 03/08/2014 1553   CREATININE 0.92 03/26/2013 0931   CALCIUM 9.2 03/08/2014 1553   PROT 7.0 03/08/2014 1553   ALBUMIN 3.6 03/08/2014 1553   AST 23 03/08/2014 1553   ALT 22 03/08/2014 1553   ALKPHOS 51 03/08/2014 1553   BILITOT 0.5 03/08/2014 1553   GFRNONAA 81.18 03/12/2010 0848   GFRAA  12/05/2008 0645    >60  The eGFR has been calculated using the MDRD equation. This calculation has not been validated in all clinical situations. eGFR's persistently <60 mL/min signify possible Chronic Kidney Disease.    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   EXAM: CT ABDOMEN WITHOUT AND WITH CONTRAST   TECHNIQUE: Multidetector CT imaging of the abdomen was performed following the standard protocol before and following the  bolus administration of intravenous contrast.   CONTRAST:  137m OMNIPAQUE IOHEXOL 300 MG/ML  SOLN   COMPARISON:  12/14/2008   FINDINGS: Lower chest: 3 mm pulmonary nodules identified in the right lower lobe lung images 3 and 4 of series 7. These are unchanged in the interval, consistent with a benign process such as scarring.   Hepatobiliary: 14 mm well-defined water density lesion in the inferior right liver was 12 mm previously. This is compatible with a small cyst. Usually does not now by CT. Several tiny a subcapsular hypervascular foci are seen on on cereal phase imaging. Most of these were present on the previous study and are unchanged in the interval, likely representing tiny foci of transient hepatic attenuation difference, potentially from etiology such is vascular malformation. Tiny probable cyst towards the dome of the liver (image 10 series 4) is unchanged. Small area of low attenuation in the anterior liver, adjacent to the falciform ligament, is in a characteristic location for focal fatty change.   Gallbladder is surgically absent. No intrahepatic or extrahepatic biliary dilation.   Pancreas: No wall hyper enhancing lesion within the pancreatic parenchyma. No cystic change within the pancreas. The main pancreatic duct remains slightly prominent throughout measuring 2-3 mm in diameter, but this is stable compared to the study from more than 5 years ago.   Spleen: No splenomegaly. No focal mass lesion.   Adrenals/Urinary Tract: No adrenal nodule or mass. The left kidney has 4 hyper attenuating lesions within the cortex on pre contrast imaging. These were all present on the previous study is well. The largest lesion measures 9 mm on today's study compared to mm on the previous exam. After IV contrast administration, several other tiny cysts are seen in the left kidney, the largest of which is in the lower pole it measures 10 mm today compared to 9 mm  previously. There is a 2-3 mm nonobstructing stone in the interpolar right kidney. No evidence for hydronephrosis or hydroureter on either side.   Stomach/Bowel: Stomach is nondistended. No gastric wall thickening. No evidence of outlet obstruction. Duodenum is normally positioned as is the ligament of Treitz. No small bowel wall thickening. No small bowel dilatation.   Vascular/Lymphatic: No abdominal aortic aneurysm. No gastrohepatic or hepatoduodenal ligament lymphadenopathy. No evidence for retroperitoneal lymphadenopathy.   Other: No intraperitoneal free fluid.   Musculoskeletal: Bone windows reveal no worrisome lytic or sclerotic osseous lesions.   IMPRESSION: 1. Stable exam without new or progressive findings. Specifically, the pancreas shows no focal abnormality and the slight diffuse prominence of the main pancreatic duct is unchanged. 2. Several hyper attenuating lesions in the left kidney are not substantially changed since the prior study. Given the more than 5 years of imaging stability, these are likely related to chronic proteinaceous or hemorrhagic cysts. Other fluid attenuating cysts are seen in the left kidney. 3. Tiny nonobstructing right renal stone. 4. Several hepatic cysts with tiny subcapsular hypervascular foci in the liver, most of which were present previously. These most likely represent tiny areas of vascular malformation or other benign etiology. 5. Tiny right  lower lobe pulmonary nodules consistent with scarring.     Electronically Signed   By: Misty Stanley M.D.   On: 04/18/2014 11:48    Colonoscopy 01/27/2008, torturous with internal hemorrhoids. No polyps     Assessment & Plan:  74 year old female with autoimmune chronic pancreatitis, IBS, GERD, hemorrhoids and osteopenia who seen in follow-up.   1. AI pancreatitis -- long-standing history of autoimmune pancreatitis without recent flare. We have reviewed her cross-sectional imaging  together and it shows stable pancreatic changes without progression. No concerning lesions. We will continue Creon 6 tablets before meals and I will add 2 tablets before snacks. Some of her urgent stools occur with snacks and it may be due to lack of pancreatic enzymes. There is no evidence of malnutrition. Notify me of any abdominal pain or flare. She voices understanding  2. IBS with alternating diarrhea and constipation -- improved with dietary change. Continue FODMAP diet.  We are also making changes to Creon. Stable without alarm symptoms  3.  Internal hemorrhoids -- grade 3 by history. We discussed hemorrhoidal banding and she is very interested and would like to proceed. Appointment arranged  Return in 6 months, sooner if necessary

## 2014-07-04 ENCOUNTER — Ambulatory Visit: Payer: Medicare Other | Admitting: Internal Medicine

## 2014-07-05 ENCOUNTER — Encounter: Payer: Self-pay | Admitting: *Deleted

## 2014-07-19 ENCOUNTER — Encounter: Payer: Self-pay | Admitting: Internal Medicine

## 2014-07-19 ENCOUNTER — Ambulatory Visit (INDEPENDENT_AMBULATORY_CARE_PROVIDER_SITE_OTHER): Payer: 59 | Admitting: Internal Medicine

## 2014-07-19 VITALS — BP 120/70 | HR 80 | Ht 65.0 in | Wt 122.0 lb

## 2014-07-19 DIAGNOSIS — K648 Other hemorrhoids: Secondary | ICD-10-CM

## 2014-07-19 DIAGNOSIS — R21 Rash and other nonspecific skin eruption: Secondary | ICD-10-CM

## 2014-07-19 NOTE — Patient Instructions (Addendum)
You have been scheduled for your 2nd hemorrhoidal banding with Dr Hilarie Fredrickson on Thursday, 09/08/14 @ 1:30 pm.  You have been scheduled for your 3rd hemorrhoidal banding with Dr Hilarie Fredrickson on Thursday, 09/29/14 @ 4:00 pm.  Please go to the "foot" section at the store and pick up Lotromin cream. Apply to the rectal area (NOT internally) 2-3 times daily for yeast.  Please stop using witch hazel, pads and other topical medications.  Avoid over hygiene. HEMORRHOID BANDING PROCEDURE    FOLLOW-UP CARE   1. The procedure you have had should have been relatively painless since the banding of the area involved does not have nerve endings and there is no pain sensation.  The rubber band cuts off the blood supply to the hemorrhoid and the band may fall off as soon as 48 hours after the banding (the band may occasionally be seen in the toilet bowl following a bowel movement). You may notice a temporary feeling of fullness in the rectum which should respond adequately to plain Tylenol or Motrin.  2. Following the banding, avoid strenuous exercise that evening and resume full activity the next day.  A sitz bath (soaking in a warm tub) or bidet is soothing, and can be useful for cleansing the area after bowel movements.     3. To avoid constipation, take two tablespoons of natural wheat bran, natural oat bran, flax, Benefiber or any over the counter fiber supplement and increase your water intake to 7-8 glasses daily.    4. Unless you have been prescribed anorectal medication, do not put anything inside your rectum for two weeks: No suppositories, enemas, fingers, etc.  5. Occasionally, you may have more bleeding than usual after the banding procedure.  This is often from the untreated hemorrhoids rather than the treated one.  Don't be concerned if there is a tablespoon or so of blood.  If there is more blood than this, lie flat with your bottom higher than your head and apply an ice pack to the area. If the bleeding  does not stop within a half an hour or if you feel faint, call our office at [phone] or go to the emergency room.  6. Problems are not common; however, if there is a substantial amount of bleeding, severe pain, chills, fever or difficulty passing urine (very rare) or other problems, you should call us at [phone] or report to the nearest emergency room.  7. Do not stay seated continuously for more than 2-3 hours for a day or two after the procedure.  Tighten your buttock muscles 10-15 times every two hours and take 10-15 deep breaths every 1-2 hours.  Do not spend more than a few minutes on the toilet if you cannot empty your bowel; instead re-visit the toilet at a later time.

## 2014-07-21 NOTE — Progress Notes (Signed)
Patient ID: [name], female   DOB: 1940-08-23, 74 y.o.   MRN: [id number]  Banding #1  --Patient also occasionally having urgent loose stools after eating despite using Creon with tablet since [place]. She does think adding Creon before snacks has help with diarrhea overall. She has not used Lomotil. --She does continue to have intermittent bleeding, bright red with passing stool. She denies pain is a stool passes. She also reports hemorrhoids prolapse may need to be manually often.  PROCEDURE NOTE: The patient presents with symptomatic grade 3 internal hemorrhoids, requesting rubber band ligation of her hemorrhoidal disease.  All risks, benefits and alternative forms of therapy were described and informed consent was obtained.  In the Left Lateral Decubitus position anoscopic examination revealed grade 2-3 internal hemorrhoids in the RA>LL position(s), no masses, intact tone, perianal erythema and atrophy .  The anorectum was pre-medicated with NTG 0.125% ointment The decision was made to band the RA internal hemorrhoid, and the [employer] was used to perform band ligation without complication.  Digital anorectal examination was then performed to assure proper positioning of the band, and to adjust the banded tissue as required.  The patient was discharged home without pain or other issues.  Dietary and behavioral recommendations were given and along with follow-up instructions.     The following adjunctive treatments were recommended: Lotrisone 3 times daily for 10 days for probable perianal tinea Stop Tucks medicated pads and other astringents which can be drying to the perianal skin Balenol after BMs Continue Creon as prescribed Lomotil can be used 4 times daily as needed to decrease diarrhea  The patient will return in 2-4 weeks for  follow-up and possible additional banding as required. No complications were encountered and the patient tolerated the procedure well.

## 2014-07-25 ENCOUNTER — Encounter: Payer: Self-pay | Admitting: Internal Medicine

## 2014-07-25 ENCOUNTER — Ambulatory Visit (INDEPENDENT_AMBULATORY_CARE_PROVIDER_SITE_OTHER): Payer: 59 | Admitting: Internal Medicine

## 2014-07-25 VITALS — BP 137/81 | HR 77 | Ht 65.0 in | Wt 124.4 lb

## 2014-07-25 DIAGNOSIS — I471 Supraventricular tachycardia: Secondary | ICD-10-CM

## 2014-07-25 NOTE — Patient Instructions (Signed)
Your physician recommends that you continue on your current medications as directed. Please refer to the Current Medication list given to you today.  Your physician wants you to follow-up in: 1 year with Dr. Klein.  You will receive a reminder letter in the mail two months in advance. If you don't receive a letter, please call our office to schedule the follow-up appointment.  

## 2014-07-25 NOTE — Progress Notes (Signed)
Patient Care Team: Mosie Lukes, MD as PCP - General (Family Medicine) Deboraha Sprang, MD as Consulting Physician (Cardiology) Sable Feil, MD as Consulting Physician (Gastroenterology) Verdis Frederickson, MD as Consulting Physician (Obstetrics and Gynecology)   HPI  [name] is a 74 y.o. female Seen in followup. She has a history of recurrent chest pain. Myoview scanning 2010 was normal. Because of recurrent chest pain, 2012 she underwent catheterization that was normal  She has had some problems with orthostatic intolerance which is worse in the summer. She tries to maintain volume repletion  She and her husband travel extensively  She has occasional tachypalpitations.       Past Medical History  Diagnosis Date  . Pancreatitis chronic   . Esophageal reflux   . Internal hemorrhoids without mention of complication   . Osteoarthritis   . ANXIETY, CHRONIC   . CHEST PAIN     LHC 02/2011: normal cors, EF 65%  . HYPOTENSION, ORTHOSTATIC   . IBS (irritable bowel syndrome)   . PALPITATIONS, RECURRENT   . PSVT   . SHOULDER PAIN   . VITAMIN D DEFICIENCY   . TOBACCO USE, QUIT   . Fibrocystic breast   . 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. Osteopenia   . Allergic state   . Osteopenia   . Pain in ear   . Preventative health care   . Kidney lesion   . Hepatic cyst   . Diverticulosis   . Liver cyst     Past Surgical History  Procedure Laterality Date  . Abdominal hysterectomy  1998  . Cholecystectomy  1999  . Knee surgery  1958  . Tubal ligation      with D&C  . Breast surgery      breast bx on left, benign  . Cardiac catheterization      Current Outpatient Prescriptions  Medication Sig Dispense Refill  . Ascorbic Acid (VITAMIN C) 500 MG tablet Take 500 mg by mouth daily.      Marland Kitchen aspirin 81 MG tablet Take 81 mg by mouth 2 (two) times daily.      . cetirizine (ZYRTEC) 10 MG tablet Take 10 mg by mouth daily.      . Cholecalciferol (VITAMIN D3) 1000  UNITS CAPS Take 1 tablet by mouth daily.      Marland Kitchen CINNAMON PO Take by mouth daily.    . Cranberry 500 MG CAPS Take 1 capsule by mouth daily.    . diphenoxylate-atropine (LOMOTIL) 2.5-0.025 MG per tablet Take 1 tablet by mouth daily. 60 tablet 0  . escitalopram (LEXAPRO) 10 MG tablet Take 1 tablet (10 mg total) by mouth daily. 90 tablet 3  . estradiol (VIVELLE-DOT) 0.025 MG/24HR Place 1 patch onto the skin 2 (two) times a week. Patient aware the risks and benefits of taking this medicine 24 patch 3  . fluticasone (FLONASE) 50 MCG/ACT nasal spray Place 2 sprays into the nose as needed for rhinitis or allergies. 16 g 5  . METROGEL 1 % gel BID times 48H.    Marland Kitchen Pancrelipase, Lip-Prot-Amyl, (CREON) 24000 UNITS CPEP Take 6 capsules by mouth with each meal and 1-2 capsules with each snack 2000 capsule 1  . pantoprazole (PROTONIX) 40 MG tablet Take 1 tablet by mouth two  times daily 180 tablet 1  . Probiotic Product (PROBIOTIC PO) Take by mouth. Takes Probaclac probiotic once daily      No current facility-administered medications for this visit.    Allergies  Allergen Reactions  . Amoxicillin     REACTION: unspecified  . Azithromycin     REACTION: Rash  . Ciprofloxacin     REACTION: GI upset  . Epinephrine     REACTION: unspecified  . Penicillins     REACTION: rash  . Sulfonamide Derivatives     REACTION: sick to stomach    Review of Systems negative except from HPI and PMH  Physical Exam BP 137/81 mmHg  Pulse 77  Ht 5\' 5"  (1.651 m)  Wt 124 lb 6.4 oz (56.427 kg)  BMI 20.70 kg/m2 Well developed and nourished in no acute distress HENT normal Neck supple with JVP-flat Clear Regular rate and rhythm, no murmurs or gallops Abd-soft with active BS No Clubbing cyanosis edema Skin-warm and dry A & Oriented  Grossly normal sensory and motor function   ECG 69 13/09/41     Assessment and  Plan  Hypertension well controlled  Atrial Tach-- no significant recurrences  Orthostatic  intolerance  We discussed the importance of volume repletion esp in the summer; we reviewed the role of sugar based salt and flurid repletion  We spent more than 50% of our >25 min visit in face to face counseling regarding the above

## 2014-07-29 ENCOUNTER — Telehealth: Payer: Self-pay | Admitting: *Deleted

## 2014-07-29 ENCOUNTER — Encounter: Payer: Self-pay | Admitting: *Deleted

## 2014-07-29 NOTE — Telephone Encounter (Signed)
Pre-Visit Call completed with patient and chart updated.   Pre-Visit Info documented in Specialty Comments under [employer].    

## 2014-08-01 ENCOUNTER — Encounter: Payer: Self-pay | Admitting: Family Medicine

## 2014-08-01 ENCOUNTER — Ambulatory Visit (INDEPENDENT_AMBULATORY_CARE_PROVIDER_SITE_OTHER): Payer: 59 | Admitting: Family Medicine

## 2014-08-01 VITALS — BP 134/84 | HR 76 | Temp 97.7°F | Ht 65.0 in | Wt 123.4 lb

## 2014-08-01 DIAGNOSIS — K649 Unspecified hemorrhoids: Secondary | ICD-10-CM

## 2014-08-01 DIAGNOSIS — E559 Vitamin D deficiency, unspecified: Secondary | ICD-10-CM | POA: Diagnosis not present

## 2014-08-01 DIAGNOSIS — M8580 Other specified disorders of bone density and structure, unspecified site: Secondary | ICD-10-CM | POA: Diagnosis not present

## 2014-08-01 DIAGNOSIS — K861 Other chronic pancreatitis: Secondary | ICD-10-CM

## 2014-08-01 DIAGNOSIS — F419 Anxiety disorder, unspecified: Secondary | ICD-10-CM | POA: Diagnosis not present

## 2014-08-01 DIAGNOSIS — R002 Palpitations: Secondary | ICD-10-CM

## 2014-08-01 DIAGNOSIS — IMO0001 Reserved for inherently not codable concepts without codable children: Secondary | ICD-10-CM

## 2014-08-01 DIAGNOSIS — Z780 Asymptomatic menopausal state: Secondary | ICD-10-CM

## 2014-08-01 DIAGNOSIS — Z0000 Encounter for general adult medical examination without abnormal findings: Secondary | ICD-10-CM

## 2014-08-01 DIAGNOSIS — K589 Irritable bowel syndrome without diarrhea: Secondary | ICD-10-CM | POA: Diagnosis not present

## 2014-08-01 DIAGNOSIS — K868 Other specified diseases of pancreas: Secondary | ICD-10-CM

## 2014-08-01 DIAGNOSIS — R1013 Epigastric pain: Secondary | ICD-10-CM | POA: Diagnosis not present

## 2014-08-01 DIAGNOSIS — K219 Gastro-esophageal reflux disease without esophagitis: Secondary | ICD-10-CM

## 2014-08-01 DIAGNOSIS — R030 Elevated blood-pressure reading, without diagnosis of hypertension: Secondary | ICD-10-CM

## 2014-08-01 LAB — CBC
HEMATOCRIT: 43.1 % (ref 36.0–46.0)
HEMOGLOBIN: 14.6 g/dL (ref 12.0–15.0)
MCHC: 33.8 g/dL (ref 30.0–36.0)
MCV: 91.6 fl (ref 78.0–100.0)
Platelets: 275 10*3/uL (ref 150.0–400.0)
RBC: 4.7 Mil/uL (ref 3.87–5.11)
RDW: 14 % (ref 11.5–15.5)
WBC: 6 10*3/uL (ref 4.0–10.5)

## 2014-08-01 LAB — COMPREHENSIVE METABOLIC PANEL
ALBUMIN: 4.2 g/dL (ref 3.5–5.2)
ALT: 19 U/L (ref 0–35)
AST: 19 U/L (ref 0–37)
Alkaline Phosphatase: 53 U/L (ref 39–117)
BILIRUBIN TOTAL: 0.5 mg/dL (ref 0.2–1.2)
BUN: 12 mg/dL (ref 6–23)
CO2: 33 meq/L — AB (ref 19–32)
Calcium: 9.8 mg/dL (ref 8.4–10.5)
Chloride: 105 mEq/L (ref 96–112)
Creatinine, Ser: 0.94 mg/dL (ref 0.40–1.20)
GFR: 61.79 mL/min (ref >60.00)
GLUCOSE: 101 mg/dL — AB (ref 70–99)
Potassium: 4 mEq/L (ref 3.5–5.1)
Sodium: 141 mEq/L (ref 135–145)
Total Protein: 6.9 g/dL (ref 6.0–8.3)

## 2014-08-01 LAB — LIPASE: LIPASE: 60 U/L — AB (ref 11.0–59.0)

## 2014-08-01 LAB — AMYLASE: AMYLASE: 79 U/L (ref 27–131)

## 2014-08-01 LAB — TSH: TSH: 2.66 u[IU]/mL (ref 0.35–4.50)

## 2014-08-01 MED ORDER — RANITIDINE HCL 300 MG PO TABS: 300.0000 mg | ORAL_TABLET | Freq: Every day | ORAL | Status: DC

## 2014-08-01 NOTE — Progress Notes (Signed)
Pre visit review using our clinic review tool, if applicable. No additional management support is needed unless otherwise documented below in the visit note. 

## 2014-08-01 NOTE — Progress Notes (Signed)
[name]  [id number] [birth year] 08/01/2014      Progress Note-Follow Up  Subjective  Chief Complaint  Chief Complaint  Patient presents with  . Annual Exam    HPI  Patient is a 74 y.o. female in today for routine medical care. Patient is in today for annual exam. Overall doing well. Is following closely with gastroenterology for her pancreatitis and her hemorrhoids and is doing well. No recent illness. Has been trying to diminish her hormones and is noting some autonomic instability but finds it manageable. Does still have some diarrhea but it is less frequent. No bloody stool. Denies CP/palp/SOB/HA/congestion/fevers or GU c/o. Taking meds as prescribed  Past Medical History  Diagnosis Date  . Pancreatitis chronic   . Esophageal reflux   . Internal hemorrhoids without mention of complication   . Osteoarthritis   . ANXIETY, CHRONIC   . CHEST PAIN     LHC 02/2011: normal cors, EF 65%  . HYPOTENSION, ORTHOSTATIC   . IBS (irritable bowel syndrome)   . PALPITATIONS, RECURRENT   . PSVT   . SHOULDER PAIN   . VITAMIN D DEFICIENCY   . TOBACCO USE, QUIT   . Fibrocystic breast   . 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. Osteopenia   . Allergic state   . Osteopenia   . Pain in ear   . Preventative health care   . Kidney lesion   . Hepatic cyst   . Diverticulosis   . Liver cyst     Past Surgical History  Procedure Laterality Date  . Abdominal hysterectomy  1998  . Cholecystectomy  1999  . Knee surgery  1958  . Tubal ligation      with D&C  . Breast surgery      breast bx on left, benign  . Cardiac catheterization      Family History  Problem Relation Age of Onset  . Hypertension Mother   . Stroke Mother   . Cancer Father     bladder  . Heart disease Father   . Breast cancer Cousin   . Ovarian cancer Paternal Aunt   . Cancer Paternal Aunt     breast, ovarian, pancreatic, cervical  . Pancreatic cancer Paternal Aunt   . Stomach cancer Paternal Uncle   .  Cancer Paternal Uncle     lung, stomach  . Uterine cancer Paternal Grandmother   . Cancer Paternal Grandmother     gyn cancer  . Heart disease Sister   . Breast cancer Paternal Aunt   . Heart disease Maternal Grandmother   . Stroke Maternal Grandmother   . Heart disease Maternal Grandfather   . Stroke Maternal Grandfather     History   Social History  . Marital Status: Married    Spouse Name: N/A  . Number of Children: 1  . Years of Education: N/A   Occupational History  . Retired    Social History Main Topics  . Smoking status: Former Smoker    Types: Cigarettes    Quit date: 05/27/1973  . Smokeless tobacco: Never Used  . Alcohol Use: No  . Drug Use: No  . Sexual Activity: Yes   Other Topics Concern  . Not on file   Social History Narrative    Current Outpatient Prescriptions on File Prior to Visit  Medication Sig Dispense Refill  . Ascorbic Acid (VITAMIN C) 500 MG tablet Take 500 mg by mouth daily.      Marland Kitchen aspirin 81 MG tablet  Take 81 mg by mouth 2 (two) times daily.      . cetirizine (ZYRTEC) 10 MG tablet Take 10 mg by mouth daily.      . Cholecalciferol (VITAMIN D3) 1000 UNITS CAPS Take 1 tablet by mouth daily.      Marland Kitchen CINNAMON PO Take by mouth daily.    . Cranberry 500 MG CAPS Take 1 capsule by mouth daily.    . diphenoxylate-atropine (LOMOTIL) 2.5-0.025 MG per tablet Take 1 tablet by mouth daily. 60 tablet 0  . escitalopram (LEXAPRO) 10 MG tablet Take 1 tablet (10 mg total) by mouth daily. 90 tablet 3  . fluticasone (FLONASE) 50 MCG/ACT nasal spray Place 2 sprays into the nose as needed for rhinitis or allergies. 16 g 5  . METROGEL 1 % gel BID times 48H.    Marland Kitchen Pancrelipase, Lip-Prot-Amyl, (CREON) 24000 UNITS CPEP Take 6 capsules by mouth with each meal and 1-2 capsules with each snack 2000 capsule 1  . pantoprazole (PROTONIX) 40 MG tablet Take 1 tablet by mouth two  times daily 180 tablet 1  . Probiotic Product (PROBIOTIC PO) Take by mouth. Takes Probaclac  probiotic once daily      No current facility-administered medications on file prior to visit.    Allergies  Allergen Reactions  . Amoxicillin     REACTION: unspecified  . Azithromycin     REACTION: Rash  . Ciprofloxacin     REACTION: GI upset  . Epinephrine     REACTION: unspecified  . Penicillins     REACTION: rash  . Sulfonamide Derivatives     REACTION: sick to stomach    Review of Systems  Review of Systems  Constitutional: Negative for fever, chills and malaise/fatigue.  HENT: Negative for congestion, hearing loss and nosebleeds.   Eyes: Negative for discharge.  Respiratory: Negative for cough, sputum production, shortness of breath and wheezing.   Cardiovascular: Negative for chest pain, palpitations and leg swelling.  Gastrointestinal: Positive for heartburn and abdominal pain. Negative for nausea, vomiting, diarrhea, constipation and blood in stool.  Genitourinary: Negative for dysuria, urgency, frequency and hematuria.  Musculoskeletal: Negative for myalgias, back pain and falls.  Skin: Negative for rash.  Neurological: Negative for dizziness, tremors, sensory change, focal weakness, loss of consciousness, weakness and headaches.  Endo/Heme/Allergies: Negative for polydipsia. Does not bruise/bleed easily.  Psychiatric/Behavioral: Negative for depression and suicidal ideas. The patient is not nervous/anxious and does not have insomnia.    Report Pre-Visit Info 07/29/2014 5:04 PM   Medication: Reviewed with patient and updated  (all up to date, but patient not taking estrogen patch due to insurance/ cost issues)  Preferred Pharmacy and which med where: 90 day supply/mail order:  Local pharmacy:   Allergies verified: UTD  Immunization Status: Flu vaccine--04/04/14 Tdap--05/27/06 PNA--05/01/07 (unspecified) Shingles--09/01/07  A/P:  Changes to [place], [place] or Personal Hx: UTD  Pap-- 04/02/11- negative MMG--05/16/14 with Christene Slates at [place], BI-RADS 2:  Benign Bone Density--05/08/12- at [facility] with Emmit Pomfret, MD- Osteopenia (f/u 2 yrs) CCS--07/30/12- patient reported normal  Care Teams Updated: Rolm Bookbinder, MD- Dermatology  Alphonsa Overall, MD- [place] up on breast biopsy Virl Axe, MD- Cardiology   ED/Hospital/Urgent Care Visits: none  To Discuss with Provider: Patient is concerned that her Protonix can cause dementia. Patient says that she spoke with Alphonsa Overall who is doing her breast exams, and he states that if it is OK with you, you could do the exams. He says that she has very  dense tissue, possibly due to hormones, so he is happy to keep doing them if you would like.         Objective  BP 145/87 mmHg  Pulse 76  Temp(Src) 97.7 F (36.5 C) (Oral)  Ht 5\' 5"  (1.651 m)  Wt 123 lb 6 oz (55.963 kg)  BMI 20.53 kg/m2  SpO2 100%  Physical Exam  Physical Exam  Constitutional: She is oriented to person, place, and time and well-developed, well-nourished, and in no distress. No distress.  HENT:  Head: Normocephalic and atraumatic.  Right Ear: External ear normal.  Left Ear: External ear normal.  Nose: Nose normal.  Mouth/Throat: Oropharynx is clear and moist. No oropharyngeal exudate.  Eyes: Conjunctivae are normal. Pupils are equal, round, and reactive to light. Right eye exhibits no discharge. Left eye exhibits no discharge. No scleral icterus.  Neck: Normal range of motion. Neck supple. No thyromegaly present.  Cardiovascular: Normal rate, regular rhythm, normal heart sounds and intact distal pulses.   No murmur heard. Pulmonary/Chest: Effort normal and breath sounds normal. No respiratory distress. She has no wheezes. She has no rales.  Abdominal: Soft. Bowel sounds are normal. She exhibits no distension and no mass. There is no tenderness.  Musculoskeletal: Normal range of motion. She exhibits no edema or tenderness.  Lymphadenopathy:    She has no cervical adenopathy.  Neurological: She is  alert and oriented to person, place, and time. She has normal reflexes. No cranial nerve deficit. Coordination normal.  Skin: Skin is warm and dry. No rash noted. She is not diaphoretic.  Psychiatric: Mood, memory and affect normal.    Lab Results  Component Value Date   TSH 4.099 03/26/2013   Lab Results  Component Value Date   WBC 7.5 03/08/2014   HGB 14.2 03/08/2014   HCT 43.2 03/08/2014   MCV 94.2 03/08/2014   PLT 248.0 03/08/2014   Lab Results  Component Value Date   CREATININE 0.9 03/08/2014   BUN 13 03/08/2014   NA 140 03/08/2014   K 4.0 03/08/2014   CL 104 03/08/2014   CO2 31 03/08/2014   Lab Results  Component Value Date   ALT 22 03/08/2014   AST 23 03/08/2014   ALKPHOS 51 03/08/2014   BILITOT 0.5 03/08/2014   Lab Results  Component Value Date   CHOL 144 03/26/2013   Lab Results  Component Value Date   HDL 96 03/26/2013   Lab Results  Component Value Date   LDLCALC 43 03/26/2013   Lab Results  Component Value Date   TRIG 26 03/26/2013   Lab Results  Component Value Date   CHOLHDL 1.5 03/26/2013     Assessment & Plan  GERD Avoid offending foods, continue probiotics. Do not eat large meals in late evening and consider raising head of bed. Is doing better with dietary changes. Patient is interested in minimizing PPI is given a prescription for Ranitidine to alternate with her Protonix as tolerated.   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Following with gastroenterology and doing better at the present time.    Preventative health care Patient encouraged to maintain heart healthy diet, regular exercise, adequate sleep. Consider daily probiotics. Take medications as prescribed. Patient denies any difficulties at home. No trouble with ADLs, depression or falls. No recent changes to vision or hearing. Is UTD with immunizations. Is UTD with screening. Discussed Advanced Directives, patient agrees to bring [place] copies of documents if can. Encouraged heart  healthy diet, exercise as tolerated and adequate sleep.  Elevated blood pressure Well controlled. Encouraged heart healthy diet such as the DASH diet and exercise as tolerated.    Post-menopause Agrees to titrate off of her hormones completely   Hemorrhoids Has changed her management regimen per gastroenterologies directions and is doing better.    Osteopenia Recheck Bone Density.

## 2014-08-01 NOTE — Patient Instructions (Addendum)
Alternate the Ranitdine and the Protonix every other day and see how you do   Preventive Care for Adults A healthy lifestyle and preventive care can promote health and wellness. Preventive health guidelines for women include the following key practices.  A routine yearly physical is a good way to check with your health care provider about your health and preventive screening. It is a chance to share any concerns and updates on your health and to receive a thorough exam.  Visit your dentist for a routine exam and preventive care every 6 months. Brush your teeth twice a day and floss once a day. Good oral hygiene prevents tooth decay and gum disease.  The frequency of eye exams is based on your age, health, family medical history, use of contact lenses, and other factors. Follow your health care provider's recommendations for frequency of eye exams.  Eat a healthy diet. Foods like vegetables, fruits, whole grains, low-fat dairy products, and lean protein foods contain the nutrients you need without too many calories. Decrease your intake of foods high in solid fats, added sugars, and salt. Eat the right amount of calories for you.Get information about a proper diet from your health care provider, if necessary.  Regular physical exercise is one of the most important things you can do for your health. Most adults should get at least 150 minutes of moderate-intensity exercise (any activity that increases your heart rate and causes you to sweat) each week. In addition, most adults need muscle-strengthening exercises on 2 or more days a week.  Maintain a healthy weight. The body mass index (BMI) is a screening tool to identify possible weight problems. It provides an estimate of body fat based on height and weight. Your health care provider can find your BMI and can help you achieve or maintain a healthy weight.For adults 20 years and older:  A BMI below 18.5 is considered underweight.  A BMI of 18.5  to 24.9 is normal.  A BMI of 25 to 29.9 is considered overweight.  A BMI of 30 and above is considered obese.  Maintain normal blood lipids and cholesterol levels by exercising and minimizing your intake of saturated fat. Eat a balanced diet with plenty of fruit and vegetables. Blood tests for lipids and cholesterol should begin at age 46 and be repeated every 5 years. If your lipid or cholesterol levels are high, you are over 50, or you are at high risk for heart disease, you may need your cholesterol levels checked more frequently.Ongoing high lipid and cholesterol levels should be treated with medicines if diet and exercise are not working.  If you smoke, find out from your health care provider how to quit. If you do not use tobacco, do not start.  Lung cancer screening is recommended for adults aged 36-80 years who are at high risk for developing lung cancer because of a history of smoking. A yearly low-dose CT scan of the lungs is recommended for people who have at least a 30-pack-year history of smoking and are a current smoker or have quit within the past 15 years. A pack year of smoking is smoking an average of 1 pack of cigarettes a day for 1 year (for example: 1 pack a day for 30 years or 2 packs a day for 15 years). Yearly screening should continue until the smoker has stopped smoking for at least 15 years. Yearly screening should be stopped for people who develop a health problem that would prevent them from having  lung cancer treatment.  If you are pregnant, do not drink alcohol. If you are breastfeeding, be very cautious about drinking alcohol. If you are not pregnant and choose to drink alcohol, do not have more than 1 drink per day. One drink is considered to be 12 ounces (355 mL) of beer, 5 ounces (148 mL) of wine, or 1.5 ounces (44 mL) of liquor.  Avoid use of street drugs. Do not share needles with anyone. Ask for help if you need support or instructions about stopping the use of  drugs.  High blood pressure causes heart disease and increases the risk of stroke. Your blood pressure should be checked at least every 1 to 2 years. Ongoing high blood pressure should be treated with medicines if weight loss and exercise do not work.  If you are 74-38 years old, ask your health care provider if you should take aspirin to prevent strokes.  Diabetes screening involves taking a blood sample to check your fasting blood sugar level. This should be done once every 3 years, after age 69, if you are within normal weight and without risk factors for diabetes. Testing should be considered at a younger age or be carried out more frequently if you are overweight and have at least 1 risk factor for diabetes.  Breast cancer screening is essential preventive care for women. You should practice "breast self-awareness." This means understanding the normal appearance and feel of your breasts and may include breast self-examination. Any changes detected, no matter how small, should be reported to a health care provider. Women in their 37s and 30s should have a clinical breast exam (CBE) by a health care provider as part of a regular health exam every 1 to 3 years. After age 23, women should have a CBE every year. Starting at age 56, women should consider having a mammogram (breast X-ray test) every year. Women who have a family history of breast cancer should talk to their health care provider about genetic screening. Women at a high risk of breast cancer should talk to their health care providers about having an MRI and a mammogram every year.  Breast cancer gene (BRCA)-related cancer risk assessment is recommended for women who have family members with BRCA-related cancers. BRCA-related cancers include breast, ovarian, tubal, and peritoneal cancers. Having family members with these cancers may be associated with an increased risk for harmful changes (mutations) in the breast cancer genes BRCA1 and BRCA2.  Results of the assessment will determine the need for genetic counseling and BRCA1 and BRCA2 testing.  Routine pelvic exams to screen for cancer are no longer recommended for nonpregnant women who are considered low risk for cancer of the pelvic organs (ovaries, uterus, and vagina) and who do not have symptoms. Ask your health care provider if a screening pelvic exam is right for you.  If you have had past treatment for cervical cancer or a condition that could lead to cancer, you need Pap tests and screening for cancer for at least 20 years after your treatment. If Pap tests have been discontinued, your risk factors (such as having a new sexual partner) need to be reassessed to determine if screening should be resumed. Some women have medical problems that increase the chance of getting cervical cancer. In these cases, your health care provider may recommend more frequent screening and Pap tests.  The HPV test is an additional test that may be used for cervical cancer screening. The HPV test looks for the virus that can  cause the cell changes on the cervix. The cells collected during the Pap test can be tested for HPV. The HPV test could be used to screen women aged 14 years and older, and should be used in women of any age who have unclear Pap test results. After the age of 24, women should have HPV testing at the same frequency as a Pap test.  Colorectal cancer can be detected and often prevented. Most routine colorectal cancer screening begins at the age of 86 years and continues through age 82 years. However, your health care provider may recommend screening at an earlier age if you have risk factors for colon cancer. On a yearly basis, your health care provider may provide home test kits to check for hidden blood in the stool. Use of a small camera at the end of a tube, to directly examine the colon (sigmoidoscopy or colonoscopy), can detect the earliest forms of colorectal cancer. Talk to your health  care provider about this at age 25, when routine screening begins. Direct exam of the colon should be repeated every 5-10 years through age 37 years, unless early forms of pre-cancerous polyps or small growths are found.  People who are at an increased risk for hepatitis B should be screened for this virus. You are considered at high risk for hepatitis B if:  You were born in a country where hepatitis B occurs often. Talk with your health care provider about which countries are considered high risk.  Your parents were born in a high-risk country and you have not received a shot to protect against hepatitis B (hepatitis B vaccine).  You have HIV or AIDS.  You use needles to inject street drugs.  You live with, or have sex with, someone who has hepatitis B.  You get hemodialysis treatment.  You take certain medicines for conditions like cancer, organ transplantation, and autoimmune conditions.  Hepatitis C blood testing is recommended for all people born from [birth year] through 1965 and any individual with known risks for hepatitis C.  Practice safe sex. Use condoms and avoid high-risk sexual practices to reduce the spread of sexually transmitted infections (STIs). STIs include gonorrhea, chlamydia, syphilis, trichomonas, herpes, HPV, and human immunodeficiency virus (HIV). Herpes, HIV, and HPV are viral illnesses that have no cure. They can result in disability, cancer, and death.  You should be screened for sexually transmitted illnesses (STIs) including gonorrhea and chlamydia if:  You are sexually active and are younger than 24 years.  You are older than 24 years and your health care provider tells you that you are at risk for this type of infection.  Your sexual activity has changed since you were last screened and you are at an increased risk for chlamydia or gonorrhea. Ask your health care provider if you are at risk.  If you are at risk of being infected with HIV, it is recommended  that you take a prescription medicine daily to prevent HIV infection. This is called preexposure prophylaxis (PrEP). You are considered at risk if:  You are a heterosexual woman, are sexually active, and are at increased risk for HIV infection.  You take drugs by injection.  You are sexually active with a partner who has HIV.  Talk with your health care provider about whether you are at high risk of being infected with HIV. If you choose to begin PrEP, you should first be tested for HIV. You should then be tested every 3 months for as long as you  are taking PrEP.  Osteoporosis is a disease in which the bones lose minerals and strength with aging. This can result in serious bone fractures or breaks. The risk of osteoporosis can be identified using a bone density scan. Women ages 49 years and over and women at risk for fractures or osteoporosis should discuss screening with their health care providers. Ask your health care provider whether you should take a calcium supplement or vitamin D to reduce the rate of osteoporosis.  Menopause can be associated with physical symptoms and risks. Hormone replacement therapy is available to decrease symptoms and risks. You should talk to your health care provider about whether hormone replacement therapy is right for you.  Use sunscreen. Apply sunscreen liberally and repeatedly throughout the day. You should seek shade when your shadow is shorter than you. Protect yourself by wearing long sleeves, pants, a wide-brimmed hat, and sunglasses year round, whenever you are outdoors.  Once a month, do a whole body skin exam, using a mirror to look at the skin on your back. Tell your health care provider of new moles, moles that have irregular borders, moles that are larger than a pencil eraser, or moles that have changed in shape or color.  Stay current with required vaccines (immunizations).  Influenza vaccine. All adults should be immunized every year.  Tetanus,  diphtheria, and acellular pertussis (Td, Tdap) vaccine. Pregnant women should receive 1 dose of Tdap vaccine during each pregnancy. The dose should be obtained regardless of the length of time since the last dose. Immunization is preferred during the 27th-36th week of gestation. An adult who has not previously received Tdap or who does not know her vaccine status should receive 1 dose of Tdap. This initial dose should be followed by tetanus and diphtheria toxoids (Td) booster doses every 10 years. Adults with an unknown or incomplete history of completing a 3-dose immunization series with Td-containing vaccines should begin or complete a primary immunization series including a Tdap dose. Adults should receive a Td booster every 10 years.  Varicella vaccine. An adult without evidence of immunity to varicella should receive 2 doses or a second dose if she has previously received 1 dose. Pregnant females who do not have evidence of immunity should receive the first dose after pregnancy. This first dose should be obtained before leaving the health care facility. The second dose should be obtained 4-8 weeks after the first dose.  Human papillomavirus (HPV) vaccine. Females aged 13-26 years who have not received the vaccine previously should obtain the 3-dose series. The vaccine is not recommended for use in pregnant females. However, pregnancy testing is not needed before receiving a dose. If a female is found to be pregnant after receiving a dose, no treatment is needed. In that case, the remaining doses should be delayed until after the pregnancy. Immunization is recommended for any person with an immunocompromised condition through the age of 22 years if she did not get any or all doses earlier. During the 3-dose series, the second dose should be obtained 4-8 weeks after the first dose. The third dose should be obtained 24 weeks after the first dose and 16 weeks after the second dose.  Zoster vaccine. One dose  is recommended for adults aged 74 years or older unless certain conditions are present.  Measles, mumps, and rubella (MMR) vaccine. Adults born before [birth year] generally are considered immune to measles and mumps. Adults born in [birth year] or later should have 1 or more doses of MMR vaccine  unless there is a contraindication to the vaccine or there is laboratory evidence of immunity to each of the three diseases. A routine second dose of MMR vaccine should be obtained at least 28 days after the first dose for students attending postsecondary schools, health care workers, or international travelers. People who received inactivated measles vaccine or an unknown type of measles vaccine during 1963-1967 should receive 2 doses of MMR vaccine. People who received inactivated mumps vaccine or an unknown type of mumps vaccine before 1979 and are at high risk for mumps infection should consider immunization with 2 doses of MMR vaccine. For females of childbearing age, rubella immunity should be determined. If there is no evidence of immunity, females who are not pregnant should be vaccinated. If there is no evidence of immunity, females who are pregnant should delay immunization until after pregnancy. Unvaccinated health care workers born before [birth year] who lack laboratory evidence of measles, mumps, or rubella immunity or laboratory confirmation of disease should consider measles and mumps immunization with 2 doses of MMR vaccine or rubella immunization with 1 dose of MMR vaccine.  Pneumococcal 13-valent conjugate (PCV13) vaccine. When indicated, a person who is uncertain of her immunization history and has no record of immunization should receive the PCV13 vaccine. An adult aged 58 years or older who has certain medical conditions and has not been previously immunized should receive 1 dose of PCV13 vaccine. This PCV13 should be followed with a dose of pneumococcal polysaccharide (PPSV23) vaccine. The PPSV23 vaccine dose should be  obtained at least 8 weeks after the dose of PCV13 vaccine. An adult aged 65 years or older who has certain medical conditions and previously received 1 or more doses of PPSV23 vaccine should receive 1 dose of PCV13. The PCV13 vaccine dose should be obtained 1 or more years after the last PPSV23 vaccine dose.  Pneumococcal polysaccharide (PPSV23) vaccine. When PCV13 is also indicated, PCV13 should be obtained first. All adults aged 57 years and older should be immunized. An adult younger than age 77 years who has certain medical conditions should be immunized. Any person who resides in a nursing home or long-term care facility should be immunized. An adult smoker should be immunized. People with an immunocompromised condition and certain other conditions should receive both PCV13 and PPSV23 vaccines. People with human immunodeficiency virus (HIV) infection should be immunized as soon as possible after diagnosis. Immunization during chemotherapy or radiation therapy should be avoided. Routine use of PPSV23 vaccine is not recommended for American Indians, [place] Natives, or people younger than 65 years unless there are medical conditions that require PPSV23 vaccine. When indicated, people who have unknown immunization and have no record of immunization should receive PPSV23 vaccine. One-time revaccination 5 years after the first dose of PPSV23 is recommended for people aged 19-64 years who have chronic kidney failure, nephrotic syndrome, asplenia, or immunocompromised conditions. People who received 1-2 doses of PPSV23 before age 40 years should receive another dose of PPSV23 vaccine at age 28 years or later if at least 5 years have passed since the previous dose. Doses of PPSV23 are not needed for people immunized with PPSV23 at or after age 16 years.  Meningococcal vaccine. Adults with asplenia or persistent complement component deficiencies should receive 2 doses of quadrivalent meningococcal conjugate  (MenACWY-D) vaccine. The doses should be obtained at least 2 months apart. Microbiologists working with certain meningococcal bacteria, [place] recruits, people at risk during an outbreak, and people who travel to or live in countries  with a high rate of meningitis should be immunized. A first-year college student up through age 39 years who is living in a residence hall should receive a dose if she did not receive a dose on or after her 16th birthday. Adults who have certain high-risk conditions should receive one or more doses of vaccine.  Hepatitis A vaccine. Adults who wish to be protected from this disease, have certain high-risk conditions, work with hepatitis A-infected animals, work in hepatitis A research labs, or travel to or work in countries with a high rate of hepatitis A should be immunized. Adults who were previously unvaccinated and who anticipate close contact with an international adoptee during the first 60 days after arrival in the [place] from a country with a high rate of hepatitis A should be immunized.  Hepatitis B vaccine. Adults who wish to be protected from this disease, have certain high-risk conditions, may be exposed to blood or other infectious body fluids, are household contacts or sex partners of hepatitis B positive people, are clients or workers in certain care facilities, or travel to or work in countries with a high rate of hepatitis B should be immunized.  Haemophilus influenzae type b (Hib) vaccine. A previously unvaccinated person with asplenia or sickle cell disease or having a scheduled splenectomy should receive 1 dose of Hib vaccine. Regardless of previous immunization, a recipient of a hematopoietic stem cell transplant should receive a 3-dose series 6-12 months after her successful transplant. Hib vaccine is not recommended for adults with HIV infection. Preventive Services / Frequency Ages 70 to 35 years  Blood pressure check.** / Every 1 to 2  years.  Lipid and cholesterol check.** / Every 5 years beginning at age 19.  Clinical breast exam.** / Every 3 years for women in their 40s and 74s.  BRCA-related cancer risk assessment.** / For women who have family members with a BRCA-related cancer (breast, ovarian, tubal, or peritoneal cancers).  Pap test.** / Every 2 years from ages 39 through 62. Every 3 years starting at age 25 through age 55 or 34 with a history of 3 consecutive normal Pap tests.  HPV screening.** / Every 3 years from ages 61 through ages 69 to 32 with a history of 3 consecutive normal Pap tests.  Hepatitis C blood test.** / For any individual with known risks for hepatitis C.  Skin self-exam. / Monthly.  Influenza vaccine. / Every year.  Tetanus, diphtheria, and acellular pertussis (Tdap, Td) vaccine.** / Consult your health care provider. Pregnant women should receive 1 dose of Tdap vaccine during each pregnancy. 1 dose of Td every 10 years.  Varicella vaccine.** / Consult your health care provider. Pregnant females who do not have evidence of immunity should receive the first dose after pregnancy.  HPV vaccine. / 3 doses over 6 months, if 42 and younger. The vaccine is not recommended for use in pregnant females. However, pregnancy testing is not needed before receiving a dose.  Measles, mumps, rubella (MMR) vaccine.** / You need at least 1 dose of MMR if you were born in 1957 or later. You may also need a 2nd dose. For females of childbearing age, rubella immunity should be determined. If there is no evidence of immunity, females who are not pregnant should be vaccinated. If there is no evidence of immunity, females who are pregnant should delay immunization until after pregnancy.  Pneumococcal 13-valent conjugate (PCV13) vaccine.** / Consult your health care provider.  Pneumococcal polysaccharide (PPSV23) vaccine.** / 1  to 2 doses if you smoke cigarettes or if you have certain conditions.  Meningococcal  vaccine.** / 1 dose if you are age 62 to 94 years and a Market researcher living in a residence hall, or have one of several medical conditions, you need to get vaccinated against meningococcal disease. You may also need additional booster doses.  Hepatitis A vaccine.** / Consult your health care provider.  Hepatitis B vaccine.** / Consult your health care provider.  Haemophilus influenzae type b (Hib) vaccine.** / Consult your health care provider. Ages 36 to 83 years  Blood pressure check.** / Every 1 to 2 years.  Lipid and cholesterol check.** / Every 5 years beginning at age 24 years.  Lung cancer screening. / Every year if you are aged 45-80 years and have a 30-pack-year history of smoking and currently smoke or have quit within the past 15 years. Yearly screening is stopped once you have quit smoking for at least 15 years or develop a health problem that would prevent you from having lung cancer treatment.  Clinical breast exam.** / Every year after age 67 years.  BRCA-related cancer risk assessment.** / For women who have family members with a BRCA-related cancer (breast, ovarian, tubal, or peritoneal cancers).  Mammogram.** / Every year beginning at age 5 years and continuing for as long as you are in good health. Consult with your health care provider.  Pap test.** / Every 3 years starting at age 10 years through age 68 or 64 years with a history of 3 consecutive normal Pap tests.  HPV screening.** / Every 3 years from ages 40 years through ages 63 to 54 years with a history of 3 consecutive normal Pap tests.  Fecal occult blood test (FOBT) of stool. / Every year beginning at age 75 years and continuing until age 26 years. You may not need to do this test if you get a colonoscopy every 10 years.  Flexible sigmoidoscopy or colonoscopy.** / Every 5 years for a flexible sigmoidoscopy or every 10 years for a colonoscopy beginning at age 43 years and continuing until age 31  years.  Hepatitis C blood test.** / For all people born from [birth year] through 1965 and any individual with known risks for hepatitis C.  Skin self-exam. / Monthly.  Influenza vaccine. / Every year.  Tetanus, diphtheria, and acellular pertussis (Tdap/Td) vaccine.** / Consult your health care provider. Pregnant women should receive 1 dose of Tdap vaccine during each pregnancy. 1 dose of Td every 10 years.  Varicella vaccine.** / Consult your health care provider. Pregnant females who do not have evidence of immunity should receive the first dose after pregnancy.  Zoster vaccine.** / 1 dose for adults aged 52 years or older.  Measles, mumps, rubella (MMR) vaccine.** / You need at least 1 dose of MMR if you were born in 1957 or later. You may also need a 2nd dose. For females of childbearing age, rubella immunity should be determined. If there is no evidence of immunity, females who are not pregnant should be vaccinated. If there is no evidence of immunity, females who are pregnant should delay immunization until after pregnancy.  Pneumococcal 13-valent conjugate (PCV13) vaccine.** / Consult your health care provider.  Pneumococcal polysaccharide (PPSV23) vaccine.** / 1 to 2 doses if you smoke cigarettes or if you have certain conditions.  Meningococcal vaccine.** / Consult your health care provider.  Hepatitis A vaccine.** / Consult your health care provider.  Hepatitis B vaccine.** / Consult your health  care provider.  Haemophilus influenzae type b (Hib) vaccine.** / Consult your health care provider. Ages 50 years and over  Blood pressure check.** / Every 1 to 2 years.  Lipid and cholesterol check.** / Every 5 years beginning at age 38 years.  Lung cancer screening. / Every year if you are aged 4-80 years and have a 30-pack-year history of smoking and currently smoke or have quit within the past 15 years. Yearly screening is stopped once you have quit smoking for at least 15 years or  develop a health problem that would prevent you from having lung cancer treatment.  Clinical breast exam.** / Every year after age 47 years.  BRCA-related cancer risk assessment.** / For women who have family members with a BRCA-related cancer (breast, ovarian, tubal, or peritoneal cancers).  Mammogram.** / Every year beginning at age 78 years and continuing for as long as you are in good health. Consult with your health care provider.  Pap test.** / Every 3 years starting at age 78 years through age 31 or 87 years with 3 consecutive normal Pap tests. Testing can be stopped between 65 and 70 years with 3 consecutive normal Pap tests and no abnormal Pap or HPV tests in the past 10 years.  HPV screening.** / Every 3 years from ages 65 years through ages 5 or 78 years with a history of 3 consecutive normal Pap tests. Testing can be stopped between 65 and 70 years with 3 consecutive normal Pap tests and no abnormal Pap or HPV tests in the past 10 years.  Fecal occult blood test (FOBT) of stool. / Every year beginning at age 80 years and continuing until age 18 years. You may not need to do this test if you get a colonoscopy every 10 years.  Flexible sigmoidoscopy or colonoscopy.** / Every 5 years for a flexible sigmoidoscopy or every 10 years for a colonoscopy beginning at age 38 years and continuing until age 51 years.  Hepatitis C blood test.** / For all people born from [birth year] through 1966 and any individual with known risks for hepatitis C.  Osteoporosis screening.** / A one-time screening for women ages 57 years and over and women at risk for fractures or osteoporosis.  Skin self-exam. / Monthly.  Influenza vaccine. / Every year.  Tetanus, diphtheria, and acellular pertussis (Tdap/Td) vaccine.** / 1 dose of Td every 10 years.  Varicella vaccine.** / Consult your health care provider.  Zoster vaccine.** / 1 dose for adults aged 65 years or older.  Pneumococcal 13-valent conjugate  (PCV13) vaccine.** / Consult your health care provider.  Pneumococcal polysaccharide (PPSV23) vaccine.** / 1 dose for all adults aged 14 years and older.  Meningococcal vaccine.** / Consult your health care provider.  Hepatitis A vaccine.** / Consult your health care provider.  Hepatitis B vaccine.** / Consult your health care provider.  Haemophilus influenzae type b (Hib) vaccine.** / Consult your health care provider. ** Family history and personal history of risk and conditions may change your health care provider's recommendations. Document Released: 07/09/2001 Document Revised: 09/27/2013 Document Reviewed: 10/08/2010 [facility] Patient Information 2015 [place], [place]. This information is not intended to replace advice given to you by your health care provider. Make sure you discuss any questions you have with your health care provider.

## 2014-08-07 NOTE — Assessment & Plan Note (Signed)
Has changed her management regimen per gastroenterologies directions and is doing better.

## 2014-08-07 NOTE — Assessment & Plan Note (Signed)
Recheck Bone Density 

## 2014-08-07 NOTE — Assessment & Plan Note (Signed)
Agrees to titrate off of her hormones completely

## 2014-08-07 NOTE — Assessment & Plan Note (Signed)
Following with gastroenterology and doing better at the present time.

## 2014-08-07 NOTE — Assessment & Plan Note (Signed)
Patient encouraged to maintain heart healthy diet, regular exercise, adequate sleep. Consider daily probiotics. Take medications as prescribed. Patient denies any difficulties at home. No trouble with ADLs, depression or falls. No recent changes to vision or hearing. Is UTD with immunizations. Is UTD with screening. Discussed Advanced Directives, patient agrees to bring [place] copies of documents if can. Encouraged heart healthy diet, exercise as tolerated and adequate sleep.

## 2014-08-07 NOTE — Assessment & Plan Note (Signed)
Well controlled. Encouraged heart healthy diet such as the DASH diet and exercise as tolerated.  

## 2014-08-07 NOTE — Assessment & Plan Note (Addendum)
Avoid offending foods, continue probiotics. Do not eat large meals in late evening and consider raising head of bed. Is doing better with dietary changes. Patient is interested in minimizing PPI is given a prescription for Ranitidine to alternate with her Protonix as tolerated.

## 2014-08-15 LAB — HM DEXA SCAN

## 2014-08-16 ENCOUNTER — Encounter: Payer: 59 | Admitting: Internal Medicine

## 2014-08-16 ENCOUNTER — Ambulatory Visit (INDEPENDENT_AMBULATORY_CARE_PROVIDER_SITE_OTHER): Payer: Medicare Other | Admitting: Internal Medicine

## 2014-08-16 ENCOUNTER — Encounter: Payer: Self-pay | Admitting: Internal Medicine

## 2014-08-16 VITALS — BP 118/62 | HR 68 | Ht 65.0 in | Wt 124.2 lb

## 2014-08-16 DIAGNOSIS — K648 Other hemorrhoids: Secondary | ICD-10-CM

## 2014-08-16 NOTE — Patient Instructions (Signed)
You have been scheduled for your 3rd hemorrhoidal banding with Dr Hilarie Fredrickson on 09/08/14 at 1:30 pm.  HEMORRHOID BANDING PROCEDURE    FOLLOW-UP CARE   1. The procedure you have had should have been relatively painless since the banding of the area involved does not have nerve endings and there is no pain sensation.  The rubber band cuts off the blood supply to the hemorrhoid and the band may fall off as soon as 48 hours after the banding (the band may occasionally be seen in the toilet bowl following a bowel movement). You may notice a temporary feeling of fullness in the rectum which should respond adequately to plain Tylenol or Motrin.  2. Following the banding, avoid strenuous exercise that evening and resume full activity the next day.  A sitz bath (soaking in a warm tub) or bidet is soothing, and can be useful for cleansing the area after bowel movements.     3. To avoid constipation, take two tablespoons of natural wheat bran, natural oat bran, flax, Benefiber or any over the counter fiber supplement and increase your water intake to 7-8 glasses daily.    4. Unless you have been prescribed anorectal medication, do not put anything inside your rectum for two weeks: No suppositories, enemas, fingers, etc.  5. Occasionally, you may have more bleeding than usual after the banding procedure.  This is often from the untreated hemorrhoids rather than the treated one.  Don't be concerned if there is a tablespoon or so of blood.  If there is more blood than this, lie flat with your bottom higher than your head and apply an ice pack to the area. If the bleeding does not stop within a half an hour or if you feel faint, call our office at [phone] or go to the emergency room.  6. Problems are not common; however, if there is a substantial amount of bleeding, severe pain, chills, fever or difficulty passing urine (very rare) or other problems, you should call us at [phone] or report to the  nearest emergency room.  7. Do not stay seated continuously for more than 2-3 hours for a day or two after the procedure.  Tighten your buttock muscles 10-15 times every two hours and take 10-15 deep breaths every 1-2 hours.  Do not spend more than a few minutes on the toilet if you cannot empty your bowel; instead re-visit the toilet at a later time.

## 2014-08-16 NOTE — Progress Notes (Signed)
Patient ID: [name], female   DOB: 07/22/1940, 74 y.o.   MRN: [id number]  Pt returns today after hemorrhoidal banding #1 on 07/21/2014 Did well after 1st band Better bleeding, but not totally gone.  Less prolapse and pain with sitting Very happy with results so far Used lotrisone cream for perianal fungal infection which also helped   PROCEDURE NOTE: The patient presents with symptomatic grade 2-3  hemorrhoids, requesting rubber band ligation of her hemorrhoidal disease.  All risks, benefits and alternative forms of therapy were described and informed consent was obtained.   The anorectum was pre-medicated with 0.125% lidocaine The decision was made to band the LL  internal hemorrhoid (RA on band #1), and the [place] was used to perform band ligation without complication.  Digital anorectal examination was then performed to assure proper positioning of the band, and to adjust the banded tissue as required.  The patient was discharged home without pain or other issues.  Dietary and behavioral recommendations were given and along with follow-up instructions.     The following adjunctive treatments were recommended: As needed lomotil  Continue creon   The patient will return in 3 weeks  for  follow-up and possible additional banding as required. No complications were encountered and the patient tolerated the procedure well.

## 2014-09-08 ENCOUNTER — Ambulatory Visit (INDEPENDENT_AMBULATORY_CARE_PROVIDER_SITE_OTHER): Payer: Medicare Other | Admitting: Internal Medicine

## 2014-09-08 ENCOUNTER — Encounter: Payer: Self-pay | Admitting: Internal Medicine

## 2014-09-08 VITALS — BP 134/84 | HR 80 | Ht 64.75 in | Wt 124.4 lb

## 2014-09-08 DIAGNOSIS — K219 Gastro-esophageal reflux disease without esophagitis: Secondary | ICD-10-CM | POA: Diagnosis not present

## 2014-09-08 DIAGNOSIS — K648 Other hemorrhoids: Secondary | ICD-10-CM | POA: Diagnosis not present

## 2014-09-08 DIAGNOSIS — K642 Third degree hemorrhoids: Secondary | ICD-10-CM

## 2014-09-08 MED ORDER — RANITIDINE HCL 300 MG PO TABS: 300.0000 mg | ORAL_TABLET | Freq: Two times a day (BID) | ORAL | Status: DC

## 2014-09-08 NOTE — Patient Instructions (Signed)
We have sent the following medications to your pharmacy for you to pick up at your convenience: Ranitidine 300 mg twice daily  Call our office in 1 week if your GERD does not improve.  Please discontinue pantoprazole.  HEMORRHOID BANDING PROCEDURE    FOLLOW-UP CARE   1. The procedure you have had should have been relatively painless since the banding of the area involved does not have nerve endings and there is no pain sensation.  The rubber band cuts off the blood supply to the hemorrhoid and the band may fall off as soon as 48 hours after the banding (the band may occasionally be seen in the toilet bowl following a bowel movement). You may notice a temporary feeling of fullness in the rectum which should respond adequately to plain Tylenol or Motrin.  2. Following the banding, avoid strenuous exercise that evening and resume full activity the next day.  A sitz bath (soaking in a warm tub) or bidet is soothing, and can be useful for cleansing the area after bowel movements.     3. To avoid constipation, take two tablespoons of natural wheat bran, natural oat bran, flax, Benefiber or any over the counter fiber supplement and increase your water intake to 7-8 glasses daily.    4. Unless you have been prescribed anorectal medication, do not put anything inside your rectum for two weeks: No suppositories, enemas, fingers, etc.  5. Occasionally, you may have more bleeding than usual after the banding procedure.  This is often from the untreated hemorrhoids rather than the treated one.  Don't be concerned if there is a tablespoon or so of blood.  If there is more blood than this, lie flat with your bottom higher than your head and apply an ice pack to the area. If the bleeding does not stop within a half an hour or if you feel faint, call our office at [phone] or go to the emergency room.  6. Problems are not common; however, if there is a substantial amount of bleeding, severe pain,  chills, fever or difficulty passing urine (very rare) or other problems, you should call us at [phone] or report to the nearest emergency room.  7. Do not stay seated continuously for more than 2-3 hours for a day or two after the procedure.  Tighten your buttock muscles 10-15 times every two hours and take 10-15 deep breaths every 1-2 hours.  Do not spend more than a few minutes on the toilet if you cannot empty your bowel; instead re-visit the toilet at a later time.

## 2014-09-08 NOTE — Progress Notes (Signed)
Patient ID: [name], female   DOB: 08-Sep-1940, 74 y.o.   MRN: [id number]   Patient has done remarkably well after hemorrhoidal banding #1 and 2. She is noted almost complete resolution of bleeding with significant less prolapse and pain with sitting. Very happy with results. Separately today she wishes to discuss her gastroesophageal reflux disease. She has been maintained on pantoprazole 40 mg daily for quite some time. Occasionally she will take the medication twice daily for approximately a week when symptoms flare. This is been rare. After recent visit with primary care and news in the popular press regarding the possibility of PPI being correlated with dementia she was instituted in possibly trying to get off pantoprazole. Ranitidine 300 mg was prescribed and she has been using this once daily. She has noticed significant return of her heartburn as well as some upper epigastric discomfort. She is found her appetite to be slightly decreased and also increased discomfort with eating. Occasionally the pain radiates from her epigastrium to her back but this does not feel like her prior pancreatitis flares. Gen: awake, alert, NAD HEENT: anicteric, op clear CV: RRR, no mrg Pulm: CTA b/l Abd: soft, NT/ND, +BS throughout Ext: no c/c/e Neuro: nonfocal --GERD -- we discussed that H2 blockers need to be dosed twice a day for optimal acid control. She will begin ranitidine 300 mg twice daily. If symptoms of GERD and dyspepsia fail to improve she may need to return to pantoprazole 40 mg daily. I asked that she try the H2 blocker twice daily for 7-10 days and let me know how she is doing. She is happy with this plan  PROCEDURE NOTE: The patient presents with symptomatic grade 2-3 internal hemorrhoids, requesting rubber band ligation of her hemorrhoidal disease.  All risks, benefits and alternative forms of therapy were described and informed consent was obtained.   The anorectum was pre-medicated with  0.125% lidocaine The decision was made to band the right posterior internal hemorrhoid (Banding #1 RA, banding #2 LL), and the [place] was used to perform band ligation without complication.  Digital anorectal examination was then performed to assure proper positioning of the band, and to adjust the banded tissue as required.  The patient was discharged home without pain or other issues.  Dietary and behavioral recommendations were given and along with follow-up instructions.     The following adjunctive treatments were recommended: As needed Lomotil, continue Creon, see above   The patient will return in3 months for  follow-up and possible additional banding as required. No complications were encountered and the patient tolerated the procedure well.

## 2014-09-29 ENCOUNTER — Encounter: Payer: 59 | Admitting: Internal Medicine

## 2014-12-16 ENCOUNTER — Telehealth: Payer: Self-pay

## 2014-12-16 NOTE — Telephone Encounter (Signed)
Called to scheduled Annual Medicare Wellness Visit with Health Coach.  Left a message for call back.

## 2014-12-27 NOTE — Telephone Encounter (Signed)
Left a message for call back.  

## 2015-01-10 NOTE — Telephone Encounter (Signed)
Left a message for call back.  Closing encounter until patient calls back.

## 2015-01-25 ENCOUNTER — Other Ambulatory Visit: Payer: Self-pay | Admitting: Internal Medicine

## 2015-03-31 ENCOUNTER — Ambulatory Visit (INDEPENDENT_AMBULATORY_CARE_PROVIDER_SITE_OTHER): Payer: Medicare Other

## 2015-03-31 DIAGNOSIS — Z23 Encounter for immunization: Secondary | ICD-10-CM

## 2015-04-04 ENCOUNTER — Encounter: Payer: Self-pay | Admitting: Gastroenterology

## 2015-04-16 LAB — FECAL OCCULT BLOOD, GUAIAC: FECAL OCCULT BLD: NEGATIVE

## 2015-05-07 ENCOUNTER — Other Ambulatory Visit: Payer: Self-pay | Admitting: Internal Medicine

## 2015-05-08 ENCOUNTER — Telehealth: Payer: Self-pay | Admitting: Internal Medicine

## 2015-05-08 MED ORDER — PANCRELIPASE (LIP-PROT-AMYL) 24000-76000 UNITS PO CPEP: ORAL_CAPSULE | ORAL | Status: DC

## 2015-05-08 NOTE — Telephone Encounter (Signed)
Left voicemail advising patient she needs an appointment for refills of creon. Last office visit was 05-2014 with request for follow up in 6 months. Had hemorhoidal bandings, last in 08-2014.

## 2015-05-08 NOTE — Telephone Encounter (Signed)
Rx sent 

## 2015-06-07 ENCOUNTER — Encounter: Payer: Self-pay | Admitting: Family Medicine

## 2015-06-07 ENCOUNTER — Encounter: Payer: Self-pay | Admitting: *Deleted

## 2015-06-16 ENCOUNTER — Encounter: Payer: Self-pay | Admitting: Family Medicine

## 2015-06-26 ENCOUNTER — Other Ambulatory Visit: Payer: Self-pay | Admitting: Internal Medicine

## 2015-07-04 ENCOUNTER — Ambulatory Visit (INDEPENDENT_AMBULATORY_CARE_PROVIDER_SITE_OTHER): Payer: Medicare Other | Admitting: Internal Medicine

## 2015-07-04 ENCOUNTER — Encounter: Payer: Self-pay | Admitting: Internal Medicine

## 2015-07-04 VITALS — BP 102/70 | HR 68 | Ht 64.75 in | Wt 125.0 lb

## 2015-07-04 DIAGNOSIS — K861 Other chronic pancreatitis: Secondary | ICD-10-CM | POA: Diagnosis not present

## 2015-07-04 DIAGNOSIS — K589 Irritable bowel syndrome without diarrhea: Secondary | ICD-10-CM

## 2015-07-04 DIAGNOSIS — K219 Gastro-esophageal reflux disease without esophagitis: Secondary | ICD-10-CM | POA: Diagnosis not present

## 2015-07-04 MED ORDER — PANCRELIPASE (LIP-PROT-AMYL) 24000-76000 UNITS PO CPEP: ORAL_CAPSULE | ORAL | Status: DC

## 2015-07-04 MED ORDER — PANTOPRAZOLE SODIUM 40 MG PO TBEC: 40.0000 mg | DELAYED_RELEASE_TABLET | Freq: Every day | ORAL | Status: DC

## 2015-07-04 MED ORDER — RANITIDINE HCL 150 MG PO TABS: 150.0000 mg | ORAL_TABLET | Freq: Every evening | ORAL | Status: DC

## 2015-07-04 MED ORDER — DIPHENOXYLATE-ATROPINE 2.5-0.025 MG PO TABS: 1.0000 | ORAL_TABLET | Freq: Four times a day (QID) | ORAL | Status: DC | PRN

## 2015-07-04 NOTE — Progress Notes (Signed)
Subjective:    Patient ID: [name], female    DOB: Sep 05, 1940, 75 y.o.   MRN: [id number]  HPI [name] is a 75 year old female with autoimmune chronic pancreatitis, internal hemorrhoids status post banding, IBS, GERD, and osteopenia who is here for follow-up. She was last seen in the office for office visit in January 2016 became for hemorrhoidal banding 3 which finished in April 2016. She reports that 2 weeks ago she had a bad week of upper abdominal pain similar to her previous episodes of mild pancreatitis. She had associated nausea and pain radiating to the back. Decreased energy level. This has happened intermittently though fortunately infrequently. She puts herself on a bland often liquid diet and the pain gradually improves. It is now resolved completely. She's had less attacks of urgent diarrhea though this did occur on Sunday of this week. Stool is urgent and often uncontrollable. She did have an accident. No blood in her stool or melena. She wears pads to make clean up from accidents easier to deal with. She uses Lomotil which she says has been a Sports administrator for her. Hemorrhoids have been dramatically better after banding and she is pleased with the result. She continues Creon 6 tablets before meals and 1-2 tablets before snacks. She is taking probiotic. She is using pantoprazole 40 mg daily which she states if she misses she has heartburn and indigestion. Occasionally she will use Tums. She continues on estrogen replacement therapy but tried to wean down under the advice of her breast surgeon Dr. Lucia Gaskins. She was using 1 patch every week but weaned down to 1 patch every month. Near the end of the month she is having anxiety, hot flashes, uneasy feeling and fatigue. When she replaces the patch symptoms resolve and she feels "great". She will to continue the patches more frequently and says she knows the increased risk of blood clot and cancer.  CT scan from November 2015 performed for  chronic pancreatitis evaluation reviewed. This was stable without new or progressive findings. Slight prominence of the main pancreatic duct at that time was stable.  Review of Systems As per history of present illness, otherwise negative  Current Medications, Allergies, Past Medical History, Past Surgical History, Family History and Social History were reviewed in [employer] record.     Objective:   Physical Exam BP 102/70 mmHg  Pulse 68  Ht 5' 4.75" (1.645 m)  Wt 125 lb (56.7 kg)  BMI 20.95 kg/m2 Constitutional: Well-developed and well-nourished. No distress. HEENT: Normocephalic and atraumatic. Oropharynx is clear and moist. No oropharyngeal exudate. Conjunctivae are normal.  No scleral icterus. Neck: Neck supple. Trachea midline. Cardiovascular: Normal rate, regular rhythm and intact distal pulses. No M/R/G Pulmonary/chest: Effort normal and breath sounds normal. No wheezing, rales or rhonchi. Abdominal: Soft, thin,nontender, nondistended. Bowel sounds active throughout. There are no masses palpable Extremities: no clubbing, cyanosis, or edema Lymphadenopathy: No cervical adenopathy noted. Neurological: Alert and oriented to person place and time. Skin: Skin is warm and dry. No rashes noted. Psychiatric: Normal mood and affect. Behavior is normal.  CBC    Component Value Date/Time   WBC 6.0 08/01/2014 1227   RBC 4.70 08/01/2014 1227   HGB 14.6 08/01/2014 1227   HCT 43.1 08/01/2014 1227   PLT 275.0 08/01/2014 1227   MCV 91.6 08/01/2014 1227   MCH 31.1 03/26/2013 0931   MCHC 33.8 08/01/2014 1227   RDW 14.0 08/01/2014 1227   LYMPHSABS 2.0 03/08/2014 1553  MONOABS 0.7 03/08/2014 1553   EOSABS 0.1 03/08/2014 1553   BASOSABS 0.0 03/08/2014 1553    CMP     Component Value Date/Time   NA 141 08/01/2014 1227   K 4.0 08/01/2014 1227   CL 105 08/01/2014 1227   CO2 33* 08/01/2014 1227   GLUCOSE 101* 08/01/2014 1227   BUN 12 08/01/2014 1227    CREATININE 0.94 08/01/2014 1227   CREATININE 0.92 03/26/2013 0931   CALCIUM 9.8 08/01/2014 1227   PROT 6.9 08/01/2014 1227   ALBUMIN 4.2 08/01/2014 1227   AST 19 08/01/2014 1227   ALT 19 08/01/2014 1227   ALKPHOS 53 08/01/2014 1227   BILITOT 0.5 08/01/2014 1227   GFRNONAA 81.18 03/12/2010 0848   GFRAA  12/05/2008 0645    >60        The eGFR has been calculated using the MDRD equation. This calculation has not been validated in all clinical situations. eGFR's persistently <60 mL/min signify possible Chronic Kidney Disease.        Assessment & Plan:  74-year-old female with autoimmune chronic pancreatitis, internal hemorrhoids status post banding, IBS, GERD, and osteopenia who is here for follow-up.  1. Autoimmune chronic pancreatitis -- long-standing, currently asymptomatic. However 2 weeks ago it sounds like she had several days of active pancreatic inflammation. She reduce his dietary intake and these episodes usually resolve without the need for steroids. She expresses her desire to avoid steroids if at all possible. She has had cross-sectional imaging little over a year ago which was stable. Given lack of symptoms today, we'll continue to treat chronic pancreatitis without addition of steroids. Continue Creon 6 tablets before meals and 1-2 tablets before snacks. No evidence for malnutrition. Weight is stable. 5 me of any flares of abdominal pain which does resolve quickly.  2. IBS with alternating diarrhea and constipation -- improved with dietary change. Continue diet, FODMAP.  Creon as above. Lomotil up to 4 times daily as needed for diarrhea which works well for her.  3. Internal hemorrhoids -- resolved after banding with good treatment effect.  4. GERD/dyspepsia -- improved on pantoprazole. Continue pantoprazole 40 mg daily. Can use ranitidine 150 mg in the evening if needed for breakthrough symptoms.  5. CRC screening -- last colonoscopy normal without polyps other than  tortuosity in September 2009. Repeat would be due September 2019   9-12 month follow-up, sooner if necessary 

## 2015-07-04 NOTE — Patient Instructions (Signed)
We have sent the following prescriptions to your mail in pharmacy: Pantoprazole  Zantac as needed Lomotil as needed Creon  If you have not heard from your mail in pharmacy within 1 week or if you have not received your medication in the mail, please contact us at [phone] so we may find out why.  Please follow up with Dr Hilarie Fredrickson in 9 months.

## 2015-08-02 ENCOUNTER — Telehealth: Payer: Self-pay | Admitting: *Deleted

## 2015-08-02 ENCOUNTER — Encounter: Payer: Self-pay | Admitting: *Deleted

## 2015-08-02 NOTE — Telephone Encounter (Signed)
Pre-Visit Call completed with patient and chart updated.   Pre-Visit Info documented in Specialty Comments under [employer].    

## 2015-08-02 NOTE — Telephone Encounter (Signed)
Unable to reach patient at time of pre-visit call. Left message for patient to return call when available.  

## 2015-08-03 ENCOUNTER — Ambulatory Visit (INDEPENDENT_AMBULATORY_CARE_PROVIDER_SITE_OTHER): Payer: Medicare Other | Admitting: Family Medicine

## 2015-08-03 ENCOUNTER — Encounter: Payer: Self-pay | Admitting: Family Medicine

## 2015-08-03 VITALS — BP 132/84 | HR 75 | Temp 98.1°F | Ht 65.0 in | Wt 124.2 lb

## 2015-08-03 DIAGNOSIS — K861 Other chronic pancreatitis: Secondary | ICD-10-CM | POA: Diagnosis not present

## 2015-08-03 DIAGNOSIS — F411 Generalized anxiety disorder: Secondary | ICD-10-CM

## 2015-08-03 DIAGNOSIS — C4492 Squamous cell carcinoma of skin, unspecified: Secondary | ICD-10-CM

## 2015-08-03 DIAGNOSIS — R159 Full incontinence of feces: Secondary | ICD-10-CM

## 2015-08-03 DIAGNOSIS — E559 Vitamin D deficiency, unspecified: Secondary | ICD-10-CM | POA: Diagnosis not present

## 2015-08-03 DIAGNOSIS — K589 Irritable bowel syndrome without diarrhea: Secondary | ICD-10-CM

## 2015-08-03 DIAGNOSIS — R030 Elevated blood-pressure reading, without diagnosis of hypertension: Secondary | ICD-10-CM | POA: Diagnosis not present

## 2015-08-03 DIAGNOSIS — IMO0001 Reserved for inherently not codable concepts without codable children: Secondary | ICD-10-CM

## 2015-08-03 DIAGNOSIS — K219 Gastro-esophageal reflux disease without esophagitis: Secondary | ICD-10-CM

## 2015-08-03 DIAGNOSIS — Z780 Asymptomatic menopausal state: Secondary | ICD-10-CM

## 2015-08-03 DIAGNOSIS — Z0000 Encounter for general adult medical examination without abnormal findings: Secondary | ICD-10-CM | POA: Diagnosis not present

## 2015-08-03 DIAGNOSIS — M8580 Other specified disorders of bone density and structure, unspecified site: Secondary | ICD-10-CM

## 2015-08-03 DIAGNOSIS — F419 Anxiety disorder, unspecified: Secondary | ICD-10-CM | POA: Diagnosis not present

## 2015-08-03 LAB — CBC
HEMATOCRIT: 40.7 % (ref 36.0–46.0)
HEMOGLOBIN: 13.5 g/dL (ref 12.0–15.0)
MCHC: 33.3 g/dL (ref 30.0–36.0)
MCV: 91.7 fl (ref 78.0–100.0)
PLATELETS: 274 10*3/uL (ref 150.0–400.0)
RBC: 4.44 Mil/uL (ref 3.87–5.11)
RDW: 13.9 % (ref 11.5–15.5)
WBC: 6.3 10*3/uL (ref 4.0–10.5)

## 2015-08-03 LAB — COMPREHENSIVE METABOLIC PANEL
ALBUMIN: 4.1 g/dL (ref 3.5–5.2)
ALK PHOS: 51 U/L (ref 39–117)
ALT: 17 U/L (ref 0–35)
AST: 18 U/L (ref 0–37)
BILIRUBIN TOTAL: 0.5 mg/dL (ref 0.2–1.2)
BUN: 15 mg/dL (ref 6–23)
CALCIUM: 9.3 mg/dL (ref 8.4–10.5)
CHLORIDE: 107 meq/L (ref 96–112)
CO2: 29 mEq/L (ref 19–32)
Creatinine, Ser: 0.97 mg/dL (ref 0.40–1.20)
GFR: 59.43 mL/min — ABNORMAL LOW (ref >60.00)
Glucose, Bld: 95 mg/dL (ref 70–99)
Potassium: 4.2 mEq/L (ref 3.5–5.1)
Sodium: 143 mEq/L (ref 135–145)
TOTAL PROTEIN: 6.4 g/dL (ref 6.0–8.3)

## 2015-08-03 LAB — TSH: TSH: 1.95 u[IU]/mL (ref 0.35–4.50)

## 2015-08-03 LAB — AMYLASE: Amylase: 81 U/L (ref 27–131)

## 2015-08-03 LAB — LIPASE: LIPASE: 62 U/L — AB (ref 11.0–59.0)

## 2015-08-03 LAB — VITAMIN D 25 HYDROXY (VIT D DEFICIENCY, FRACTURES): VITD: 37.23 ng/mL (ref 30.00–100.00)

## 2015-08-03 MED ORDER — ESCITALOPRAM OXALATE 10 MG PO TABS: 10.0000 mg | ORAL_TABLET | Freq: Every day | ORAL | Status: DC

## 2015-08-03 MED ORDER — ESTRADIOL 0.025 MG/24HR TD PTTW: 1.0000 | MEDICATED_PATCH | TRANSDERMAL | Status: DC

## 2015-08-03 NOTE — Assessment & Plan Note (Addendum)
Doing well will check labs today, labs essentially stable

## 2015-08-03 NOTE — Progress Notes (Signed)
Pre visit review using our clinic review tool, if applicable. No additional management support is needed unless otherwise documented below in the visit note. 

## 2015-08-03 NOTE — Assessment & Plan Note (Signed)
Patient encouraged to maintain heart healthy diet, regular exercise, adequate sleep. Consider daily probiotics. Take medications as prescribed Given and reviewed copy of ACP documents from [place] of State and encouraged to complete and return  Medication: Reviewed with patient and updated  (all up to date, but patient not taking estrogen patch due to insurance/ cost issues)  Preferred Pharmacy and which med where: 90 day supply/mail order:  Local pharmacy:   Allergies verified: UTD  Immunization Status: Flu vaccine--04/04/14 Tdap--05/27/06 PNA--05/01/07 (unspecified) Shingles--09/01/07  A/P:  Changes to [place], [place] or Personal Hx: UTD  Pap-- 04/02/11- negative MMG--05/16/14 with Christene Slates at [place], BI-RADS 2: Benign Bone Density--05/08/12- at [facility] with Emmit Pomfret, MD- Osteopenia (f/u 2 yrs) CCS--07/30/12- patient reported normal  Care Teams Updated: Rolm Bookbinder, MD- Dermatology  Alphonsa Overall, MD- [place] up on breast biopsy Virl Axe, MD- Cardiology   ED/Hospital/Urgent Care Visits: none  To Discuss with Provider: Patient is concerned that her Protonix can cause dementia. Patient says that she spoke with Alphonsa Overall who is doing her breast exams, and he states that if it is OK with you, you could do the exams. He says that she has very dense tissue, possibly due to hormones, so he is happy to keep doing them if you would like.

## 2015-08-03 NOTE — Assessment & Plan Note (Signed)
Well controlled, no changes to meds. Encouraged heart healthy diet such as the DASH diet and exercise as tolerated.  °

## 2015-08-03 NOTE — Progress Notes (Signed)
Subjective:    Patient ID: [name], female    DOB: 1941-02-24, 75 y.o.   MRN: [id number]  Chief Complaint  Patient presents with  . Annual Exam    HPI Patient is in today for annual exam and follow up on numerous concerns. No recent illness or acute concern. No hospitalizations. Has been staying active, doing well with ADLs at home, maintains exercise and heart healthy diet. Denies CP/palp/SOB/HA/congestion/fevers/GI or GU c/o. Taking meds as prescribed. She is requesting to stay on hormone patches because it helps her sleep, with energy and anxiety. She denies any concerning symptoms with it.   Past Medical History  Diagnosis Date  . Pancreatitis chronic   . Esophageal reflux   . Internal hemorrhoids without mention of complication   . Osteoarthritis   . ANXIETY, CHRONIC   . CHEST PAIN     LHC 02/2011: normal cors, EF 65%  . HYPOTENSION, ORTHOSTATIC   . IBS (irritable bowel syndrome)   . PALPITATIONS, RECURRENT   . PSVT   . SHOULDER PAIN   . VITAMIN D DEFICIENCY   . TOBACCO USE, QUIT   . Fibrocystic breast   . 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 Osteopenia   . Allergic state   . Osteopenia   . Pain in ear   . Preventative health care   . Kidney lesion   . Hepatic cyst   . Diverticulosis   . Liver cyst   . Internal hemorrhoids     Past Surgical History  Procedure Laterality Date  . Abdominal hysterectomy  1998  . Cholecystectomy  1999  . Knee surgery  1958  . Tubal ligation      with D&C  . Breast surgery      breast bx on left, benign  . Cardiac catheterization      Family History  Problem Relation Age of Onset  . Hypertension Mother   . Stroke Mother   . Cancer Father     bladder  . Heart disease Father   . Breast cancer Cousin   . Ovarian cancer Paternal Aunt   . Cancer Paternal Aunt     breast, ovarian, pancreatic, cervical  . Pancreatic cancer Paternal Aunt   . Stomach cancer Paternal Uncle   . Cancer Paternal Uncle     lung,  stomach  . Uterine cancer Paternal Grandmother   . Cancer Paternal Grandmother     gyn cancer  . Heart disease Sister   . Breast cancer Paternal Aunt   . Heart disease Maternal Grandmother   . Stroke Maternal Grandmother   . Heart disease Maternal Grandfather   . Stroke Maternal Grandfather     Social History   Social History  . Marital Status: Married    Spouse Name: N/A  . Number of Children: 1  . Years of Education: N/A   Occupational History  . Retired    Social History Main Topics  . Smoking status: Former Smoker    Types: Cigarettes    Quit date: 05/27/1973  . Smokeless tobacco: Never Used  . Alcohol Use: No  . Drug Use: No  . Sexual Activity: Yes   Other Topics Concern  . Not on file   Social History Narrative    Outpatient Prescriptions Prior to Visit  Medication Sig Dispense Refill  . Ascorbic Acid (VITAMIN C) 500 MG tablet Take 500 mg by mouth daily.      Marland Kitchen aspirin 81 MG tablet Take 81 mg by  mouth 2 (two) times daily.      . cetirizine (ZYRTEC) 10 MG tablet Take 10 mg by mouth daily.      . Cholecalciferol (VITAMIN D3) 1000 UNITS CAPS Take 1 tablet by mouth daily.      Marland Kitchen CINNAMON PO Take by mouth daily.    . Cranberry 500 MG CAPS Take 1 capsule by mouth daily.    . diphenoxylate-atropine (LOMOTIL) 2.5-0.025 MG tablet Take 1 tablet by mouth 4 (four) times daily as needed for diarrhea or loose stools. 360 tablet 3  . fluticasone (FLONASE) 50 MCG/ACT nasal spray Place 2 sprays into the nose as needed for rhinitis or allergies. 16 g 5  . METROGEL 1 % gel BID times 48H.    . NON FORMULARY HORMONE PATCH    . Pancrelipase, Lip-Prot-Amyl, (CREON) 24000 units CPEP TAKE 6 CAPSULES BY MOUTH  WITH EACH MEAL AND 1-2  CAPSULES WITH EACH SNACK 2000 capsule 3  . pantoprazole (PROTONIX) 40 MG tablet Take 1 tablet (40 mg total) by mouth daily. 90 tablet 3  . Probiotic Product (PROBIOTIC PO) Take by mouth. Takes Probaclac probiotic once daily     . ranitidine (ZANTAC) 150  MG tablet Take 1 tablet (150 mg total) by mouth every evening. As needed (Patient taking differently: Take 150 mg by mouth as needed. As needed) 90 tablet 3  . TURMERIC PO Take by mouth daily.    Marland Kitchen escitalopram (LEXAPRO) 10 MG tablet Take 1 tablet (10 mg total) by mouth daily. 90 tablet 3   No facility-administered medications prior to visit.    Allergies  Allergen Reactions  . Amoxicillin     REACTION: unspecified  . Azithromycin     REACTION: Rash  . Ciprofloxacin     REACTION: GI upset  . Epinephrine     REACTION: unspecified  . Penicillins     REACTION: rash  . Sulfonamide Derivatives     REACTION: sick to stomach    Review of Systems  Constitutional: Negative for fever, chills and malaise/fatigue.  HENT: Negative for congestion and hearing loss.   Eyes: Negative for discharge.  Respiratory: Negative for cough, sputum production and shortness of breath.   Cardiovascular: Negative for chest pain, palpitations and leg swelling.  Gastrointestinal: Negative for heartburn, nausea, vomiting, abdominal pain, diarrhea, constipation and blood in stool.  Genitourinary: Negative for dysuria, urgency, frequency and hematuria.  Musculoskeletal: Negative for myalgias, back pain and falls.  Skin: Negative for rash.  Neurological: Negative for dizziness, sensory change, loss of consciousness, weakness and headaches.  Endo/Heme/Allergies: Negative for environmental allergies. Does not bruise/bleed easily.  Psychiatric/Behavioral: Negative for depression and suicidal ideas. The patient is nervous/anxious and has insomnia.        Objective:    Physical Exam  Constitutional: She is oriented to person, place, and time. She appears well-developed and well-nourished. No distress.  HENT:  Head: Normocephalic and atraumatic.  Right Ear: External ear normal.  Left Ear: External ear normal.  Nose: Nose normal.  Mouth/Throat: Oropharynx is clear and moist.  Eyes: Conjunctivae and EOM are  normal. Pupils are equal, round, and reactive to light. Right eye exhibits no discharge. Left eye exhibits no discharge.  Neck: Normal range of motion. Neck supple. No JVD present. No thyromegaly present.  Cardiovascular: Normal rate, regular rhythm, normal heart sounds and intact distal pulses.   No murmur heard. Pulmonary/Chest: Effort normal and breath sounds normal. No respiratory distress. She has no wheezes. She has no rales. She exhibits  no tenderness.  Abdominal: Soft. Bowel sounds are normal. She exhibits no distension and no mass. There is no tenderness. There is no rebound and no guarding.  Musculoskeletal: Normal range of motion. She exhibits no edema or tenderness.  Lymphadenopathy:    She has no cervical adenopathy.  Neurological: She is alert and oriented to person, place, and time. She has normal reflexes. No cranial nerve deficit.  Skin: Skin is warm and dry. No rash noted. She is not diaphoretic. No erythema.  Psychiatric: She has a normal mood and affect. Her behavior is normal. Judgment and thought content normal.  Nursing note and vitals reviewed.   BP 132/84 mmHg  Pulse 75  Temp(Src) 98.1 F (36.7 C) (Oral)  Ht 5\' 5"  (1.651 m)  Wt 124 lb 4 oz (56.359 kg)  BMI 20.68 kg/m2  SpO2 95% Wt Readings from Last 3 Encounters:  08/03/15 124 lb 4 oz (56.359 kg)  07/04/15 125 lb (56.7 kg)  09/08/14 124 lb 6 oz (56.416 kg)     Lab Results  Component Value Date   WBC 6.3 08/03/2015   HGB 13.5 08/03/2015   HCT 40.7 08/03/2015   PLT 274.0 08/03/2015   GLUCOSE 95 08/03/2015   CHOL 144 03/26/2013   TRIG 26 03/26/2013   HDL 96 03/26/2013   LDLCALC 43 03/26/2013   ALT 17 08/03/2015   AST 18 08/03/2015   NA 143 08/03/2015   K 4.2 08/03/2015   CL 107 08/03/2015   CREATININE 0.97 08/03/2015   BUN 15 08/03/2015   CO2 29 08/03/2015   TSH 1.95 08/03/2015   INR 1.00 03/22/2011    Lab Results  Component Value Date   TSH 1.95 08/03/2015   Lab Results  Component Value  Date   WBC 6.3 08/03/2015   HGB 13.5 08/03/2015   HCT 40.7 08/03/2015   MCV 91.7 08/03/2015   PLT 274.0 08/03/2015   Lab Results  Component Value Date   NA 143 08/03/2015   K 4.2 08/03/2015   CO2 29 08/03/2015   GLUCOSE 95 08/03/2015   BUN 15 08/03/2015   CREATININE 0.97 08/03/2015   BILITOT 0.5 08/03/2015   ALKPHOS 51 08/03/2015   AST 18 08/03/2015   ALT 17 08/03/2015   PROT 6.4 08/03/2015   ALBUMIN 4.1 08/03/2015   CALCIUM 9.3 08/03/2015   GFR 59.43* 08/03/2015   Lab Results  Component Value Date   CHOL 144 03/26/2013   Lab Results  Component Value Date   HDL 96 03/26/2013   Lab Results  Component Value Date   LDLCALC 43 03/26/2013   Lab Results  Component Value Date   TRIG 26 03/26/2013   Lab Results  Component Value Date   CHOLHDL 1.5 03/26/2013   No results found for: HGBA1C     Assessment & Plan:   Problem List Items Addressed This Visit    Anxiety state    Continue Lexapro      Relevant Medications   escitalopram (LEXAPRO) 10 MG tablet   Other Relevant Orders   IgG 1, 2, 3, and 4 (Completed)   Lipase (Completed)   Amylase (Completed)   CBC (Completed)   TSH (Completed)   Vitamin D (25 hydroxy) (Completed)   Comprehensive metabolic panel (Completed)   Lipase   Amylase   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([facility])    Doing well will check labs today, labs essentially stable      Relevant Orders   IgG 1, 2, 3, and 4 (Completed)  Lipase (Completed)   Amylase (Completed)   CBC (Completed)   TSH (Completed)   Vitamin D (25 hydroxy) (Completed)   Comprehensive metabolic panel (Completed)   Lipase   Amylase   Elevated blood pressure    Well controlled, no changes to meds. Encouraged heart healthy diet such as the DASH diet and exercise as tolerated.       Relevant Orders   IgG 1, 2, 3, and 4 (Completed)   Lipase (Completed)   Amylase (Completed)   CBC (Completed)   TSH (Completed)   Vitamin D (25 hydroxy) (Completed)    Comprehensive metabolic panel (Completed)   Lipase   Amylase   Full incontinence of feces   Relevant Orders   IgG 1, 2, 3, and 4 (Completed)   Lipase (Completed)   Amylase (Completed)   CBC (Completed)   TSH (Completed)   Vitamin D (25 hydroxy) (Completed)   Comprehensive metabolic panel (Completed)   Lipase   Amylase   GERD   Relevant Orders   IgG 1, 2, 3, and 4 (Completed)   Lipase (Completed)   Amylase (Completed)   CBC (Completed)   TSH (Completed)   Vitamin D (25 hydroxy) (Completed)   Comprehensive metabolic panel (Completed)   Lipase   Amylase   IBS (irritable bowel syndrome)   Relevant Orders   IgG 1, 2, 3, and 4 (Completed)   Lipase (Completed)   Amylase (Completed)   CBC (Completed)   TSH (Completed)   Vitamin D (25 hydroxy) (Completed)   Comprehensive metabolic panel (Completed)   Lipase   Amylase   Osteopenia    Encouraged to get adequate exercise, calcium and vitamin d intake      Relevant Orders   Lipase   Amylase   Post-menopause    Patient is aware of the risk of hormone supplements at her age but endorses that her quality life is greatly diminished without it, complaints are fatigue, increased anxiety and irritability as well as insomnia. She requests continuation of patch and it is granted.      Preventative health care    Patient encouraged to maintain heart healthy diet, regular exercise, adequate sleep. Consider daily probiotics. Take medications as prescribed Given and reviewed copy of ACP documents from [place] of State and encouraged to complete and return  Medication: Reviewed with patient and updated  (all up to date, but patient not taking estrogen patch due to insurance/ cost issues)  Preferred Pharmacy and which med where: 90 day supply/mail order:  Local pharmacy:   Allergies verified: UTD  Immunization Status: Flu vaccine--04/04/14 Tdap--05/27/06 PNA--05/01/07 (unspecified) Shingles--09/01/07  A/P:  Changes to [place], [place] or  Personal Hx: UTD  Pap-- 04/02/11- negative MMG--05/16/14 with Christene Slates at [place], BI-RADS 2: Benign Bone Density--05/08/12- at [facility] with Emmit Pomfret, MD- Osteopenia (f/u 2 yrs) CCS--07/30/12- patient reported normal  Care Teams Updated: Rolm Bookbinder, MD- Dermatology  Alphonsa Overall, MD- [place] up on breast biopsy Virl Axe, MD- Cardiology   ED/Hospital/Urgent Care Visits: none  To Discuss with Provider: Patient is concerned that her Protonix can cause dementia. Patient says that she spoke with Alphonsa Overall who is doing her breast exams, and he states that if it is OK with you, you could do the exams. He says that she has very dense tissue, possibly due to hormones, so he is happy to keep doing them if you would like.      Relevant Orders   Lipase   Amylase  Vitamin D deficiency    Encouraged daily vitamin d supplements      Relevant Orders   IgG 1, 2, 3, and 4 (Completed)   Lipase (Completed)   Amylase (Completed)   CBC (Completed)   TSH (Completed)   Vitamin D (25 hydroxy) (Completed)   Comprehensive metabolic panel (Completed)   Lipase   Amylase    Other Visit Diagnoses    Anxiety    -  Primary    Relevant Medications    escitalopram (LEXAPRO) 10 MG tablet    Other Relevant Orders    IgG 1, 2, 3, and 4 (Completed)    Lipase (Completed)    Amylase (Completed)    CBC (Completed)    TSH (Completed)    Vitamin D (25 hydroxy) (Completed)    Comprehensive metabolic panel (Completed)    Lipase    Amylase       I am having Ms. Hooey maintain her vitamin C, Vitamin D3, cetirizine, Probiotic Product (PROBIOTIC PO), aspirin, METROGEL, CINNAMON PO, fluticasone, Cranberry, diphenoxylate-atropine, pantoprazole, ranitidine, Pancrelipase (Lip-Prot-Amyl), TURMERIC PO, NON FORMULARY, escitalopram, and estradiol.  Meds ordered this encounter  Medications  . escitalopram (LEXAPRO) 10 MG tablet    Sig: Take 1 tablet (10 mg total) by mouth daily.     Dispense:  90 tablet    Refill:  3  . estradiol (VIVELLE-DOT) 0.025 MG/24HR    Sig: Place 1 patch onto the skin once a week.    Dispense:  12 patch    Refill:  3     Penni Homans, MD

## 2015-08-03 NOTE — Patient Instructions (Signed)
Preventive Care for Adults, Female A healthy lifestyle and preventive care can promote health and wellness. Preventive health guidelines for women include the following key practices.  A routine yearly physical is a good way to check with your health care provider about your health and preventive screening. It is a chance to share any concerns and updates on your health and to receive a thorough exam.  Visit your dentist for a routine exam and preventive care every 6 months. Brush your teeth twice a day and floss once a day. Good oral hygiene prevents tooth decay and gum disease.  The frequency of eye exams is based on your age, health, family medical history, use of contact lenses, and other factors. Follow your health care provider's recommendations for frequency of eye exams.  Eat a healthy diet. Foods like vegetables, fruits, whole grains, low-fat dairy products, and lean protein foods contain the nutrients you need without too many calories. Decrease your intake of foods high in solid fats, added sugars, and salt. Eat the right amount of calories for you.Get information about a proper diet from your health care provider, if necessary.  Regular physical exercise is one of the most important things you can do for your health. Most adults should get at least 150 minutes of moderate-intensity exercise (any activity that increases your heart rate and causes you to sweat) each week. In addition, most adults need muscle-strengthening exercises on 2 or more days a week.  Maintain a healthy weight. The body mass index (BMI) is a screening tool to identify possible weight problems. It provides an estimate of body fat based on height and weight. Your health care provider can find your BMI and can help you achieve or maintain a healthy weight.For adults 20 years and older:  A BMI below 18.5 is considered underweight.  A BMI of 18.5 to 24.9 is normal.  A BMI of 25 to 29.9 is considered  overweight.  A BMI of 30 and above is considered obese.  Maintain normal blood lipids and cholesterol levels by exercising and minimizing your intake of saturated fat. Eat a balanced diet with plenty of fruit and vegetables. Blood tests for lipids and cholesterol should begin at age 64 and be repeated every 5 years. If your lipid or cholesterol levels are high, you are over 50, or you are at high risk for heart disease, you may need your cholesterol levels checked more frequently.Ongoing high lipid and cholesterol levels should be treated with medicines if diet and exercise are not working.  If you smoke, find out from your health care provider how to quit. If you do not use tobacco, do not start.  Lung cancer screening is recommended for adults aged 52-80 years who are at high risk for developing lung cancer because of a history of smoking. A yearly low-dose CT scan of the lungs is recommended for people who have at least a 30-pack-year history of smoking and are a current smoker or have quit within the past 15 years. A pack year of smoking is smoking an average of 1 pack of cigarettes a day for 1 year (for example: 1 pack a day for 30 years or 2 packs a day for 15 years). Yearly screening should continue until the smoker has stopped smoking for at least 15 years. Yearly screening should be stopped for people who develop a health problem that would prevent them from having lung cancer treatment.  If you are pregnant, do not drink alcohol. If you are  breastfeeding, be very cautious about drinking alcohol. If you are not pregnant and choose to drink alcohol, do not have more than 1 drink per day. One drink is considered to be 12 ounces (355 mL) of beer, 5 ounces (148 mL) of wine, or 1.5 ounces (44 mL) of liquor.  Avoid use of street drugs. Do not share needles with anyone. Ask for help if you need support or instructions about stopping the use of drugs.  High blood pressure causes heart disease and  increases the risk of stroke. Your blood pressure should be checked at least every 1 to 2 years. Ongoing high blood pressure should be treated with medicines if weight loss and exercise do not work.  If you are 25-78 years old, ask your health care provider if you should take aspirin to prevent strokes.  Diabetes screening is done by taking a blood sample to check your blood glucose level after you have not eaten for a certain period of time (fasting). If you are not overweight and you do not have risk factors for diabetes, you should be screened once every 3 years starting at age 86. If you are overweight or obese and you are 3-87 years of age, you should be screened for diabetes every year as part of your cardiovascular risk assessment.  Breast cancer screening is essential preventive care for women. You should practice "breast self-awareness." This means understanding the normal appearance and feel of your breasts and may include breast self-examination. Any changes detected, no matter how small, should be reported to a health care provider. Women in their 66s and 30s should have a clinical breast exam (CBE) by a health care provider as part of a regular health exam every 1 to 3 years. After age 43, women should have a CBE every year. Starting at age 37, women should consider having a mammogram (breast X-ray test) every year. Women who have a family history of breast cancer should talk to their health care provider about genetic screening. Women at a high risk of breast cancer should talk to their health care providers about having an MRI and a mammogram every year.  Breast cancer gene (BRCA)-related cancer risk assessment is recommended for women who have family members with BRCA-related cancers. BRCA-related cancers include breast, ovarian, tubal, and peritoneal cancers. Having family members with these cancers may be associated with an increased risk for harmful changes (mutations) in the breast  cancer genes BRCA1 and BRCA2. Results of the assessment will determine the need for genetic counseling and BRCA1 and BRCA2 testing.  Your health care provider may recommend that you be screened regularly for cancer of the pelvic organs (ovaries, uterus, and vagina). This screening involves a pelvic examination, including checking for microscopic changes to the surface of your cervix (Pap test). You may be encouraged to have this screening done every 3 years, beginning at age 78.  For women ages 79-65, health care providers may recommend pelvic exams and Pap testing every 3 years, or they may recommend the Pap and pelvic exam, combined with testing for human papilloma virus (HPV), every 5 years. Some types of HPV increase your risk of cervical cancer. Testing for HPV may also be done on women of any age with unclear Pap test results.  Other health care providers may not recommend any screening for nonpregnant women who are considered low risk for pelvic cancer and who do not have symptoms. Ask your health care provider if a screening pelvic exam is right for  you.  If you have had past treatment for cervical cancer or a condition that could lead to cancer, you need Pap tests and screening for cancer for at least 20 years after your treatment. If Pap tests have been discontinued, your risk factors (such as having a new sexual partner) need to be reassessed to determine if screening should resume. Some women have medical problems that increase the chance of getting cervical cancer. In these cases, your health care provider may recommend more frequent screening and Pap tests.  Colorectal cancer can be detected and often prevented. Most routine colorectal cancer screening begins at the age of 50 years and continues through age 75 years. However, your health care provider may recommend screening at an earlier age if you have risk factors for colon cancer. On a yearly basis, your health care provider may provide  home test kits to check for hidden blood in the stool. Use of a small camera at the end of a tube, to directly examine the colon (sigmoidoscopy or colonoscopy), can detect the earliest forms of colorectal cancer. Talk to your health care provider about this at age 50, when routine screening begins. Direct exam of the colon should be repeated every 5-10 years through age 75 years, unless early forms of precancerous polyps or small growths are found.  People who are at an increased risk for hepatitis B should be screened for this virus. You are considered at high risk for hepatitis B if:  You were born in a country where hepatitis B occurs often. Talk with your health care provider about which countries are considered high risk.  Your parents were born in a high-risk country and you have not received a shot to protect against hepatitis B (hepatitis B vaccine).  You have HIV or AIDS.  You use needles to inject street drugs.  You live with, or have sex with, someone who has hepatitis B.  You get hemodialysis treatment.  You take certain medicines for conditions like cancer, organ transplantation, and autoimmune conditions.  Hepatitis C blood testing is recommended for all people born from 1945 through 1965 and any individual with known risks for hepatitis C.  Practice safe sex. Use condoms and avoid high-risk sexual practices to reduce the spread of sexually transmitted infections (STIs). STIs include gonorrhea, chlamydia, syphilis, trichomonas, herpes, HPV, and human immunodeficiency virus (HIV). Herpes, HIV, and HPV are viral illnesses that have no cure. They can result in disability, cancer, and death.  You should be screened for sexually transmitted illnesses (STIs) including gonorrhea and chlamydia if:  You are sexually active and are younger than 24 years.  You are older than 24 years and your health care provider tells you that you are at risk for this type of infection.  Your sexual  activity has changed since you were last screened and you are at an increased risk for chlamydia or gonorrhea. Ask your health care provider if you are at risk.  If you are at risk of being infected with HIV, it is recommended that you take a prescription medicine daily to prevent HIV infection. This is called preexposure prophylaxis (PrEP). You are considered at risk if:  You are sexually active and do not regularly use condoms or know the HIV status of your partner(s).  You take drugs by injection.  You are sexually active with a partner who has HIV.  Talk with your health care provider about whether you are at high risk of being infected with HIV. If   you choose to begin PrEP, you should first be tested for HIV. You should then be tested every 3 months for as long as you are taking PrEP.  Osteoporosis is a disease in which the bones lose minerals and strength with aging. This can result in serious bone fractures or breaks. The risk of osteoporosis can be identified using a bone density scan. Women ages 1 years and over and women at risk for fractures or osteoporosis should discuss screening with their health care providers. Ask your health care provider whether you should take a calcium supplement or vitamin D to reduce the rate of osteoporosis.  Menopause can be associated with physical symptoms and risks. Hormone replacement therapy is available to decrease symptoms and risks. You should talk to your health care provider about whether hormone replacement therapy is right for you.  Use sunscreen. Apply sunscreen liberally and repeatedly throughout the day. You should seek shade when your shadow is shorter than you. Protect yourself by wearing long sleeves, pants, a wide-brimmed hat, and sunglasses year round, whenever you are outdoors.  Once a month, do a whole body skin exam, using a mirror to look at the skin on your back. Tell your health care provider of new moles, moles that have irregular  borders, moles that are larger than a pencil eraser, or moles that have changed in shape or color.  Stay current with required vaccines (immunizations).  Influenza vaccine. All adults should be immunized every year.  Tetanus, diphtheria, and acellular pertussis (Td, Tdap) vaccine. Pregnant women should receive 1 dose of Tdap vaccine during each pregnancy. The dose should be obtained regardless of the length of time since the last dose. Immunization is preferred during the 27th-36th week of gestation. An adult who has not previously received Tdap or who does not know her vaccine status should receive 1 dose of Tdap. This initial dose should be followed by tetanus and diphtheria toxoids (Td) booster doses every 10 years. Adults with an unknown or incomplete history of completing a 3-dose immunization series with Td-containing vaccines should begin or complete a primary immunization series including a Tdap dose. Adults should receive a Td booster every 10 years.  Varicella vaccine. An adult without evidence of immunity to varicella should receive 2 doses or a second dose if she has previously received 1 dose. Pregnant females who do not have evidence of immunity should receive the first dose after pregnancy. This first dose should be obtained before leaving the health care facility. The second dose should be obtained 4-8 weeks after the first dose.  Human papillomavirus (HPV) vaccine. Females aged 13-26 years who have not received the vaccine previously should obtain the 3-dose series. The vaccine is not recommended for use in pregnant females. However, pregnancy testing is not needed before receiving a dose. If a female is found to be pregnant after receiving a dose, no treatment is needed. In that case, the remaining doses should be delayed until after the pregnancy. Immunization is recommended for any person with an immunocompromised condition through the age of 24 years if she did not get any or all doses  earlier. During the 3-dose series, the second dose should be obtained 4-8 weeks after the first dose. The third dose should be obtained 24 weeks after the first dose and 16 weeks after the second dose.  Zoster vaccine. One dose is recommended for adults aged 97 years or older unless certain conditions are present.  Measles, mumps, and rubella (MMR) vaccine. Adults born  before 1957 generally are considered immune to measles and mumps. Adults born in [birth year] or later should have 1 or more doses of MMR vaccine unless there is a contraindication to the vaccine or there is laboratory evidence of immunity to each of the three diseases. A routine second dose of MMR vaccine should be obtained at least 28 days after the first dose for students attending postsecondary schools, health care workers, or international travelers. People who received inactivated measles vaccine or an unknown type of measles vaccine during 1963-1967 should receive 2 doses of MMR vaccine. People who received inactivated mumps vaccine or an unknown type of mumps vaccine before 1979 and are at high risk for mumps infection should consider immunization with 2 doses of MMR vaccine. For females of childbearing age, rubella immunity should be determined. If there is no evidence of immunity, females who are not pregnant should be vaccinated. If there is no evidence of immunity, females who are pregnant should delay immunization until after pregnancy. Unvaccinated health care workers born before 60 who lack laboratory evidence of measles, mumps, or rubella immunity or laboratory confirmation of disease should consider measles and mumps immunization with 2 doses of MMR vaccine or rubella immunization with 1 dose of MMR vaccine.  Pneumococcal 13-valent conjugate (PCV13) vaccine. When indicated, a person who is uncertain of his immunization history and has no record of immunization should receive the PCV13 vaccine. All adults 61 years of age and older  should receive this vaccine. An adult aged 92 years or older who has certain medical conditions and has not been previously immunized should receive 1 dose of PCV13 vaccine. This PCV13 should be followed with a dose of pneumococcal polysaccharide (PPSV23) vaccine. Adults who are at high risk for pneumococcal disease should obtain the PPSV23 vaccine at least 8 weeks after the dose of PCV13 vaccine. Adults older than 76 years of age who have normal immune system function should obtain the PPSV23 vaccine dose at least 1 year after the dose of PCV13 vaccine.  Pneumococcal polysaccharide (PPSV23) vaccine. When PCV13 is also indicated, PCV13 should be obtained first. All adults aged 2 years and older should be immunized. An adult younger than age 30 years who has certain medical conditions should be immunized. Any person who resides in a nursing home or long-term care facility should be immunized. An adult smoker should be immunized. People with an immunocompromised condition and certain other conditions should receive both PCV13 and PPSV23 vaccines. People with human immunodeficiency virus (HIV) infection should be immunized as soon as possible after diagnosis. Immunization during chemotherapy or radiation therapy should be avoided. Routine use of PPSV23 vaccine is not recommended for American Indians, [place] Natives, or people younger than 65 years unless there are medical conditions that require PPSV23 vaccine. When indicated, people who have unknown immunization and have no record of immunization should receive PPSV23 vaccine. One-time revaccination 5 years after the first dose of PPSV23 is recommended for people aged 19-64 years who have chronic kidney failure, nephrotic syndrome, asplenia, or immunocompromised conditions. People who received 1-2 doses of PPSV23 before age 44 years should receive another dose of PPSV23 vaccine at age 83 years or later if at least 5 years have passed since the previous dose. Doses  of PPSV23 are not needed for people immunized with PPSV23 at or after age 20 years.  Meningococcal vaccine. Adults with asplenia or persistent complement component deficiencies should receive 2 doses of quadrivalent meningococcal conjugate (MenACWY-D) vaccine. The doses should be obtained  at least 2 months apart. Microbiologists working with certain meningococcal bacteria, [place] recruits, people at risk during an outbreak, and people who travel to or live in countries with a high rate of meningitis should be immunized. A first-year college student up through age 28 years who is living in a residence hall should receive a dose if she did not receive a dose on or after her 16th birthday. Adults who have certain high-risk conditions should receive one or more doses of vaccine.  Hepatitis A vaccine. Adults who wish to be protected from this disease, have certain high-risk conditions, work with hepatitis A-infected animals, work in hepatitis A research labs, or travel to or work in countries with a high rate of hepatitis A should be immunized. Adults who were previously unvaccinated and who anticipate close contact with an international adoptee during the first 60 days after arrival in the [place] from a country with a high rate of hepatitis A should be immunized.  Hepatitis B vaccine. Adults who wish to be protected from this disease, have certain high-risk conditions, may be exposed to blood or other infectious body fluids, are household contacts or sex partners of hepatitis B positive people, are clients or workers in certain care facilities, or travel to or work in countries with a high rate of hepatitis B should be immunized.  Haemophilus influenzae type b (Hib) vaccine. A previously unvaccinated person with asplenia or sickle cell disease or having a scheduled splenectomy should receive 1 dose of Hib vaccine. Regardless of previous immunization, a recipient of a hematopoietic stem cell transplant  should receive a 3-dose series 6-12 months after her successful transplant. Hib vaccine is not recommended for adults with HIV infection. Preventive Services / Frequency Ages 71 to 87 years  Blood pressure check.** / Every 3-5 years.  Lipid and cholesterol check.** / Every 5 years beginning at age 1.  Clinical breast exam.** / Every 3 years for women in their 3s and 31s.  BRCA-related cancer risk assessment.** / For women who have family members with a BRCA-related cancer (breast, ovarian, tubal, or peritoneal cancers).  Pap test.** / Every 2 years from ages 50 through 86. Every 3 years starting at age 87 through age 7 or 75 with a history of 3 consecutive normal Pap tests.  HPV screening.** / Every 3 years from ages 59 through ages 35 to 6 with a history of 3 consecutive normal Pap tests.  Hepatitis C blood test.** / For any individual with known risks for hepatitis C.  Skin self-exam. / Monthly.  Influenza vaccine. / Every year.  Tetanus, diphtheria, and acellular pertussis (Tdap, Td) vaccine.** / Consult your health care provider. Pregnant women should receive 1 dose of Tdap vaccine during each pregnancy. 1 dose of Td every 10 years.  Varicella vaccine.** / Consult your health care provider. Pregnant females who do not have evidence of immunity should receive the first dose after pregnancy.  HPV vaccine. / 3 doses over 6 months, if 72 and younger. The vaccine is not recommended for use in pregnant females. However, pregnancy testing is not needed before receiving a dose.  Measles, mumps, rubella (MMR) vaccine.** / You need at least 1 dose of MMR if you were born in 1957 or later. You may also need a 2nd dose. For females of childbearing age, rubella immunity should be determined. If there is no evidence of immunity, females who are not pregnant should be vaccinated. If there is no evidence of immunity, females who are  pregnant should delay immunization until after  pregnancy.  Pneumococcal 13-valent conjugate (PCV13) vaccine.** / Consult your health care provider.  Pneumococcal polysaccharide (PPSV23) vaccine.** / 1 to 2 doses if you smoke cigarettes or if you have certain conditions.  Meningococcal vaccine.** / 1 dose if you are age 87 to 44 years and a Market researcher living in a residence hall, or have one of several medical conditions, you need to get vaccinated against meningococcal disease. You may also need additional booster doses.  Hepatitis A vaccine.** / Consult your health care provider.  Hepatitis B vaccine.** / Consult your health care provider.  Haemophilus influenzae type b (Hib) vaccine.** / Consult your health care provider. Ages 86 to 38 years  Blood pressure check.** / Every year.  Lipid and cholesterol check.** / Every 5 years beginning at age 49 years.  Lung cancer screening. / Every year if you are aged 71-80 years and have a 30-pack-year history of smoking and currently smoke or have quit within the past 15 years. Yearly screening is stopped once you have quit smoking for at least 15 years or develop a health problem that would prevent you from having lung cancer treatment.  Clinical breast exam.** / Every year after age 51 years.  BRCA-related cancer risk assessment.** / For women who have family members with a BRCA-related cancer (breast, ovarian, tubal, or peritoneal cancers).  Mammogram.** / Every year beginning at age 18 years and continuing for as long as you are in good health. Consult with your health care provider.  Pap test.** / Every 3 years starting at age 63 years through age 37 or 57 years with a history of 3 consecutive normal Pap tests.  HPV screening.** / Every 3 years from ages 41 years through ages 76 to 23 years with a history of 3 consecutive normal Pap tests.  Fecal occult blood test (FOBT) of stool. / Every year beginning at age 36 years and continuing until age 51 years. You may not need  to do this test if you get a colonoscopy every 10 years.  Flexible sigmoidoscopy or colonoscopy.** / Every 5 years for a flexible sigmoidoscopy or every 10 years for a colonoscopy beginning at age 36 years and continuing until age 35 years.  Hepatitis C blood test.** / For all people born from [birth year] through 1965 and any individual with known risks for hepatitis C.  Skin self-exam. / Monthly.  Influenza vaccine. / Every year.  Tetanus, diphtheria, and acellular pertussis (Tdap/Td) vaccine.** / Consult your health care provider. Pregnant women should receive 1 dose of Tdap vaccine during each pregnancy. 1 dose of Td every 10 years.  Varicella vaccine.** / Consult your health care provider. Pregnant females who do not have evidence of immunity should receive the first dose after pregnancy.  Zoster vaccine.** / 1 dose for adults aged 73 years or older.  Measles, mumps, rubella (MMR) vaccine.** / You need at least 1 dose of MMR if you were born in 1957 or later. You may also need a second dose. For females of childbearing age, rubella immunity should be determined. If there is no evidence of immunity, females who are not pregnant should be vaccinated. If there is no evidence of immunity, females who are pregnant should delay immunization until after pregnancy.  Pneumococcal 13-valent conjugate (PCV13) vaccine.** / Consult your health care provider.  Pneumococcal polysaccharide (PPSV23) vaccine.** / 1 to 2 doses if you smoke cigarettes or if you have certain conditions.  Meningococcal vaccine.** /  Consult your health care provider.  Hepatitis A vaccine.** / Consult your health care provider.  Hepatitis B vaccine.** / Consult your health care provider.  Haemophilus influenzae type b (Hib) vaccine.** / Consult your health care provider. Ages 80 years and over  Blood pressure check.** / Every year.  Lipid and cholesterol check.** / Every 5 years beginning at age 62 years.  Lung cancer  screening. / Every year if you are aged 32-80 years and have a 30-pack-year history of smoking and currently smoke or have quit within the past 15 years. Yearly screening is stopped once you have quit smoking for at least 15 years or develop a health problem that would prevent you from having lung cancer treatment.  Clinical breast exam.** / Every year after age 61 years.  BRCA-related cancer risk assessment.** / For women who have family members with a BRCA-related cancer (breast, ovarian, tubal, or peritoneal cancers).  Mammogram.** / Every year beginning at age 39 years and continuing for as long as you are in good health. Consult with your health care provider.  Pap test.** / Every 3 years starting at age 85 years through age 74 or 72 years with 3 consecutive normal Pap tests. Testing can be stopped between 65 and 70 years with 3 consecutive normal Pap tests and no abnormal Pap or HPV tests in the past 10 years.  HPV screening.** / Every 3 years from ages 55 years through ages 67 or 77 years with a history of 3 consecutive normal Pap tests. Testing can be stopped between 65 and 70 years with 3 consecutive normal Pap tests and no abnormal Pap or HPV tests in the past 10 years.  Fecal occult blood test (FOBT) of stool. / Every year beginning at age 81 years and continuing until age 22 years. You may not need to do this test if you get a colonoscopy every 10 years.  Flexible sigmoidoscopy or colonoscopy.** / Every 5 years for a flexible sigmoidoscopy or every 10 years for a colonoscopy beginning at age 67 years and continuing until age 22 years.  Hepatitis C blood test.** / For all people born from [birth year] through 1966 and any individual with known risks for hepatitis C.  Osteoporosis screening.** / A one-time screening for women ages 8 years and over and women at risk for fractures or osteoporosis.  Skin self-exam. / Monthly.  Influenza vaccine. / Every year.  Tetanus, diphtheria, and  acellular pertussis (Tdap/Td) vaccine.** / 1 dose of Td every 10 years.  Varicella vaccine.** / Consult your health care provider.  Zoster vaccine.** / 1 dose for adults aged 56 years or older.  Pneumococcal 13-valent conjugate (PCV13) vaccine.** / Consult your health care provider.  Pneumococcal polysaccharide (PPSV23) vaccine.** / 1 dose for all adults aged 15 years and older.  Meningococcal vaccine.** / Consult your health care provider.  Hepatitis A vaccine.** / Consult your health care provider.  Hepatitis B vaccine.** / Consult your health care provider.  Haemophilus influenzae type b (Hib) vaccine.** / Consult your health care provider. ** Family history and personal history of risk and conditions may change your health care provider's recommendations.   This information is not intended to replace advice given to you by your health care provider. Make sure you discuss any questions you have with your health care provider.   Document Released: 07/09/2001 Document Revised: 06/03/2014 Document Reviewed: 10/08/2010 Elsevier Interactive Patient Education [employer].

## 2015-08-03 NOTE — Assessment & Plan Note (Signed)
Encouraged to get adequate exercise, calcium and vitamin d intake 

## 2015-08-09 ENCOUNTER — Telehealth: Payer: Self-pay | Admitting: Family Medicine

## 2015-08-09 LAB — IGG 1, 2, 3, AND 4
IGG (IMMUNOGLOBIN G), SERUM: 781 mg/dL (ref 690–1700)
IGG SUBCLASS 1: 376 mg/dL — AB (ref 382–929)
IGG SUBCLASS 4: 44.7 mg/dL (ref 4.0–86.0)
IgG Subclass 2: 206 mg/dL — ABNORMAL LOW (ref 241–700)
IgG Subclass 3: 56 mg/dL (ref 22–178)

## 2015-08-09 NOTE — Telephone Encounter (Signed)
Relation to [id number] Call back number:[phone] Pharmacy: [address] [place], [place] [phone] (Phone) [phone] (Fax)         Reason for call:  Patient was advised by mail order pharmacy estradiol (VIVELLE-DOT) 0.025 MG/24HR is in need of a Prior Auth. [facility] advised patient they were not going to contact PCP the patient had too. Please advise

## 2015-08-13 ENCOUNTER — Encounter: Payer: Self-pay | Admitting: Family Medicine

## 2015-08-13 DIAGNOSIS — C4492 Squamous cell carcinoma of skin, unspecified: Secondary | ICD-10-CM

## 2015-08-13 HISTORY — DX: Squamous cell carcinoma of skin, unspecified: C44.92

## 2015-08-13 NOTE — Assessment & Plan Note (Signed)
Encouraged daily vitamin d supplements

## 2015-08-13 NOTE — Assessment & Plan Note (Signed)
Continue Lexapro

## 2015-08-13 NOTE — Assessment & Plan Note (Signed)
Patient is aware of the risk of hormone supplements at her age but endorses that her quality life is greatly diminished without it, complaints are fatigue, increased anxiety and irritability as well as insomnia. She requests continuation of patch and it is granted.

## 2015-08-14 NOTE — Telephone Encounter (Signed)
[facility] prior-auth form faxed to [phone] Attention Ivin Booty.

## 2015-08-14 NOTE — Telephone Encounter (Signed)
Please call patient back and inform them the Policy & Procedure for PAs; her pharmacy must send the PA request, so that we know we have the correct information on file and the needed information to reach the Prior Authorization department, also; please inform her of the time period on processing and Insurance response/SLS

## 2015-08-15 ENCOUNTER — Encounter: Payer: Self-pay | Admitting: Internal Medicine

## 2015-08-15 ENCOUNTER — Ambulatory Visit (INDEPENDENT_AMBULATORY_CARE_PROVIDER_SITE_OTHER): Payer: Medicare Other | Admitting: Internal Medicine

## 2015-08-15 VITALS — BP 112/72 | HR 84 | Ht 65.0 in | Wt 126.0 lb

## 2015-08-15 DIAGNOSIS — I951 Orthostatic hypotension: Secondary | ICD-10-CM

## 2015-08-15 DIAGNOSIS — I471 Supraventricular tachycardia: Secondary | ICD-10-CM | POA: Diagnosis not present

## 2015-08-15 NOTE — Patient Instructions (Signed)

## 2015-08-15 NOTE — Progress Notes (Signed)
Patient Care Team: Mosie Lukes, MD as PCP - General (Family Medicine) Deboraha Sprang, MD as Consulting Physician (Cardiology) Jerene Bears, MD as Consulting Physician (Gastroenterology) Rolm Bookbinder, MD as Consulting Physician (Dermatology) Alphonsa Overall, MD as Consulting Physician (General Surgery) Melissa Montane, MD as Consulting Physician (Otolaryngology)   HPI  [name] is a 75 y.o. female Seen in followup. She has a history of recurrent chest pain. Myoview scanning 2010 was normal. Because of recurrent chest pain, 2012 she underwent catheterization that was normal  She has had some problems with orthostatic intolerance which is worse in the summer. She tries to maintain volume repletion  She and her husband travel extensively  She has occasional tachypalpitations.       Past Medical History  Diagnosis Date  . Pancreatitis chronic   . Esophageal reflux   . Internal hemorrhoids without mention of complication   . Osteoarthritis   . ANXIETY, CHRONIC   . CHEST PAIN     LHC 02/2011: normal cors, EF 65%  . HYPOTENSION, ORTHOSTATIC   . IBS (irritable bowel syndrome)   . PALPITATIONS, RECURRENT   . PSVT   . SHOULDER PAIN   . VITAMIN D DEFICIENCY   . TOBACCO USE, QUIT   . Fibrocystic breast   . 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 Osteopenia   . Allergic state   . Osteopenia   . Pain in ear   . Preventative health care   . Kidney lesion   . Hepatic cyst   . Diverticulosis   . Liver cyst   . Internal hemorrhoids   . 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 Abdominal hysterectomy  1998  . Cholecystectomy  1999  . Knee surgery  1958  . Tubal ligation      with D&C  . Breast surgery      breast bx on left, benign  . Cardiac catheterization      Current Outpatient Prescriptions  Medication Sig Dispense Refill  . Ascorbic Acid (VITAMIN C) 500 MG tablet Take 500 mg by mouth daily.      Marland Kitchen aspirin  81 MG tablet Take 81 mg by mouth 2 (two) times daily.      . cetirizine (ZYRTEC) 10 MG tablet Take 10 mg by mouth daily.      . Cholecalciferol (VITAMIN D3) 1000 UNITS CAPS Take 1 tablet by mouth daily.      Marland Kitchen CINNAMON PO Take by mouth daily.    . Cranberry 500 MG CAPS Take 1 capsule by mouth daily.    . diphenoxylate-atropine (LOMOTIL) 2.5-0.025 MG tablet Take 1 tablet by mouth 4 (four) times daily as needed for diarrhea or loose stools. 360 tablet 3  . escitalopram (LEXAPRO) 10 MG tablet Take 1 tablet (10 mg total) by mouth daily. 90 tablet 3  . estradiol (VIVELLE-DOT) 0.025 MG/24HR Place 1 patch onto the skin once a week. 12 patch 3  . fluticasone (FLONASE) 50 MCG/ACT nasal spray Place 2 sprays into the nose as needed for rhinitis or allergies. 16 g 5  . METROGEL 1 % gel BID times 48H.    . NON FORMULARY HORMONE PATCH    . Pancrelipase, Lip-Prot-Amyl, (CREON) 24000 units CPEP TAKE 6 CAPSULES BY MOUTH  WITH EACH MEAL AND 1-2  CAPSULES WITH EACH SNACK 2000 capsule 3  . pantoprazole (PROTONIX) 40 MG tablet Take 1 tablet (40 mg total) by mouth daily. [place]  tablet 3  . Probiotic Product (PROBIOTIC PO) Take by mouth. Takes Probaclac probiotic once daily     . ranitidine (ZANTAC) 150 MG tablet Take 1 tablet (150 mg total) by mouth every evening. As needed (Patient taking differently: Take 150 mg by mouth as needed. As needed) 90 tablet 3  . TURMERIC PO Take by mouth daily.     No current facility-administered medications for this visit.    Allergies  Allergen Reactions  . Amoxicillin     REACTION: unspecified  . Azithromycin     REACTION: Rash  . Ciprofloxacin     REACTION: GI upset  . Epinephrine     REACTION: unspecified  . Penicillins     REACTION: rash  . Sulfonamide Derivatives     REACTION: sick to stomach    Review of Systems negative except from HPI and PMH  Physical Exam BP 112/72 mmHg  Pulse 84  Ht 5\' 5"  (1.651 m)  Wt 126 lb (57.153 kg)  BMI 20.97 kg/m2 Well developed  and nourished in no acute distress HENT normal Neck supple with JVP-flat Clear Regular rate and rhythm, no murmurs or gallops Abd-soft with active BS No Clubbing cyanosis edema Skin-warm and dry A & Oriented  Grossly normal sensory and motor function   ECG 69 13/09/41     Assessment and  Plan  Hypertension well controlled  Atrial Tach-- no significant recurrences  Orthostatic intolerance  Hormone replacement therapy    We discussed risks from UpTO Date relaed to estrogen only HRT related to heart and breast cancer concerns  Some orthostatic intolerance mostly related to heat  - same recs  We spent more than 50% of our >25 min visit in face to face counseling regarding the above

## 2015-08-16 NOTE — Telephone Encounter (Signed)
PA process initiated/SLS

## 2015-08-16 NOTE — Telephone Encounter (Signed)
Pt called stating that estradiol needs PA and she talked to her insurance company. They said it would be faster and they could ship same day if we call. [facility] PA phone # [phone]. Pt request we call today and let her know if we cannot do it today. She said that we need to check on the qty. Pt phone # [phone].

## 2015-08-16 NOTE — Telephone Encounter (Signed)
Pt called again and states that RX needs to be changed to 2x 1/week in order for them to send more than 1 box (because there are 8 patches per box). RX needs to be increased in order to send more patches. Please update as needed.

## 2015-08-22 ENCOUNTER — Other Ambulatory Visit: Payer: Self-pay | Admitting: Family Medicine

## 2015-08-22 NOTE — Telephone Encounter (Signed)
OK to give new rx for vivelle dot and sig to read apply 1 patch topically 2 x weekly

## 2015-08-22 NOTE — Telephone Encounter (Signed)
Pharmacy fax clarify estradiol vivelle-dot patch. Instructions on medication list are to use once a week, but pharmacy says this prescription is written normally to place on skin twice a week. I did call the patient and she would prefer instructions to be changed to state 1 patch twice a week.  She was going to try and go off them, but unable to do so. Advise if ok to change the instructions to twice a week and can send in refill to [facility].

## 2015-08-23 MED ORDER — ESTRADIOL 0.025 MG/24HR TD PTTW: MEDICATED_PATCH | TRANSDERMAL | Status: DC

## 2015-08-23 NOTE — Telephone Encounter (Signed)
New Rx sent to pharmacy with sig as indicated below.

## 2015-08-23 NOTE — Telephone Encounter (Signed)
Received Approval on appeal process for Vivelle-Dot, Authorization number [id number], valid 08/07/15 thru 05/26/2016; faxed to [facility]/SLS 03/29

## 2015-09-05 ENCOUNTER — Telehealth: Payer: Self-pay | Admitting: Internal Medicine

## 2015-09-05 NOTE — Telephone Encounter (Signed)
Pt had labs done by Dr. Randel Pigg and would like for Dr. Hilarie Fredrickson to review them.

## 2015-09-13 NOTE — Telephone Encounter (Signed)
Result letter mailed to pt.

## 2015-09-13 NOTE — Telephone Encounter (Signed)
Labs reviewed Persistent mild lipase elevation is stable Liver enzymes are normal Follow-up with me as previously recommended

## 2016-01-15 ENCOUNTER — Other Ambulatory Visit: Payer: Self-pay | Admitting: Family Medicine

## 2016-01-15 MED ORDER — ESTRADIOL 0.025 MG/24HR TD PTTW: MEDICATED_PATCH | TRANSDERMAL | 11 refills | Status: DC

## 2016-02-05 ENCOUNTER — Ambulatory Visit: Payer: Medicare Other | Admitting: *Deleted

## 2016-03-11 ENCOUNTER — Encounter: Payer: Self-pay | Admitting: *Deleted

## 2016-03-19 ENCOUNTER — Ambulatory Visit (INDEPENDENT_AMBULATORY_CARE_PROVIDER_SITE_OTHER): Payer: Medicare Other

## 2016-03-19 DIAGNOSIS — Z23 Encounter for immunization: Secondary | ICD-10-CM

## 2016-03-23 ENCOUNTER — Telehealth: Payer: Self-pay

## 2016-03-23 NOTE — Telephone Encounter (Signed)
Appt for September was canceled. Will this be rescheduled?

## 2016-03-25 NOTE — Telephone Encounter (Signed)
Patient declined scheduling medicare wellness appointment and stated medicare wellness is not needed and will see Dr. Charlett Blake in March for her physical.

## 2016-03-28 ENCOUNTER — Other Ambulatory Visit (INDEPENDENT_AMBULATORY_CARE_PROVIDER_SITE_OTHER): Payer: Medicare Other

## 2016-03-28 ENCOUNTER — Ambulatory Visit (INDEPENDENT_AMBULATORY_CARE_PROVIDER_SITE_OTHER): Payer: Medicare Other | Admitting: Internal Medicine

## 2016-03-28 VITALS — BP 110/82 | HR 84 | Ht 64.37 in | Wt 124.5 lb

## 2016-03-28 DIAGNOSIS — K219 Gastro-esophageal reflux disease without esophagitis: Secondary | ICD-10-CM | POA: Diagnosis not present

## 2016-03-28 DIAGNOSIS — K861 Other chronic pancreatitis: Secondary | ICD-10-CM | POA: Diagnosis not present

## 2016-03-28 DIAGNOSIS — K589 Irritable bowel syndrome without diarrhea: Secondary | ICD-10-CM

## 2016-03-28 LAB — COMPREHENSIVE METABOLIC PANEL
ALBUMIN: 4 g/dL (ref 3.5–5.2)
ALK PHOS: 53 U/L (ref 39–117)
ALT: 16 U/L (ref 0–35)
AST: 18 U/L (ref 0–37)
BUN: 14 mg/dL (ref 6–23)
CALCIUM: 9.5 mg/dL (ref 8.4–10.5)
CO2: 31 mEq/L (ref 19–32)
Chloride: 103 mEq/L (ref 96–112)
Creatinine, Ser: 0.94 mg/dL (ref 0.40–1.20)
GFR: 61.52 mL/min (ref >60.00)
Glucose, Bld: 98 mg/dL (ref 70–99)
POTASSIUM: 3.7 meq/L (ref 3.5–5.1)
Sodium: 141 mEq/L (ref 135–145)
TOTAL PROTEIN: 6.5 g/dL (ref 6.0–8.3)
Total Bilirubin: 0.5 mg/dL (ref 0.2–1.2)

## 2016-03-28 NOTE — Patient Instructions (Addendum)
Continue Creon 6 capsules with each meal, 1-2 capsules with each snack  Continue pantoprazole.  You may take lomotil as needed.  Continue ranitidine.    Follow up with Dr Hilarie Fredrickson in 6 months.  Your physician has requested that you go to the basement for the following lab work before leaving today: [facility] have been scheduled for an MRI abdomen at [facility] on Wednesday, 11/8/7. Your appointment time is 8:00 am. Please arrive 15 minutes prior to your appointment time for registration purposes. Please make certain not to have anything to eat or drink 4 hours prior to your test. In addition, if you have any metal in your body, have a pacemaker or defibrillator, please be sure to let your ordering physician know. This test typically takes 45 minutes to 1 hour to complete.  If you are age 31 or older, your body mass index should be between 23-30. Your Body mass index is 21.13 kg/m. If this is out of the aforementioned range listed, please consider follow up with your Primary Care Provider.  If you are age 33 or younger, your body mass index should be between 19-25. Your Body mass index is 21.13 kg/m. If this is out of the aformentioned range listed, please consider follow up with your Primary Care Provider.

## 2016-03-28 NOTE — Progress Notes (Signed)
Subjective:    Patient ID: [name], female    DOB: 06-Oct-1940, 75 y.o.   MRN: [id number]  HPI [name] is a 75 year old female with a history of autoimmune chronic pancreatitis, IBS, GERD, internal hemorrhoids status post banding is here for follow-up. She was last seen in the office in February 2017. She is here alone today. She reports she's been doing very well of late. She's had no issues with recurrent upper abdominal pain. No nausea or vomiting. Her heartburn has been well controlled with pantoprazole 40 mg once daily. Attempts to discontinue this in the past of lead to moderate severe recurrent symptoms. She has Zantac 150 mg which she uses only as needed in the evening which she reports has been rare of late. Her appetite has been good and her weight has been stable. Energy levels have been good. She returned from a cruise of the [place] recently. She will with her husband and they had a great time. She's continuing to use Creon 6 caps with all meals and 1-2 with snacks. If she takes less than 6 she develops crampy abdominal pain and diarrhea. Stools have been mostly formed occasionally loose and she will use Lomotil on occasion. It works well when needed. No blood in her stool or melena.  She completed hemorrhoidal banding and reports dramatic improvement in her hemorrhoidal symptoms.  Review of Systems As per history of present illness, otherwise negative  Current Medications, Allergies, Past Medical History, Past Surgical History, Family History and Social History were reviewed in [employer] record.     Objective:   Physical Exam BP 110/82   Pulse 84   Ht 5' 4.37" (1.635 m) Comment: without shoes  Wt 124 lb 8 oz (56.5 kg)   BMI 21.13 kg/m  Constitutional: Well-developed and well-nourished. No distress. HEENT: Normocephalic and atraumatic. Conjunctivae are normal.  No scleral icterus. Neck: Neck supple. Trachea midline. Cardiovascular:  Normal rate, regular rhythm and intact distal pulses. No M/R/G Pulmonary/chest: Effort normal and breath sounds normal. No wheezing, rales or rhonchi. Abdominal: Soft, thin, nontender, nondistended. Bowel sounds active throughout. There are no masses palpable. No hepatosplenomegaly. Extremities: no clubbing, cyanosis, or edema Neurological: Alert and oriented to person place and time. Skin: Skin is warm and dry. No rashes noted. Psychiatric: Normal mood and affect. Behavior is normal.  CBC    Component Value Date/Time   WBC 6.3 08/03/2015 1150   RBC 4.44 08/03/2015 1150   HGB 13.5 08/03/2015 1150   HCT 40.7 08/03/2015 1150   PLT 274.0 08/03/2015 1150   MCV 91.7 08/03/2015 1150   MCH 31.1 03/26/2013 0931   MCHC 33.3 08/03/2015 1150   RDW 13.9 08/03/2015 1150   LYMPHSABS 2.0 03/08/2014 1553   MONOABS 0.7 03/08/2014 1553   EOSABS 0.1 03/08/2014 1553   BASOSABS 0.0 03/08/2014 1553   CMP     Component Value Date/Time   NA 143 08/03/2015 1150   K 4.2 08/03/2015 1150   CL 107 08/03/2015 1150   CO2 29 08/03/2015 1150   GLUCOSE 95 08/03/2015 1150   BUN 15 08/03/2015 1150   CREATININE 0.97 08/03/2015 1150   CREATININE 0.92 03/26/2013 0931   CALCIUM 9.3 08/03/2015 1150   PROT 6.4 08/03/2015 1150   ALBUMIN 4.1 08/03/2015 1150   AST 18 08/03/2015 1150   ALT 17 08/03/2015 1150   ALKPHOS 51 08/03/2015 1150   BILITOT 0.5 08/03/2015 1150   GFRNONAA 81.18 03/12/2010 0848   GFRAA  12/05/2008 0645    >60        The eGFR has been calculated using the MDRD equation. This calculation has not been validated in all clinical situations. eGFR's persistently <60 mL/min signify possible Chronic Kidney Disease.      Assessment & Plan:  75 year old female with a history of autoimmune chronic pancreatitis, IBS, GERD, internal hemorrhoids status post banding is here for follow-up.  1. Chronic, autoimmune, pancreatitis -- long-standing currently asymptomatic without recent flare of her  pancreatitis by symptoms. She will continue Creon supplementation 6 tablets before meals and 1-2 with snacks. No evidence for malnutrition. I recommended MRI of the abdomen for surveillance given her long-standing chronic pancreatitis. Rule out cystic and solid pancreatic lesions. Notify me of any recurrent upper abdominal pain.  2. IBS with alternating diarrhea constipation -- improved with FODMAP diet. She is on Creon as above. Can use Lomotil up to 4 times a day as needed for diarrhea which works well for her.  3. Internal hemorrhoids -- resolved with banding  4. GERD/dyspepsia -- well-controlled with once daily PPI and evening H2 blocker when necessary. Continue current medications.  5. CRC screening -- surveillance colonoscopy due September 2019  6-9 month follow-up, sooner if necessary 25 minutes spent with the patient today. Greater than 50% was spent in counseling and coordination of care with the patient

## 2016-04-03 ENCOUNTER — Other Ambulatory Visit: Payer: Self-pay

## 2016-04-03 ENCOUNTER — Ambulatory Visit (HOSPITAL_COMMUNITY)
Admission: RE | Admit: 2016-04-03 | Discharge: 2016-04-03 | Disposition: A | Discharge status: Home / Self Care | Payer: Medicare Other | Source: Ambulatory Visit | Attending: Internal Medicine | Admitting: Internal Medicine

## 2016-04-03 DIAGNOSIS — K760 Fatty (change of) liver, not elsewhere classified: Secondary | ICD-10-CM | POA: Insufficient documentation

## 2016-04-03 DIAGNOSIS — R1084 Generalized abdominal pain: Secondary | ICD-10-CM | POA: Insufficient documentation

## 2016-04-03 DIAGNOSIS — N281 Cyst of kidney, acquired: Secondary | ICD-10-CM | POA: Insufficient documentation

## 2016-04-03 DIAGNOSIS — K7689 Other specified diseases of liver: Secondary | ICD-10-CM | POA: Insufficient documentation

## 2016-04-03 MED ORDER — GADOBENATE DIMEGLUMINE 529 MG/ML IV SOLN
12.0000 mL | Freq: Once | INTRAVENOUS | Status: AC | PRN
  Administered 2016-04-03: 12 mL via INTRAVENOUS

## 2016-05-24 LAB — HM MAMMOGRAPHY

## 2016-05-27 ENCOUNTER — Other Ambulatory Visit: Payer: Self-pay | Admitting: Internal Medicine

## 2016-05-29 ENCOUNTER — Encounter: Payer: Self-pay | Admitting: Family Medicine

## 2016-06-05 ENCOUNTER — Telehealth: Payer: Self-pay | Admitting: *Deleted

## 2016-06-05 NOTE — Telephone Encounter (Signed)
Called patient and left message to return call to schedule AWV w/ [facility]. 

## 2016-07-01 ENCOUNTER — Ambulatory Visit (INDEPENDENT_AMBULATORY_CARE_PROVIDER_SITE_OTHER): Payer: Medicare Other | Admitting: Family Medicine

## 2016-07-01 VITALS — BP 140/87 | HR 95 | Temp 97.9°F | Ht 65.0 in | Wt 123.6 lb

## 2016-07-01 DIAGNOSIS — T7840XD Allergy, unspecified, subsequent encounter: Secondary | ICD-10-CM | POA: Diagnosis not present

## 2016-07-01 DIAGNOSIS — J101 Influenza due to other identified influenza virus with other respiratory manifestations: Secondary | ICD-10-CM

## 2016-07-01 LAB — POCT RAPID INFLUENZA A&B

## 2016-07-01 MED ORDER — FLUTICASONE PROPIONATE 50 MCG/ACT NA SUSP: 2.0000 | NASAL | 5 refills | Status: DC | PRN

## 2016-07-01 MED ORDER — OSELTAMIVIR PHOSPHATE 30 MG PO CAPS: 30.0000 mg | ORAL_CAPSULE | Freq: Two times a day (BID) | ORAL | 0 refills | Status: DC

## 2016-07-01 MED ORDER — FLUTICASONE PROPIONATE 50 MCG/ACT NA SUSP: 2.0000 | NASAL | 0 refills | Status: DC | PRN

## 2016-07-01 NOTE — Patient Instructions (Signed)
You do have the flu- we are going to treat you with tamiflu. Take it twice a day for 5 days Please let me know if you are not feeling better soon- Sooner if worse.

## 2016-07-01 NOTE — Progress Notes (Signed)
[place] at [facility] [address], [place], [place] [address] 336 [id number] [phone]  Date:  07/01/2016   Name:  [name]   DOB:  1940/09/21   MRN:  [id number]  PCP:  Penni Homans, MD    Chief Complaint: Sinus Problem (c/o sinus headahce, runny nose, sneezing, watery eyes and upper back pain. Sx's started this past weekend. )   History of Present Illness:  ABAGALE MALECKI is a 76 y.o. very pleasant female patient who presents with the following:  Started with a sinus headache on Saturday.  Sneezing a lot.  Water eyes.  Having a sore throat, that is croupy.  Thinks that she has been having some drainage.  She admits to have bad allergies.  Having a hackig cough.  Had a headache this morning, but doesn't;t have one now.  Having maxilary sinus pain & her teeth have been hurting.  Some left ear pain, occasionally.  Had tubes in her ears twice in her younger years.    Also have some back pain.  Feels tired, like she does not feel like going to the gym as she normally does  Feels worse at night.  During the day, it seems that she is improving.  No SOB.  Some CP from coughing.  Aspirin has helped back pain.  Has not been taking Flonase, because she doesn't have it.  Needs a refill of this   Patient Active Problem List   Diagnosis Date Noted  . 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 Post-menopause 09/26/2013  . Preventative health care 04/04/2013  . Osteopenia 04/01/2012  . Allergic state 12/31/2011  . Elevated blood pressure 04/16/2011  . Fibrocystic breast   . UTI (lower urinary tract infection) 03/13/2011  . 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 Prednisone adverse reaction 11/16/2010  . Inner ear disease 11/16/2010  . Pain in ear 11/09/2010  . Eustachian tube dysfunction 11/09/2010  . IBS (irritable bowel syndrome) 09/27/2010  . HYPOTENSION, ORTHOSTATIC 02/26/2010  . PALPITATIONS, RECURRENT 02/26/2010  .  Full incontinence of feces 06/20/2009  . TOBACCO USE, QUIT 03/29/2009  . CHEST PAIN 12/13/2008  . Abdominal pain, epigastric 12/13/2008  . CONSTIPATION 01/12/2008  . SHOULDER PAIN 12/29/2007  . Anxiety state 08/29/2007  . Vitamin D deficiency 05/01/2007  . Hemorrhoids 05/01/2007  . GERD 05/01/2007  . 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  . OSTEOARTHRITIS 01/10/2007    Past Medical History:  Diagnosis Date  . Allergic state   . ANXIETY, CHRONIC   . 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 CHEST PAIN    LHC 02/2011: normal cors, EF 65%  . Diverticulosis   . Esophageal reflux   . Fibrocystic breast   . Hepatic cyst   . HYPOTENSION, ORTHOSTATIC   . IBS (irritable bowel syndrome)   . Internal hemorrhoids without mention of complication   . Kidney lesion   . Liver cyst   . Osteoarthritis   . Osteopenia   . Osteopenia   . Pain in ear   . PALPITATIONS, RECURRENT   . Pancreatitis chronic   . Preventative health care   . PSVT   . 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 SHOULDER PAIN   . TOBACCO USE, QUIT   . VITAMIN D DEFICIENCY     Past Surgical History:  Procedure Laterality Date  . ABDOMINAL HYSTERECTOMY  1998  . BREAST SURGERY     breast bx on left, benign  . CARDIAC CATHETERIZATION    .  CHOLECYSTECTOMY  1999  . [place]  . TUBAL LIGATION     with D&C    Social History  Substance Use Topics  . Smoking status: Former Smoker    Types: Cigarettes    Quit date: 05/27/1973  . Smokeless tobacco: Never Used  . Alcohol use No    Family History  Problem Relation Age of Onset  . Hypertension Mother   . Stroke Mother   . Heart disease Father   . Bladder Cancer Father   . Uterine cancer Paternal Grandmother   . Heart disease Sister   . Heart disease Maternal Grandmother   . Stroke Maternal Grandmother   . Heart disease Maternal Grandfather   . Stroke Maternal Grandfather   . Breast cancer Cousin   . Ovarian cancer Paternal Aunt   . Breast cancer Paternal Aunt    . Pancreatic cancer Paternal Aunt   . Stomach cancer Paternal Uncle   . Breast cancer Paternal Aunt   . Lung cancer Paternal Uncle     Allergies  Allergen Reactions  . Amoxicillin     REACTION: unspecified  . Azithromycin     REACTION: Rash  . Ciprofloxacin     REACTION: GI upset  . Epinephrine     REACTION: unspecified  . Penicillins     REACTION: rash  . Sulfonamide Derivatives     REACTION: sick to stomach    Medication list has been reviewed and updated.  Current Outpatient Prescriptions on File Prior to Visit  Medication Sig Dispense Refill  . Ascorbic Acid (VITAMIN C) 500 MG tablet Take 500 mg by mouth daily.      Marland Kitchen aspirin 81 MG tablet Take 81 mg by mouth 2 (two) times daily.      . cetirizine (ZYRTEC) 10 MG tablet Take 10 mg by mouth daily.      . Cholecalciferol (VITAMIN D3) 1000 UNITS CAPS Take 2 tablets by mouth daily.     Marland Kitchen CINNAMON PO Take by mouth daily.    . Cranberry 500 MG CAPS Take 1 capsule by mouth daily.    Marland Kitchen CREON 24000-76000 units CPEP TAKE 6 CAPSULES BY MOUTH  WITH EACH MEAL AND 1 TO 2  CAPSULES WITH EACH SNACK 2100 capsule 0  . diphenoxylate-atropine (LOMOTIL) 2.5-0.025 MG tablet Take 1 tablet by mouth 4 (four) times daily as needed for diarrhea or loose stools. 360 tablet 3  . escitalopram (LEXAPRO) 10 MG tablet Take 1 tablet (10 mg total) by mouth daily. 90 tablet 3  . estradiol (VIVELLE-DOT) 0.025 MG/24HR Apply 1 patch topically to skin twice weekly. 8 patch 11  . fluticasone (FLONASE) 50 MCG/ACT nasal spray Place 2 sprays into the nose as needed for rhinitis or allergies. 16 g 5  . metroNIDAZOLE (METROCREAM) 0.75 % cream     . NON FORMULARY HORMONE PATCH    . pantoprazole (PROTONIX) 40 MG tablet TAKE 1 TABLET BY MOUTH  DAILY 90 tablet 0  . Probiotic Product (PROBIOTIC PO) Take by mouth. Takes Probaclac probiotic once daily     . ranitidine (ZANTAC) 150 MG tablet Take 1 tablet (150 mg total) by mouth every evening. As needed (Patient taking  differently: Take 150 mg by mouth as needed. As needed) 90 tablet 3  . TURMERIC PO Take by mouth daily.     No current facility-administered medications on file prior to visit.     Review of Systems:  As per HPI- otherwise negative.   Physical Examination: Vitals:  07/01/16 1354  BP: 140/87  Pulse: 95  Temp: 97.9 F (36.6 C)   Vitals:   07/01/16 1354  Weight: 123 lb 9.6 oz (56.1 kg)  Height: 5\' 5"  (1.651 m)   Body mass index is 20.57 kg/m. Ideal Body Weight: Weight in (lb) to have BMI = 25: 149.9  GEN: WDWN, NAD, Non-toxic, A & O x 3, older lady. Slim build, looks well HEENT: Atraumatic, Normocephalic. Neck supple. No masses, No LAD.  Bilateral TM wnl, oropharynx normal.  PEERL,EOMI.   Ears and Nose: No external deformity. CV: RRR, No M/G/R. No JVD. No thrill. No extra heart sounds. PULM: CTA B, no wheezes, crackles, rhonchi. No retractions. No resp. distress. No accessory muscle use. ABD: S, NT, ND EXTR: No c/c/e NEURO Normal gait.  PSYCH: Normally interactive. Conversant. Not depressed or anxious appearing.  Calm demeanor.   Results for orders placed or performed in visit on 07/01/16  POCT Rapid Influenza A&B  Result Value Ref Range   Positive      Assessment and Plan: Influenza A - Plan: oseltamivir (TAMIFLU) 30 MG capsule, fluticasone (FLONASE) 50 MCG/ACT nasal spray, POCT Rapid Influenza A&B, DISCONTINUED: fluticasone (FLONASE) 50 MCG/ACT nasal spray  Allergic state, subsequent encounter - Plan: fluticasone (FLONASE) 50 MCG/ACT nasal spray, DISCONTINUED: fluticasone (FLONASE) 50 MCG/ACT nasal spray  Here today with illness- flu test is positive She would like to have tamiflu- her creat clearance is approx 45.  Will rx 30 mg BID Refilled her flonase Advised her to rest, drink plenty of fluids She will let us know if not feeling better soon- Sooner if worse.    Signed Lamar Blinks, MD

## 2016-07-01 NOTE — Progress Notes (Signed)
Pre visit review using our clinic review tool, if applicable. No additional management support is needed unless otherwise documented below in the visit note. 

## 2016-07-23 ENCOUNTER — Telehealth: Payer: Self-pay | Admitting: *Deleted

## 2016-07-23 NOTE — Telephone Encounter (Signed)
LM for patient to return call to schedule AWV.   

## 2016-08-02 ENCOUNTER — Telehealth: Payer: Self-pay | Admitting: *Deleted

## 2016-08-02 NOTE — Telephone Encounter (Signed)
Read previous note showing pt had declined.

## 2016-08-05 ENCOUNTER — Encounter: Payer: Medicare Other | Admitting: Family Medicine

## 2016-08-05 ENCOUNTER — Telehealth: Payer: Self-pay | Admitting: Family Medicine

## 2016-08-05 NOTE — Telephone Encounter (Signed)
Pt would like to know if she can be worked in sooner. She can come any time of the day. Had to move due to inclement weather. She is now scheduled for 11/18/16. Please advise if you can fit her in sooner. Thank you.

## 2016-08-05 NOTE — Telephone Encounter (Signed)
Can do the same with her a 4:15 on Thursday in April if that does not work will consider a Thursday morning

## 2016-08-06 NOTE — Telephone Encounter (Signed)
Patient scheduled for 09/12/2016 at 11:15am

## 2016-08-08 ENCOUNTER — Encounter: Payer: Self-pay | Admitting: Internal Medicine

## 2016-08-20 ENCOUNTER — Ambulatory Visit (INDEPENDENT_AMBULATORY_CARE_PROVIDER_SITE_OTHER): Payer: Medicare Other | Admitting: Internal Medicine

## 2016-08-20 ENCOUNTER — Encounter: Payer: Self-pay | Admitting: Internal Medicine

## 2016-08-20 VITALS — BP 142/86 | HR 77 | Ht 65.0 in | Wt 124.4 lb

## 2016-08-20 DIAGNOSIS — I471 Supraventricular tachycardia: Secondary | ICD-10-CM | POA: Diagnosis not present

## 2016-08-20 DIAGNOSIS — I951 Orthostatic hypotension: Secondary | ICD-10-CM

## 2016-08-20 NOTE — Progress Notes (Signed)
Patient Care Team: Mosie Lukes, MD as PCP - General (Family Medicine) Deboraha Sprang, MD as Consulting Physician (Cardiology) Jerene Bears, MD as Consulting Physician (Gastroenterology) Rolm Bookbinder, MD as Consulting Physician (Dermatology) Alphonsa Overall, MD as Consulting Physician (General Surgery) Melissa Montane, MD as Consulting Physician (Otolaryngology)   HPI  [name] is a 76 y.o. female Seen in followup. She has a history of recurrent chest pain. Myoview scanning 2010 was normal. Because of recurrent chest pain, 2012 she underwent catheterization that was normal  She has had some problems with orthostatic intolerance which is worse in the summer. She tries to maintain volume repletion-- this is the same as it was last year  She and her husband travel extensively  She has  Had few palpitations        Past Medical History:  Diagnosis Date  . Allergic state   . ANXIETY, CHRONIC   . 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 CHEST PAIN    LHC 02/2011: normal cors, EF 65%  . Diverticulosis   . Esophageal reflux   . Fibrocystic breast   . Hepatic cyst   . HYPOTENSION, ORTHOSTATIC   . IBS (irritable bowel syndrome)   . Internal hemorrhoids without mention of complication   . Kidney lesion   . Liver cyst   . Osteoarthritis   . Osteopenia   . Osteopenia   . Pain in ear   . PALPITATIONS, RECURRENT   . Pancreatitis chronic   . Preventative health care   . PSVT   . 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 SHOULDER PAIN   . TOBACCO USE, QUIT   . VITAMIN D DEFICIENCY     Past Surgical History:  Procedure Laterality Date  . ABDOMINAL HYSTERECTOMY  1998  . BREAST SURGERY     breast bx on left, benign  . CARDIAC CATHETERIZATION    . CHOLECYSTECTOMY  1999  . [place]  . TUBAL LIGATION     with D&C    Current Outpatient Prescriptions  Medication Sig Dispense Refill  . Ascorbic Acid (VITAMIN C) 500 MG tablet Take 500 mg by mouth daily.      Marland Kitchen  aspirin 81 MG tablet Take 81 mg by mouth 2 (two) times daily.      . cetirizine (ZYRTEC) 10 MG tablet Take 10 mg by mouth daily.      . Cholecalciferol (VITAMIN D3) 1000 UNITS CAPS Take 2 tablets by mouth daily.     Marland Kitchen CINNAMON PO Take by mouth daily.    . Cranberry 500 MG CAPS Take 1 capsule by mouth daily.    Marland Kitchen CREON 24000-76000 units CPEP TAKE 6 CAPSULES BY MOUTH  WITH EACH MEAL AND 1 TO 2  CAPSULES WITH EACH SNACK 2100 capsule 0  . diphenoxylate-atropine (LOMOTIL) 2.5-0.025 MG tablet Take 1 tablet by mouth 4 (four) times daily as needed for diarrhea or loose stools. 360 tablet 3  . escitalopram (LEXAPRO) 10 MG tablet Take 1 tablet (10 mg total) by mouth daily. 90 tablet 3  . estradiol (VIVELLE-DOT) 0.025 MG/24HR Apply 1 patch topically to skin twice weekly. 8 patch 11  . fluticasone (FLONASE) 50 MCG/ACT nasal spray Place 2 sprays into both nostrils as needed for rhinitis or allergies. 16 g 0  . metroNIDAZOLE (METROCREAM) 0.75 % cream     . NON FORMULARY HORMONE PATCH    . oseltamivir (TAMIFLU) 30 MG capsule Take 1 capsule (30 mg total) by mouth 2 (  two) times daily. 10 capsule 0  . pantoprazole (PROTONIX) 40 MG tablet TAKE 1 TABLET BY MOUTH  DAILY 90 tablet 0  . Probiotic Product (PROBIOTIC PO) Take by mouth. Takes Probaclac probiotic once daily     . ranitidine (ZANTAC) 150 MG tablet Take 1 tablet (150 mg total) by mouth every evening. As needed (Patient taking differently: Take 150 mg by mouth as needed. As needed) 90 tablet 3  . TURMERIC PO Take by mouth daily.     No current facility-administered medications for this visit.     Allergies  Allergen Reactions  . Amoxicillin     REACTION: unspecified  . Azithromycin     REACTION: Rash  . Ciprofloxacin     REACTION: GI upset  . Epinephrine     REACTION: unspecified  . Penicillins     REACTION: rash  . Sulfonamide Derivatives     REACTION: sick to stomach    Review of Systems negative except from HPI and PMH  Physical Exam BP  (!) 142/86 (BP Location: Left Arm, Patient Position: Sitting, Cuff Size: Normal)   Pulse 77   Ht 5\' 5"  (1.651 m)   Wt 124 lb 6.4 oz (56.4 kg)   SpO2 99%   BMI 20.70 kg/m  Well developed and nourished in no acute distress HENT normal Neck supple with JVP-flat Clear Regular rate and rhythm, no murmurs or gallops Abd-soft with active BS No Clubbing cyanosis edema Skin-warm and dry A & Oriented  Grossly normal sensory and motor function   ECG  Sinus at 75 Intervals 13/08/41     Assessment and  Plan  Hypertension well controlled  Although was a little elevated here.  Atrial Tach-- no significant recurrences  Orthostatic intolerance    Reviewed the issues related to his heat and hydration.   She is quite comfortable with these strategies

## 2016-08-20 NOTE — Patient Instructions (Addendum)

## 2016-09-11 NOTE — Progress Notes (Signed)
Subjective:   [name] is a 76 y.o. female who presents for an Initial Medicare Annual Wellness Visit.  The Patient was informed that the wellness visit is to identify future health risk and educate and initiate measures that can reduce risk for increased disease through the lifespan.   Describes health as fair, good or great? "How I feel is great, but I know I have some health issues that could be very serious."  Review of Systems    No ROS.  Medicare Wellness Visit.  Cardiac Risk Factors include: advanced age (>42men, >60 women);hypertension  Home Safety/Smoke Alarms: Feels safe in home. Living environment; residence and Firearm Safety: Lives w/ husband. They are very active and travel frequently. No safety issues identified.   Counseling:   Dental- Follows w/ dentist regularly.  Female:   Pap- N/A, hysterectomy      Mammo- last 05/24/16. BI-RADS CATEGORY 2: Benign       Dexa scan- last 08/15/14. Osteoporosis.      CCS- last 01/27/08. Diverticulosis, internal hemorrhoids. 10 year recall.     Objective:    Today's Vitals   09/12/16 1129  BP: (!) 142/92  Pulse: 74  Resp: 18  Temp: 98.1 F (36.7 C)  TempSrc: Oral  SpO2: 97%  Weight: 123 lb 3.2 oz (55.9 kg)  Height: 5\' 5"  (1.651 m)   Body mass index is 20.5 kg/m.   Current Medications (verified) Outpatient Encounter Prescriptions as of 09/12/2016  Medication Sig  . Ascorbic Acid (VITAMIN C) 500 MG tablet Take 500 mg by mouth daily.    Marland Kitchen aspirin 81 MG tablet Take 81 mg by mouth 2 (two) times daily.    . cetirizine (ZYRTEC) 10 MG tablet Take 10 mg by mouth daily.    . Cholecalciferol (VITAMIN D3) 1000 UNITS CAPS Take 2 tablets by mouth daily.   Marland Kitchen CINNAMON PO Take by mouth daily.  . Cranberry 500 MG CAPS Take 1 capsule by mouth daily.  Marland Kitchen CREON 24000-76000 units CPEP TAKE 6 CAPSULES BY MOUTH  WITH EACH MEAL AND 1 TO 2  CAPSULES WITH EACH SNACK  . diphenoxylate-atropine (LOMOTIL) 2.5-0.025 MG tablet Take 1 tablet by  mouth 4 (four) times daily as needed for diarrhea or loose stools.  Marland Kitchen escitalopram (LEXAPRO) 10 MG tablet Take 1 tablet (10 mg total) by mouth daily.  Marland Kitchen estradiol (VIVELLE-DOT) 0.025 MG/24HR Apply 1 patch topically to skin twice weekly.  . fluticasone (FLONASE) 50 MCG/ACT nasal spray Place 2 sprays into both nostrils as needed for rhinitis or allergies.  . metroNIDAZOLE (METROCREAM) 0.75 % cream   . NON FORMULARY HORMONE PATCH  . oseltamivir (TAMIFLU) 30 MG capsule Take 1 capsule (30 mg total) by mouth 2 (two) times daily.  . pantoprazole (PROTONIX) 40 MG tablet Take 1 tablet (40 mg total) by mouth daily.  . Probiotic Product (PROBIOTIC PO) Take by mouth. Takes Probaclac probiotic once daily   . ranitidine (ZANTAC) 150 MG tablet Take 1 tablet (150 mg total) by mouth every evening. As needed (Patient taking differently: Take 150 mg by mouth as needed. As needed)  . TURMERIC PO Take by mouth daily.  . [DISCONTINUED] escitalopram (LEXAPRO) 10 MG tablet Take 1 tablet (10 mg total) by mouth daily.  . [DISCONTINUED] estradiol (VIVELLE-DOT) 0.025 MG/24HR Apply 1 patch topically to skin twice weekly.  . [DISCONTINUED] pantoprazole (PROTONIX) 40 MG tablet TAKE 1 TABLET BY MOUTH  DAILY  . tetanus & diphtheria toxoids, adult, (TENIVAC) 5-2 LFU injection Inject 0.5  mLs into the muscle once.  . Zoster Vac Recomb Adjuvanted [facility]) injection Inject 0.5 mLs into the muscle once.   No facility-administered encounter medications on file as of 09/12/2016.     Allergies (verified) Amoxicillin; Azithromycin; Ciprofloxacin; Epinephrine; Penicillins; Sulfonamide derivatives; and Tamiflu [oseltamivir phosphate]   History: Past Medical History:  Diagnosis Date  . Allergic state   . ANXIETY, CHRONIC   . 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 CHEST PAIN    LHC 02/2011: normal cors, EF 65%  . Diverticulosis   . Esophageal reflux   . Fibrocystic breast   . Hepatic cyst   . HYPOTENSION, ORTHOSTATIC     . IBS (irritable bowel syndrome)   . Internal hemorrhoids without mention of complication   . Kidney lesion   . Liver cyst   . Osteoarthritis   . Osteopenia   . Osteopenia   . Pain in ear   . PALPITATIONS, RECURRENT   . Pancreatitis chronic   . Preventative health care   . PSVT   . 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 SHOULDER PAIN   . TOBACCO USE, QUIT   . VITAMIN D DEFICIENCY    Past Surgical History:  Procedure Laterality Date  . ABDOMINAL HYSTERECTOMY  1998  . BREAST SURGERY     breast bx on left, benign  . CARDIAC CATHETERIZATION    . CHOLECYSTECTOMY  1999  . [place]  . TUBAL LIGATION     with D&C   Family History  Problem Relation Age of Onset  . Hypertension Mother   . Stroke Mother   . Heart disease Father   . Bladder Cancer Father   . Uterine cancer Paternal Grandmother   . Heart disease Sister   . Heart disease Maternal Grandmother   . Stroke Maternal Grandmother   . Heart disease Maternal Grandfather   . Stroke Maternal Grandfather   . Breast cancer Cousin   . Ovarian cancer Paternal Aunt   . Breast cancer Paternal Aunt   . Pancreatic cancer Paternal Aunt   . Stomach cancer Paternal Uncle   . Breast cancer Paternal Aunt   . Lung cancer Paternal Uncle    Social History   Occupational History  . Retired Retired   Social History Main Topics  . Smoking status: Former Smoker    Types: Cigarettes    Quit date: 05/27/1973  . Smokeless tobacco: Never Used  . Alcohol use No  . Drug use: No  . Sexual activity: Yes    Tobacco Counseling Counseling given: Not Answered   Activities of Daily Living In your present state of health, do you have any difficulty performing the following activities: 09/12/2016  Hearing? N  Vision? N  Difficulty concentrating or making decisions? N  Walking or climbing stairs? N  Dressing or bathing? N  Doing errands, shopping? N  Preparing Food and eating ? N  Using the Toilet? N  In the past six  months, have you accidently leaked urine? Y  Do you have problems with loss of bowel control? Y  Managing your Medications? N  Managing your Finances? N  Housekeeping or managing your Housekeeping? N  Some recent data might be hidden    Immunizations and Health Maintenance Immunization History  Administered Date(s) Administered  . Influenza Split 04/02/2011, 04/01/2012  . Influenza Whole 06/28/2005, 03/25/2008, 03/29/2009, 03/07/2010  . Influenza, High Dose Seasonal PF 04/02/2013, 04/14/2014, 03/31/2015  . Influenza,inj,Quad PF,36+ Mos 03/19/2016  . Pneumococcal Conjugate-13 09/24/2013  .  Pneumococcal Polysaccharide-23 05/01/2007  . Tdap 05/27/2006  . Zoster 09/01/2007   Health Maintenance Due  Topic Date Due  . TETANUS/TDAP  05/27/2016    Patient Care Team: Mosie Lukes, MD as PCP - General (Family Medicine) Deboraha Sprang, MD as Consulting Physician (Cardiology) Jerene Bears, MD as Consulting Physician (Gastroenterology) Rolm Bookbinder, MD as Consulting Physician (Dermatology) Alphonsa Overall, MD as Consulting Physician (General Surgery) Melissa Montane, MD as Consulting Physician (Otolaryngology) Luberta Mutter, MD as Consulting Physician (Ophthalmology)  Indicate any recent Medical Services you may have received from other than [facility] providers in the past year (date may be approximate).     Assessment:   This is a routine wellness examination for [place]. Physical assessment deferred to PCP.  Hearing/Vision screen Hearing Screening Comments: Able to hear conversational tones w/o difficulty. No issues reported. Passed whisper word. Vision Screening Comments: Bilateral cataracts. Passed vision screening. Follows w/ Dr. Ellie Lunch.  Dietary issues and exercise activities discussed: Current Exercise Habits: Structured exercise class, Frequency (Times/Week): 4, Intensity: Moderate. Stationary bike 4 days/week. Arm machine, seated elliptical.  Diet (meal preparation, eat out, water  intake, caffeinated beverages, dairy products, fruits and vegetables): in general, a "healthy" diet  , well balanced, on average, 5 small meals per day. Eats "healthy" if eating at home, "junk food" if eating out. Eats out frequently on the weekends.Tries to follow diet given by Dr. Hilarie Fredrickson for pancreatitis.      Goals    . Healthy Lifestyle      Depression Screen PHQ 2/9 Scores 09/12/2016 08/03/2015 08/01/2014 04/04/2013  PHQ - 2 Score 0 0 0 0    Fall Risk Fall Risk  09/12/2016 08/03/2015 08/01/2014 04/04/2013  Falls in the past year? No No No No    Cognitive Function: MMSE - Mini Mental State Exam 09/12/2016  Orientation to time 5  Orientation to Place 5  Registration 3  Attention/ Calculation 5  Recall 3  Language- name 2 objects 2  Language- repeat 1  Language- follow 3 step command 3  Language- read & follow direction 1  Write a sentence 1  Copy design 1  Total score 30        Screening Tests Health Maintenance  Topic Date Due  . TETANUS/TDAP  05/27/2016  . INFLUENZA VACCINE  12/25/2016  . DEXA SCAN  Completed  . PNA vac Low Risk Adult  Completed      Plan:    Follow-up w/ PCP as directed.  She has bone density scan scheduled for December of this year.  Bring a copy of your advance directives to your next office visit.  Rxs provided by PCP for tetanus and Shingrix.  During the course of the visit, C[place]ney was educated and counseled about the following appropriate screening and preventive services:   Vaccines to include Pneumococcal, Influenza, Td, HCV  Cardiovascular disease screening  Colorectal cancer screening  Bone density screening  Diabetes screening  Glaucoma screening  Mammography  Nutrition counseling  Patient Instructions (the written plan) were given to the patient.    Dorrene German, RN   09/12/2016

## 2016-09-12 ENCOUNTER — Encounter (INDEPENDENT_AMBULATORY_CARE_PROVIDER_SITE_OTHER): Payer: Self-pay

## 2016-09-12 ENCOUNTER — Encounter: Payer: Self-pay | Admitting: Family Medicine

## 2016-09-12 ENCOUNTER — Ambulatory Visit (INDEPENDENT_AMBULATORY_CARE_PROVIDER_SITE_OTHER): Payer: Medicare Other | Admitting: Family Medicine

## 2016-09-12 VITALS — BP 142/92 | HR 74 | Temp 98.1°F | Resp 18 | Ht 65.0 in | Wt 123.2 lb

## 2016-09-12 DIAGNOSIS — Z0000 Encounter for general adult medical examination without abnormal findings: Secondary | ICD-10-CM | POA: Diagnosis not present

## 2016-09-12 DIAGNOSIS — E559 Vitamin D deficiency, unspecified: Secondary | ICD-10-CM

## 2016-09-12 DIAGNOSIS — F419 Anxiety disorder, unspecified: Secondary | ICD-10-CM | POA: Diagnosis not present

## 2016-09-12 DIAGNOSIS — K861 Other chronic pancreatitis: Secondary | ICD-10-CM

## 2016-09-12 DIAGNOSIS — I951 Orthostatic hypotension: Secondary | ICD-10-CM

## 2016-09-12 LAB — CBC
HEMATOCRIT: 40.4 % (ref 36.0–46.0)
Hemoglobin: 13.6 g/dL (ref 12.0–15.0)
MCHC: 33.5 g/dL (ref 30.0–36.0)
MCV: 91.4 fl (ref 78.0–100.0)
PLATELETS: 253 10*3/uL (ref 150.0–400.0)
RBC: 4.42 Mil/uL (ref 3.87–5.11)
RDW: 13.9 % (ref 11.5–15.5)
WBC: 7.7 10*3/uL (ref 4.0–10.5)

## 2016-09-12 LAB — LIPID PANEL
CHOL/HDL RATIO: 1
Cholesterol: 146 mg/dL (ref 0–200)
HDL: 102.3 mg/dL (ref >39.00)
LDL CALC: 38 mg/dL (ref 0–99)
NONHDL: 43.33
Triglycerides: 26 mg/dL (ref 0.0–149.0)
VLDL: 5.2 mg/dL (ref 0.0–40.0)

## 2016-09-12 LAB — COMPREHENSIVE METABOLIC PANEL
ALT: 15 U/L (ref 0–35)
AST: 19 U/L (ref 0–37)
Albumin: 4.1 g/dL (ref 3.5–5.2)
Alkaline Phosphatase: 56 U/L (ref 39–117)
BUN: 18 mg/dL (ref 6–23)
CHLORIDE: 105 meq/L (ref 96–112)
CO2: 27 mEq/L (ref 19–32)
Calcium: 9.1 mg/dL (ref 8.4–10.5)
Creatinine, Ser: 0.87 mg/dL (ref 0.40–1.20)
GFR: 67.18 mL/min (ref >60.00)
GLUCOSE: 82 mg/dL (ref 70–99)
POTASSIUM: 4.1 meq/L (ref 3.5–5.1)
SODIUM: 140 meq/L (ref 135–145)
Total Bilirubin: 0.4 mg/dL (ref 0.2–1.2)
Total Protein: 6.5 g/dL (ref 6.0–8.3)

## 2016-09-12 LAB — LIPASE: Lipase: 62 U/L — ABNORMAL HIGH (ref 11.0–59.0)

## 2016-09-12 LAB — TSH: TSH: 2.03 u[IU]/mL (ref 0.35–4.50)

## 2016-09-12 LAB — VITAMIN D 25 HYDROXY (VIT D DEFICIENCY, FRACTURES): VITD: 66.33 ng/mL (ref 30.00–100.00)

## 2016-09-12 LAB — AMYLASE: Amylase: 79 U/L (ref 27–131)

## 2016-09-12 MED ORDER — PANTOPRAZOLE SODIUM 40 MG PO TBEC: 40.0000 mg | DELAYED_RELEASE_TABLET | Freq: Every day | ORAL | 3 refills | Status: DC

## 2016-09-12 MED ORDER — TETANUS-DIPHTHERIA TOXOIDS TD 5-2 LFU IM INJ: 0.5000 mL | INJECTION | Freq: Once | INTRAMUSCULAR | 0 refills | Status: AC

## 2016-09-12 MED ORDER — ZOSTER VAC RECOMB ADJUVANTED 50 MCG/0.5ML IM SUSR: 0.5000 mL | Freq: Once | INTRAMUSCULAR | 1 refills | Status: AC

## 2016-09-12 MED ORDER — ESTRADIOL 0.025 MG/24HR TD PTTW: MEDICATED_PATCH | TRANSDERMAL | 11 refills | Status: DC

## 2016-09-12 MED ORDER — ESCITALOPRAM OXALATE 10 MG PO TABS: 10.0000 mg | ORAL_TABLET | Freq: Every day | ORAL | 3 refills | Status: DC

## 2016-09-12 NOTE — Assessment & Plan Note (Addendum)
Follows with Dr Hilarie Fredrickson. When symptoms develop she takes meds prn and theses are helpful when diarrhea develops. She is following a standartd diet and that has been helpful  Triggered by dairy, salads, tomatos, cucumbers, cabbage, green beans. Broccoli and more Lipase continues to be elevated.

## 2016-09-12 NOTE — Assessment & Plan Note (Signed)
Taking Vitamin D 1000 IU daily

## 2016-09-12 NOTE — Progress Notes (Signed)
Subjective:  I acted as a Education administrator for Dr. Charlett Blake. [name], [place]   Patient ID: [name], female    DOB: 1940-09-24, 76 y.o.   MRN: [id number]  Chief Complaint  Patient presents with  . Annual Exam    HPI  Patient is in today for an annual exam, following up on hypertension, vitamin d, and other medical concerns. 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 Dr. Hilarie Fredrickson is following. She also stated she also struggles with IBS. She has intermittent loose stool but no bloody or tarry stool. No recent febrile illness or hospitalizations. She tries to avoid symptoms by being very careful with her diet. Denies CP/palp/SOB/HA/congestion/fevers/GI or GU c/o. Taking meds as prescribed   Patient Care Team: Mosie Lukes, MD as PCP - General (Family Medicine) Deboraha Sprang, MD as Consulting Physician (Cardiology) Jerene Bears, MD as Consulting Physician (Gastroenterology) Rolm Bookbinder, MD as Consulting Physician (Dermatology) Alphonsa Overall, MD as Consulting Physician (General Surgery) Melissa Montane, MD as Consulting Physician (Otolaryngology) Luberta Mutter, MD as Consulting Physician (Ophthalmology)   Past Medical History:  Diagnosis Date  . Allergic state   . ANXIETY, CHRONIC   . 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 CHEST PAIN    LHC 02/2011: normal cors, EF 65%  . Diverticulosis   . Esophageal reflux   . Fibrocystic breast   . Hepatic cyst   . HYPOTENSION, ORTHOSTATIC   . IBS (irritable bowel syndrome)   . Internal hemorrhoids without mention of complication   . Kidney lesion   . Liver cyst   . Osteoarthritis   . Osteopenia   . Osteopenia   . Pain in ear   . PALPITATIONS, RECURRENT   . Pancreatitis chronic   . Preventative health care   . PSVT   . 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 SHOULDER PAIN   . TOBACCO USE, QUIT   . VITAMIN D DEFICIENCY     Past Surgical History:  Procedure Laterality Date  . ABDOMINAL HYSTERECTOMY  1998  . BREAST  SURGERY     breast bx on left, benign  . CARDIAC CATHETERIZATION    . CHOLECYSTECTOMY  1999  . [place]  . TUBAL LIGATION     with D&C    Family History  Problem Relation Age of Onset  . Hypertension Mother   . Stroke Mother   . Heart disease Father   . Bladder Cancer Father   . Uterine cancer Paternal Grandmother   . Heart disease Sister   . Heart disease Maternal Grandmother   . Stroke Maternal Grandmother   . Heart disease Maternal Grandfather   . Stroke Maternal Grandfather   . Breast cancer Cousin   . Ovarian cancer Paternal Aunt   . Breast cancer Paternal Aunt   . Pancreatic cancer Paternal Aunt   . Stomach cancer Paternal Uncle   . Breast cancer Paternal Aunt   . Lung cancer Paternal Uncle     Social History   Social History  . Marital status: Married    Spouse name: N/A  . Number of children: 1  . Years of education: N/A   Occupational History  . Retired Retired   Social History Main Topics  . Smoking status: Former Smoker    Types: Cigarettes    Quit date: 05/27/1973  . Smokeless tobacco: Never Used  . Alcohol use No  . Drug use: No  . Sexual activity: Yes   Other Topics Concern  .  Not on file   Social History Narrative  . No narrative on file    Outpatient Medications Prior to Visit  Medication Sig Dispense Refill  . Ascorbic Acid (VITAMIN C) 500 MG tablet Take 500 mg by mouth daily.      Marland Kitchen aspirin 81 MG tablet Take 81 mg by mouth 2 (two) times daily.      . cetirizine (ZYRTEC) 10 MG tablet Take 10 mg by mouth daily.      . Cholecalciferol (VITAMIN D3) 1000 UNITS CAPS Take 2 tablets by mouth daily.     Marland Kitchen CINNAMON PO Take by mouth daily.    . Cranberry 500 MG CAPS Take 1 capsule by mouth daily.    Marland Kitchen CREON 24000-76000 units CPEP TAKE 6 CAPSULES BY MOUTH  WITH EACH MEAL AND 1 TO 2  CAPSULES WITH EACH SNACK 2100 capsule 0  . diphenoxylate-atropine (LOMOTIL) 2.5-0.025 MG tablet Take 1 tablet by mouth 4 (four) times daily as needed for  diarrhea or loose stools. 360 tablet 3  . fluticasone (FLONASE) 50 MCG/ACT nasal spray Place 2 sprays into both nostrils as needed for rhinitis or allergies. 16 g 0  . metroNIDAZOLE (METROCREAM) 0.75 % cream     . NON FORMULARY HORMONE PATCH    . oseltamivir (TAMIFLU) 30 MG capsule Take 1 capsule (30 mg total) by mouth 2 (two) times daily. 10 capsule 0  . Probiotic Product (PROBIOTIC PO) Take by mouth. Takes Probaclac probiotic once daily     . ranitidine (ZANTAC) 150 MG tablet Take 1 tablet (150 mg total) by mouth every evening. As needed (Patient taking differently: Take 150 mg by mouth as needed. As needed) 90 tablet 3  . TURMERIC PO Take by mouth daily.    Marland Kitchen escitalopram (LEXAPRO) 10 MG tablet Take 1 tablet (10 mg total) by mouth daily. 90 tablet 3  . estradiol (VIVELLE-DOT) 0.025 MG/24HR Apply 1 patch topically to skin twice weekly. 8 patch 11  . pantoprazole (PROTONIX) 40 MG tablet TAKE 1 TABLET BY MOUTH  DAILY 90 tablet 0   No facility-administered medications prior to visit.     Allergies  Allergen Reactions  . Amoxicillin     REACTION: unspecified  . Azithromycin     REACTION: Rash  . Ciprofloxacin     REACTION: GI upset  . Epinephrine     REACTION: unspecified  . Penicillins     REACTION: rash  . Sulfonamide Derivatives     REACTION: sick to stomach  . Tamiflu [Oseltamivir Phosphate] Hives    Review of Systems  Constitutional: Negative for fever and malaise/fatigue.  HENT: Negative for congestion.   Eyes: Negative for blurred vision.  Respiratory: Negative for cough and shortness of breath.   Cardiovascular: Negative for chest pain, palpitations and leg swelling.  Gastrointestinal: Negative for vomiting.  Musculoskeletal: Negative for back pain.  Skin: Negative for rash.  Neurological: Negative for loss of consciousness and headaches.       Objective:    Physical Exam  Constitutional: She is oriented to person, place, and time. She appears well-developed and  well-nourished. No distress.  HENT:  Head: Normocephalic and atraumatic.  Eyes: Conjunctivae are normal.  Neck: Normal range of motion. No thyromegaly present.  Cardiovascular: Normal rate and regular rhythm.   Pulmonary/Chest: Effort normal and breath sounds normal. She has no wheezes.  Abdominal: Soft. Bowel sounds are normal. There is no tenderness.  Musculoskeletal: Normal range of motion. She exhibits no edema or deformity.  Neurological:  She is alert and oriented to person, place, and time.  Skin: Skin is warm and dry. She is not diaphoretic.  Psychiatric: She has a normal mood and affect.    BP (!) 142/92 (BP Location: Left Arm, Patient Position: Sitting, Cuff Size: Normal)   Pulse 74   Temp 98.1 F (36.7 C) (Oral)   Resp 18   Ht 5\' 5"  (1.651 m)   Wt 123 lb 3.2 oz (55.9 kg)   SpO2 97%   BMI 20.50 kg/m  Wt Readings from Last 3 Encounters:  09/12/16 123 lb 3.2 oz (55.9 kg)  08/20/16 124 lb 6.4 oz (56.4 kg)  07/01/16 123 lb 9.6 oz (56.1 kg)   BP Readings from Last 3 Encounters:  09/12/16 (!) 142/92  08/20/16 (!) 142/86  07/01/16 140/87     Immunization History  Administered Date(s) Administered  . Influenza Split 04/02/2011, 04/01/2012  . Influenza Whole 06/28/2005, 03/25/2008, 03/29/2009, 03/07/2010  . Influenza, High Dose Seasonal PF 04/02/2013, 04/14/2014, 03/31/2015  . Influenza,inj,Quad PF,36+ Mos 03/19/2016  . Pneumococcal Conjugate-13 09/24/2013  . Pneumococcal Polysaccharide-23 05/01/2007  . Tdap 05/27/2006  . Zoster 09/01/2007    Health Maintenance  Topic Date Due  . TETANUS/TDAP  05/27/2016  . INFLUENZA VACCINE  12/25/2016  . DEXA SCAN  Completed  . PNA vac Low Risk Adult  Completed    Lab Results  Component Value Date   WBC 7.7 09/12/2016   HGB 13.6 09/12/2016   HCT 40.4 09/12/2016   PLT 253.0 09/12/2016   GLUCOSE 82 09/12/2016   CHOL 146 09/12/2016   TRIG 26.0 09/12/2016   HDL 102.30 09/12/2016   LDLCALC 38 09/12/2016   ALT 15  09/12/2016   AST 19 09/12/2016   NA 140 09/12/2016   K 4.1 09/12/2016   CL 105 09/12/2016   CREATININE 0.87 09/12/2016   BUN 18 09/12/2016   CO2 27 09/12/2016   TSH 2.03 09/12/2016   INR 1.00 03/22/2011    Lab Results  Component Value Date   TSH 2.03 09/12/2016   Lab Results  Component Value Date   WBC 7.7 09/12/2016   HGB 13.6 09/12/2016   HCT 40.4 09/12/2016   MCV 91.4 09/12/2016   PLT 253.0 09/12/2016   Lab Results  Component Value Date   NA 140 09/12/2016   K 4.1 09/12/2016   CO2 27 09/12/2016   GLUCOSE 82 09/12/2016   BUN 18 09/12/2016   CREATININE 0.87 09/12/2016   BILITOT 0.4 09/12/2016   ALKPHOS 56 09/12/2016   AST 19 09/12/2016   ALT 15 09/12/2016   PROT 6.5 09/12/2016   ALBUMIN 4.1 09/12/2016   CALCIUM 9.1 09/12/2016   GFR 67.18 09/12/2016   Lab Results  Component Value Date   CHOL 146 09/12/2016   Lab Results  Component Value Date   HDL 102.30 09/12/2016   Lab Results  Component Value Date   LDLCALC 38 09/12/2016   Lab Results  Component Value Date   TRIG 26.0 09/12/2016   Lab Results  Component Value Date   CHOLHDL 1 09/12/2016   No results found for: HGBA1C       Assessment & Plan:   Problem List Items Addressed This Visit    Vitamin D deficiency - Primary    Taking Vitamin D 1000 IU daily      Relevant Orders   VITAMIN D 25 Hydroxy (Vit-D Deficiency, Fractures) (Completed)   HYPOTENSION, ORTHOSTATIC   Autoimmune pancreatitis ([place])    Follows with Dr Hilarie Fredrickson. When  symptoms develop she takes meds prn and theses are helpful when diarrhea develops. She is following a standartd diet and that has been helpful  Triggered by dairy, salads, tomatos, cucumbers, cabbage, green beans. Broccoli and more Lipase continues to be elevated.       Relevant Medications   pantoprazole (PROTONIX) 40 MG tablet   Other Relevant Orders   Lipase (Completed)   Amylase (Completed)   Preventative health care    Patient encouraged to maintain  heart healthy diet, regular exercise, adequate sleep. Consider daily probiotics. Take medications as prescribed      Relevant Orders   CBC (Completed)   Lipid panel (Completed)   TSH (Completed)   Comprehensive metabolic panel (Completed)   VITAMIN D 25 Hydroxy (Vit-D Deficiency, Fractures) (Completed)    Other Visit Diagnoses    Anxiety       Relevant Medications   escitalopram (LEXAPRO) 10 MG tablet   Encounter for Medicare annual wellness exam          I have changed Ms. Jalbert's pantoprazole. I am also having her start on Zoster Vac Recomb Adjuvanted and tetanus & diphtheria toxoids (adult). Additionally, I am having her maintain her vitamin C, Vitamin D3, cetirizine, Probiotic Product (PROBIOTIC PO), aspirin, CINNAMON PO, Cranberry, diphenoxylate-atropine, ranitidine, TURMERIC PO, NON FORMULARY, metroNIDAZOLE, CREON, oseltamivir, fluticasone, estradiol, and escitalopram.  Meds ordered this encounter  Medications  . estradiol (VIVELLE-DOT) 0.025 MG/24HR    Sig: Apply 1 patch topically to skin twice weekly.    Dispense:  24 patch    Refill:  11  . escitalopram (LEXAPRO) 10 MG tablet    Sig: Take 1 tablet (10 mg total) by mouth daily.    Dispense:  90 tablet    Refill:  3  . pantoprazole (PROTONIX) 40 MG tablet    Sig: Take 1 tablet (40 mg total) by mouth daily.    Dispense:  90 tablet    Refill:  3  . Zoster Vac Recomb Adjuvanted [facility]) injection    Sig: Inject 0.5 mLs into the muscle once.    Dispense:  0.5 mL    Refill:  1  . tetanus & diphtheria toxoids, adult, (TENIVAC) 5-2 LFU injection    Sig: Inject 0.5 mLs into the muscle once.    Dispense:  0.5 mL    Refill:  0    CMA served as scribe during this visit. History, Physical and Plan performed by medical provider. Documentation and orders reviewed and attested to.  Penni Homans, MD

## 2016-09-12 NOTE — Assessment & Plan Note (Signed)
Patient encouraged to maintain heart healthy diet, regular exercise, adequate sleep. Consider daily probiotics. Take medications as prescribed 

## 2016-09-12 NOTE — Patient Instructions (Signed)
Hydrate Hydrate Hydrate 64 ounces of water a day    Cholesterol Cholesterol is a white, waxy, fat-like substance that is needed by the human body in small amounts. The liver makes all the cholesterol we need. Cholesterol is carried from the liver by the blood through the blood vessels. Deposits of cholesterol (plaques) may build up on blood vessel (artery) walls. Plaques make the arteries narrower and stiffer. Cholesterol plaques increase the risk for heart attack and stroke. You cannot feel your cholesterol level even if it is very high. The only way to know that it is high is to have a blood test. Once you know your cholesterol levels, you should keep a record of the test results. Work with your health care provider to keep your levels in the desired range. What do the results mean?  Total cholesterol is a rough measure of all the cholesterol in your blood.  LDL (low-density lipoprotein) is the "bad" cholesterol. This is the type that causes plaque to build up on the artery walls. You want this level to be low.  HDL (high-density lipoprotein) is the "good" cholesterol because it cleans the arteries and carries the LDL away. You want this level to be high.  Triglycerides are fat that the body can either burn for energy or store. High levels are closely linked to heart disease. What are the desired levels of cholesterol?  Total cholesterol below 200.  LDL below 100 for people who are at risk, below 70 for people at very high risk.  HDL above 40 is good. A level of 60 or higher is considered to be protective against heart disease.  Triglycerides below 150. How can I lower my cholesterol? Diet  Follow your diet program as told by your health care provider.  Choose fish or white meat chicken and [place], roasted or baked. Limit fatty cuts of red meat, fried foods, and processed meats, such as sausage and lunch meats.  Eat lots of fresh fruits and vegetables.  Choose whole grains, beans,  pasta, potatoes, and cereals.  Choose olive oil, corn oil, or canola oil, and use only small amounts.  Avoid butter, mayonnaise, shortening, or palm kernel oils.  Avoid foods with trans fats.  Drink skim or nonfat milk and eat low-fat or nonfat yogurt and cheeses. Avoid whole milk, cream, ice cream, egg yolks, and full-fat cheeses.  Healthier desserts include angel food cake, ginger snaps, animal crackers, hard candy, popsicles, and low-fat or nonfat frozen yogurt. Avoid pastries, cakes, pies, and cookies. Exercise  Follow your exercise program as told by your health care provider. A regular program:  Helps to decrease LDL and raise HDL.  Helps with weight control.  Do things that increase your activity level, such as gardening, walking, and taking the stairs.  Ask your health care provider about ways that you can be more active in your daily life. Medicine  Take over-the-counter and prescription medicines only as told by your health care provider.  Medicine may be prescribed by your health care provider to help lower cholesterol and decrease the risk for heart disease. This is usually done if diet and exercise have failed to bring down cholesterol levels.  If you have several risk factors, you may need medicine even if your levels are normal. This information is not intended to replace advice given to you by your health care provider. Make sure you discuss any questions you have with your health care provider. Document Released: 02/05/2001 Document Revised: 12/09/2015 Document Reviewed: 11/11/2015  Elsevier Interactive Patient Education  2017 [employer]  

## 2016-09-12 NOTE — Assessment & Plan Note (Signed)
Encouraged to get adequate exercise, calcium and vitamin d intake 

## 2016-09-12 NOTE — Progress Notes (Signed)
Pre visit review using our clinic review tool, if applicable. No additional management support is needed unless otherwise documented below in the visit note. 

## 2016-09-16 ENCOUNTER — Telehealth: Payer: Self-pay | Admitting: Family Medicine

## 2016-09-16 NOTE — Telephone Encounter (Signed)
Spoke with patient.  PC

## 2016-09-16 NOTE — Telephone Encounter (Signed)
Pt returned call for lab results.    CB: [phone]

## 2016-09-23 ENCOUNTER — Telehealth: Payer: Self-pay | Admitting: Family Medicine

## 2016-09-23 MED ORDER — ESTRADIOL 0.025 MG/24HR TD PTTW: MEDICATED_PATCH | TRANSDERMAL | 11 refills | Status: DC

## 2016-09-23 NOTE — Telephone Encounter (Signed)
°  Relation to pt: self  Call back number:[phone] Pharmacy: [facility], [address]  Reason for call:  Patient received letter from insurance stating estradiol (VIVELLE-DOT) 0.025 MG/24HR requires PA requesting 3 month supply, please advise

## 2016-09-24 NOTE — Telephone Encounter (Signed)
PA started in CoverMyMeds

## 2016-09-30 ENCOUNTER — Ambulatory Visit (INDEPENDENT_AMBULATORY_CARE_PROVIDER_SITE_OTHER): Payer: Medicare Other | Admitting: Internal Medicine

## 2016-09-30 ENCOUNTER — Encounter: Payer: Self-pay | Admitting: Internal Medicine

## 2016-09-30 VITALS — BP 116/78 | HR 83 | Ht 65.0 in | Wt 122.0 lb

## 2016-09-30 DIAGNOSIS — K219 Gastro-esophageal reflux disease without esophagitis: Secondary | ICD-10-CM

## 2016-09-30 DIAGNOSIS — K861 Other chronic pancreatitis: Secondary | ICD-10-CM

## 2016-09-30 DIAGNOSIS — K580 Irritable bowel syndrome with diarrhea: Secondary | ICD-10-CM | POA: Diagnosis not present

## 2016-09-30 MED ORDER — DIPHENOXYLATE-ATROPINE 2.5-0.025 MG PO TABS: 1.0000 | ORAL_TABLET | Freq: Four times a day (QID) | ORAL | 2 refills | Status: DC | PRN

## 2016-09-30 MED ORDER — PANCRELIPASE (LIP-PROT-AMYL) 24000-76000 UNITS PO CPEP: ORAL_CAPSULE | ORAL | 2 refills | Status: DC

## 2016-09-30 MED ORDER — RANITIDINE HCL 150 MG PO TABS: 150.0000 mg | ORAL_TABLET | Freq: Every evening | ORAL | 2 refills | Status: DC

## 2016-09-30 NOTE — Progress Notes (Signed)
Subjective:    Patient ID: [name], female    DOB: 1940-10-02, 76 y.o.   MRN: [id number]  HPI [name] is a 76 year old female with a history of chronic, autoimmune, pancreatitis, IBS, GERD, internal hemorrhoids resolved after banding is here for follow-up. She was last seen in November 2017. She is here alone today.  She reports she has been doing well though on 09/18/2016 she developed an episode of what she considers to be mild pancreatitis. This was after eating ribs at her son's birthday party. She had epigastric abdominal pain as well as significant nausea. No vomiting. Symptoms lasted about 3 days. She reduced her diet dramatically eating very little solid food and focusing on hydration and liquids. Her symptoms have totally resolved.  She reports that Lomotil has helped her tremendously she uses this 3 times a day as needed. She does not need it every day. She continues Creon 6 capsules with meals and 1-2 with snacks. She is using pantoprazole 40 mg a day which works well to control her acid reflux and heartburn. She rarely needs Zantac at bedtime. Her stools have been loose at times and at other times more formed. She reports this is "the usual for me". She denies bleeding and melena.  She and her husband have a trip later in May [place]. It will be a bus to her which leads from [facility] and makes a circle through the [place] and back to [facility]. She is excited for this trip.  After her last visit with me in November we performed an MRI of the abdomen which showed morphologically normal pancreas with no solid or cystic lesions. There is no evidence of pancreatitis at that time. We have reviewed this test today which is reassuring.  Review of Systems As per history of present illness, otherwise negative  Current Medications, Allergies, Past Medical History, Past Surgical History, Family History and Social History were reviewed in [employer] record.     Objective:   Physical Exam BP 116/78   Pulse 83   Ht '5\' 5"'  (1.651 m)   Wt 122 lb (55.3 kg)   BMI 20.30 kg/m  Constitutional: Well-developed and well-nourished. No distress. HEENT: Normocephalic and atraumatic. Oropharynx is clear and moist. Conjunctivae are normal.  No scleral icterus. Neck: Neck supple. Trachea midline. Cardiovascular: Normal rate, regular rhythm and intact distal pulses. No M/R/G Pulmonary/chest: Effort normal and breath sounds normal. No wheezing, rales or rhonchi. Abdominal: Soft, thin, nontender, nondistended. Bowel sounds active throughout. There are no masses palpable. No hepatosplenomegaly. Extremities: no clubbing, cyanosis, or edema Neurological: Alert and oriented to person place and time. Skin: Skin is warm and dry. Psychiatric: Normal mood and affect. Behavior is normal.  CBC    Component Value Date/Time   WBC 7.7 09/12/2016 1237   RBC 4.42 09/12/2016 1237   HGB 13.6 09/12/2016 1237   HCT 40.4 09/12/2016 1237   PLT 253.0 09/12/2016 1237   MCV 91.4 09/12/2016 1237   MCH 31.1 03/26/2013 0931   MCHC 33.5 09/12/2016 1237   RDW 13.9 09/12/2016 1237   LYMPHSABS 2.0 03/08/2014 1553   MONOABS 0.7 03/08/2014 1553   EOSABS 0.1 03/08/2014 1553   BASOSABS 0.0 03/08/2014 1553   CMP     Component Value Date/Time   NA 140 09/12/2016 1237   K 4.1 09/12/2016 1237   CL 105 09/12/2016 1237   CO2 27 09/12/2016 1237   GLUCOSE 82 09/12/2016  1237   BUN 18 09/12/2016 1237   CREATININE 0.87 09/12/2016 1237   CREATININE 0.92 03/26/2013 0931   CALCIUM 9.1 09/12/2016 1237   PROT 6.5 09/12/2016 1237   ALBUMIN 4.1 09/12/2016 1237   AST 19 09/12/2016 1237   ALT 15 09/12/2016 1237   ALKPHOS 56 09/12/2016 1237   BILITOT 0.4 09/12/2016 1237   GFRNONAA 81.18 03/12/2010 0848   GFRAA  12/05/2008 0645    >60        The eGFR has been calculated using the MDRD equation. This calculation has not been validated in all  clinical situations. eGFR's persistently <60 mL/min signify possible Chronic Kidney Disease.    MRI ABDOMEN WITHOUT AND WITH CONTRAST   TECHNIQUE: Multiplanar multisequence MR imaging of the abdomen was performed both before and after the administration of intravenous contrast.   CONTRAST:  11 mL of MultiHance.   COMPARISON:  CT the abdomen and pelvis 04/18/2014.   FINDINGS: Lower chest: Unremarkable.   Hepatobiliary: Mild diffuse loss of signal intensity throughout the hepatic parenchyma on out of phase dual echo images, compatible with hepatic steatosis. In the inferior aspect of segment 5 of the liver there is a well-defined 1.6 x 0.9 cm T1 hypointense, T2 hyperintense, nonenhancing lesion, compatible with a simple cyst. There is a smaller lesion with identical imaging characteristics in the central aspect of segment 8 of the liver measuring 6 mm is also noted, also compatible with a tiny cysts. No aggressive hepatic lesions are noted. Status post cholecystectomy. MRCP images demonstrate no intra or extrahepatic biliary ductal dilatation. Common bile duct measures 6 mm in the porta hepatis.   Pancreas: No pancreatic mass. No pancreatic ductal dilatation. No pancreatic or peripancreatic fluid or inflammatory changes.   Spleen:  Unremarkable.   Adrenals/Urinary Tract: There are 4 lesions in the left kidney which are T1 hyperintense, T2 hypointense, and do not enhance, compatible with proteinaceous/hemorrhagic cysts, largest of which measures 1 cm in the posterior aspect of the interpolar region of the left kidney. In addition, in the posterior aspect of the lower pole the left kidney there is a T1 hypointense, T2 hyperintense, nonenhancing lesion, compatible with a simple cyst. Right kidney and bilateral adrenal glands are normal in appearance. No hydroureteronephrosis in the visualized abdomen.   Stomach/Bowel: Visualized portions are unremarkable.    Vascular/Lymphatic: No aneurysm identified in the visualized abdominal vasculature. No lymphadenopathy noted in the abdomen.   Other: No significant volume of ascites noted in the visualized portions of the peritoneal cavity.   Musculoskeletal: No aggressive osseous lesions are noted in the visualized portions of the skeleton.   IMPRESSION: 1. No acute findings in the abdomen are noted to account for the patient's symptoms. 2. Specifically, the pancreas is morphologically normal in appearance, without findings to suggest an acute pancreatitis. 3. Mild hepatic steatosis. 4. Multiple small hemorrhagic/proteinaceous cysts in the left kidney, in addition to a small simple cyst in the left kidney. 5. Small simple cysts in the liver, as above.     Electronically Signed   By: Vinnie Langton M.D.   On: 04/03/2016 13:29      Assessment & Plan:  76 year old female with a history of chronic, autoimmune, pancreatitis, IBS, GERD, internal hemorrhoids resolved after banding is here for follow-up.   1. Chronic autoimmune pancreatitis -- well-controlled though she may have had a flare about 2 weeks ago after eating a fatty meal. Creon is definitively helpful for her. We will continue 6 capsules with meals and  1-2 with snacks. No evidence from our nutrition. MRI was reassuring. She has not needed steroids in quite some time for this issue. 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 IBS with alternating diarrhea and constipation though diarrhea predominant -- IBS is often present in patients with autoimmune pancreatitis. The low fodmap diet helps her significantly. We are continuing Creon as well as Lomotil 4 times a day when necessary  3. GERD/dyspepsia -- well-controlled without alarm symptoms on once daily PPI. We'll continue pantoprazole 40 mg each morning. She can use ranitidine in the evening as necessary for breakthrough heartburn/dyspepsia  4. CRC screening -- colonoscopy for surveillance to be  considered around September 2019  Nine-month follow-up, sooner if necessary 25 minutes spent with the patient today. Greater than 50% was spent in counseling and coordination of care with the patient

## 2016-09-30 NOTE — Patient Instructions (Signed)
We have sent the following prescriptions to your mail in pharmacy: Zantac Lomotil Creon  If you have not heard from your mail in pharmacy within 1 week or if you have not received your medication in the mail, please contact us at [phone] so we may find out why.  Please follow up with Dr Hilarie Fredrickson in 9 months.  If you are age 77 or older, your body mass index should be between 23-30. Your Body mass index is 20.3 kg/m. If this is out of the aforementioned range listed, please consider follow up with your Primary Care Provider.  If you are age 56 or younger, your body mass index should be between 19-25. Your Body mass index is 20.3 kg/m. If this is out of the aformentioned range listed, please consider follow up with your Primary Care Provider.

## 2016-10-01 ENCOUNTER — Telehealth: Payer: Self-pay | Admitting: *Deleted

## 2016-10-01 NOTE — Telephone Encounter (Signed)
[employer] has approved patient's Lomotil under Medicare part D plan until 05/26/17. [id number]

## 2016-10-08 ENCOUNTER — Encounter: Payer: Self-pay | Admitting: *Deleted

## 2016-10-09 ENCOUNTER — Telehealth: Payer: Self-pay | Admitting: Family Medicine

## 2016-10-09 NOTE — Telephone Encounter (Signed)
Caller name:[employer] Relationship to patient: Can be reached:[phone] Pharmacy:  Reason for call:requesting call back from some, wants to make sure MD is aware that Estradiol is high risk for the patient Case # [id number] [place]

## 2016-10-10 NOTE — Telephone Encounter (Signed)
PCP is aware  

## 2016-10-15 ENCOUNTER — Encounter: Payer: Self-pay | Admitting: *Deleted

## 2016-10-15 DIAGNOSIS — E2839 Other primary ovarian failure: Secondary | ICD-10-CM | POA: Insufficient documentation

## 2016-10-16 NOTE — Telephone Encounter (Signed)
Estradiol patches covered until 05/26/17. Case Number: [id number] [place].  Auth # [id number]  When this comes around again for the prior auth we can put that patient has an estrogen deficiency.  If the prior auth does not go thru then reprint appeal letter and change date.  It in the letters.

## 2016-11-18 ENCOUNTER — Encounter: Payer: Medicare Other | Admitting: Family Medicine

## 2017-02-09 NOTE — Progress Notes (Signed)
Cardiology Office Note Date:  02/13/2017  Patient ID:  Monique, [name] [birth year], MRN [id number] PCP:  Mosie Lukes, MD  Cardiologist:  Dr. Caryl Comes    Chief Complaint: syncope  History of Present Illness: [name] is a 77 y.o. female with history of ATach/SVT, HTN and orthostatic intolerances/symptoms, GERD, IBS, OA, type II autoimmune pancreatitis  She comes to the office today to be seen for Dr. Caryl Comes, last seen by him in March, at that time doing well, traveling, no changes were made to therapies.  She reports 2 syncopal episodes since her visit with Dr. Caryl Comes.  The 1st in May they had been traveling and that day at the [facility], her and her husband had done a lot of walking, in total 5 miles, was avery hot day and about an hour earlier grabbed a bite to eat at a kiosk.  She also mentions that the day prior her IBS had flared up and had number of diarrhea episodes.  She was standing taklkng with someone when she got sick to her stomach, felt weak and went to sit on a bench, leaned her head back but fainted sliding off the bench, waking quickly and without injury.  She felt very nauseous with retching.  She stayed very nauseous and eventually was evaluated by EMS hours later with ongoing nausea though not weak or dizzy, reporting to her her EKG was normal, BP by then was elevated, the impression of the EMS was not dehydrated, gave her IVF with nausea medicine.  The follow day she remained generally with an upset stomach but well enough and the follow day back to normal.  01/31/17 she was at a [place], was very hot, she had been standing inline for about 2 hours, reports several people had been getting ill, weak, needing to sit and lay down, around her, she developed the same nausea, became hot and went outside again to sit on a bench, did so, again leaned back but seated and fainted briefly.  Again waking very nauseous, but this time did not take 2 days to feel  better,  At no time with either event did she have any kind of cardiac awareness, no CP, palpitations at all.  Her predominant sympioms are getting hot, nauseous and weak, prior to and afterwards remains very nauseous particularly, weak.  Orthostatic today are negative and without symptoms, EKG is normal  Past Medical History:  Diagnosis Date  . Allergic state   . ANXIETY, CHRONIC   . 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 CHEST PAIN    LHC 02/2011: normal cors, EF 65%  . Diverticulosis   . Esophageal reflux   . Fibrocystic breast   . Hepatic cyst   . HYPOTENSION, ORTHOSTATIC   . IBS (irritable bowel syndrome)   . Internal hemorrhoids without mention of complication   . Kidney lesion   . Liver cyst   . Osteoarthritis   . Osteopenia   . Osteopenia   . Pain in ear   . PALPITATIONS, RECURRENT   . Pancreatitis chronic   . Preventative health care   . PSVT   . 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 SHOULDER PAIN   . TOBACCO USE, QUIT   . VITAMIN D DEFICIENCY     Past Surgical History:  Procedure Laterality Date  . ABDOMINAL HYSTERECTOMY  1998  . BREAST SURGERY     breast bx on left, benign  . CARDIAC CATHETERIZATION    .  CHOLECYSTECTOMY  1999  . [place]  . TUBAL LIGATION     with D&C    Current Outpatient Prescriptions  Medication Sig Dispense Refill  . Ascorbic Acid (VITAMIN C) 500 MG tablet Take 500 mg by mouth daily.      Marland Kitchen aspirin 81 MG tablet Take 81 mg by mouth 2 (two) times daily.      . cetirizine (ZYRTEC) 10 MG tablet Take 10 mg by mouth daily.      . Cholecalciferol (VITAMIN D3) 1000 UNITS CAPS Take 2 tablets by mouth daily.     Marland Kitchen CINNAMON PO Take by mouth daily.    . Cranberry 500 MG CAPS Take 1 capsule by mouth daily.    . diphenoxylate-atropine (LOMOTIL) 2.5-0.025 MG tablet Take 1 tablet by mouth 4 (four) times daily as needed for diarrhea or loose stools. 360 tablet 2  . escitalopram (LEXAPRO) 10 MG tablet Take 1 tablet (10 mg  total) by mouth daily. 90 tablet 3  . estradiol (VIVELLE-DOT) 0.025 MG/24HR Apply 1 patch topically to skin twice weekly. 24 patch 11  . fluticasone (FLONASE) 50 MCG/ACT nasal spray Place 2 sprays into both nostrils as needed for rhinitis or allergies. 16 g 0  . metroNIDAZOLE (METROCREAM) 0.75 % cream     . NON FORMULARY HORMONE PATCH    . Pancrelipase, Lip-Prot-Amyl, (CREON) 24000-76000 units CPEP TAKE 6 CAPSULES BY MOUTH  WITH EACH MEAL AND 1 TO 2  CAPSULES WITH EACH SNACK 2100 capsule 2  . pantoprazole (PROTONIX) 40 MG tablet Take 1 tablet (40 mg total) by mouth daily. 90 tablet 3  . Probiotic Product (PROBIOTIC PO) Take by mouth. Takes Probaclac probiotic once daily     . TURMERIC PO Take by mouth daily.     No current facility-administered medications for this visit.     Allergies:   Amoxicillin; Azithromycin; Ciprofloxacin; Epinephrine; Penicillins; Sulfonamide derivatives; and Tamiflu [oseltamivir phosphate]   Social History:  The patient  reports that she quit smoking about 43 years ago. Her smoking use included Cigarettes. She has never used smokeless tobacco. She reports that she does not drink alcohol or use drugs.   Family History:  The patient's family history includes Bladder Cancer in her father; Breast cancer in her cousin, paternal aunt, and paternal aunt; Heart disease in her father, maternal grandfather, maternal grandmother, and sister; Hypertension in her mother; Lung cancer in her paternal uncle; Ovarian cancer in her paternal aunt; Pancreatic cancer in her paternal aunt; Stomach cancer in her paternal uncle; Stroke in her maternal grandfather, maternal grandmother, and mother; Uterine cancer in her paternal grandmother.  ROS:  Please see the history of present illness.   All other systems are reviewed and otherwise negative.   PHYSICAL EXAM:  VS:  BP (!) 140/94   Pulse 82   Ht 5' 5.25" (1.657 m)   Wt 126 lb (57.2 kg)   BMI 20.81 kg/m  BMI: Body mass index is 20.81  kg/m. Well nourished, well developed, in no acute distress  HEENT: normocephalic, atraumatic  Neck: no JVD, carotid bruits or masses Cardiac:   RRR; no significant murmurs, no rubs, or gallops Lungs:  CTA b/l, no wheezing, rhonchi or rales  Abd: soft, nontender MS: no deformity, age appropriate atrophy Ext: no edema  Skin: warm and dry, no rash Neuro:  No gross deficits appreciated Psych: euthymic mood, full affect   EKG:   Done today and reviewed by myself is SR, 70bpm, PR 112mas, QRS  17ms, QTc 460ms  03/04/02: TTE SUMMARY - Left ventricular ejection fraction was estimated to be 65 %.    There was no diagnostic evidence of left ventricular regional    wall motion abnormalities. - There are no significant abnormalities noted IMPRESSIONS - There are no significant abnormalities noted.  03/26/11: LHC CONCLUSIONS: 1. Smooth and normal coronary arteries. 2. Normal left ventricular systolic function.  Recent Labs: 09/12/2016: ALT 15; BUN 18; Creatinine, Ser 0.87; Hemoglobin 13.6; Platelets 253.0; Potassium 4.1; Sodium 140; TSH 2.03  09/12/2016: Cholesterol 146; HDL 102.30; LDL Cholesterol 38; Total CHOL/HDL Ratio 1; Triglycerides 26.0; VLDL 5.2   CrCl cannot be calculated (Patient's most recent lab result is older than the maximum 21 days allowed.).   Wt Readings from Last 3 Encounters:  02/13/17 126 lb (57.2 kg)  09/30/16 122 lb (55.3 kg)  09/12/16 123 lb 3.2 oz (55.9 kg)     Other studies reviewed: Additional studies/records reviewed today include: summarized above  ASSESSMENT AND PLAN:  1. ATach     No symptoms to suggest recurrence  2. HTN, othostatic hypotension     BP a little elevated her today, though she reports a component of "white coat" syndrome  We discussed at length her events, symptom recognition and management.  Rather then walk to a place to sit she needs to lay down where she is until the event passes to avoid full syncope, injury and  hopefully the post syncope squale as well.  She has significant issues with diarrhea/IBS and hydration is important, we discussed avoidance of triggers, prolonged heat exposure, long periods of standing, and so on, and strategies like support stockings to help in management as well.   Disposition: F/u in 6 months, sooner if needed.  She will keep an eye on her BP notifying us if routinely > 150/90  Current medicines are reviewed at length with the patient today.  The patient did not have any concerns regarding medicines.  Venetia Night, PA-C 02/13/2017 11:27 AM     [address] [place] [address] [phone] (office)  [phone] (fax)

## 2017-02-13 ENCOUNTER — Encounter (INDEPENDENT_AMBULATORY_CARE_PROVIDER_SITE_OTHER): Payer: Self-pay

## 2017-02-13 ENCOUNTER — Ambulatory Visit (INDEPENDENT_AMBULATORY_CARE_PROVIDER_SITE_OTHER): Payer: Medicare Other | Admitting: Physician Assistant

## 2017-02-13 VITALS — BP 140/94 | HR 82 | Ht 65.25 in | Wt 126.0 lb

## 2017-02-13 DIAGNOSIS — I10 Essential (primary) hypertension: Secondary | ICD-10-CM

## 2017-02-13 DIAGNOSIS — R55 Syncope and collapse: Secondary | ICD-10-CM

## 2017-02-13 LAB — CBC
HEMOGLOBIN: 13.1 g/dL (ref 11.1–15.9)
Hematocrit: 38.1 % (ref 34.0–46.6)
MCH: 30.6 pg (ref 26.6–33.0)
MCHC: 34.4 g/dL (ref 31.5–35.7)
MCV: 89 fL (ref 79–97)
Platelets: 255 10*3/uL (ref 150–379)
RBC: 4.28 x10E6/uL (ref 3.77–5.28)
RDW: 13.4 % (ref 12.3–15.4)
WBC: 5.9 10*3/uL (ref 3.4–10.8)

## 2017-02-13 LAB — BASIC METABOLIC PANEL
BUN/Creatinine Ratio: 14 (ref 12–28)
BUN: 14 mg/dL (ref 8–27)
CO2: 26 mmol/L (ref 20–29)
Calcium: 8.9 mg/dL (ref 8.7–10.3)
Chloride: 105 mmol/L (ref 96–106)
Creatinine, Ser: 0.98 mg/dL (ref 0.57–1.00)
GFR calc Af Amer: 64 mL/min/{1.73_m2} (ref >59)
GFR calc non Af Amer: 56 mL/min/{1.73_m2} — ABNORMAL LOW (ref >59)
GLUCOSE: 92 mg/dL (ref 65–99)
Potassium: 4.4 mmol/L (ref 3.5–5.2)
SODIUM: 143 mmol/L (ref 134–144)

## 2017-02-13 NOTE — Patient Instructions (Addendum)
Medication Instructions:   Your physician recommends that you continue on your current medications as directed. Please refer to the Current Medication list given to you today.   If you need a refill on your cardiac medications before your next appointment, please call your pharmacy.  Labwork:  BMET AND CBC TODAY    Testing/Procedures: NONE ORDERED  TODAY    Follow-Up: Your physician wants you to follow-up in:  IN [place] will receive a reminder letter in the mail two months in advance. If you don't receive a letter, please call our office to schedule the follow-up appointment.

## 2017-02-20 ENCOUNTER — Telehealth: Payer: Self-pay | Admitting: *Deleted

## 2017-02-20 NOTE — Telephone Encounter (Signed)
LMOVM  OF NORMAL RESULTS AND CALL CLINIC IF ANY QUESTIONS OR CONCERNS.

## 2017-02-20 NOTE — Telephone Encounter (Signed)
-----   Message from [facility] [place] sent at 02/14/2017  2:07 PM EDT ----- Please let the patient know her labs looked OK.  Thanks renee

## 2017-04-03 ENCOUNTER — Telehealth: Payer: Self-pay | Admitting: *Deleted

## 2017-04-03 NOTE — Telephone Encounter (Signed)
Received Physician Orders from [facility]; forwarded to provider/SLS 11/08

## 2017-04-10 ENCOUNTER — Other Ambulatory Visit: Payer: Self-pay | Admitting: Internal Medicine

## 2017-05-23 ENCOUNTER — Encounter: Payer: Self-pay | Admitting: Family Medicine

## 2017-05-23 LAB — HM MAMMOGRAPHY

## 2017-06-05 ENCOUNTER — Encounter: Payer: Self-pay | Admitting: Family Medicine

## 2017-06-05 IMAGING — MR MR ABDOMEN WO/W CM
11 of 21 series · 19 of 48 positions shown · IV contrast (multihance)
Comparison: CT the abdomen and pelvis 04/18/2014.

CLINICAL DATA: 75-year-old female with history of pancreatitis and
history of irritable bowel syndrome for 20 years presenting with
sharp abdominal pain and cramping, with occasional diarrhea.

EXAM:
MRI ABDOMEN WITHOUT AND WITH CONTRAST
TECHNIQUE: Multiplanar multisequence MR imaging of the abdomen was performed
both before and after the administration of intravenous contrast.
CONTRAST:  11 mL of MultiHance.

[Series 3: T2 fat-sat · axial · 5.0mm · 0.70mm/px · 1 of 52 slices shown]
[im 1/52]
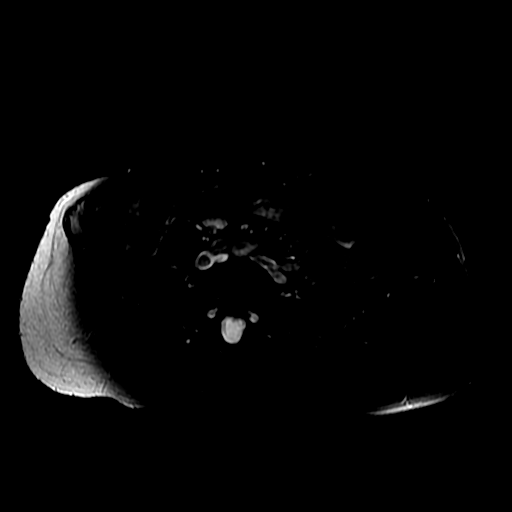

[Series 4: DWI b500 · axial · 6.0mm · 1.41mm/px · 1 of 68 slices shown]
[im 1/68]
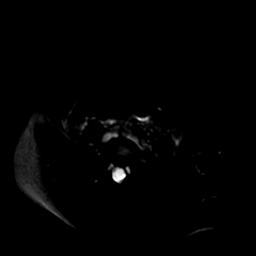

[Series 6: ax dualecho · axial · 5.0mm · 0.70mm/px · z∈[-80,+175]mm · 2 of 104 slices shown]
[im 1/104]
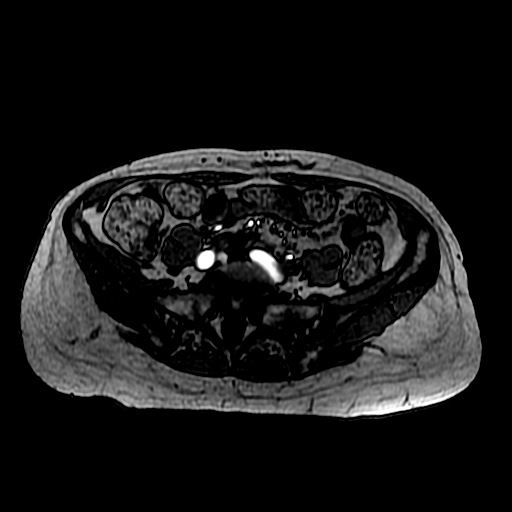
[im 104/104]
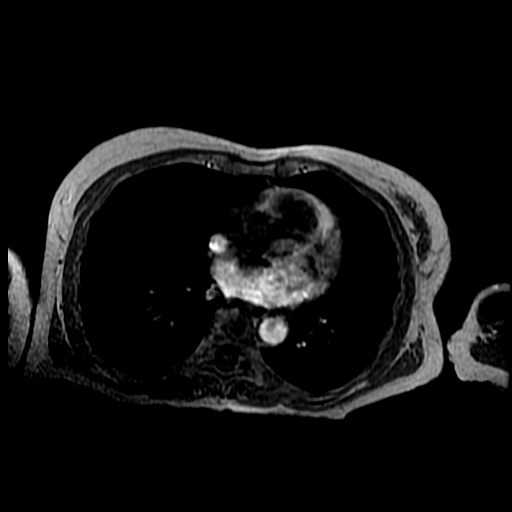

[Series 7: T2 · axial · 5.0mm · 0.70mm/px · 1 of 52 slices shown (1 of 2)]
[im 1/52]
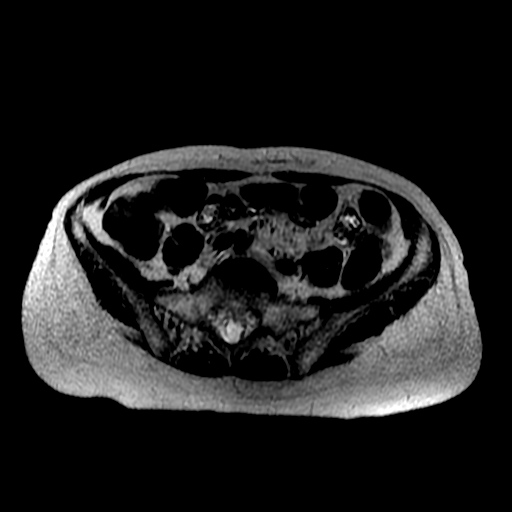

[Series 8: T2 · coronal · 5.0mm · 0.68mm/px · 1 of 34 slices shown (2 of 2)]
[im 1/34]
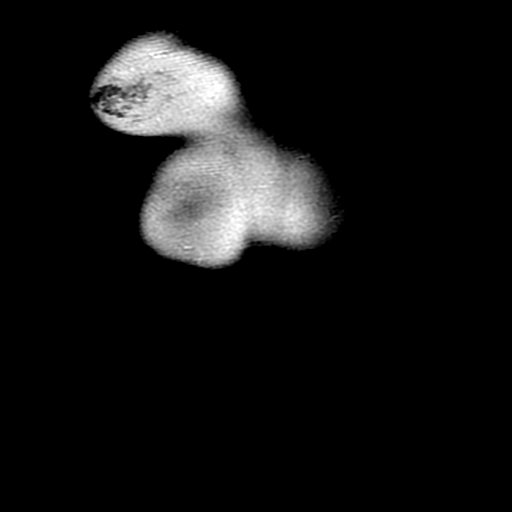

[Series 9: bSSFP · coronal · 5.0mm · 0.68mm/px · 1 of 34 slices shown]
[im 1/34]
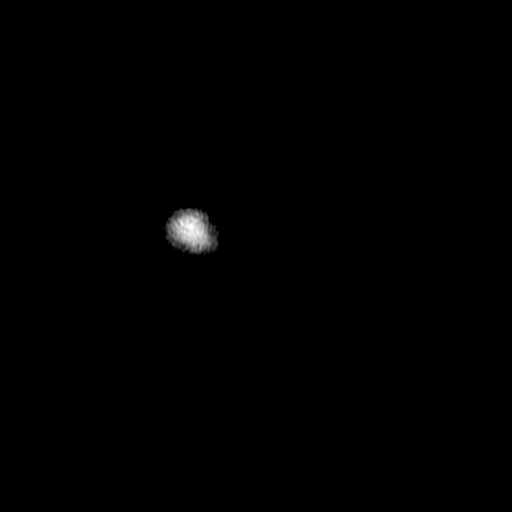

[Series 400: DWI · axial · 6.0mm · 1.41mm/px · 1 of 34 slices shown]
[im 1/34]
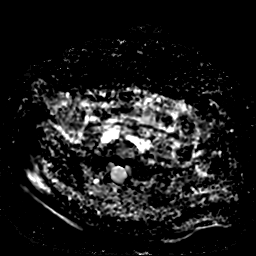

[Series 500: reformatted · axial · 1.6mm · 0.62mm/px · z∈[+43,+172]mm · 3 of 112 slices shown (1 of 2)]
[im 1/112]
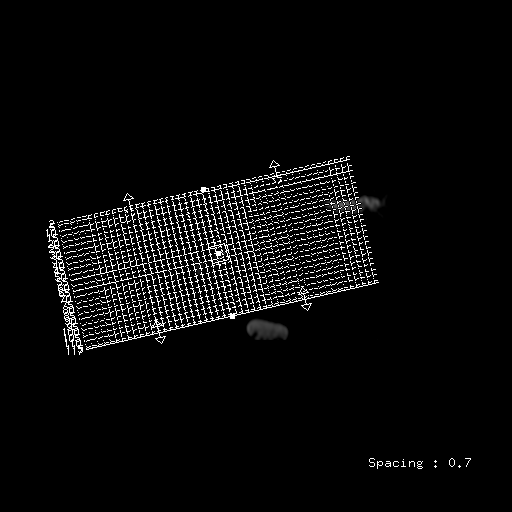
[im 56/112]
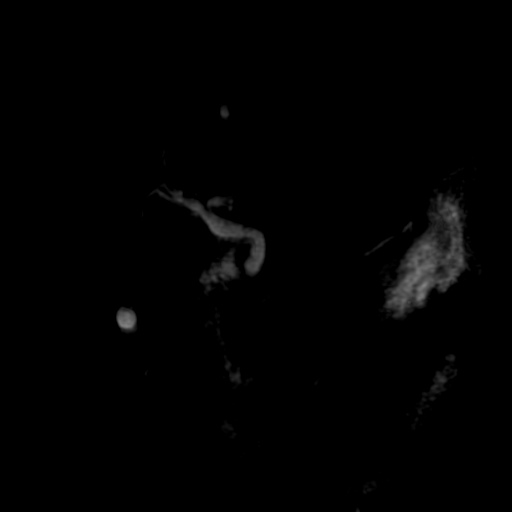
[im 112/112]
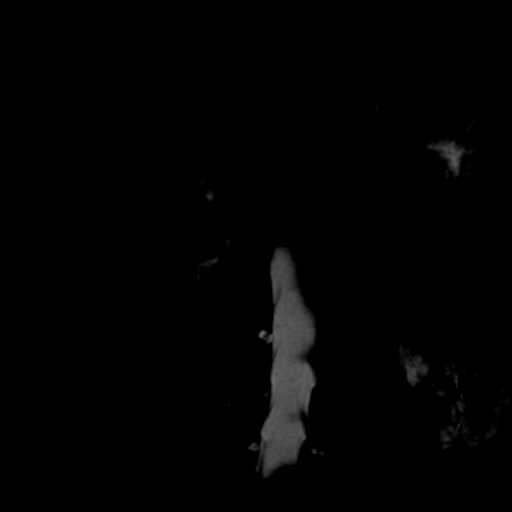

[Series 501: reformatted · axial · 1.6mm · 0.62mm/px · z∈[+43,+172]mm · 3 of 97 slices shown (2 of 2)]
[im 1/97]
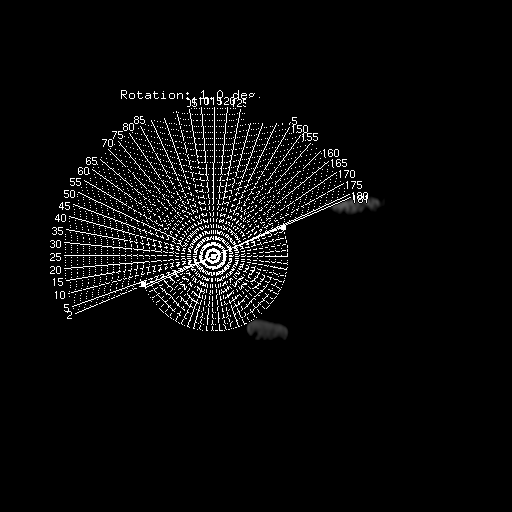
[im 49/97]
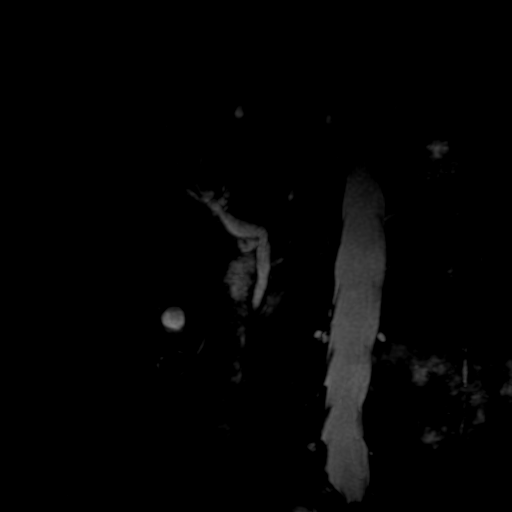
[im 97/97]
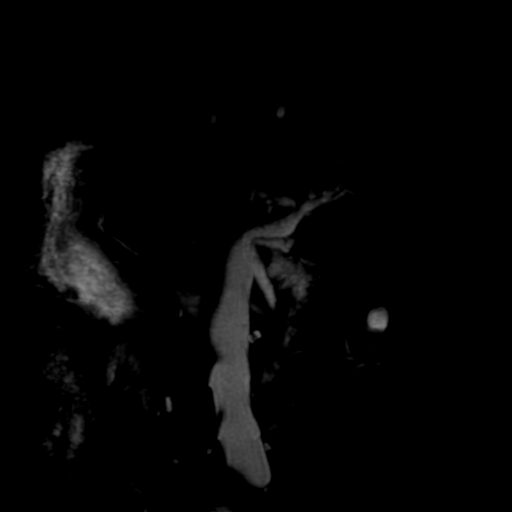

[Series 1000: T1 dynamic · axial · 5.6mm · 0.78mm/px · z∈[-70,+173]mm · 3 of 88 slices shown (1 of 2)]
[im 1/88]
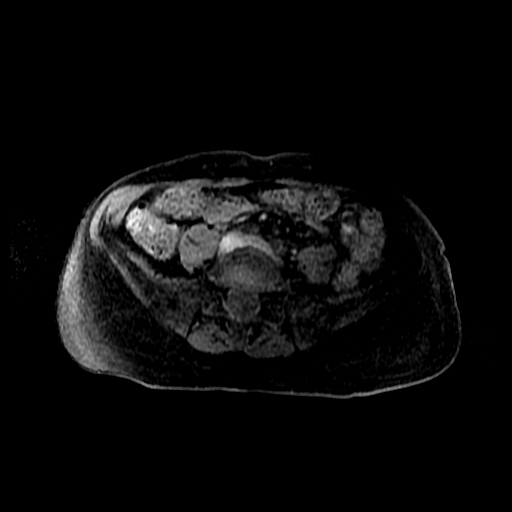
[im 44/88]
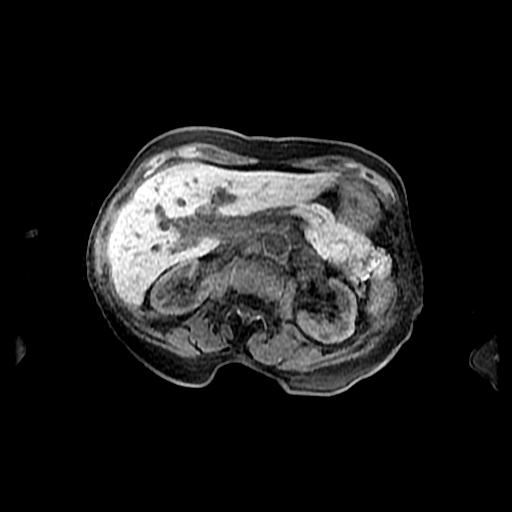
[im 88/88]
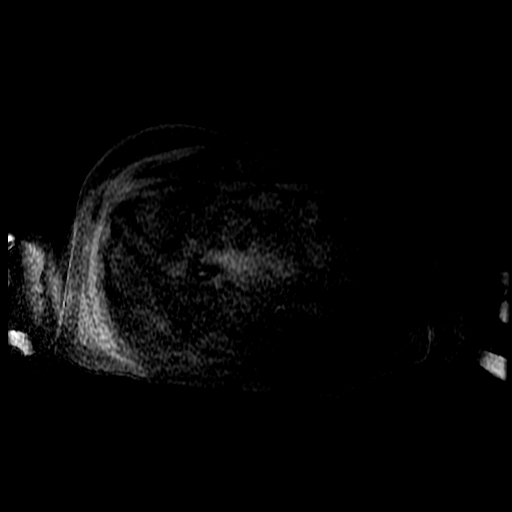

[Series 1001: T1 dynamic · axial · 5.6mm · 0.78mm/px · z∈[-70,+50]mm · 2 of 88 slices shown (2 of 2)]
[im 1/88]
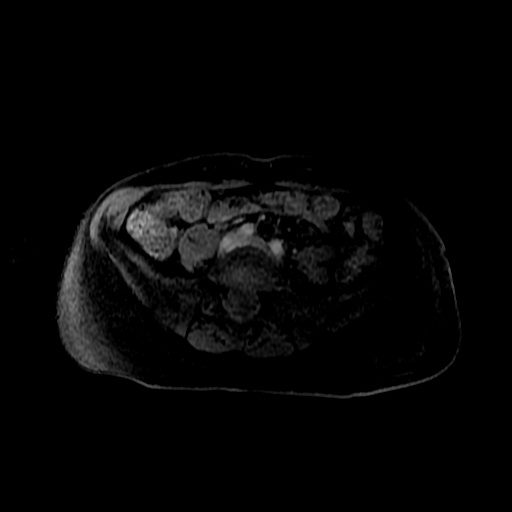
[im 44/88]
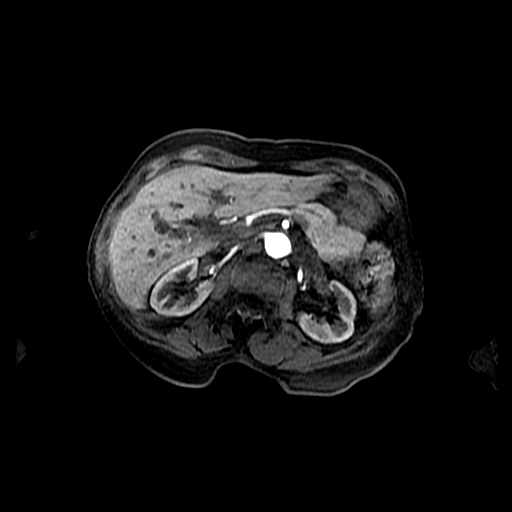

[19 of 48 positions shown; findings below may reference images not displayed]

FINDINGS: Lower chest: Unremarkable.

Hepatobiliary: Mild diffuse loss of signal intensity throughout the
hepatic parenchyma on out of phase dual echo images, compatible with
hepatic steatosis. In the inferior aspect of segment 5 of the liver
there is a well-defined 1.6 x 0.9 cm T1 hypointense, T2
hyperintense, nonenhancing lesion, compatible with a simple cyst.
There is a smaller lesion with identical imaging characteristics in
the central aspect of segment 8 of the liver measuring 6 mm is also
noted, also compatible with a tiny cysts. No aggressive hepatic
lesions are noted. Status post cholecystectomy. MRCP images
demonstrate no intra or extrahepatic biliary ductal dilatation.
Common bile duct measures 6 mm in the porta hepatis.

Pancreas: No pancreatic mass. No pancreatic ductal dilatation. No
pancreatic or peripancreatic fluid or inflammatory changes.

Spleen:  Unremarkable.

Adrenals/Urinary Tract: There are 4 lesions in the left kidney which
are T1 hyperintense, T2 hypointense, and do not enhance, compatible
with proteinaceous/hemorrhagic cysts, largest of which measures 1 cm
in the posterior aspect of the interpolar region of the left kidney.
In addition, in the posterior aspect of the lower pole the left
kidney there is a T1 hypointense, T2 hyperintense, nonenhancing
lesion, compatible with a simple cyst. Right kidney and bilateral
adrenal glands are normal in appearance. No hydroureteronephrosis in
the visualized abdomen.

Stomach/Bowel: Visualized portions are unremarkable.

Vascular/Lymphatic: No aneurysm identified in the visualized
abdominal vasculature. No lymphadenopathy noted in the abdomen.

Other: No significant volume of ascites noted in the visualized
portions of the peritoneal cavity.

Musculoskeletal: No aggressive osseous lesions are noted in the
visualized portions of the skeleton.
IMPRESSION: 1. No acute findings in the abdomen are noted to account for the
patient's symptoms.
2. Specifically, the pancreas is morphologically normal in
appearance, without findings to suggest an acute pancreatitis.
3. Mild hepatic steatosis.
4. Multiple small hemorrhagic/proteinaceous cysts in the left
kidney, in addition to a small simple cyst in the left kidney.
5. Small simple cysts in the liver, as above.

## 2017-06-06 ENCOUNTER — Telehealth: Payer: Self-pay | Admitting: *Deleted

## 2017-06-06 NOTE — Telephone Encounter (Signed)
Received Bone Density results from [facility]; forwarded to provider/SLS 01/11

## 2017-06-17 ENCOUNTER — Other Ambulatory Visit: Payer: Self-pay | Admitting: Family Medicine

## 2017-07-28 ENCOUNTER — Ambulatory Visit (INDEPENDENT_AMBULATORY_CARE_PROVIDER_SITE_OTHER): Payer: Medicare Other | Admitting: Internal Medicine

## 2017-07-28 ENCOUNTER — Other Ambulatory Visit (INDEPENDENT_AMBULATORY_CARE_PROVIDER_SITE_OTHER): Payer: Medicare Other

## 2017-07-28 ENCOUNTER — Encounter: Payer: Self-pay | Admitting: Internal Medicine

## 2017-07-28 VITALS — BP 142/84 | HR 70 | Ht 65.25 in | Wt 127.0 lb

## 2017-07-28 DIAGNOSIS — K589 Irritable bowel syndrome without diarrhea: Secondary | ICD-10-CM | POA: Diagnosis not present

## 2017-07-28 DIAGNOSIS — K861 Other chronic pancreatitis: Secondary | ICD-10-CM | POA: Diagnosis not present

## 2017-07-28 DIAGNOSIS — K219 Gastro-esophageal reflux disease without esophagitis: Secondary | ICD-10-CM

## 2017-07-28 LAB — CBC WITH DIFFERENTIAL/PLATELET
BASOS ABS: 0 10*3/uL (ref 0.0–0.1)
Basophils Relative: 0.4 % (ref 0.0–3.0)
EOS PCT: 1.4 % (ref 0.0–5.0)
Eosinophils Absolute: 0.1 10*3/uL (ref 0.0–0.7)
HEMATOCRIT: 42.1 % (ref 36.0–46.0)
Hemoglobin: 14 g/dL (ref 12.0–15.0)
Lymphocytes Relative: 24.3 % (ref 12.0–46.0)
Lymphs Abs: 2 10*3/uL (ref 0.7–4.0)
MCHC: 33.4 g/dL (ref 30.0–36.0)
MCV: 93.3 fl (ref 78.0–100.0)
MONOS PCT: 7.7 % (ref 3.0–12.0)
Monocytes Absolute: 0.6 10*3/uL (ref 0.1–1.0)
NEUTROS ABS: 5.6 10*3/uL (ref 1.4–7.7)
Neutrophils Relative %: 66.2 % (ref 43.0–77.0)
Platelets: 268 10*3/uL (ref 150.0–400.0)
RBC: 4.51 Mil/uL (ref 3.87–5.11)
RDW: 14.1 % (ref 11.5–15.5)
WBC: 8.4 10*3/uL (ref 4.0–10.5)

## 2017-07-28 LAB — COMPREHENSIVE METABOLIC PANEL
ALK PHOS: 56 U/L (ref 39–117)
ALT: 18 U/L (ref 0–35)
AST: 17 U/L (ref 0–37)
Albumin: 3.9 g/dL (ref 3.5–5.2)
BILIRUBIN TOTAL: 0.4 mg/dL (ref 0.2–1.2)
BUN: 13 mg/dL (ref 6–23)
CO2: 31 meq/L (ref 19–32)
Calcium: 9.2 mg/dL (ref 8.4–10.5)
Chloride: 102 mEq/L (ref 96–112)
Creatinine, Ser: 0.91 mg/dL (ref 0.40–1.20)
GFR: 63.64 mL/min (ref >60.00)
Glucose, Bld: 94 mg/dL (ref 70–99)
POTASSIUM: 4 meq/L (ref 3.5–5.1)
Sodium: 140 mEq/L (ref 135–145)
Total Protein: 6.6 g/dL (ref 6.0–8.3)

## 2017-07-28 MED ORDER — PANCRELIPASE (LIP-PROT-AMYL) 24000-76000 UNITS PO CPEP: ORAL_CAPSULE | ORAL | 2 refills | Status: DC

## 2017-07-28 MED ORDER — DIPHENOXYLATE-ATROPINE 2.5-0.025 MG PO TABS: 1.0000 | ORAL_TABLET | Freq: Four times a day (QID) | ORAL | 2 refills | Status: DC | PRN

## 2017-07-28 NOTE — Patient Instructions (Signed)
Your physician has requested that you go to the basement for the following lab work before leaving today: CBC, CMP  Please follow up with Dr Hilarie Fredrickson in 1 year.  We have sent the following medications to your pharmacy for you to pick up at your convenience: Creon Protonix Lomotil  You will be due for a recall cologuard in 01/2018. We will send you a reminder in the mail when it gets closer to that time.  If you are age 78 or older, your body mass index should be between 23-30. Your Body mass index is 20.97 kg/m. If this is out of the aforementioned range listed, please consider follow up with your Primary Care Provider.  If you are age 27 or younger, your body mass index should be between 19-25. Your Body mass index is 20.97 kg/m. If this is out of the aformentioned range listed, please consider follow up with your Primary Care Provider.

## 2017-07-28 NOTE — Progress Notes (Signed)
   Subjective:    Patient ID: [name], female    DOB: [birth year], 77 y.o.   MRN: [id number]  HPI [name] is a 77 year old female with a history of chronic autoimmune pancreatitis, IBS, GERD, internal hemorrhoids resolved after banding who is here for follow-up.  Last seen May 2018.  She is here alone today.  She reports that she has been doing well and in her usual state of health.  She has continued Creon 6 capsules with meals.  She is following a [place] diet which she has found helpful.  Occasionally she will has loose stools and she will use Lomotil which has been very beneficial for her.  Reflux is well controlled with pantoprazole 40 mg daily.  She has not needed Zantac at bedtime recently.  She denies nausea, vomiting, abdominal pain today.  No blood in her stool or melena.  Review of Systems As per HPI, otherwise negative  Current Medications, Allergies, Past Medical History, Past Surgical History, Family History and Social History were reviewed in [employer] record.     Objective:   Physical Exam BP (!) 142/84   Pulse 70   Ht 5' 5.25" (1.657 m)   Wt 127 lb (57.6 kg)   BMI 20.97 kg/m  Constitutional: Well-developed and well-nourished. No distress. HEENT: Normocephalic and atraumatic.  Conjunctivae are normal.  No scleral icterus. Neck: Neck supple. Trachea midline. Cardiovascular: Normal rate, regular rhythm and intact distal pulses. Pulmonary/chest: Effort normal and breath sounds normal. No wheezing, rales or rhonchi. Abdominal: Soft, nontender, nondistended. Bowel sounds active throughout.  Extremities: no clubbing, cyanosis, or edema Neurological: Alert and oriented to person place and time. Skin: Skin is warm and dry. Psychiatric: Normal mood and affect. Behavior is normal.      Assessment & Plan:   77 year old female with a history of chronic autoimmune pancreatitis, IBS, GERD, internal hemorrhoids resolved after banding who is here  for follow-up.   1.  Chronic autoimmune pancreatitis --well-controlled without significant flare and she has not required steroid therapy in some time.  Continue Creon 6 capsules with meal, 1-2 with snacks.  No evidence for malnutrition.  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  IBS with alternating diarrhea and constipation --as needed Lomotil working well.  Creon also helping with diarrhea  3.  GERD/dyspepsia --doing well without alarm symptoms.  We discussed the risk, benefits and alternatives to daily PPI and she wishes to continue this medicine.  Continue pantoprazole 40 mg daily  4.  CRC screening --colonoscopy versus Cologuard recommended in September 2019.  After we discussed options she prefers Cologuard but understands that if this is positive she would need colonoscopy thereafter.  12 month follow-up, sooner as needed 15 minutes spent with the patient today. Greater than 50% was spent in counseling and coordination of care with the patient

## 2017-08-01 ENCOUNTER — Telehealth: Payer: Self-pay | Admitting: *Deleted

## 2017-08-01 NOTE — Telephone Encounter (Signed)
We have gotten a faxed approval from [employer] for Lomotil 2.5 mg thru 05/26/18 under patient's medicare part D benefit. Reference # [id number].

## 2017-08-11 ENCOUNTER — Telehealth: Payer: Self-pay | Admitting: Family Medicine

## 2017-08-11 NOTE — Telephone Encounter (Signed)
Will route to office for final disposition. 

## 2017-08-11 NOTE — Telephone Encounter (Signed)
Copied from [place] [phone]. Topic: Quick Communication - Rx Refill/Question >> Aug 11, 2017 10:46 AM Neva Seat wrote: estradiol (VIVELLE-DOT) 0.025 MG/24HR Pt needing 3 months supply  Needing prior approval for refills.   [place], [facility] [address] [place] [address] Phone: [phone] Fax: [phone]

## 2017-08-12 MED ORDER — ESTRADIOL 0.025 MG/24HR TD PTTW: MEDICATED_PATCH | TRANSDERMAL | 11 refills | Status: DC

## 2017-08-12 NOTE — Telephone Encounter (Signed)
Medication sent in. 

## 2017-08-14 ENCOUNTER — Telehealth: Payer: Self-pay

## 2017-08-14 NOTE — Telephone Encounter (Signed)
Request Reference Number: [id number]. VIVELLE-DOT DIS 0.025MG  is approved through 05/26/2018. For further questions, call [phone].

## 2017-08-14 NOTE — Telephone Encounter (Signed)
PA initiated via Covermymeds; KEY: [id number]. Awaiting determination.

## 2017-09-15 ENCOUNTER — Ambulatory Visit: Payer: Medicare Other | Admitting: *Deleted

## 2017-09-15 ENCOUNTER — Encounter: Payer: Medicare Other | Admitting: Family Medicine

## 2017-09-17 ENCOUNTER — Encounter: Payer: Self-pay | Admitting: Internal Medicine

## 2017-09-17 ENCOUNTER — Ambulatory Visit (INDEPENDENT_AMBULATORY_CARE_PROVIDER_SITE_OTHER): Payer: Medicare Other | Admitting: Internal Medicine

## 2017-09-17 VITALS — BP 150/86 | HR 75 | Ht 65.25 in | Wt 128.0 lb

## 2017-09-17 DIAGNOSIS — I951 Orthostatic hypotension: Secondary | ICD-10-CM | POA: Diagnosis not present

## 2017-09-17 DIAGNOSIS — I471 Supraventricular tachycardia: Secondary | ICD-10-CM

## 2017-09-17 DIAGNOSIS — R55 Syncope and collapse: Secondary | ICD-10-CM

## 2017-09-17 NOTE — Patient Instructions (Signed)
Medication Instructions:  Your physician has recommended you make the following change in your medication:   1. Stop your aspirin  Labwork: None ordered.  Testing/Procedures: None ordered.  Follow-Up: Your physician wants you to follow-up in: One Year with Dr Caryl Comes. You will receive a reminder letter in the mail two months in advance. If you don't receive a letter, please call our office to schedule the follow-up appointment.  Any Other Special Instructions Will Be Listed Below (If Applicable).     If you need a refill on your cardiac medications before your next appointment, please call your pharmacy.

## 2017-09-17 NOTE — Progress Notes (Signed)
Patient Care Team: Mosie Lukes, MD as PCP - General (Family Medicine) Deboraha Sprang, MD as Consulting Physician (Cardiology) Pyrtle, Lajuan Lines, MD as Consulting Physician (Gastroenterology) Rolm Bookbinder, MD as Consulting Physician (Dermatology) Alphonsa Overall, MD as Consulting Physician (General Surgery) Melissa Montane, MD as Consulting Physician (Otolaryngology) Luberta Mutter, MD as Consulting Physician (Ophthalmology)   HPI  [name] is a 77 y.o. female Seen in followup. She has a history of recurrent chest pain. Myoview scanning 2010 was normal. Because of recurrent chest pain, 2012 she underwent catheterization that was normal  She and her husband travel extensively  The patient denies chest pain, shortness of breath, nocturnal dyspnea, orthopnea or peripheral edema.  There have been no palpitations, lightheadedness or syncope.      Past Medical History:  Diagnosis Date  . Allergic state   . ANXIETY, CHRONIC   . 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 CHEST PAIN    LHC 02/2011: normal cors, EF 65%  . Diverticulosis   . Esophageal reflux   . Fibrocystic breast   . Hepatic cyst   . HYPOTENSION, ORTHOSTATIC   . IBS (irritable bowel syndrome)   . Internal hemorrhoids without mention of complication   . Kidney lesion   . Liver cyst   . Osteoarthritis   . Osteopenia   . Osteopenia   . Pain in ear   . PALPITATIONS, RECURRENT   . Pancreatitis chronic   . Preventative health care   . PSVT   . 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 SHOULDER PAIN   . TOBACCO USE, QUIT   . VITAMIN D DEFICIENCY     Past Surgical History:  Procedure Laterality Date  . ABDOMINAL HYSTERECTOMY  1998  . BREAST SURGERY     breast bx on left, benign  . CARDIAC CATHETERIZATION    . CATARACT EXTRACTION, BILATERAL    . CHOLECYSTECTOMY  1999  . [place]  . TUBAL LIGATION     with D&C    Current Outpatient Medications  Medication Sig Dispense Refill  . Ascorbic  Acid (VITAMIN C) 500 MG tablet Take 500 mg by mouth daily.      Marland Kitchen aspirin 81 MG tablet Take 81 mg by mouth 2 (two) times daily.      . cetirizine (ZYRTEC) 10 MG tablet Take 10 mg by mouth daily.      . Cholecalciferol (VITAMIN D3) 1000 UNITS CAPS Take 2 tablets by mouth daily.     Marland Kitchen CINNAMON PO Take by mouth daily.    . Cranberry 500 MG CAPS Take 1 capsule by mouth daily.    . diphenoxylate-atropine (LOMOTIL) 2.5-0.025 MG tablet Take 1 tablet by mouth 4 (four) times daily as needed for diarrhea or loose stools. 360 tablet 2  . escitalopram (LEXAPRO) 10 MG tablet Take 1 tablet (10 mg total) by mouth daily. 90 tablet 3  . estradiol (VIVELLE-DOT) 0.025 MG/24HR Apply 1 patch topically to skin twice weekly. 24 patch 11  . fluticasone (FLONASE) 50 MCG/ACT nasal spray Place 2 sprays into both nostrils as needed for rhinitis or allergies. 16 g 0  . metroNIDAZOLE (METROCREAM) 0.75 % cream     . NON FORMULARY HORMONE PATCH    . Pancrelipase, Lip-Prot-Amyl, (CREON) 24000-76000 units CPEP TAKE 6 CAPSULES BY MOUTH  WITH EACH MEAL AND 1 TO 2  CAPSULES WITH EACH SNACK 2100 capsule 2  . pantoprazole (PROTONIX) 40 MG tablet TAKE 1 TABLET BY MOUTH  DAILY 90  tablet 3  . Probiotic Product (PROBIOTIC PO) Take by mouth. Takes Probaclac probiotic once daily     . TURMERIC PO Take by mouth daily.     No current facility-administered medications for this visit.     Allergies  Allergen Reactions  . Amoxicillin     REACTION: unspecified  . Azithromycin     REACTION: Rash  . Ciprofloxacin     REACTION: GI upset  . Epinephrine     REACTION: unspecified  . Penicillins     REACTION: rash  . Sulfonamide Derivatives     REACTION: sick to stomach  . Tamiflu [Oseltamivir Phosphate] Hives    Review of Systems negative except from HPI and PMH  Physical Exam BP (!) 150/86   Pulse 75   Ht 5' 5.25" (1.657 m)   Wt 128 lb (58.1 kg)   SpO2 99%   BMI 21.14 kg/m  Well developed and nourished in no acute  distress HENT normal Neck supple with JVP-flat Clear Regular rate and rhythm, no murmurs or gallops Abd-soft with active BS No Clubbing cyanosis edema Skin-warm and dry A & Oriented  Grossly normal sensory and motor function   ECG  Sinus at 75 Intervals 13/08/41     Assessment and  Plan  Hypertension - white coat.  Atrial Tach-- no significant recurrences  Orthostatic intolerance  No symptomatic tachycardia or orthostasis.  We will stop her aspirin.

## 2017-09-29 ENCOUNTER — Other Ambulatory Visit: Payer: Self-pay | Admitting: Family Medicine

## 2017-09-29 DIAGNOSIS — F419 Anxiety disorder, unspecified: Secondary | ICD-10-CM

## 2017-10-10 ENCOUNTER — Encounter: Payer: Self-pay | Admitting: Family Medicine

## 2017-10-10 ENCOUNTER — Ambulatory Visit (INDEPENDENT_AMBULATORY_CARE_PROVIDER_SITE_OTHER): Payer: Medicare Other | Admitting: Family Medicine

## 2017-10-10 VITALS — BP 136/89 | HR 77 | Temp 97.9°F | Resp 20 | Ht 65.0 in | Wt 128.0 lb

## 2017-10-10 DIAGNOSIS — L237 Allergic contact dermatitis due to plants, except food: Secondary | ICD-10-CM | POA: Diagnosis not present

## 2017-10-10 MED ORDER — PREDNISONE 20 MG PO TABS: ORAL_TABLET | ORAL | 0 refills | Status: DC

## 2017-10-10 MED ORDER — HYDROXYZINE HCL 10 MG PO TABS: 10.0000 mg | ORAL_TABLET | Freq: Every day | ORAL | 0 refills | Status: DC

## 2017-10-10 MED ORDER — METHYLPREDNISOLONE ACETATE 80 MG/ML IJ SUSP
80.0000 mg | Freq: Once | INTRAMUSCULAR | Status: AC
  Administered 2017-10-10: 80 mg via INTRAMUSCULAR

## 2017-10-10 NOTE — Progress Notes (Signed)
[name] , [birth year], 77 y.o., female MRN: [id number] Patient Care Team    Relationship Specialty Notifications Start End  Mosie Lukes, MD PCP - General Family Medicine  03/20/11    Comment: Teena Dunk, Revonda Standard, MD Consulting Physician Cardiology  07/02/11   Pyrtle, Lajuan Lines, MD Consulting Physician Gastroenterology  07/29/14   Rolm Bookbinder, MD Consulting Physician Dermatology  07/29/14   Alphonsa Overall, MD Consulting Physician General Surgery  07/29/14   Melissa Montane, MD Consulting Physician Otolaryngology  08/03/15   Luberta Mutter, MD Consulting Physician Ophthalmology  09/12/16     Chief Complaint  Patient presents with  . Rash    poison ivy     Subjective: Pt presents for an OV with complaints of red rash with blisters since working outdoors 4 days ago. She has been using zyrtec and calamine lotion, but rash continues to spread and cause severe itching. She tried benadryl 25 mg TID, but felt that made her really dry/difficult to swallow.   Depression screen [facility] 2/9 09/12/2016 08/03/2015 08/01/2014 04/04/2013  Decreased Interest 0 0 0 0  Down, Depressed, Hopeless 0 0 0 0  PHQ - 2 Score 0 0 0 0    Allergies  Allergen Reactions  . Amoxicillin     REACTION: unspecified  . Azithromycin     REACTION: Rash  . Ciprofloxacin     REACTION: GI upset  . Epinephrine     REACTION: unspecified  . Penicillins     REACTION: rash  . Sulfonamide Derivatives     REACTION: sick to stomach  . Tamiflu [Oseltamivir Phosphate] Hives   Social History   Tobacco Use  . Smoking status: Former Smoker    Types: Cigarettes    Last attempt to quit: 05/27/1973    Years since quitting: 44.4  . Smokeless tobacco: Never Used  Substance Use Topics  . Alcohol use: No   Past Medical History:  Diagnosis Date  . Allergic state   . ANXIETY, CHRONIC   . 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 CHEST PAIN    LHC 02/2011: normal cors, EF 65%  . Diverticulosis   . Esophageal reflux   .  Fibrocystic breast   . Hepatic cyst   . HYPOTENSION, ORTHOSTATIC   . IBS (irritable bowel syndrome)   . Internal hemorrhoids without mention of complication   . Kidney lesion   . Liver cyst   . Osteoarthritis   . Osteopenia   . Osteopenia   . Pain in ear   . PALPITATIONS, RECURRENT   . Pancreatitis chronic   . Preventative health care   . PSVT   . 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 SHOULDER PAIN   . TOBACCO USE, QUIT   . VITAMIN D DEFICIENCY    Past Surgical History:  Procedure Laterality Date  . ABDOMINAL HYSTERECTOMY  1998  . BREAST SURGERY     breast bx on left, benign  . CARDIAC CATHETERIZATION    . CATARACT EXTRACTION, BILATERAL    . CHOLECYSTECTOMY  1999  . [place]  . TUBAL LIGATION     with D&C   Family History  Problem Relation Age of Onset  . Hypertension Mother   . Stroke Mother   . Heart disease Father   . Bladder Cancer Father   . Uterine cancer Paternal Grandmother   . Heart disease Sister   . Heart disease Maternal Grandmother   . Stroke Maternal Grandmother   .  Heart disease Maternal Grandfather   . Stroke Maternal Grandfather   . Breast cancer Cousin   . Ovarian cancer Paternal Aunt   . Breast cancer Paternal Aunt   . Pancreatic cancer Paternal Aunt   . Stomach cancer Paternal Uncle   . Breast cancer Paternal Aunt   . Lung cancer Paternal Uncle    Allergies as of 10/10/2017      Reactions   Amoxicillin    REACTION: unspecified   Azithromycin    REACTION: Rash   Ciprofloxacin    REACTION: GI upset   Epinephrine    REACTION: unspecified   Penicillins    REACTION: rash   Sulfonamide Derivatives    REACTION: sick to stomach   Tamiflu [oseltamivir Phosphate] Hives      Medication List        Accurate as of 10/10/17  1:07 PM. Always use your most recent med list.          cetirizine 10 MG tablet Commonly known as:  ZYRTEC Take 10 mg by mouth daily.   CINNAMON PO Take by mouth daily.   Cranberry 500 MG  Caps Take 1 capsule by mouth daily.   diphenoxylate-atropine 2.5-0.025 MG tablet Commonly known as:  LOMOTIL Take 1 tablet by mouth 4 (four) times daily as needed for diarrhea or loose stools.   escitalopram 10 MG tablet Commonly known as:  LEXAPRO TAKE 1 TABLET BY MOUTH  DAILY   estradiol 0.025 MG/24HR Commonly known as:  VIVELLE-DOT Apply 1 patch topically to skin twice weekly.   fluticasone 50 MCG/ACT nasal spray Commonly known as:  FLONASE Place 2 sprays into both nostrils as needed for rhinitis or allergies.   metroNIDAZOLE 0.75 % cream Commonly known as:  METROCREAM   NON FORMULARY HORMONE PATCH   Pancrelipase (Lip-Prot-Amyl) 24000-76000 units Cpep Commonly known as:  CREON TAKE 6 CAPSULES BY MOUTH  WITH EACH MEAL AND 1 TO 2  CAPSULES WITH EACH SNACK   pantoprazole 40 MG tablet Commonly known as:  PROTONIX TAKE 1 TABLET BY MOUTH  DAILY   PROBIOTIC PO Take by mouth. Takes Probaclac probiotic once daily   TURMERIC PO Take by mouth daily.   vitamin C 500 MG tablet Commonly known as:  ASCORBIC ACID Take 500 mg by mouth daily.   Vitamin D3 1000 units Caps Take 2 tablets by mouth daily.       All past medical history, surgical history, allergies, family history, immunizations andmedications were updated in the EMR today and reviewed under the history and medication portions of their EMR.     ROS: Negative, with the exception of above mentioned in HPI   Objective:  BP 136/89 (BP Location: Left Arm, Patient Position: Sitting, Cuff Size: Normal)   Pulse 77   Temp 97.9 F (36.6 C)   Resp 20   Ht 5\' 5"  (1.651 m)   Wt 128 lb (58.1 kg)   SpO2 97%   BMI 21.30 kg/m  Body mass index is 21.3 kg/m. Gen: Afebrile. No acute distress. Nontoxic in appearance, well developed, well nourished.  HENT: AT. [place].MMM, no oral lesions.  Eyes:Pupils Equal Round Reactive to light, Extraocular movements intact,  Conjunctiva without redness, discharge or icterus. Skin: red  based vesicular rash and swelling bilateral LE and arms (moderate coverage).  No purpura or petechiae.  Neuro: Normal gait. PERLA. EOMi. Alert. Oriented x3 Psych: Normal affect, dress and demeanor. Normal speech. Normal thought content and judgment.  No exam data present No results found.  No results found for this or any previous visit (from the past 24 hour(s)).  Assessment/Plan: [name] is a 77 y.o. female present for OV for  Poison ivy dermatitis - discussed topical  Treatment and preventive suggestions.  - IM depo medrol today. Prednisone 10 day taper start tomorrow.  - Vistaril 10 mg QHS with precautions - F/U 1-2 weeks if not resolved or worsening after tapering down on prednisone she will cal in and we will provide an extended course for her    Reviewed expectations re: course of current medical issues.  Discussed self-management of symptoms.  Outlined signs and symptoms indicating need for more acute intervention.  Patient verbalized understanding and all questions were answered.  Patient received an After-Visit Summary.    No orders of the defined types were placed in this encounter.    Note is dictated utilizing voice recognition software. Although note has been proof read prior to signing, occasional typographical errors still can be missed. If any questions arise, please do not hesitate to call for verification.   electronically signed by:  Howard Pouch, DO  [place]

## 2017-10-10 NOTE — Patient Instructions (Addendum)
Purex Fels-Naptha Laundry Bar & Stain Remover (try it :)  ) Steroid injection today  vistaril 10 mg before bed Start steroids tomorrow.   If worsening when taper down on steroids, then call in and we will call in the extended course.     Poison Ivy Dermatitis Poison ivy dermatitis is redness and soreness (inflammation) of the skin. It is caused by a chemical that is found on the leaves of the poison ivy plant. You may also have itching, a rash, and blisters. Symptoms often clear up in 1-2 weeks. You may get this condition by touching a poison ivy plant. You can also get it by touching something that has the chemical on it. This may include animals or objects that have come in contact with the plant. Follow these instructions at home: General instructions  Take or apply over-the-counter and prescription medicines only as told by your doctor.  If you touch poison ivy, wash your skin with soap and cold water right away.  Use hydrocortisone creams or calamine lotion as needed to help with itching.  Take oatmeal baths as needed. Use colloidal oatmeal. You can get this at a pharmacy or grocery store. Follow the instructions on the package.  Do not scratch or rub your skin.  While you have the rash, wash your clothes right after you wear them. Prevention  Know what poison ivy looks like so you can avoid it. This plant has three leaves with flowering branches on a single stem. The leaves are glossy. They have uneven edges that come to a point at the front.  If you have touched poison ivy, wash with soap and water right away. Be sure to wash under your fingernails.  When hiking or camping, wear long pants, a long-sleeved shirt, tall socks, and hiking boots. You can also use a lotion on your skin that helps to prevent contact with the chemical on the plant.  If you think that your clothes or outdoor gear came in contact with poison ivy, rinse them off with a garden hose before you bring them  inside your house. Contact a doctor if:  You have open sores in the rash area.  You have more redness, swelling, or pain in the affected area.  You have redness that spreads beyond the rash area.  You have fluid, blood, or pus coming from the affected area.  You have a fever.  You have a rash over a large area of your body.  You have a rash on your eyes, mouth, or genitals.  Your rash does not get better after a few days. Get help right away if:  Your face swells or your eyes swell shut.  You have trouble breathing.  You have trouble swallowing. This information is not intended to replace advice given to you by your health care provider. Make sure you discuss any questions you have with your health care provider. Document Released: 06/15/2010 Document Revised: 10/19/2015 Document Reviewed: 10/19/2014 Elsevier Interactive Patient Education  [employer].

## 2017-10-13 ENCOUNTER — Ambulatory Visit (INDEPENDENT_AMBULATORY_CARE_PROVIDER_SITE_OTHER): Payer: Medicare Other | Admitting: Family Medicine

## 2017-10-13 ENCOUNTER — Encounter: Payer: Self-pay | Admitting: Family Medicine

## 2017-10-13 ENCOUNTER — Encounter: Payer: Medicare Other | Admitting: Family Medicine

## 2017-10-13 VITALS — BP 148/70 | HR 69 | Temp 98.2°F | Resp 18 | Wt 130.4 lb

## 2017-10-13 DIAGNOSIS — Z0000 Encounter for general adult medical examination without abnormal findings: Secondary | ICD-10-CM | POA: Diagnosis not present

## 2017-10-13 DIAGNOSIS — M8580 Other specified disorders of bone density and structure, unspecified site: Secondary | ICD-10-CM | POA: Diagnosis not present

## 2017-10-13 DIAGNOSIS — S6992XA Unspecified injury of left wrist, hand and finger(s), initial encounter: Secondary | ICD-10-CM

## 2017-10-13 DIAGNOSIS — K861 Other chronic pancreatitis: Secondary | ICD-10-CM | POA: Diagnosis not present

## 2017-10-13 DIAGNOSIS — K219 Gastro-esophageal reflux disease without esophagitis: Secondary | ICD-10-CM

## 2017-10-13 DIAGNOSIS — L259 Unspecified contact dermatitis, unspecified cause: Secondary | ICD-10-CM

## 2017-10-13 DIAGNOSIS — Z23 Encounter for immunization: Secondary | ICD-10-CM | POA: Diagnosis not present

## 2017-10-13 DIAGNOSIS — E559 Vitamin D deficiency, unspecified: Secondary | ICD-10-CM

## 2017-10-13 DIAGNOSIS — K589 Irritable bowel syndrome without diarrhea: Secondary | ICD-10-CM

## 2017-10-13 HISTORY — DX: Unspecified contact dermatitis, unspecified cause: L25.9

## 2017-10-13 HISTORY — DX: Unspecified injury of left wrist, hand and finger(s), initial encounter: S69.92XA

## 2017-10-13 LAB — COMPREHENSIVE METABOLIC PANEL
ALT: 18 U/L (ref 0–35)
AST: 16 U/L (ref 0–37)
Albumin: 4 g/dL (ref 3.5–5.2)
Alkaline Phosphatase: 52 U/L (ref 39–117)
BILIRUBIN TOTAL: 0.4 mg/dL (ref 0.2–1.2)
BUN: 19 mg/dL (ref 6–23)
CALCIUM: 9.1 mg/dL (ref 8.4–10.5)
CHLORIDE: 105 meq/L (ref 96–112)
CO2: 30 meq/L (ref 19–32)
CREATININE: 1.02 mg/dL (ref 0.40–1.20)
GFR: 55.75 mL/min — ABNORMAL LOW (ref >60.00)
Glucose, Bld: 89 mg/dL (ref 70–99)
Potassium: 3.6 mEq/L (ref 3.5–5.1)
SODIUM: 143 meq/L (ref 135–145)
Total Protein: 6.2 g/dL (ref 6.0–8.3)

## 2017-10-13 LAB — CBC WITH DIFFERENTIAL/PLATELET
BASOS ABS: 0 10*3/uL (ref 0.0–0.1)
BASOS PCT: 0.3 % (ref 0.0–3.0)
Eosinophils Absolute: 0.2 10*3/uL (ref 0.0–0.7)
Eosinophils Relative: 1.6 % (ref 0.0–5.0)
HEMATOCRIT: 38.7 % (ref 36.0–46.0)
HEMOGLOBIN: 13 g/dL (ref 12.0–15.0)
Lymphocytes Relative: 9.6 % — ABNORMAL LOW (ref 12.0–46.0)
Lymphs Abs: 1 10*3/uL (ref 0.7–4.0)
MCHC: 33.6 g/dL (ref 30.0–36.0)
MCV: 92.4 fl (ref 78.0–100.0)
Monocytes Absolute: 0.4 10*3/uL (ref 0.1–1.0)
Monocytes Relative: 3.9 % (ref 3.0–12.0)
NEUTROS ABS: 8.8 10*3/uL — AB (ref 1.4–7.7)
Neutrophils Relative %: 84.6 % — ABNORMAL HIGH (ref 43.0–77.0)
Platelets: 266 10*3/uL (ref 150.0–400.0)
RBC: 4.19 Mil/uL (ref 3.87–5.11)
RDW: 14.2 % (ref 11.5–15.5)
WBC: 10.4 10*3/uL (ref 4.0–10.5)

## 2017-10-13 LAB — LIPASE: Lipase: 58 U/L (ref 11.0–59.0)

## 2017-10-13 LAB — VITAMIN D 25 HYDROXY (VIT D DEFICIENCY, FRACTURES): VITD: 42.03 ng/mL (ref 30.00–100.00)

## 2017-10-13 LAB — TSH: TSH: 2.21 u[IU]/mL (ref 0.35–4.50)

## 2017-10-13 LAB — AMYLASE: Amylase: 63 U/L (ref 27–131)

## 2017-10-13 NOTE — Assessment & Plan Note (Signed)
Punctured left palm of hand. Is given Tdap today

## 2017-10-13 NOTE — Assessment & Plan Note (Signed)
Encouraged to get adequate exercise, calcium and vitamin d intake 

## 2017-10-13 NOTE — Patient Instructions (Signed)
Preventive Care 78 Years and Older, Female Preventive care refers to lifestyle choices and visits with your health care provider that can promote health and wellness. What does preventive care include?  A yearly physical exam. This is also called an annual well check.  Dental exams once or twice a year.  Routine eye exams. Ask your health care provider how often you should have your eyes checked.  Personal lifestyle choices, including: ? Daily care of your teeth and gums. ? Regular physical activity. ? Eating a healthy diet. ? Avoiding tobacco and drug use. ? Limiting alcohol use. ? Practicing safe sex. ? Taking low-dose aspirin every day. ? Taking vitamin and mineral supplements as recommended by your health care provider. What happens during an annual well check? The services and screenings done by your health care provider during your annual well check will depend on your age, overall health, lifestyle risk factors, and family history of disease. Counseling Your health care provider may ask you questions about your:  Alcohol use.  Tobacco use.  Drug use.  Emotional well-being.  Home and relationship well-being.  Sexual activity.  Eating habits.  History of falls.  Memory and ability to understand (cognition).  Work and work environment.  Reproductive health.  Screening You may have the following tests or measurements:  Height, weight, and BMI.  Blood pressure.  Lipid and cholesterol levels. These may be checked every 5 years, or more frequently if you are over 50 years old.  Skin check.  Lung cancer screening. You may have this screening every year starting at age 55 if you have a 30-pack-year history of smoking and currently smoke or have quit within the past 15 years.  Fecal occult blood test (FOBT) of the stool. You may have this test every year starting at age 50.  Flexible sigmoidoscopy or colonoscopy. You may have a sigmoidoscopy every 5 years or  a colonoscopy every 10 years starting at age 50.  Hepatitis C blood test.  Hepatitis B blood test.  Sexually transmitted disease (STD) testing.  Diabetes screening. This is done by checking your blood sugar (glucose) after you have not eaten for a while (fasting). You may have this done every 1-3 years.  Bone density scan. This is done to screen for osteoporosis. You may have this done starting at age 65.  Mammogram. This may be done every 1-2 years. Talk to your health care provider about how often you should have regular mammograms.  Talk with your health care provider about your test results, treatment options, and if necessary, the need for more tests. Vaccines Your health care provider may recommend certain vaccines, such as:  Influenza vaccine. This is recommended every year.  Tetanus, diphtheria, and acellular pertussis (Tdap, Td) vaccine. You may need a Td booster every 10 years.  Varicella vaccine. You may need this if you have not been vaccinated.  Zoster vaccine. You may need this after age 60.  Measles, mumps, and rubella (MMR) vaccine. You may need at least one dose of MMR if you were born in 1957 or later. You may also need a second dose.  Pneumococcal 13-valent conjugate (PCV13) vaccine. One dose is recommended after age 65.  Pneumococcal polysaccharide (PPSV23) vaccine. One dose is recommended after age 65.  Meningococcal vaccine. You may need this if you have certain conditions.  Hepatitis A vaccine. You may need this if you have certain conditions or if you travel or work in places where you may be exposed to hepatitis   A.  Hepatitis B vaccine. You may need this if you have certain conditions or if you travel or work in places where you may be exposed to hepatitis B.  Haemophilus influenzae type b (Hib) vaccine. You may need this if you have certain conditions.  Talk to your health care provider about which screenings and vaccines you need and how often you  need them. This information is not intended to replace advice given to you by your health care provider. Make sure you discuss any questions you have with your health care provider. Document Released: 06/09/2015 Document Revised: 01/31/2016 Document Reviewed: 03/14/2015 Elsevier Interactive Patient Education  2018 [employer] Inc.  

## 2017-10-13 NOTE — Assessment & Plan Note (Signed)
Patient encouraged to maintain heart healthy diet, regular exercise, adequate sleep. Consider daily probiotics. Take medications as prescribed 

## 2017-10-13 NOTE — Assessment & Plan Note (Signed)
Use a daily supplements

## 2017-10-13 NOTE — Progress Notes (Signed)
Subjective:  I acted as a Education administrator for Dr. Charlett Blake. [name], [place]  Patient ID: [name], female    DOB: [birth year], 77 y.o.   MRN: [id number]  No chief complaint on file.   HPI  Patient is in today for an annual exam and follow up on chronic medical concerns including reflux, chronic pancreatitis, anxiety and more. Notes she just suffered a puncture wound of her left hand. No warmth or discharge. She recently had cataracts removed from both eyes with good results. She had a bad itchy rash on her right leg after working in the yard. She was seen at OR and treated for contact dermatitis with good results. Denies CP/palp/SOB/HA/congestion/fevers/GI or GU c/o. Taking meds as prescribed  Patient Care Team: Mosie Lukes, MD as PCP - General (Family Medicine) Deboraha Sprang, MD as Consulting Physician (Cardiology) Pyrtle, Lajuan Lines, MD as Consulting Physician (Gastroenterology) Rolm Bookbinder, MD as Consulting Physician (Dermatology) Alphonsa Overall, MD as Consulting Physician (General Surgery) Melissa Montane, MD as Consulting Physician (Otolaryngology) Luberta Mutter, MD as Consulting Physician (Ophthalmology)   Past Medical History:  Diagnosis Date  . Allergic state   . ANXIETY, CHRONIC   . 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 CHEST PAIN    LHC 02/2011: normal cors, EF 65%  . Contact dermatitis 10/13/2017  . Diverticulosis   . Esophageal reflux   . Fibrocystic breast   . Hand injury, left, initial encounter 10/13/2017  . Hepatic cyst   . HYPOTENSION, ORTHOSTATIC   . IBS (irritable bowel syndrome)   . Internal hemorrhoids without mention of complication   . Kidney lesion   . Liver cyst   . Osteoarthritis   . Osteopenia   . Osteopenia   . Pain in ear   . PALPITATIONS, RECURRENT   . Pancreatitis chronic   . Preventative health care   . PSVT   . 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 SHOULDER PAIN   . TOBACCO USE, QUIT   . VITAMIN D DEFICIENCY     Past Surgical  History:  Procedure Laterality Date  . ABDOMINAL HYSTERECTOMY  1998  . BREAST SURGERY     breast bx on left, benign  . CARDIAC CATHETERIZATION    . CATARACT EXTRACTION, BILATERAL    . CATARACT EXTRACTION, BILATERAL    . CHOLECYSTECTOMY  1999  . EYE SURGERY Bilateral 04/2017, 05/2017   cataracts  . [place]  . TUBAL LIGATION     with D&C    Family History  Problem Relation Age of Onset  . Hypertension Mother   . Stroke Mother   . Alcohol abuse Mother   . Heart disease Father   . Bladder Cancer Father   . Diabetes Father   . Hypothyroidism Father   . Dementia Father   . Uterine cancer Paternal Grandmother   . Heart disease Sister        rheumatic fever at 6 months, cardiomegaly MI  . Heart disease Maternal Grandmother   . Stroke Maternal Grandmother   . Heart disease Maternal Grandfather   . Stroke Maternal Grandfather   . Breast cancer Cousin   . Pancreatic cancer Paternal Aunt   . Stomach cancer Paternal Uncle   . Breast cancer Paternal Aunt   . Lung cancer Paternal Uncle   . Crohn's disease Son     Social History   Socioeconomic History  . Marital status: Married    Spouse name: Not on file  . Number  of children: 1  . Years of education: Not on file  . Highest education level: Not on file  Occupational History  . Occupation: Retired    Fish farm manager: RETIRED  Social Needs  . Financial resource strain: Not on file  . Food insecurity:    Worry: Not on file    Inability: Not on file  . Transportation needs:    Medical: Not on file    Non-medical: Not on file  Tobacco Use  . Smoking status: Former Smoker    Types: Cigarettes    Last attempt to quit: 05/27/1973    Years since quitting: 44.4  . Smokeless tobacco: Never Used  Substance and Sexual Activity  . Alcohol use: No  . Drug use: No  . Sexual activity: Yes  Lifestyle  . Physical activity:    Days per week: Not on file    Minutes per session: Not on file  . Stress: Not on file    Relationships  . Social connections:    Talks on phone: Not on file    Gets together: Not on file    Attends religious service: Not on file    Active member of club or organization: Not on file    Attends meetings of clubs or organizations: Not on file    Relationship status: Not on file  . Intimate partner violence:    Fear of current or ex partner: Not on file    Emotionally abused: Not on file    Physically abused: Not on file    Forced sexual activity: Not on file  Other Topics Concern  . Not on file  Social History Narrative  . Not on file    Outpatient Medications Prior to Visit  Medication Sig Dispense Refill  . Ascorbic Acid (VITAMIN C) 500 MG tablet Take 500 mg by mouth daily.      Marland Kitchen b complex vitamins tablet Take 1 tablet by mouth daily.    . cetirizine (ZYRTEC) 10 MG tablet Take 10 mg by mouth daily.      . Cholecalciferol (VITAMIN D3) 1000 UNITS CAPS Take 2 tablets by mouth daily.     Marland Kitchen CINNAMON PO Take by mouth daily.    . Cranberry 500 MG CAPS Take 1 capsule by mouth daily.    . diphenoxylate-atropine (LOMOTIL) 2.5-0.025 MG tablet Take 1 tablet by mouth 4 (four) times daily as needed for diarrhea or loose stools. 360 tablet 2  . escitalopram (LEXAPRO) 10 MG tablet TAKE 1 TABLET BY MOUTH  DAILY 90 tablet 3  . estradiol (VIVELLE-DOT) 0.025 MG/24HR Apply 1 patch topically to skin twice weekly. 24 patch 11  . fluticasone (FLONASE) 50 MCG/ACT nasal spray Place 2 sprays into both nostrils as needed for rhinitis or allergies. 16 g 0  . hydrOXYzine (ATARAX/VISTARIL) 10 MG tablet Take 1 tablet (10 mg total) by mouth at bedtime. 30 tablet 0  . metroNIDAZOLE (METROCREAM) 0.75 % cream     . Pancrelipase, Lip-Prot-Amyl, (CREON) 24000-76000 units CPEP TAKE 6 CAPSULES BY MOUTH  WITH EACH MEAL AND 1 TO 2  CAPSULES WITH EACH SNACK 2100 capsule 2  . pantoprazole (PROTONIX) 40 MG tablet TAKE 1 TABLET BY MOUTH  DAILY 90 tablet 3  . predniSONE (DELTASONE) 20 MG tablet 60 mg x3d, 40 mg  x3d, 20 mg x2d, 10 mg x2d 18 tablet 0  . Probiotic Product (PROBIOTIC PO) Take by mouth. Takes Probaclac probiotic once daily     . TURMERIC PO Take by mouth daily.    Marland Kitchen  NON FORMULARY HORMONE PATCH     No facility-administered medications prior to visit.     Allergies  Allergen Reactions  . Amoxicillin     REACTION: unspecified  . Azithromycin     REACTION: Rash  . Ciprofloxacin     REACTION: GI upset  . Epinephrine     REACTION: unspecified  . Penicillins     REACTION: rash  . Sulfonamide Derivatives     REACTION: sick to stomach  . Tamiflu [Oseltamivir Phosphate] Hives    Review of Systems  Constitutional: Negative for chills, fever and malaise/fatigue.  HENT: Negative for congestion and hearing loss.   Eyes: Negative for discharge.  Respiratory: Negative for cough, sputum production and shortness of breath.   Cardiovascular: Negative for chest pain, palpitations and leg swelling.  Gastrointestinal: Negative for abdominal pain, blood in stool, constipation, diarrhea, heartburn, nausea and vomiting.  Genitourinary: Negative for dysuria, frequency, hematuria and urgency.  Musculoskeletal: Negative for back pain, falls and myalgias.  Skin: Positive for rash.  Neurological: Negative for dizziness, sensory change, loss of consciousness, weakness and headaches.  Endo/Heme/Allergies: Negative for environmental allergies. Does not bruise/bleed easily.  Psychiatric/Behavioral: Negative for depression and suicidal ideas. The patient is not nervous/anxious and does not have insomnia.        Objective:    Physical Exam  Constitutional: She is oriented to person, place, and time. She appears well-developed and well-nourished. No distress.  HENT:  Head: Normocephalic and atraumatic.  Eyes: Conjunctivae are normal.  Neck: Neck supple. No thyromegaly present.  Cardiovascular: Normal rate, regular rhythm and normal heart sounds.  No murmur heard. Pulmonary/Chest: Effort normal and  breath sounds normal. No respiratory distress.  Abdominal: Soft. Bowel sounds are normal. She exhibits no distension and no mass. There is no tenderness.  Musculoskeletal: She exhibits no edema.  Lymphadenopathy:    She has no cervical adenopathy.  Neurological: She is alert and oriented to person, place, and time.  Skin: Skin is warm and dry.  Small puncture wound in hand. No surrounding erythema.  Psychiatric: She has a normal mood and affect. Her behavior is normal.    BP (!) 148/70 (BP Location: Left Arm, Patient Position: Sitting, Cuff Size: Normal)   Pulse 69   Temp 98.2 F (36.8 C) (Oral)   Resp 18   Wt 130 lb 6.4 oz (59.1 kg)   SpO2 97%   BMI 21.70 kg/m  Wt Readings from Last 3 Encounters:  10/13/17 130 lb 6.4 oz (59.1 kg)  10/10/17 128 lb (58.1 kg)  09/17/17 128 lb (58.1 kg)   BP Readings from Last 3 Encounters:  10/13/17 (!) 148/70  10/10/17 136/89  09/17/17 (!) 150/86     Immunization History  Administered Date(s) Administered  . Influenza Split 04/02/2011, 04/01/2012  . Influenza Whole 06/28/2005, 03/25/2008, 03/29/2009, 03/07/2010  . Influenza, High Dose Seasonal PF 04/02/2013, 04/14/2014, 03/31/2015, 04/03/2017  . Influenza,inj,Quad PF,6+ Mos 03/19/2016  . Pneumococcal Conjugate-13 09/24/2013  . Pneumococcal Polysaccharide-23 05/01/2007  . Td 10/13/2017  . Tdap 05/27/2006  . Zoster 09/01/2007    Health Maintenance  Topic Date Due  . INFLUENZA VACCINE  12/25/2017  . TETANUS/TDAP  10/14/2027  . DEXA SCAN  Completed  . PNA vac Low Risk Adult  Completed    Lab Results  Component Value Date   WBC 10.4 10/13/2017   HGB 13.0 10/13/2017   HCT 38.7 10/13/2017   PLT 266.0 10/13/2017   GLUCOSE 89 10/13/2017   CHOL 146 09/12/2016   TRIG  26.0 09/12/2016   HDL 102.30 09/12/2016   LDLCALC 38 09/12/2016   ALT 18 10/13/2017   AST 16 10/13/2017   NA 143 10/13/2017   K 3.6 10/13/2017   CL 105 10/13/2017   CREATININE 1.02 10/13/2017   BUN 19 10/13/2017    CO2 30 10/13/2017   TSH 2.21 10/13/2017   INR 1.00 03/22/2011    Lab Results  Component Value Date   TSH 2.21 10/13/2017   Lab Results  Component Value Date   WBC 10.4 10/13/2017   HGB 13.0 10/13/2017   HCT 38.7 10/13/2017   MCV 92.4 10/13/2017   PLT 266.0 10/13/2017   Lab Results  Component Value Date   NA 143 10/13/2017   K 3.6 10/13/2017   CO2 30 10/13/2017   GLUCOSE 89 10/13/2017   BUN 19 10/13/2017   CREATININE 1.02 10/13/2017   BILITOT 0.4 10/13/2017   ALKPHOS 52 10/13/2017   AST 16 10/13/2017   ALT 18 10/13/2017   PROT 6.2 10/13/2017   ALBUMIN 4.0 10/13/2017   CALCIUM 9.1 10/13/2017   GFR 55.75 (L) 10/13/2017   Lab Results  Component Value Date   CHOL 146 09/12/2016   Lab Results  Component Value Date   HDL 102.30 09/12/2016   Lab Results  Component Value Date   LDLCALC 38 09/12/2016   Lab Results  Component Value Date   TRIG 26.0 09/12/2016   Lab Results  Component Value Date   CHOLHDL 1 09/12/2016   No results found for: HGBA1C       Assessment & Plan:   Problem List Items Addressed This Visit    Vitamin D deficiency    Use a daily supplements      Relevant Orders   Comprehensive metabolic panel (Completed)   VITAMIN D 25 Hydroxy (Vit-D Deficiency, Fractures) (Completed)   GERD - Primary   Relevant Orders   CBC with Differential/Platelet (Completed)   TSH (Completed)   IBS (irritable bowel syndrome)    Doing better with Lomotil prn with good results. 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 Is using Lomotil prn with adequate results      Relevant Orders   CBC with Differential/Platelet (Completed)   Lipase (Completed)   Amylase (Completed)   Osteopenia    Encouraged to get adequate exercise, calcium and vitamin d intake      Preventative health care    Patient encouraged to  maintain heart healthy diet, regular exercise, adequate sleep. Consider daily probiotics. Take medications as prescribed      Contact dermatitis    Try [address] Astringent, Dawn dishwashing liquid. Try Zyrtec daily with outbreaks. Wash clothing in [place]. Finish a course of steroids      Hand injury, left, initial encounter    Punctured left palm of hand. Is given Tdap today      Relevant Orders   Td vaccine greater than or equal to 7yo preservative free IM (Completed)      I have discontinued Kayliegh J. Celestin's NON FORMULARY. I am also having her maintain her vitamin C, Vitamin D3, cetirizine, Probiotic Product (PROBIOTIC PO), CINNAMON PO, Cranberry, TURMERIC PO, metroNIDAZOLE, fluticasone, pantoprazole, Pancrelipase (Lip-Prot-Amyl), diphenoxylate-atropine, estradiol, escitalopram, predniSONE, hydrOXYzine, and b complex vitamins.  No orders of the defined types were placed in this encounter.   CMA  served as Education administrator during this visit. History, Physical and Plan performed by medical provider. Documentation and orders reviewed and attested to.  Penni Homans, MD

## 2017-10-13 NOTE — Assessment & Plan Note (Signed)
Has been seen by the [facility] clinic and she has type 2 and is doing well on current meds and with dietary restrictions as directed. Is using Lomotil prn with adequate results

## 2017-10-13 NOTE — Assessment & Plan Note (Addendum)
Try [address] Astringent, Dawn dishwashing liquid. Try Zyrtec daily with outbreaks. Wash clothing in [place]. Finish a course of steroids

## 2017-10-13 NOTE — Assessment & Plan Note (Signed)
Doing better with Lomotil prn with good results. Avoid offending foods

## 2017-11-26 ENCOUNTER — Telehealth: Payer: Self-pay | Admitting: *Deleted

## 2017-11-26 NOTE — Telephone Encounter (Signed)
Patient is due for Cologuard (see 07/28/17 office note). I have left a voicemail for patient to call back.

## 2017-12-01 NOTE — Telephone Encounter (Signed)
Left voicemail for patient to call back. 

## 2017-12-01 NOTE — Telephone Encounter (Signed)
It can be true with some insurance companies that a positive Cologuard makes the colonoscopy diagnostic rather than screening This is normally not the case for Medicare patients For her I would imagine Medicare would pay for both Cologuard and diagnostic colonoscopy should Cologuard be positive.  I cannot guarantee this 100% Decision can be left to her with the above in mind Okay for colonoscopy in place of Cologuard if this is what she chooses

## 2017-12-01 NOTE — Telephone Encounter (Signed)
Patient states that she was told by a friend after she had a positive cologuard that her insurance would not cover her colonoscopy as a screening and she had to pay $1500. She states if this is the case, she would just prefer to go ahead and have colonoscopy since she has had polyps in the past. Dr Hilarie Fredrickson, can I change order back to colonoscopy rather than cologuard?

## 2017-12-03 NOTE — Telephone Encounter (Signed)
Left voicemail for patient to call back. 

## 2017-12-04 NOTE — Telephone Encounter (Signed)
I have spoken to patient to advise of Dr Vena Rua response. She states that her friend had medicare and was still charged. She would still be more comfortable having traditional colonoscopy. Patient has been scheduled for previsit on 01/28/18 at 130 pm. Patient has also been scheduled for 02/09/18 @ 1130 am. She verbalizes understanding.

## 2018-01-28 ENCOUNTER — Ambulatory Visit (AMBULATORY_SURGERY_CENTER): Payer: Self-pay | Admitting: *Deleted

## 2018-01-28 ENCOUNTER — Other Ambulatory Visit: Payer: Self-pay

## 2018-01-28 ENCOUNTER — Encounter: Payer: Self-pay | Admitting: Internal Medicine

## 2018-01-28 VITALS — Ht 65.0 in | Wt 128.6 lb

## 2018-01-28 DIAGNOSIS — Z1211 Encounter for screening for malignant neoplasm of colon: Secondary | ICD-10-CM

## 2018-01-28 MED ORDER — SUPREP BOWEL PREP KIT 17.5-3.13-1.6 GM/177ML PO SOLN: 1.0000 | Freq: Once | ORAL | 0 refills | Status: AC

## 2018-01-28 NOTE — Progress Notes (Signed)
No egg or soy allergy known to patient  No issues with past sedation with any surgeries  or procedures, no intubation problems  No diet pills per patient No home 02 use per patient  No blood thinners per patient  Pt denies issues with constipation  No A fib or A flutter  EMMI video offered, patient declined.

## 2018-02-09 ENCOUNTER — Encounter: Payer: Self-pay | Admitting: Internal Medicine

## 2018-02-09 ENCOUNTER — Ambulatory Visit (AMBULATORY_SURGERY_CENTER): Payer: Medicare Other | Admitting: Internal Medicine

## 2018-02-09 VITALS — BP 109/68 | HR 70 | Temp 98.4°F | Resp 16 | Ht 65.0 in | Wt 128.0 lb

## 2018-02-09 DIAGNOSIS — D122 Benign neoplasm of ascending colon: Secondary | ICD-10-CM

## 2018-02-09 DIAGNOSIS — Z1211 Encounter for screening for malignant neoplasm of colon: Secondary | ICD-10-CM

## 2018-02-09 MED ORDER — SODIUM CHLORIDE 0.9 % IV SOLN: 500.0000 mL | Freq: Once | INTRAVENOUS | Status: DC

## 2018-02-09 NOTE — Progress Notes (Signed)
Report given to PACU, vss 

## 2018-02-09 NOTE — Progress Notes (Signed)
Pt's states no medical or surgical changes since previsit or office visit. 

## 2018-02-09 NOTE — Patient Instructions (Signed)
Handout given on polyps  YOU HAD AN ENDOSCOPIC PROCEDURE TODAY AT THE [facility]:   Refer to the procedure report that was given to you for any specific questions about what was found during the examination.  If the procedure report does not answer your questions, please call your gastroenterologist to clarify.  If you requested that your care partner not be given the details of your procedure findings, then the procedure report has been included in a sealed envelope for you to review at your convenience later.  YOU SHOULD EXPECT: Some feelings of bloating in the abdomen. Passage of more gas than usual.  Walking can help get rid of the air that was put into your GI tract during the procedure and reduce the bloating. If you had a lower endoscopy (such as a colonoscopy or flexible sigmoidoscopy) you may notice spotting of blood in your stool or on the toilet paper. If you underwent a bowel prep for your procedure, you may not have a normal bowel movement for a few days.  Please Note:  You might notice some irritation and congestion in your nose or some drainage.  This is from the oxygen used during your procedure.  There is no need for concern and it should clear up in a day or so.  SYMPTOMS TO REPORT IMMEDIATELY:   Following lower endoscopy (colonoscopy or flexible sigmoidoscopy):  Excessive amounts of blood in the stool  Significant tenderness or worsening of abdominal pains  Swelling of the abdomen that is new, acute  Fever of 100F or higher    For urgent or emergent issues, a gastroenterologist can be reached at any hour by calling [phone].   DIET:  We do recommend a small meal at first, but then you may proceed to your regular diet.  Drink plenty of fluids but you should avoid alcoholic beverages for 24 hours.  ACTIVITY:  You should plan to take it easy for the rest of today and you should NOT DRIVE or use heavy machinery until tomorrow (because of the sedation  medicines used during the test).    FOLLOW UP: Our staff will call the number listed on your records the next business day following your procedure to check on you and address any questions or concerns that you may have regarding the information given to you following your procedure. If we do not reach you, we will leave a message.  However, if you are feeling well and you are not experiencing any problems, there is no need to return our call.  We will assume that you have returned to your regular daily activities without incident.  If any biopsies were taken you will be contacted by phone or by letter within the next 1-3 weeks.  Please call us at [phone] if you have not heard about the biopsies in 3 weeks.    SIGNATURES/CONFIDENTIALITY: You and/or your care partner have signed paperwork which will be entered into your electronic medical record.  These signatures attest to the fact that that the information above on your After Visit Summary has been reviewed and is understood.  Full responsibility of the confidentiality of this discharge information lies with you and/or your care-partner. 

## 2018-02-09 NOTE — Op Note (Signed)
[facility] Patient Name: [name] Procedure Date: 02/09/2018 11:49 AM MRN: [id number] Endoscopist: Jerene Bears , MD Age: 77 Referring MD:  Date of Birth: May 07, 1941 Gender: Female Account #: [id number] Procedure:                Colonoscopy Indications:              Screening for colorectal malignant neoplasm, Last                            colonoscopy 10 years ago Medicines:                Monitored Anesthesia Care Procedure:                Pre-Anesthesia Assessment:                           - Prior to the procedure, a History and Physical                            was performed, and patient medications and                            allergies were reviewed. The patient's tolerance of                            previous anesthesia was also reviewed. The risks                            and benefits of the procedure and the sedation                            options and risks were discussed with the patient.                            All questions were answered, and informed consent                            was obtained. Prior Anticoagulants: The patient has                            taken no previous anticoagulant or antiplatelet                            agents. ASA Grade Assessment: II - A patient with                            mild systemic disease. After reviewing the risks                            and benefits, the patient was deemed in                            satisfactory condition to undergo the procedure.  After obtaining informed consent, the colonoscope                            was passed under direct vision. Throughout the                            procedure, the patient's blood pressure, pulse, and                            oxygen saturations were monitored continuously. The                            Colonoscope was introduced through the anus and                            advanced to the cecum, identified by  appendiceal                            orifice and ileocecal valve. The colonoscopy was                            performed without difficulty. The patient tolerated                            the procedure well. The quality of the bowel                            preparation was good. The ileocecal valve,                            appendiceal orifice, and rectum were photographed. Scope In: 11:56:24 AM Scope Out: 12:12:31 PM Scope Withdrawal Time: 0 hours 11 minutes 30 seconds  Total Procedure Duration: 0 hours 16 minutes 7 seconds  Findings:                 Skin tags were found on perianal exam.                           Four sessile polyps were found in the ascending                            colon. The polyps were 4 to 8 mm in size. These                            polyps were removed with a cold snare. Resection                            and retrieval were complete.                           Scattered small-mouthed diverticula were found in                            the sigmoid colon and descending colon.  Internal hemorrhoid was found during retroflexion                            in right anterior position. The hemorrhoid was                            small. There was evidence of prior hemorrhoidal                            banding with post banding scarring in the rectum. Complications:            No immediate complications. Estimated Blood Loss:     Estimated blood loss was minimal. Impression:               - Four 4 to 8 mm polyps in the ascending colon,                            removed with a cold snare. Resected and retrieved.                           - Diverticulosis in the sigmoid colon and in the                            descending colon.                           - Small internal hemorrhoid. Recommendation:           - Patient has a contact number available for                            emergencies. The signs and symptoms of potential                             delayed complications were discussed with the                            patient. Return to normal activities tomorrow.                            Written discharge instructions were provided to the                            patient.                           - Resume previous diet.                           - Continue present medications.                           - Await pathology results.                           - Repeat colonoscopy is recommended. The  colonoscopy date will be determined after pathology                            results from today's exam become available for                            review. Jerene Bears, MD 02/09/2018 12:17:57 PM This report has been signed electronically.

## 2018-02-09 NOTE — Progress Notes (Signed)
Called to room to assist during endoscopic procedure.  Patient ID and intended procedure confirmed with present staff. Received instructions for my participation in the procedure from the performing physician.  

## 2018-02-10 ENCOUNTER — Telehealth: Payer: Self-pay

## 2018-02-10 NOTE — Telephone Encounter (Signed)
  Follow up Call-  Call back number 02/09/2018  Post procedure Call Back phone  # [phone] hm  Permission to leave phone message Yes  Some recent data might be hidden     Patient questions:  Do you have a fever, pain , or abdominal swelling? No. Pain Score  0 *  Have you tolerated food without any problems? Yes.    Have you been able to return to your normal activities? Yes.    Do you have any questions about your discharge instructions: Diet   No. Medications  No. Follow up visit  No.  Do you have questions or concerns about your Care? No.  Actions: * If pain score is 4 or above: No action needed, pain <4.

## 2018-02-10 NOTE — Telephone Encounter (Signed)
Left message

## 2018-02-15 ENCOUNTER — Encounter: Payer: Self-pay | Admitting: Internal Medicine

## 2018-02-20 ENCOUNTER — Encounter: Payer: Self-pay | Admitting: *Deleted

## 2018-02-26 ENCOUNTER — Telehealth: Payer: Self-pay | Admitting: Internal Medicine

## 2018-02-26 DIAGNOSIS — Z8719 Personal history of other diseases of the digestive system: Secondary | ICD-10-CM

## 2018-02-26 NOTE — Telephone Encounter (Signed)
Pt states she saw her dermatologist today and she has a nail fungus that Dr. Ubaldo Glassing wants to treat with Fluconazole 200mg  once a week for 6-9 months. Dr. Ubaldo Glassing wanted the patient to make sure Dr. Hilarie Fredrickson is ok with her taking this medication. Please advise.

## 2018-02-26 NOTE — Telephone Encounter (Signed)
I do not see a problem with fluconazole Would consider checking liver enzymes 1 month into therapy and then every 8 weeks or so thereafter while she is on fluconazole therapy

## 2018-02-27 NOTE — Telephone Encounter (Signed)
Pt aware. She will call after being on med for about a month to have labs.

## 2018-03-20 ENCOUNTER — Ambulatory Visit: Payer: Self-pay | Admitting: *Deleted

## 2018-03-20 ENCOUNTER — Other Ambulatory Visit: Payer: Self-pay

## 2018-03-20 ENCOUNTER — Emergency Department (HOSPITAL_BASED_OUTPATIENT_CLINIC_OR_DEPARTMENT_OTHER): Payer: Medicare Other

## 2018-03-20 ENCOUNTER — Ambulatory Visit: Payer: Medicare Other | Admitting: Internal Medicine

## 2018-03-20 ENCOUNTER — Emergency Department (HOSPITAL_BASED_OUTPATIENT_CLINIC_OR_DEPARTMENT_OTHER)
Admission: EM | Admit: 2018-03-20 | Discharge: 2018-03-20 | Disposition: A | Discharge status: Home / Self Care | Payer: Medicare Other | Attending: Emergency Medicine | Admitting: Emergency Medicine

## 2018-03-20 ENCOUNTER — Encounter (HOSPITAL_BASED_OUTPATIENT_CLINIC_OR_DEPARTMENT_OTHER): Payer: Self-pay

## 2018-03-20 DIAGNOSIS — N200 Calculus of kidney: Secondary | ICD-10-CM

## 2018-03-20 DIAGNOSIS — R1011 Right upper quadrant pain: Secondary | ICD-10-CM | POA: Diagnosis present

## 2018-03-20 DIAGNOSIS — R112 Nausea with vomiting, unspecified: Secondary | ICD-10-CM | POA: Insufficient documentation

## 2018-03-20 HISTORY — DX: Calculus of kidney: N20.0

## 2018-03-20 LAB — CBC WITH DIFFERENTIAL/PLATELET
Abs Immature Granulocytes: 0.14 10*3/uL — ABNORMAL HIGH (ref 0.00–0.07)
BASOS ABS: 0 10*3/uL (ref 0.0–0.1)
BASOS PCT: 0 %
EOS PCT: 0 %
Eosinophils Absolute: 0 10*3/uL (ref 0.0–0.5)
HCT: 41.6 % (ref 36.0–46.0)
Hemoglobin: 13.6 g/dL (ref 12.0–15.0)
Immature Granulocytes: 1 %
LYMPHS PCT: 5 %
Lymphs Abs: 0.7 10*3/uL (ref 0.7–4.0)
MCH: 30.2 pg (ref 26.0–34.0)
MCHC: 32.7 g/dL (ref 30.0–36.0)
MCV: 92.2 fL (ref 80.0–100.0)
Monocytes Absolute: 0.3 10*3/uL (ref 0.1–1.0)
Monocytes Relative: 2 %
NEUTROS ABS: 12.1 10*3/uL — AB (ref 1.7–7.7)
NRBC: 0 % (ref 0.0–0.2)
Neutrophils Relative %: 92 %
PLATELETS: 282 10*3/uL (ref 150–400)
RBC: 4.51 MIL/uL (ref 3.87–5.11)
RDW: 12.8 % (ref 11.5–15.5)
WBC: 13.2 10*3/uL — ABNORMAL HIGH (ref 4.0–10.5)

## 2018-03-20 LAB — URINALYSIS, ROUTINE W REFLEX MICROSCOPIC
BILIRUBIN URINE: NEGATIVE
Glucose, UA: 100 mg/dL — AB
KETONES UR: 15 mg/dL — AB
NITRITE: NEGATIVE
PROTEIN: 30 mg/dL — AB
Specific Gravity, Urine: 1.025 (ref 1.005–1.030)
pH: 6 (ref 5.0–8.0)

## 2018-03-20 LAB — COMPREHENSIVE METABOLIC PANEL
ALBUMIN: 3.9 g/dL (ref 3.5–5.0)
ALK PHOS: 58 U/L (ref 38–126)
ALT: 18 U/L (ref 0–44)
AST: 24 U/L (ref 15–41)
Anion gap: 11 (ref 5–15)
BUN: 15 mg/dL (ref 8–23)
CALCIUM: 8.8 mg/dL — AB (ref 8.9–10.3)
CHLORIDE: 103 mmol/L (ref 98–111)
CO2: 25 mmol/L (ref 22–32)
CREATININE: 1.03 mg/dL — AB (ref 0.44–1.00)
GFR calc Af Amer: 59 mL/min — ABNORMAL LOW (ref >60)
GFR calc non Af Amer: 51 mL/min — ABNORMAL LOW (ref >60)
GLUCOSE: 157 mg/dL — AB (ref 70–99)
Potassium: 3.4 mmol/L — ABNORMAL LOW (ref 3.5–5.1)
SODIUM: 139 mmol/L (ref 135–145)
Total Bilirubin: 0.5 mg/dL (ref 0.3–1.2)
Total Protein: 7 g/dL (ref 6.5–8.1)

## 2018-03-20 LAB — URINALYSIS, MICROSCOPIC (REFLEX)

## 2018-03-20 LAB — LIPASE, BLOOD: Lipase: 52 U/L — ABNORMAL HIGH (ref 11–51)

## 2018-03-20 MED ORDER — KETOROLAC TROMETHAMINE 30 MG/ML IJ SOLN
30.0000 mg | Freq: Once | INTRAMUSCULAR | Status: AC
  Administered 2018-03-20: 30 mg via INTRAVENOUS
  Filled 2018-03-20: qty 1

## 2018-03-20 MED ORDER — METOCLOPRAMIDE HCL 5 MG/ML IJ SOLN
10.0000 mg | Freq: Once | INTRAMUSCULAR | Status: AC
  Administered 2018-03-20: 10 mg via INTRAVENOUS
  Filled 2018-03-20: qty 2

## 2018-03-20 MED ORDER — ONDANSETRON HCL 4 MG/2ML IJ SOLN
4.0000 mg | Freq: Once | INTRAMUSCULAR | Status: AC
  Administered 2018-03-20: 4 mg via INTRAVENOUS
  Filled 2018-03-20: qty 2

## 2018-03-20 MED ORDER — TAMSULOSIN HCL 0.4 MG PO CAPS: 0.4000 mg | ORAL_CAPSULE | Freq: Every day | ORAL | 0 refills | Status: AC

## 2018-03-20 MED ORDER — OXYCODONE-ACETAMINOPHEN 5-325 MG PO TABS: 1.0000 | ORAL_TABLET | Freq: Four times a day (QID) | ORAL | 0 refills | Status: DC | PRN

## 2018-03-20 MED ORDER — ONDANSETRON 4 MG PO TBDP: 4.0000 mg | ORAL_TABLET | Freq: Three times a day (TID) | ORAL | 0 refills | Status: DC | PRN

## 2018-03-20 MED FILL — TAMSULOSIN HCL 0.4 MG CAP: 0.4 | 14 days supply | Qty: 14 | Fill #0

## 2018-03-20 MED FILL — ONDANSETRON ODT 4 MG TABLET: 4 | 7 days supply | Qty: 20 | Fill #0

## 2018-03-20 MED FILL — OXYCODONE-ACETAMINOPHEN 5-3: 5-325 | 4 days supply | Qty: 15 | Fill #0

## 2018-03-20 NOTE — ED Triage Notes (Addendum)
C/o right flank/right abd pain started last night-n/v x 1-NAD-steady gait

## 2018-03-20 NOTE — Discharge Instructions (Signed)
You have been seen in the Emergency Department (ED) today for pain that we believe based on your workup, is caused by kidney stones.  As we have discussed, please drink plenty of fluids.  Please make a follow up appointment with the physician(s) listed elsewhere in this documentation. ° °You may take pain medication as needed but ONLY as prescribed.  Please also take your prescribed Flomax daily.  We also recommend that you take over-the-counter ibuprofen regularly according to label instructions over the next 5 days.  Take it with meals to minimize stomach discomfort. ° °Please see your doctor as soon as possible as stones may take 1-3 weeks to pass and you may require additional care or medications. ° °Do not drink alcohol, drive or participate in any other potentially dangerous activities while taking opiate pain medication as it may make you sleepy. Do not take this medication with any other sedating medications, either prescription or over-the-counter. If you were prescribed Percocet or Vicodin, do not take these with acetaminophen (Tylenol) as it is already contained within these medications. °  °Take Percocet as needed for severe pain.  This medication is an opiate (or narcotic) pain medication and can be habit forming.  Use it as little as possible to achieve adequate pain control.  Do not use or use it with extreme caution if you have a history of opiate abuse or dependence.  If you are on a pain contract with your primary care doctor or a pain specialist, be sure to let them know you were prescribed this medication today from the Emergency Department.  This medication is intended for your use only - do not give any to anyone else and keep it in a secure place where nobody else, especially children, have access to it.  It will also cause or worsen constipation, so you may want to consider taking an over-the-counter stool softener while you are taking this medication. ° °Return to the Emergency Department  (ED) or call your doctor if you have any worsening pain, fever, painful urination, are unable to urinate, or develop other symptoms that concern you. ° ° ° °Kidney Stones °Kidney stones (urolithiasis) are deposits that form inside your kidneys. The intense pain is caused by the stone moving through the urinary tract. When the stone moves, the ureter goes into spasm around the stone. The stone is usually passed in the urine.  °CAUSES  °A disorder that makes certain neck glands produce too much parathyroid hormone (primary hyperparathyroidism). °A buildup of uric acid crystals, similar to gout in your joints. °Narrowing (stricture) of the ureter. °A kidney obstruction present at birth (congenital obstruction). °Previous surgery on the kidney or ureters. °Numerous kidney infections. °SYMPTOMS  °Feeling sick to your stomach (nauseous). °Throwing up (vomiting). °Blood in the urine (hematuria). °Pain that usually spreads (radiates) to the groin. °Frequency or urgency of urination. °DIAGNOSIS  °Taking a history and physical exam. °Blood or urine tests. °CT scan. °Occasionally, an examination of the inside of the urinary bladder (cystoscopy) is performed. °TREATMENT  °Observation. °Increasing your fluid intake. °Extracorporeal shock wave lithotripsy--This is a noninvasive procedure that uses shock waves to break up kidney stones. °Surgery may be needed if you have severe pain or persistent obstruction. There are various surgical procedures. Most of the procedures are performed with the use of small instruments. Only small incisions are needed to accommodate these instruments, so recovery time is minimized. °The size, location, and chemical composition are all important variables that will determine the proper   choice of action for you. Talk to your health care provider to better understand your situation so that you will minimize the risk of injury to yourself and your kidney.  °HOME CARE INSTRUCTIONS  °Drink enough water  and fluids to keep your urine clear or pale yellow. This will help you to pass the stone or stone fragments. °Strain all urine through the provided strainer. Keep all particulate matter and stones for your health care provider to see. The stone causing the pain may be as small as a grain of salt. It is very important to use the strainer each and every time you pass your urine. The collection of your stone will allow your health care provider to analyze it and verify that a stone has actually passed. The stone analysis will often identify what you can do to reduce the incidence of recurrences. °Only take over-the-counter or prescription medicines for pain, discomfort, or fever as directed by your health care provider. °Keep all follow-up visits as told by your health care provider. This is important. °Get follow-up X-rays if required. The absence of pain does not always mean that the stone has passed. It may have only stopped moving. If the urine remains completely obstructed, it can cause loss of kidney function or even complete destruction of the kidney. It is your responsibility to make sure X-rays and follow-ups are completed. Ultrasounds of the kidney can show blockages and the status of the kidney. Ultrasounds are not associated with any radiation and can be performed easily in a matter of minutes. °Make changes to your daily diet as told by your health care provider. You may be told to: °Limit the amount of salt that you eat. °Eat 5 or more servings of fruits and vegetables each day. °Limit the amount of meat, poultry, fish, and eggs that you eat. °Collect a 24-hour urine sample as told by your health care provider. You may need to collect another urine sample every 6-12 months. °SEEK MEDICAL CARE IF: °You experience pain that is progressive and unresponsive to any pain medicine you have been prescribed. °SEEK IMMEDIATE MEDICAL CARE IF:  °Pain cannot be controlled with the prescribed medicine. °You have a  fever or shaking chills. °The severity or intensity of pain increases over 18 hours and is not relieved by pain medicine. °You develop a new onset of abdominal pain. °You feel faint or pass out. °You are unable to urinate. °  °This information is not intended to replace advice given to you by your health care provider. Make sure you discuss any questions you have with your health care provider. °  °Document Released: 05/13/2005 Document Revised: 02/01/2015 Document Reviewed: 10/14/2012 °Elsevier Interactive Patient Education ©2016 [employer] ° ° °

## 2018-03-20 NOTE — Progress Notes (Deleted)
Subjective:    Patient ID: [name], female    DOB: 1941-01-04, 77 y.o.   MRN: [id number]  HPI The patient is here for an acute visit.  Back pain:   Medications and allergies reviewed with patient and updated if appropriate.  Patient Active Problem List   Diagnosis Date Noted  . Contact dermatitis 10/13/2017  . Hand injury, left, initial encounter 10/13/2017  . Estrogen deficiency 10/15/2016  . 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 Post-menopause 09/26/2013  . Preventative health care 04/04/2013  . Osteopenia 04/01/2012  . Allergic state 12/31/2011  . Elevated blood pressure 04/16/2011  . Fibrocystic breast   . UTI (lower urinary tract infection) 03/13/2011  . 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 Prednisone adverse reaction 11/16/2010  . Inner ear disease 11/16/2010  . Pain in ear 11/09/2010  . Eustachian tube dysfunction 11/09/2010  . IBS (irritable bowel syndrome) 09/27/2010  . HYPOTENSION, ORTHOSTATIC 02/26/2010  . PALPITATIONS, RECURRENT 02/26/2010  . Full incontinence of feces 06/20/2009  . TOBACCO USE, QUIT 03/29/2009  . CHEST PAIN 12/13/2008  . Abdominal pain, epigastric 12/13/2008  . CONSTIPATION 01/12/2008  . SHOULDER PAIN 12/29/2007  . Anxiety state 08/29/2007  . Vitamin D deficiency 05/01/2007  . Hemorrhoids 05/01/2007  . GERD 05/01/2007  . 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  . OSTEOARTHRITIS 01/10/2007    Current Outpatient Medications on File Prior to Visit  Medication Sig Dispense Refill  . Ascorbic Acid (VITAMIN C) 500 MG tablet Take 500 mg by mouth daily.      Marland Kitchen b complex vitamins tablet Take 1 tablet by mouth daily.    . cetirizine (ZYRTEC) 10 MG tablet Take 10 mg by mouth daily.      . Cholecalciferol (VITAMIN D3) 1000 UNITS CAPS Take 2 tablets by mouth daily.     . Cranberry 500 MG CAPS Take 1 capsule by mouth daily.    . diphenoxylate-atropine (LOMOTIL) 2.5-0.025 MG tablet Take 1 tablet by mouth 4 (four) times daily as needed for diarrhea  or loose stools. 360 tablet 2  . escitalopram (LEXAPRO) 10 MG tablet TAKE 1 TABLET BY MOUTH  DAILY 90 tablet 3  . estradiol (VIVELLE-DOT) 0.025 MG/24HR Apply 1 patch topically to skin twice weekly. 24 patch 11  . fluticasone (FLONASE) 50 MCG/ACT nasal spray Place 2 sprays into both nostrils as needed for rhinitis or allergies. (Patient not taking: Reported on 01/28/2018) 16 g 0  . hydrOXYzine (ATARAX/VISTARIL) 10 MG tablet Take 1 tablet (10 mg total) by mouth at bedtime. (Patient not taking: Reported on 01/28/2018) 30 tablet 0  . metroNIDAZOLE (METROCREAM) 0.75 % cream     . Pancrelipase, Lip-Prot-Amyl, (CREON) 24000-76000 units CPEP TAKE 6 CAPSULES BY MOUTH  WITH EACH MEAL AND 1 TO 2  CAPSULES WITH EACH SNACK 2100 capsule 2  . pantoprazole (PROTONIX) 40 MG tablet TAKE 1 TABLET BY MOUTH  DAILY 90 tablet 3  . Probiotic Product (PROBIOTIC PO) Take by mouth. Takes Probaclac probiotic once daily     . TURMERIC PO Take by mouth daily.     Current Facility-Administered Medications on File Prior to Visit  Medication Dose Route Frequency Provider Last Rate Last Dose  . 0.9 %  sodium chloride infusion  500 mL Intravenous Once Pyrtle, Lajuan Lines, MD        Past Medical History:  Diagnosis Date  . Allergic state   . Allergy   . ANXIETY, CHRONIC   . 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 CHEST PAIN  LHC 02/2011: normal cors, EF 65%  . Contact dermatitis 10/13/2017  . Diverticulosis   . Esophageal reflux   . Fibrocystic breast   . Hand injury, left, initial encounter 10/13/2017  . Hepatic cyst   . HYPOTENSION, ORTHOSTATIC   . IBS (irritable bowel syndrome)   . Internal hemorrhoids without mention of complication   . Kidney lesion   . Liver cyst   . Osteoarthritis   . Osteopenia   . Osteopenia   . Osteoporosis   . Pain in ear   . PALPITATIONS, RECURRENT   . Pancreatitis chronic   . Preventative health care   . PSVT   . 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 SHOULDER PAIN   . TOBACCO  USE, QUIT   . VITAMIN D DEFICIENCY     Past Surgical History:  Procedure Laterality Date  . ABDOMINAL HYSTERECTOMY  1998  . BREAST SURGERY     breast bx on left, benign  . CARDIAC CATHETERIZATION    . CATARACT EXTRACTION, BILATERAL    . CATARACT EXTRACTION, BILATERAL    . CHOLECYSTECTOMY  1999  . COLONOSCOPY    . EYE SURGERY Bilateral 04/2017, 05/2017   cataracts  . [place]  . TUBAL LIGATION     with D&C    Social History   Socioeconomic History  . Marital status: Married    Spouse name: Not on file  . Number of children: 1  . Years of education: Not on file  . Highest education level: Not on file  Occupational History  . Occupation: Retired    Fish farm manager: RETIRED  Social Needs  . Financial resource strain: Not on file  . Food insecurity:    Worry: Not on file    Inability: Not on file  . Transportation needs:    Medical: Not on file    Non-medical: Not on file  Tobacco Use  . Smoking status: Former Smoker    Types: Cigarettes    Last attempt to quit: 05/27/1973    Years since quitting: 44.8  . Smokeless tobacco: Never Used  Substance and Sexual Activity  . Alcohol use: No  . Drug use: No  . Sexual activity: Yes  Lifestyle  . Physical activity:    Days per week: Not on file    Minutes per session: Not on file  . Stress: Not on file  Relationships  . Social connections:    Talks on phone: Not on file    Gets together: Not on file    Attends religious service: Not on file    Active member of club or organization: Not on file    Attends meetings of clubs or organizations: Not on file    Relationship status: Not on file  Other Topics Concern  . Not on file  Social History Narrative  . Not on file    Family History  Problem Relation Age of Onset  . Hypertension Mother   . Stroke Mother   . Alcohol abuse Mother   . Heart disease Father   . Bladder Cancer Father   . Diabetes Father   . Hypothyroidism Father   . Dementia Father   . Uterine  cancer Paternal Grandmother   . Heart disease Sister        rheumatic fever at 6 months, cardiomegaly MI  . Heart disease Maternal Grandmother   . Stroke Maternal Grandmother   . Heart disease Maternal Grandfather   . Stroke Maternal Grandfather   .  Breast cancer Cousin   . Pancreatic cancer Paternal Aunt   . Stomach cancer Paternal Uncle   . Breast cancer Paternal Aunt   . Lung cancer Paternal Uncle   . Crohn's disease Son   . Colon cancer Neg Hx   . Colon polyps Neg Hx   . Esophageal cancer Neg Hx   . Rectal cancer Neg Hx     Review of Systems     Objective:  There were no vitals filed for this visit. BP Readings from Last 3 Encounters:  02/09/18 109/68  10/13/17 (!) 148/70  10/10/17 136/89   Wt Readings from Last 3 Encounters:  02/09/18 128 lb (58.1 kg)  01/28/18 128 lb 9.6 oz (58.3 kg)  10/13/17 130 lb 6.4 oz (59.1 kg)   There is no height or weight on file to calculate BMI.   Physical Exam         Assessment & Plan:    See Problem List for Assessment and Plan of chronic medical problems.

## 2018-03-20 NOTE — ED Provider Notes (Signed)
Emergency Department Provider Note   I have reviewed the triage vital signs and the nursing notes.   HISTORY  Chief Complaint Flank Pain   HPI [name] is a 77 y.o. female with PMH of diverticulosis, GERD, IBS, and recurrent palpitations presents to the emergency department with severe, right flank pain.  Symptoms began last night and have worsened throughout the day today.  Patient denies any fevers or chills.  No change in urine color.  No dysuria, hesitancy, urgency.  No clear modifying factors.  The patient states the pain becomes intermittently severe.  She has been told that she has stones in the kidneys by providers in the past but has never passed a stone kidney stone.  She has had bowel movements today.  She is having some nausea and vomiting.  No blood in the emesis.  Past Medical History:  Diagnosis Date  . Allergic state   . Allergy   . ANXIETY, CHRONIC   . 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 CHEST PAIN    LHC 02/2011: normal cors, EF 65%  . Contact dermatitis 10/13/2017  . Diverticulosis   . Esophageal reflux   . Fibrocystic breast   . Hand injury, left, initial encounter 10/13/2017  . Hepatic cyst   . HYPOTENSION, ORTHOSTATIC   . IBS (irritable bowel syndrome)   . Internal hemorrhoids without mention of complication   . Kidney lesion   . Kidney stone   . Liver cyst   . Osteoarthritis   . Osteopenia   . Osteopenia   . Osteoporosis   . Pain in ear   . PALPITATIONS, RECURRENT   . Pancreatitis chronic   . Preventative health care   . PSVT   . 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 SHOULDER PAIN   . TOBACCO USE, QUIT   . VITAMIN D DEFICIENCY     Patient Active Problem List   Diagnosis Date Noted  . Contact dermatitis 10/13/2017  . Hand injury, left, initial encounter 10/13/2017  . Estrogen deficiency 10/15/2016  . 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 Post-menopause 09/26/2013  . Preventative health care 04/04/2013  .  Osteopenia 04/01/2012  . Allergic state 12/31/2011  . Elevated blood pressure 04/16/2011  . Fibrocystic breast   . UTI (lower urinary tract infection) 03/13/2011  . 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 Prednisone adverse reaction 11/16/2010  . Inner ear disease 11/16/2010  . Pain in ear 11/09/2010  . Eustachian tube dysfunction 11/09/2010  . IBS (irritable bowel syndrome) 09/27/2010  . HYPOTENSION, ORTHOSTATIC 02/26/2010  . PALPITATIONS, RECURRENT 02/26/2010  . Full incontinence of feces 06/20/2009  . TOBACCO USE, QUIT 03/29/2009  . CHEST PAIN 12/13/2008  . Abdominal pain, epigastric 12/13/2008  . CONSTIPATION 01/12/2008  . SHOULDER PAIN 12/29/2007  . Anxiety state 08/29/2007  . Vitamin D deficiency 05/01/2007  . Hemorrhoids 05/01/2007  . GERD 05/01/2007  . 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  . OSTEOARTHRITIS 01/10/2007    Past Surgical History:  Procedure Laterality Date  . ABDOMINAL HYSTERECTOMY  1998  . BREAST SURGERY     breast bx on left, benign  . CARDIAC CATHETERIZATION    . CATARACT EXTRACTION, BILATERAL    . CATARACT EXTRACTION, BILATERAL    . CHOLECYSTECTOMY  1999  . COLONOSCOPY    . EYE SURGERY Bilateral 04/2017, 05/2017   cataracts  . [place]  . TUBAL LIGATION     with D&C    Allergies Amoxicillin; Azithromycin; Ciprofloxacin; Epinephrine; Penicillins; Sulfonamide derivatives;  and Tamiflu [oseltamivir phosphate]  Family History  Problem Relation Age of Onset  . Hypertension Mother   . Stroke Mother   . Alcohol abuse Mother   . Heart disease Father   . Bladder Cancer Father   . Diabetes Father   . Hypothyroidism Father   . Dementia Father   . Uterine cancer Paternal Grandmother   . Heart disease Sister        rheumatic fever at 6 months, cardiomegaly MI  . Heart disease Maternal Grandmother   . Stroke Maternal Grandmother   . Heart disease Maternal Grandfather   . Stroke Maternal Grandfather   . Breast cancer Cousin   . Pancreatic cancer  Paternal Aunt   . Stomach cancer Paternal Uncle   . Breast cancer Paternal Aunt   . Lung cancer Paternal Uncle   . Crohn's disease Son   . Colon cancer Neg Hx   . Colon polyps Neg Hx   . Esophageal cancer Neg Hx   . Rectal cancer Neg Hx     Social History Social History   Tobacco Use  . Smoking status: Former Smoker    Types: Cigarettes    Last attempt to quit: 05/27/1973    Years since quitting: 44.8  . Smokeless tobacco: Never Used  Substance Use Topics  . Alcohol use: No  . Drug use: No    Review of Systems  Constitutional: No fever/chills Eyes: No visual changes. ENT: No sore throat. Cardiovascular: Denies chest pain. Respiratory: Denies shortness of breath. Gastrointestinal: No abdominal pain. Positive right flank pain. Positive nausea and vomiting.  No diarrhea.  No constipation. Genitourinary: Negative for dysuria. Musculoskeletal: Negative for back pain. Skin: Negative for rash. Neurological: Negative for headaches, focal weakness or numbness.  10-point ROS otherwise negative.  ____________________________________________   PHYSICAL EXAM:  VITAL SIGNS: ED Triage Vitals  Enc Vitals Group     BP 03/20/18 1334 (!) 179/91     Pulse Rate 03/20/18 1334 72     Resp 03/20/18 1334 20     Temp 03/20/18 1334 98.4 F (36.9 C)     Temp Source 03/20/18 1334 Oral     SpO2 03/20/18 1334 100 %     Weight 03/20/18 1336 127 lb (57.6 kg)     Height 03/20/18 1336 5\' 5"  (1.651 m)     Pain Score 03/20/18 1332 10   Constitutional: Alert and oriented. Patient does appear uncomfortable.  Eyes: Conjunctivae are normal.  Head: Atraumatic. Nose: No congestion/rhinnorhea. Mouth/Throat: Mucous membranes are moist. Neck: No stridor.  Cardiovascular: Normal rate, regular rhythm. Good peripheral circulation. Grossly normal heart sounds.   Respiratory: Normal respiratory effort.  No retractions. Lungs CTAB. Gastrointestinal: Soft and nontender. No distention.  Musculoskeletal:  No lower extremity tenderness nor edema. No gross deformities of extremities. Neurologic:  Normal speech and language. No gross focal neurologic deficits are appreciated.  Skin:  Skin is warm, dry and intact. No rash noted.  ____________________________________________   LABS (all labs ordered are listed, but only abnormal results are displayed)  Labs Reviewed  COMPREHENSIVE METABOLIC PANEL - Abnormal; Notable for the following components:      Result Value   Potassium 3.4 (*)    Glucose, Bld 157 (*)    Creatinine, Ser 1.03 (*)    Calcium 8.8 (*)    GFR calc non Af Amer 51 (*)    GFR calc Af Amer 59 (*)    All other components within normal limits  LIPASE, BLOOD - Abnormal;  Notable for the following components:   Lipase 52 (*)    All other components within normal limits  CBC WITH DIFFERENTIAL/PLATELET - Abnormal; Notable for the following components:   WBC 13.2 (*)    Neutro Abs 12.1 (*)    Abs Immature Granulocytes 0.14 (*)    All other components within normal limits  URINALYSIS, ROUTINE W REFLEX MICROSCOPIC - Abnormal; Notable for the following components:   Glucose, UA 100 (*)    Hgb urine dipstick LARGE (*)    Ketones, ur 15 (*)    Protein, ur 30 (*)    Leukocytes, UA SMALL (*)    All other components within normal limits  URINALYSIS, MICROSCOPIC (REFLEX) - Abnormal; Notable for the following components:   Bacteria, UA FEW (*)    All other components within normal limits  URINE CULTURE   ____________________________________________  RADIOLOGY  Ct Renal Stone Study  Result Date: 03/20/2018 CLINICAL DATA:  RIGHT flank pain since last night with nausea and vomiting, history of autoimmune sclerosing pancreatitis, irritable bowel syndrome, kidney stones, former smoker EXAM: CT ABDOMEN AND PELVIS WITHOUT CONTRAST TECHNIQUE: Multidetector CT imaging of the abdomen and pelvis was performed following the standard protocol without IV contrast. Sagittal and coronal MPR images  reconstructed from axial data set. Oral contrast was not administered COMPARISON:  04/18/2014, 12/14/2008 FINDINGS: Lower chest: Bibasilar atelectasis Hepatobiliary: Cyst RIGHT lobe liver 13 x 11 mm image 32 unchanged. Liver otherwise unremarkable. Gallbladder surgically absent. Pancreas: Normal appearance Spleen: Normal appearance Adrenals/Urinary Tract: Adrenal glands normal appearance. Mildly hyperdense nodules within RIGHT kidney again identified likely hyperdense cysts, largest posterior mid kidney 9 mm diameter unchanged. New RIGHT hydronephrosis and hydroureter secondary to a 5 mm distal RIGHT ureteral calculus image 63. No renal calculi, LEFT hydronephrosis or LEFT hydroureter. Bladder decompressed. Stomach/Bowel: Minimal distal colonic diverticulosis without evidence of diverticulitis. Normal appendix. Stomach and bowel loops otherwise normal appearance. Vascular/Lymphatic: Numerous pelvic phleboliths. Aorta normal caliber. Minimal atherosclerotic calcification aorta. No adenopathy. Reproductive: Uterus surgically absent. Nonvisualization of ovaries. Other: No free air or free fluid. Tiny umbilical hernia containing fat. Musculoskeletal: Bones demineralized IMPRESSION: RIGHT hydronephrosis and hydroureter secondary to a 5 mm distal RIGHT ureteral calculus. Minimal distal colonic diverticulosis without evidence of diverticulitis. No other significant intra-abdominal or intrapelvic abnormalities. Electronically Signed   By: Lavonia Dana M.D.   On: 03/20/2018 14:12    ____________________________________________   PROCEDURES  Procedure(s) performed:   Procedures  None ____________________________________________   INITIAL IMPRESSION / ASSESSMENT AND PLAN / ED COURSE  Pertinent labs & imaging results that were available during my care of the patient were reviewed by me and considered in my medical decision making (see chart for details).  Patient presents to the emergency department with  severe right flank pain.  CT renal shows a 5 mm distal ureteral stone with hydronephrosis.  Labs are pending with WBC count elevated on CBC.  UA is pending. Patient with vomiting here in the ED. No signs/symptoms to suspect developing urosepsis.   CT reviewed with 5 mm stone on the right with hydro. Labs are at baseline including mild elevated lipase which is unchanged from prior. Patient with no symptoms to suspect pancreatitis. Pain is well controlled here. Patient tolerating PO without difficulty. Plan for expectant mgmt at home with plan to schedule Urology appointment for next week. Discussed return precautions in detail with patient and husband at bedside.   At this time, I do not feel there is any life-threatening condition present. I have  reviewed and discussed all results (EKG, imaging, lab, urine as appropriate), exam findings with patient. I have reviewed nursing notes and appropriate previous records.  I feel the patient is safe to be discharged home without further emergent workup. Discussed usual and customary return precautions. Patient and family (if present) verbalize understanding and are comfortable with this plan.  Patient will follow-up with their primary care provider. If they do not have a primary care provider, information for follow-up has been provided to them. All questions have been answered.  ____________________________________________  FINAL CLINICAL IMPRESSION(S) / ED DIAGNOSES  Final diagnoses:  Right kidney stone  Non-intractable vomiting with nausea, unspecified vomiting type     MEDICATIONS GIVEN DURING THIS VISIT:  Medications  ketorolac (TORADOL) 30 MG/ML injection 30 mg (30 mg Intravenous Given 03/20/18 1412)  ondansetron (ZOFRAN) injection 4 mg (4 mg Intravenous Given 03/20/18 1412)  metoCLOPramide (REGLAN) injection 10 mg (10 mg Intravenous Given 03/20/18 1425)     NEW OUTPATIENT MEDICATIONS STARTED DURING THIS VISIT:  Discharge Medication List as  of 03/20/2018  3:07 PM    START taking these medications   Details  ondansetron (ZOFRAN ODT) 4 MG disintegrating tablet Take 1 tablet (4 mg total) by mouth every 8 (eight) hours as needed for nausea or vomiting., Starting Fri 03/20/2018, Print    oxyCODONE-acetaminophen (PERCOCET/ROXICET) 5-325 MG tablet Take 1 tablet by mouth every 6 (six) hours as needed for severe pain., Starting Fri 03/20/2018, Print    tamsulosin (FLOMAX) 0.4 MG CAPS capsule Take 1 capsule (0.4 mg total) by mouth daily for 14 days., Starting Fri 03/20/2018, Until Fri 04/03/2018, Print        Note:  This document was prepared using Dragon voice recognition software and may include unintentional dictation errors.  Nanda Quinton, MD Emergency Medicine    Revanth Neidig, [facility], MD 03/20/18 [place]

## 2018-03-20 NOTE — Telephone Encounter (Signed)
Pt calling with complaints of severe back pain on the right side that goes into her groin. Pt states that the pain started last night and became worse today. Pt states she has been breaking out into sweats and has been experiencing nausea. Pt states that she ate cereal today and threw up and is not able to hold anything down. Pt called asking if she should keep appt at 3:45pm today scheduled previously at the [facility] location or go to the ED. Pt advised to seek treatment in the ED due to severity of current symptoms. Pt verbalized understanding.Appt for 3:45 pm today cancelled and pt is aware. Reason for Disposition . [1] SEVERE pain (e.g., excruciating, scale 8-10) AND [2] present > 1 hour  Answer Assessment - Initial Assessment Questions 1. LOCATION: "Where does it hurt?" (e.g., left, right)     right 2. ONSET: "When did the pain start?"     Last night and has become worse today 3. SEVERITY: "How bad is the pain?" (e.g., Scale 1-10; mild, moderate, or severe)   - MILD (1-3): doesn't interfere with normal activities    - MODERATE (4-7): interferes with normal activities or awakens from sleep    - SEVERE (8-10): excruciating pain and patient unable to do normal activities (stays in bed)       10 4. PATTERN: "Does the pain come and go, or is it constant?"      constant 5. CAUSE: "What do you think is causing the pain?"     Pt was told that she has kidney stones but does not know if this is causing the pain 6. OTHER SYMPTOMS:  "Do you have any other symptoms?" (e.g., fever, abdominal pain, vomiting, leg weakness, burning with urination, blood in urine)     Pain on right side of back and also in groin 7. PREGNANCY:  "Is there any chance you are pregnant?" "When was your last menstrual period?"     n/a  Protocols used: FLANK PAIN-A-AH

## 2018-03-21 LAB — URINE CULTURE: Special Requests: NORMAL

## 2018-03-23 ENCOUNTER — Telehealth: Payer: Self-pay | Admitting: Internal Medicine

## 2018-03-23 ENCOUNTER — Ambulatory Visit: Payer: Medicare Other | Admitting: Family Medicine

## 2018-03-23 ENCOUNTER — Other Ambulatory Visit: Payer: Self-pay

## 2018-03-23 DIAGNOSIS — K861 Other chronic pancreatitis: Secondary | ICD-10-CM

## 2018-03-23 NOTE — Telephone Encounter (Signed)
Message left for pt to have lab work; order already in [employer];

## 2018-03-23 NOTE — Telephone Encounter (Signed)
Patient returning nurses call

## 2018-03-23 NOTE — Telephone Encounter (Signed)
Left message for patient to call back (labwork);

## 2018-03-24 ENCOUNTER — Other Ambulatory Visit (INDEPENDENT_AMBULATORY_CARE_PROVIDER_SITE_OTHER): Payer: Medicare Other

## 2018-03-24 ENCOUNTER — Other Ambulatory Visit: Payer: Self-pay

## 2018-03-24 DIAGNOSIS — B351 Tinea unguium: Secondary | ICD-10-CM

## 2018-03-24 DIAGNOSIS — K861 Other chronic pancreatitis: Secondary | ICD-10-CM | POA: Diagnosis not present

## 2018-03-24 LAB — HEPATIC FUNCTION PANEL
ALBUMIN: 3.8 g/dL (ref 3.5–5.2)
ALK PHOS: 53 U/L (ref 39–117)
ALT: 12 U/L (ref 0–35)
AST: 15 U/L (ref 0–37)
BILIRUBIN DIRECT: 0.1 mg/dL (ref 0.0–0.3)
TOTAL PROTEIN: 6.3 g/dL (ref 6.0–8.3)
Total Bilirubin: 0.3 mg/dL (ref 0.2–1.2)

## 2018-04-20 ENCOUNTER — Other Ambulatory Visit (INDEPENDENT_AMBULATORY_CARE_PROVIDER_SITE_OTHER): Payer: Medicare Other

## 2018-04-20 DIAGNOSIS — B351 Tinea unguium: Secondary | ICD-10-CM | POA: Diagnosis not present

## 2018-04-20 LAB — HEPATIC FUNCTION PANEL
ALK PHOS: 57 U/L (ref 39–117)
ALT: 14 U/L (ref 0–35)
AST: 16 U/L (ref 0–37)
Albumin: 4 g/dL (ref 3.5–5.2)
BILIRUBIN TOTAL: 0.4 mg/dL (ref 0.2–1.2)
Bilirubin, Direct: 0.1 mg/dL (ref 0.0–0.3)
Total Protein: 6.4 g/dL (ref 6.0–8.3)

## 2018-05-25 ENCOUNTER — Other Ambulatory Visit (INDEPENDENT_AMBULATORY_CARE_PROVIDER_SITE_OTHER): Payer: Medicare Other

## 2018-05-25 DIAGNOSIS — Z8719 Personal history of other diseases of the digestive system: Secondary | ICD-10-CM | POA: Diagnosis not present

## 2018-05-25 LAB — HEPATIC FUNCTION PANEL
ALT: 17 U/L (ref 0–35)
AST: 19 U/L (ref 0–37)
Albumin: 4 g/dL (ref 3.5–5.2)
Alkaline Phosphatase: 64 U/L (ref 39–117)
BILIRUBIN TOTAL: 0.3 mg/dL (ref 0.2–1.2)
Bilirubin, Direct: 0.1 mg/dL (ref 0.0–0.3)
Total Protein: 6.3 g/dL (ref 6.0–8.3)

## 2018-05-25 LAB — HM MAMMOGRAPHY

## 2018-05-28 ENCOUNTER — Other Ambulatory Visit: Payer: Self-pay

## 2018-05-28 ENCOUNTER — Telehealth: Payer: Self-pay | Admitting: Internal Medicine

## 2018-05-28 DIAGNOSIS — R945 Abnormal results of liver function studies: Principal | ICD-10-CM

## 2018-05-28 DIAGNOSIS — R7989 Other specified abnormal findings of blood chemistry: Secondary | ICD-10-CM

## 2018-05-28 NOTE — Telephone Encounter (Signed)
Pt had blood test done for liver enzyme on 05/25/18.  Pt would like to know if she should continue taking fluconazole for foot fungus.  Pt says it's OK to leave VM.

## 2018-05-28 NOTE — Telephone Encounter (Signed)
Pt wants to know if she should continue taking fluconazole for toenail fungus.  Pt gets monthly liver enzyme tests.  Last test 05/25/18.

## 2018-05-28 NOTE — Telephone Encounter (Signed)
See lab results and please advise.

## 2018-05-28 NOTE — Telephone Encounter (Signed)
See result note.  

## 2018-05-28 NOTE — Telephone Encounter (Signed)
Yes, see result note

## 2018-05-28 NOTE — Telephone Encounter (Signed)
See result note, pt aware 

## 2018-05-29 ENCOUNTER — Telehealth: Payer: Self-pay | Admitting: *Deleted

## 2018-05-29 NOTE — Telephone Encounter (Signed)
Received Mammogram results from [place]; forwarded to provider/SLS 01/03

## 2018-05-30 ENCOUNTER — Encounter: Payer: Self-pay | Admitting: Family Medicine

## 2018-05-30 ENCOUNTER — Ambulatory Visit (INDEPENDENT_AMBULATORY_CARE_PROVIDER_SITE_OTHER): Payer: Medicare Other | Admitting: Family Medicine

## 2018-05-30 VITALS — BP 142/84 | HR 79 | Temp 97.9°F | Wt 126.0 lb

## 2018-05-30 DIAGNOSIS — H6981 Other specified disorders of Eustachian tube, right ear: Secondary | ICD-10-CM | POA: Diagnosis not present

## 2018-05-30 DIAGNOSIS — J3089 Other allergic rhinitis: Secondary | ICD-10-CM | POA: Diagnosis not present

## 2018-05-30 MED ORDER — MOMETASONE FUROATE 50 MCG/ACT NA SUSP: 2.0000 | Freq: Every day | NASAL | 12 refills | Status: DC

## 2018-05-30 MED ORDER — AZELASTINE HCL 0.1 % NA SOLN: 1.0000 | Freq: Two times a day (BID) | NASAL | 12 refills | Status: DC

## 2018-05-30 NOTE — Progress Notes (Signed)
Subjective  CC:  Chief Complaint  Patient presents with  . Cough    x 1 week---has improved  . Ear Fullness    bilateral, right is worse.... intermittent sharp pain...pt has tried OTC Flonase and allergy medicine   Evaluated today at the [facility].   HPI: [name] is a 78 y.o. female who presents to the office today to address the problems listed above in the chief complaint.  Very pleasant 78 year old female with chronic allergies presents for nasal congestion and right ear fullness with occasional sharp pains.  Ongoing for about a week.  She had minor cough.  Some postnasal drainage.  However she does not feel sick.  No fevers, chills, sore throat.  She has moderate to severe allergies on Zyrtec and Flonase daily.  No myalgias.  I reviewed her chart. Assessment  1. Dysfunction of right eustachian tube   2. Perennial allergic rhinitis      Plan   Eustachian tube dysfunction chronic allergies: Education given.  No signs of current infection.  Will step up allergy treatment by changing to Nasonex and adding Astelin.  Eustachian tube dysfunction discussed.  See after visit summary for further education.  Valsalva maneuvers recommended.  Mucinex if needed.  Follow up: As needed Visit date not found  No orders of the defined types were placed in this encounter.  Meds ordered this encounter  Medications  . mometasone (NASONEX) 50 MCG/ACT nasal spray    Sig: Place 2 sprays into the nose daily.    Dispense:  17 g    Refill:  12  . azelastine (ASTELIN) 0.1 % nasal spray    Sig: Place 1 spray into both nostrils 2 (two) times daily. Use in each nostril as directed    Dispense:  30 mL    Refill:  12      I reviewed the patients updated PMH, FH, and SocHx.    Patient Active Problem List   Diagnosis Date Noted  . Contact dermatitis 10/13/2017  . Hand injury, left, initial encounter 10/13/2017  . Estrogen deficiency 10/15/2016  . SCC (squamous cell  carcinoma) 08/13/2015  . Post-menopause 09/26/2013  . Preventative health care 04/04/2013  . Osteopenia 04/01/2012  . Allergic state 12/31/2011  . Elevated blood pressure 04/16/2011  . Fibrocystic breast   . UTI (lower urinary tract infection) 03/13/2011  . 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 Prednisone adverse reaction 11/16/2010  . Inner ear disease 11/16/2010  . Pain in ear 11/09/2010  . Eustachian tube dysfunction 11/09/2010  . IBS (irritable bowel syndrome) 09/27/2010  . HYPOTENSION, ORTHOSTATIC 02/26/2010  . PALPITATIONS, RECURRENT 02/26/2010  . Full incontinence of feces 06/20/2009  . TOBACCO USE, QUIT 03/29/2009  . CHEST PAIN 12/13/2008  . Abdominal pain, epigastric 12/13/2008  . CONSTIPATION 01/12/2008  . SHOULDER PAIN 12/29/2007  . Anxiety state 08/29/2007  . Vitamin D deficiency 05/01/2007  . Hemorrhoids 05/01/2007  . GERD 05/01/2007  . 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  . OSTEOARTHRITIS 01/10/2007   Current Meds  Medication Sig  . Ascorbic Acid (VITAMIN C) 500 MG tablet Take 500 mg by mouth daily.    Marland Kitchen b complex vitamins tablet Take 1 tablet by mouth daily.  . cetirizine (ZYRTEC) 10 MG tablet Take 10 mg by mouth daily.    . Cholecalciferol (VITAMIN D3) 1000 UNITS CAPS Take 2 tablets by mouth daily.   . Cranberry 500 MG CAPS Take 1 capsule by mouth daily.  . diphenoxylate-atropine (LOMOTIL) 2.5-0.025 MG tablet Take  1 tablet by mouth 4 (four) times daily as needed for diarrhea or loose stools.  Marland Kitchen escitalopram (LEXAPRO) 10 MG tablet TAKE 1 TABLET BY MOUTH  DAILY  . estradiol (VIVELLE-DOT) 0.025 MG/24HR Apply 1 patch topically to skin twice weekly.  . fluticasone (FLONASE) 50 MCG/ACT nasal spray Place 2 sprays into both nostrils as needed for rhinitis or allergies.  . hydrOXYzine (ATARAX/VISTARIL) 10 MG tablet Take 1 tablet (10 mg total) by mouth at bedtime.  . metroNIDAZOLE (METROCREAM) 0.75 % cream   . ondansetron (ZOFRAN ODT) 4 MG disintegrating tablet Take 1 tablet (4 mg  total) by mouth every 8 (eight) hours as needed for nausea or vomiting.  Marland Kitchen oxyCODONE-acetaminophen (PERCOCET/ROXICET) 5-325 MG tablet Take 1 tablet by mouth every 6 (six) hours as needed for severe pain.  . Pancrelipase, Lip-Prot-Amyl, (CREON) 24000-76000 units CPEP TAKE 6 CAPSULES BY MOUTH  WITH EACH MEAL AND 1 TO 2  CAPSULES WITH EACH SNACK  . pantoprazole (PROTONIX) 40 MG tablet TAKE 1 TABLET BY MOUTH  DAILY  . Probiotic Product (PROBIOTIC PO) Take by mouth. Takes Probaclac probiotic once daily   . TURMERIC PO Take by mouth daily.   Current Facility-Administered Medications for the 05/30/18 encounter (Office Visit) with Leamon Arnt, MD  Medication  . 0.9 %  sodium chloride infusion    Allergies: Patient is allergic to amoxicillin; azithromycin; ciprofloxacin; epinephrine; penicillins; sulfonamide derivatives; and tamiflu [oseltamivir phosphate]. Family History: Patient family history includes Alcohol abuse in her mother; Bladder Cancer in her father; Breast cancer in her cousin and paternal aunt; Crohn's disease in her son; Dementia in her father; Diabetes in her father; Heart disease in her father, maternal grandfather, maternal grandmother, and sister; Hypertension in her mother; Hypothyroidism in her father; Lung cancer in her paternal uncle; Pancreatic cancer in her paternal aunt; Stomach cancer in her paternal uncle; Stroke in her maternal grandfather, maternal grandmother, and mother; Uterine cancer in her paternal grandmother. Social History:  Patient  reports that she quit smoking about 45 years ago. Her smoking use included cigarettes. She has never used smokeless tobacco. She reports that she does not drink alcohol or use drugs.  Review of Systems: Constitutional: Negative for fever malaise or anorexia Cardiovascular: negative for chest pain Respiratory: negative for SOB or persistent cough Gastrointestinal: negative for abdominal pain  Objective  Vitals: BP (!) 142/84    Pulse 79   Temp 97.9 F (36.6 C) (Oral)   Wt 126 lb (57.2 kg)   SpO2 97%   BMI 20.97 kg/m  General: no acute distress , A&Ox3, well-appearing HEENT: PEERL, conjunctiva normal, nasal congestion present, bilateral TMs normal appearing oropharynx moist,neck is supple Cardiovascular:  RRR without murmur or gallop.  Respiratory:  Good breath sounds bilaterally, CTAB with normal respiratory effort Skin:  Warm, no rashes     Commons side effects, risks, benefits, and alternatives for medications and treatment plan prescribed today were discussed, and the patient expressed understanding of the given instructions. Patient is instructed to call or message via MyChart if he/she has any questions or concerns regarding our treatment plan. No barriers to understanding were identified. We discussed Red Flag symptoms and signs in detail. Patient expressed understanding regarding what to do in case of urgent or emergency type symptoms.   Medication list was reconciled, printed and provided to the patient in AVS. Patient instructions and summary information was reviewed with the patient as documented in the AVS. This note was prepared with assistance of Dragon voice recognition software.  Occasional wrong-word or sound-a-like substitutions may have occurred due to the inherent limitations of voice recognition software

## 2018-05-30 NOTE — Patient Instructions (Addendum)
Please follow up if symptoms do not improve or as needed.  We are trying Nasonex today to see if that is more helpful than the flonase. Also please try the astelin nasal spray to help with the congestion.   Eustachian Tube Dysfunction  Eustachian tube dysfunction refers to a condition in which a blockage develops in the narrow passage that connects the middle ear to the back of the nose (eustachian tube). The eustachian tube regulates air pressure in the middle ear by letting air move between the ear and nose. It also helps to drain fluid from the middle ear space. Eustachian tube dysfunction can affect one or both ears. When the eustachian tube does not function properly, air pressure, fluid, or both can build up in the middle ear. What are the causes? This condition occurs when the eustachian tube becomes blocked or cannot open normally. Common causes of this condition include:  Ear infections.  Colds and other infections that affect the nose, mouth, and throat (upper respiratory tract).  Allergies.  Irritation from cigarette smoke.  Irritation from stomach acid coming up into the esophagus (gastroesophageal reflux). The esophagus is the tube that carries food from the mouth to the stomach.  Sudden changes in air pressure, such as from descending in an airplane or scuba diving.  Abnormal growths in the nose or throat, such as: ? Growths that line the nose (nasal polyps). ? Abnormal growth of cells (tumors). ? Enlarged tissue at the back of the throat (adenoids). What increases the risk? You are more likely to develop this condition if:  You smoke.  You are overweight.  You are a child who has: ? Certain birth defects of the mouth, such as cleft palate. ? Large tonsils or adenoids. What are the signs or symptoms? Common symptoms of this condition include:  A feeling of fullness in the ear.  Ear pain.  Clicking or popping noises in the ear.  Ringing in the ear.  Hearing  loss.  Loss of balance.  Dizziness. Symptoms may get worse when the air pressure around you changes, such as when you travel to an area of high elevation, fly on an airplane, or go scuba diving. How is this diagnosed? This condition may be diagnosed based on:  Your symptoms.  A physical exam of your ears, nose, and throat.  Tests, such as those that measure: ? The movement of your eardrum (tympanogram). ? Your hearing (audiometry). How is this treated? Treatment depends on the cause and severity of your condition.  In mild cases, you may relieve your symptoms by moving air into your ears. This is called "popping the ears."  In more severe cases, or if you have symptoms of fluid in your ears, treatment may include: ? Medicines to relieve congestion (decongestants). ? Medicines that treat allergies (antihistamines). ? Nasal sprays or ear drops that contain medicines that reduce swelling (steroids). ? A procedure to drain the fluid in your eardrum (myringotomy). In this procedure, a small tube is placed in the eardrum to:  Drain the fluid.  Restore the air in the middle ear space. ? A procedure to insert a balloon device through the nose to inflate the opening of the eustachian tube (balloon dilation). Follow these instructions at home: Lifestyle  Do not do any of the following until your health care provider approves: ? Travel to high altitudes. ? Fly in airplanes. ? Work in a Pension scheme manager or room. ? Scuba dive.  Do not use any products that  contain nicotine or tobacco, such as cigarettes and e-cigarettes. If you need help quitting, ask your health care provider.  Keep your ears dry. Wear fitted earplugs during showering and bathing. Dry your ears completely after. General instructions  Take over-the-counter and prescription medicines only as told by your health care provider.  Use techniques to help pop your ears as recommended by your health care provider. These  may include: ? Chewing gum. ? Yawning. ? Frequent, forceful swallowing. ? Closing your mouth, holding your nose closed, and gently blowing as if you are trying to blow air out of your nose.  Keep all follow-up visits as told by your health care provider. This is important. Contact a health care provider if:  Your symptoms do not go away after treatment.  Your symptoms come back after treatment.  You are unable to pop your ears.  You have: ? A fever. ? Pain in your ear. ? Pain in your head or neck. ? Fluid draining from your ear.  Your hearing suddenly changes.  You become very dizzy.  You lose your balance. Summary  Eustachian tube dysfunction refers to a condition in which a blockage develops in the eustachian tube.  It can be caused by ear infections, allergies, inhaled irritants, or abnormal growths in the nose or throat.  Symptoms include ear pain, hearing loss, or ringing in the ears.  Mild cases are treated with maneuvers to unblock the ears, such as yawning or ear popping.  Severe cases are treated with medicines. Surgery may also be done (rare). This information is not intended to replace advice given to you by your health care provider. Make sure you discuss any questions you have with your health care provider. Document Released: 06/09/2015 Document Revised: 09/02/2017 Document Reviewed: 09/02/2017 Elsevier Interactive Patient Education  2019 [employer].

## 2018-06-02 ENCOUNTER — Encounter: Payer: Self-pay | Admitting: Family Medicine

## 2018-06-05 ENCOUNTER — Telehealth: Payer: Self-pay

## 2018-06-05 NOTE — Telephone Encounter (Signed)
PA approved.  Request Reference Number: [id number]. ESTRADIOL DIS 0.025MG  is approved through 05/27/2019. For further questions, call [phone]

## 2018-06-05 NOTE — Telephone Encounter (Signed)
PA initiated via Covermymeds; KEY: [id number]. Awaiting determination.

## 2018-06-24 ENCOUNTER — Other Ambulatory Visit (INDEPENDENT_AMBULATORY_CARE_PROVIDER_SITE_OTHER): Payer: Medicare Other

## 2018-06-24 DIAGNOSIS — R945 Abnormal results of liver function studies: Secondary | ICD-10-CM

## 2018-06-24 DIAGNOSIS — R7989 Other specified abnormal findings of blood chemistry: Secondary | ICD-10-CM

## 2018-06-24 LAB — HEPATIC FUNCTION PANEL
ALT: 16 U/L (ref 0–35)
AST: 17 U/L (ref 0–37)
Albumin: 3.9 g/dL (ref 3.5–5.2)
Alkaline Phosphatase: 56 U/L (ref 39–117)
Bilirubin, Direct: 0.1 mg/dL (ref 0.0–0.3)
TOTAL PROTEIN: 6.1 g/dL (ref 6.0–8.3)
Total Bilirubin: 0.4 mg/dL (ref 0.2–1.2)

## 2018-07-07 ENCOUNTER — Other Ambulatory Visit: Payer: Self-pay | Admitting: Family Medicine

## 2018-07-07 ENCOUNTER — Other Ambulatory Visit: Payer: Self-pay | Admitting: Internal Medicine

## 2018-07-22 ENCOUNTER — Ambulatory Visit (INDEPENDENT_AMBULATORY_CARE_PROVIDER_SITE_OTHER): Payer: Medicare Other | Admitting: Internal Medicine

## 2018-07-22 ENCOUNTER — Telehealth: Payer: Self-pay | Admitting: Internal Medicine

## 2018-07-22 ENCOUNTER — Encounter: Payer: Self-pay | Admitting: Internal Medicine

## 2018-07-22 ENCOUNTER — Encounter (INDEPENDENT_AMBULATORY_CARE_PROVIDER_SITE_OTHER): Payer: Self-pay

## 2018-07-22 VITALS — BP 120/70 | HR 82 | Ht 65.5 in | Wt 123.0 lb

## 2018-07-22 DIAGNOSIS — K219 Gastro-esophageal reflux disease without esophagitis: Secondary | ICD-10-CM

## 2018-07-22 DIAGNOSIS — K580 Irritable bowel syndrome with diarrhea: Secondary | ICD-10-CM

## 2018-07-22 DIAGNOSIS — Z86010 Personal history of colonic polyps: Secondary | ICD-10-CM

## 2018-07-22 DIAGNOSIS — K8689 Other specified diseases of pancreas: Secondary | ICD-10-CM | POA: Diagnosis not present

## 2018-07-22 DIAGNOSIS — K861 Other chronic pancreatitis: Secondary | ICD-10-CM

## 2018-07-22 MED ORDER — DIPHENOXYLATE-ATROPINE 2.5-0.025 MG PO TABS: 1.0000 | ORAL_TABLET | Freq: Four times a day (QID) | ORAL | 2 refills | Status: DC | PRN

## 2018-07-22 MED ORDER — RIFAXIMIN 550 MG PO TABS: 550.0000 mg | ORAL_TABLET | Freq: Three times a day (TID) | ORAL | 0 refills | Status: AC

## 2018-07-22 NOTE — Progress Notes (Signed)
Subjective:    Patient ID: [name], female    DOB: [birth year], 78 y.o.   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  She was last seen for colonoscopy in September 2019 in the office in March 2019.  Colonoscopy to the cecum with a good prep performed on 02/09/2018.  4 polyps in the ascending colon ranging 4 to 8 mm in size were removed.  Scattered diverticulosis in the left colon.  Post banding scars in the rectum with a small internal hemorrhoid in the right anterior position.  Polyps are adenomatous without high-grade dysplasia  She has been doing well.  She has been maintained on Creon 6 capsules 3 times daily.  She has been struggling with more diarrhea and loose stool worse after her first meal each day.  Some right and left-sided abdominal crampy discomfort.  No blood in her stool or melena.  Is using Lomotil 1 or 2 tablets on an as-needed basis.  Reflux is been well controlled with pantoprazole daily.  She has been dealing recently with kidney stones.  She sees Dr. Jeffie Pollock   Review of Systems As per HPI, otherwise negative  Current Medications, Allergies, Past Medical History, Past Surgical History, Family History and Social History were reviewed in [place] record.     Objective:   Physical Exam BP 120/70   Pulse 82   Ht 5' 5.5" (1.664 m)   Wt 123 lb (55.8 kg)   BMI 20.16 kg/m  Constitutional: Well-developed and well-nourished. No distress. HEENT: Normocephalic and atraumatic.  Conjunctivae are normal.  No scleral icterus. Neck: Neck supple. Trachea midline. Cardiovascular: Normal rate, regular rhythm and intact distal pulses. No M/R/G Pulmonary/chest: Effort normal and breath sounds normal. No wheezing, rales or  rhonchi. Abdominal: Soft, nontender, nondistended. Bowel sounds active throughout. There are no masses palpable. No hepatosplenomegaly. Extremities: no clubbing, cyanosis, or edema Neurological: Alert and oriented to person place and time. Skin: Skin is warm and dry. Psychiatric: Normal mood and affect. Behavior is normal.  CBC    Component Value Date/Time   WBC 13.2 (H) 03/20/2018 1403   RBC 4.51 03/20/2018 1403   HGB 13.6 03/20/2018 1403   HGB 13.1 02/13/2017 1136   HCT 41.6 03/20/2018 1403   HCT 38.1 02/13/2017 1136   PLT 282 03/20/2018 1403   PLT 255 02/13/2017 1136   MCV 92.2 03/20/2018 1403   MCV 89 02/13/2017 1136   MCH 30.2 03/20/2018 1403   MCHC 32.7 03/20/2018 1403   RDW 12.8 03/20/2018 1403   RDW 13.4 02/13/2017 1136   LYMPHSABS 0.7 03/20/2018 1403   MONOABS 0.3 03/20/2018 1403   EOSABS 0.0 03/20/2018 1403   BASOSABS 0.0 03/20/2018 1403   CMP     Component Value Date/Time   NA 139 03/20/2018 1403   NA 143 02/13/2017 1136   K 3.4 (L) 03/20/2018 1403   CL 103 03/20/2018 1403   CO2 25 03/20/2018 1403   GLUCOSE 157 (H) 03/20/2018 1403   BUN 15 03/20/2018 1403   BUN 14 02/13/2017 1136   CREATININE 1.03 (H) 03/20/2018 1403   CREATININE 0.92 03/26/2013 0931   CALCIUM 8.8 (L) 03/20/2018 1403   PROT 6.1 06/24/2018 1054   ALBUMIN 3.9 06/24/2018 1054   AST 17 06/24/2018 1054   ALT 16 06/24/2018 1054  ALKPHOS 56 06/24/2018 1054   BILITOT 0.4 06/24/2018 1054   GFRNONAA 51 (L) 03/20/2018 1403   GFRAA 59 (L) 03/20/2018 1403   CT ABDOMEN AND PELVIS WITHOUT CONTRAST   TECHNIQUE: Multidetector CT imaging of the abdomen and pelvis was performed following the standard protocol without IV contrast. Sagittal and coronal MPR images reconstructed from axial data set. Oral contrast was not administered   COMPARISON:  04/18/2014, 12/14/2008   FINDINGS: Lower chest: Bibasilar atelectasis   Hepatobiliary: Cyst RIGHT lobe liver 13 x 11 mm image 32 unchanged. Liver  otherwise unremarkable. Gallbladder surgically absent.   Pancreas: Normal appearance   Spleen: Normal appearance   Adrenals/Urinary Tract: Adrenal glands normal appearance. Mildly hyperdense nodules within RIGHT kidney again identified likely hyperdense cysts, largest posterior mid kidney 9 mm diameter unchanged. New RIGHT hydronephrosis and hydroureter secondary to a 5 mm distal RIGHT ureteral calculus image 63. No renal calculi, LEFT hydronephrosis or LEFT hydroureter. Bladder decompressed.   Stomach/Bowel: Minimal distal colonic diverticulosis without evidence of diverticulitis. Normal appendix. Stomach and bowel loops otherwise normal appearance.   Vascular/Lymphatic: Numerous pelvic phleboliths. Aorta normal caliber. Minimal atherosclerotic calcification aorta. No adenopathy.   Reproductive: Uterus surgically absent. Nonvisualization of ovaries.   Other: No free air or free fluid. Tiny umbilical hernia containing fat.   Musculoskeletal: Bones demineralized   IMPRESSION: RIGHT hydronephrosis and hydroureter secondary to a 5 mm distal RIGHT ureteral calculus.   Minimal distal colonic diverticulosis without evidence of diverticulitis.   No other significant intra-abdominal or intrapelvic abnormalities.     Electronically Signed   By: Lavonia Dana M.D.   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   1.  Chronic autoimmune pancreatitis, type II --stable.  Continue Creon 6 tablets with meals 3 times daily.  Has not required steroid therapy in some time.  No evidence for malnutrition.  Pancreas normal by noncontrasted imaging recently.    2.  IBS with diarrhea --more predominant recently.  Will try rifaximin 550 mg 3 times daily x14 days.  Continue  Lomotil 1 or 2 tablets 4 times daily as needed  3.  GERD/dyspepsia --doing well without alarm symptom.  Continue pantoprazole 40 mg daily  4.  Adenomatous colon polyp --4 adenomas last year, surveillance interval 3 years.  Will discuss based on age at that time.  25 minutes spent with the patient today. Greater than 50% was spent in counseling and coordination of care with the patient

## 2018-07-22 NOTE — Patient Instructions (Signed)
We have sent the following medications to your pharmacy for you to pick up at your convenience: Xifaxan 550 mg three times daily x 14 days Lomotil 1-2 tablets four times daily as needed  Continue Creon 6 capsules three times daily  Please follow up with Dr Hilarie Fredrickson in 6 monhs.  If you are age 79 or older, your body mass index should be between 23-30. Your Body mass index is 20.16 kg/m. If this is out of the aforementioned range listed, please consider follow up with your Primary Care Provider.  If you are age 4 or younger, your body mass index should be between 19-25. Your Body mass index is 20.16 kg/m. If this is out of the aformentioned range listed, please consider follow up with your Primary Care Provider.

## 2018-07-22 NOTE — Telephone Encounter (Signed)
Pt called advised that the med rifaximin Doreene Nest) is to expensive. Would like to know if there is anything else.

## 2018-07-23 ENCOUNTER — Telehealth: Payer: Self-pay | Admitting: *Deleted

## 2018-07-23 NOTE — Telephone Encounter (Signed)
Patient has been approved for lomotil through 05/27/19 from [facility]. [place] [id number].

## 2018-07-23 NOTE — Telephone Encounter (Signed)
Patient has been advised we placed samples of xifaxan at front desk for her to pick up.

## 2018-07-29 ENCOUNTER — Ambulatory Visit (INDEPENDENT_AMBULATORY_CARE_PROVIDER_SITE_OTHER): Payer: Medicare Other | Admitting: Family Medicine

## 2018-07-29 ENCOUNTER — Encounter: Payer: Self-pay | Admitting: Family Medicine

## 2018-07-29 VITALS — BP 122/72 | HR 76 | Temp 97.4°F | Ht 65.5 in | Wt 125.1 lb

## 2018-07-29 DIAGNOSIS — R109 Unspecified abdominal pain: Secondary | ICD-10-CM | POA: Diagnosis not present

## 2018-07-29 DIAGNOSIS — R21 Rash and other nonspecific skin eruption: Secondary | ICD-10-CM

## 2018-07-29 MED ORDER — TIZANIDINE HCL 4 MG PO TABS: 2.0000 mg | ORAL_TABLET | Freq: Four times a day (QID) | ORAL | 0 refills | Status: DC | PRN

## 2018-07-29 MED ORDER — VALACYCLOVIR HCL 500 MG PO TABS: 1000.0000 mg | ORAL_TABLET | Freq: Three times a day (TID) | ORAL | 0 refills | Status: DC

## 2018-07-29 NOTE — Patient Instructions (Signed)
Heat (pad or rice pillow in microwave) over affected area, 10-15 minutes twice daily.   Ice/cold pack over area for 10-15 min twice daily.  Stretch your sides daily. Hold for 30 seconds and repeat twice. You can do this while you are watching TV or relaxing.   You may have shingles. We are going to treat it as if it were given the appearance.  Let us know if you need anything.

## 2018-07-29 NOTE — Progress Notes (Signed)
Musculoskeletal Exam  Patient: [name] DOB: 14-Dec-1940  DOS: 07/29/2018  SUBJECTIVE:  Chief Complaint:   Chief Complaint  Patient presents with  . Flank Pain    right    [name] is a 78 y.o.  female for evaluation and treatment of R side pain.   Onset:  1 week ago. Did elliptical machine Location: R side Character:  aching  Progression of issue:  is unchanged Associated symptoms: might have felt a knot earlier on; also having itching for past day over area, thinks it is from the topical therapy Treatment: to date has been topicals.   Neurovascular symptoms: no   ROS: Musculoskeletal/Extremities: +side pain Skin: +itching  Past Medical History:  Diagnosis Date  . Allergic state   . Allergy   . ANXIETY, CHRONIC   . 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 CHEST PAIN    LHC 02/2011: normal cors, EF 65%  . Contact dermatitis 10/13/2017  . Diverticulosis   . Esophageal reflux   . Fibrocystic breast   . Hand injury, left, initial encounter 10/13/2017  . Hepatic cyst   . HYPOTENSION, ORTHOSTATIC   . IBS (irritable bowel syndrome)   . Internal hemorrhoids without mention of complication   . Kidney lesion   . Kidney stone   . Liver cyst   . Osteoarthritis   . Osteopenia   . Osteopenia   . Osteoporosis   . Pain in ear   . PALPITATIONS, RECURRENT   . Pancreatitis chronic   . Preventative health care   . PSVT   . 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 SHOULDER PAIN   . TOBACCO USE, QUIT   . VITAMIN D DEFICIENCY     Objective: VITAL SIGNS: BP 122/72 (BP Location: Right Arm, Patient Position: Sitting, Cuff Size: Normal)   Pulse 76   Temp (!) 97.4 F (36.3 C) (Oral)   Ht 5' 5.5" (1.664 m)   Wt 125 lb 2 oz (56.8 kg)   SpO2 96%   BMI 20.51 kg/m  Constitutional: Well formed, well developed. No acute distress. Cardiovascular: Brisk cap refill Thorax & Lungs: No accessory muscle use Musculoskeletal: Side.   Normal active range of motion in  UE's.  Tenderness to palpation: mild ttp over serratus anterior Deformity: no Ecchymosis: no Neurologic: Normal sensory function. No focal deficits noted. DTR's equal and symmetry in LE's. No clonus. Skin: See below Psychiatric: Normal mood. Age appropriate judgment and insight. Alert & oriented x 3.     R side   Back  Assessment:  Side pain - Plan: tiZANidine (ZANAFLEX) 4 MG tablet  Rash - Plan: valACYclovir (VALTREX) 500 MG tablet  Plan: #1-Stretch, ice, heat, tizanidine 2-4 mg as needed. #2-this looks like shingles to me based off the distribution.  The distribution does not fit her story for the topical treatment.  We will do 1 g 3 times daily of valacyclovir for 1 week.  If no improvement, will consider biopsy. F/u prn. The patient voiced understanding and agreement to the plan.   [place], DO 07/29/18  3:17 PM

## 2018-07-31 ENCOUNTER — Ambulatory Visit: Payer: Self-pay

## 2018-07-31 NOTE — Telephone Encounter (Signed)
Please advise 

## 2018-07-31 NOTE — Telephone Encounter (Signed)
Capsaicin cream for shingles. TY.

## 2018-07-31 NOTE — Telephone Encounter (Signed)
Pt. called to ask for recommendation for a "topical" analgesic for shingles rash.  Stated she is not able to take OTC Tylenol or Ibuprofen due to condition of Auto Immune Pancreatitis.  Reported the shingles rash continues to appear red and raised, and spread from mid abdomen to mid back; denied any blistering at this time.  Denied fever.  Taking Valtrex as ordered.  Has tried both heat and cold application; feels the cold application is more effective.  Advised will send Triage note to provider for further recommendations.  Care advice given per protocol.  Verb. understanding.          Reason for Disposition . [1] Shingles rash already diagnosed and [2] taking antiviral medication  Answer Assessment - Initial Assessment Questions 1. APPEARANCE of RASH: "Describe the rash."      Raised red rash scattered from right mid abd to mid back vary in size; no blisters 2. LOCATION: "Where is the rash located?"      Right flank; starts mid abdomen and goes around to mid back 3. ONSET: "When did the rash start?"      About 9 days; diagnosed with shingles on 07/29/18 4. ITCHING: "Does the rash itch?" If so, ask: "How bad is the itch?"  (Scale 1-10; or mild, moderate, severe)     Mild itching in back region 5. PAIN: "Does the rash hurt?" If so, ask: "How bad is the pain?"  (Scale 1-10; or mild, moderate, severe)     Intermittent sharp pain, and constant hurt ; can be severe  6. OTHER SYMPTOMS: "Do you have any other symptoms?" (e.g., fever)     Denied fever; denied other symptoms other than "just don't feel good"  7. PREGNANCY: "Is there any chance you are pregnant?" "When was your last menstrual period?"     N/a  Protocols used: [facility]  Message from Keene Breath sent at 07/31/2018 9:00 AM EST   Patient called to ask what she could do for her shingles that was diagnosed by Dr. Nani Ravens earlier in the week. She stated that she has a rash around her waste. No blisters, it just hurts and she  can't sleep at night. She did not want an appointment, but wanted to know what she could do to help her sleep. Please advise and call back at [phone]

## 2018-08-04 NOTE — Telephone Encounter (Signed)
Called left message for patient to call the office back 

## 2018-08-13 ENCOUNTER — Telehealth: Payer: Self-pay | Admitting: Family Medicine

## 2018-08-13 ENCOUNTER — Ambulatory Visit: Payer: Self-pay

## 2018-08-13 MED ORDER — GABAPENTIN 100 MG PO CAPS: 100.0000 mg | ORAL_CAPSULE | Freq: Three times a day (TID) | ORAL | 1 refills | Status: DC | PRN

## 2018-08-13 MED ORDER — ACETAMINOPHEN 500 MG PO TABS: 500.0000 mg | ORAL_TABLET | Freq: Three times a day (TID) | ORAL | 0 refills | Status: DC | PRN

## 2018-08-13 NOTE — Telephone Encounter (Signed)
Copied from [place] [id number]. Topic: Quick Communication - See Telephone Encounter >> Aug 13, 2018  3:25 PM Vernona Rieger wrote: CRM for notification. See Telephone encounter for: 08/13/18.  Patient is calling back regarding her triage this morning. Please advise.

## 2018-08-13 NOTE — Telephone Encounter (Signed)
Pt c/o continued pain and burning and sharp pain moderate to severe in intensity to mid stomach and to the side and back. The rash is still on the abdomen but is gone from the back. Pain is persisting. Saw in chart review that capsicin was recommended but is on the on active med list. Dr. Nani Ravens ordered Capsaicin cream on 07/31/18. Please call into [facility] on [address]. Pt has been using calamine lotion and taking Aleve with no relief. Routing to office.  Reason for Disposition . Postherpetic neuralgia, questions about  Answer Assessment - Initial Assessment Questions 1. APPEARANCE of RASH: "Describe the rash."      Rash raised not as raised as it was 2. LOCATION: "Where is the rash located?"      Mid abdomen 3. ONSET: "When did the rash start?"       4. ITCHING: "Does the rash itch?" If so, ask: "How bad is the itch?"  (Scale 1-10; or mild, moderate, severe)    Mild using calamine and had used Benadryl  5. PAIN: "Does the rash hurt?" If so, ask: "How bad is the pain?"  (Scale 1-10; or mild, moderate, severe)     Aleve-moderate to severe-sharp, burning, itching, constant hurt to back and side-burning itching pain 6. OTHER SYMPTOMS: "Do you have any other symptoms?" (e.g., fever)     fatigue 7. PREGNANCY: "Is there any chance you are pregnant?" "When was your last menstrual period?"     n/a  Protocols used: [facility]

## 2018-08-13 NOTE — Telephone Encounter (Signed)
Try Gabapentin 100 mg tabs, start with 1 tab po qhs and then can increase to 300 mg po qhs. Also add Tylenol ES 500 mg po tid x 7 days then as needed after that

## 2018-08-13 NOTE — Telephone Encounter (Signed)
Patient was calling back- she has not received a follow up call or medication to help with discomfort- can someone call her with Dr Frederik Pear response.

## 2018-08-13 NOTE — Telephone Encounter (Signed)
Patient notified and medications sent in

## 2018-08-13 NOTE — Telephone Encounter (Signed)
Please advise 

## 2018-08-19 ENCOUNTER — Telehealth: Payer: Self-pay | Admitting: Internal Medicine

## 2018-08-19 NOTE — Telephone Encounter (Signed)
The office from Pre Auth. Called would like to confirm if we received the preAuth from them for [facility]) Call Back [phone]

## 2018-08-20 NOTE — Telephone Encounter (Signed)
[employer] has approved patient's Xifaxan through 09/03/2018. Reference-PA [id number].

## 2018-08-20 NOTE — Telephone Encounter (Signed)
Prior auth completed through [email or web address]

## 2018-08-26 ENCOUNTER — Ambulatory Visit: Payer: Self-pay

## 2018-08-26 NOTE — Telephone Encounter (Signed)
Patient called and says she was standing in her closet and passed out, not very long, woke up, walked down stairs and laid on the couch. She says she thinks it's because she took an extra gabapentin this morning, because the pharmacist told her when she picked it up on 3/19, that if she's still in pain, it's ok to take 1 in the morning and the 1 prescribed at night for sleep. She says she's still in pain and feeling tired from the shingles. She says so she tried it today and while she was standing in the closet, which wasn't a long period of standing, she felt nauseated, lightheaded and was going to sit down, then she was waking up in the closet with the rod broken. She says her husband checked her over and there was no injury. She says she started not to call, but figured she should to find out if it's ok to take the extra gabapentin for the pain. I advised to just take the night dose until she hears back from Dr. Charlett Blake, just in case that extra dose caused the fainting, care advice given, advised someone from the office will call back, patient verbalized understanding.  Reason for Disposition . [1] Sudden standing caused simple fainting AND [2] now alert and feels fine  Answer Assessment - Initial Assessment Questions 1. ONSET: "How long were you unconscious?" (minutes) "When did it happen?"     No idea 2. CONTENT: "What happened during period of unconsciousness?" (e.g., seizure activity)      No idea 3. MENTAL STATUS: "Alert and oriented now?" (oriented x 3 = name, month, location)      Yes 4. TRIGGER: "What do you think caused the fainting?" "What were you doing just before you fainted?"  (e.g., exercise, sudden standing up, prolonged standing)     Gabapentin; standing in the closet, got nauseated, lightheaded, then woke up in the closet 5. RECURRENT SYMPTOM: "Have you ever passed out before?" If so, ask: "When was the last time?" and "What happened that time?"      Yes a couple of years ago 6.  INJURY: "Did you sustain any injury during the fall?"      No 7. CARDIAC SYMPTOMS: "Have you had any of the following symptoms: chest pain, difficulty breathing, palpitations?"     No 8. NEUROLOGIC SYMPTOMS: "Have you had any of the following symptoms: headache, numbness, vertigo, weakness?"     Headache (allergies), lightheaded and weak lasted a second before I passed out 9. GI SYMPTOMS: "Have you had any of the following symptoms: abdominal pain, vomiting, diarrhea, blood in stools?"     No 10. OTHER SYMPTOMS: "Do you have any other symptoms?"      No 11. PREGNANCY: "Is there any chance you are pregnant?" "When was your last menstrual period?"      No  Protocols used: [facility]

## 2018-08-27 NOTE — Telephone Encounter (Signed)
She has several options. She could try the morning dose one more time but make sure she is with someone and has the ability to sit quiet. If she wants the other option would be to try taking 2 at bedtime. She also can do a video visit to discuss her options

## 2018-08-27 NOTE — Telephone Encounter (Signed)
Patient called and she says she doesn't trust the medicine now and she's wanting to know if there is something else to try. She says she's been taking it easy today. She says she thinks she bruised a rib when she passed out yesterday. I asked if she has capability to do a webex visit, she says she doesn't have a computer and not comfortable on her phone, so is requesting a phone visit to discuss.

## 2018-08-27 NOTE — Telephone Encounter (Signed)
Called left message for patient to call the office back   Nurse triage may handle  

## 2018-08-28 NOTE — Telephone Encounter (Signed)
Spoke with she does not want to do a virtual visit she does not have a computer and does not know how to access her phone that well. Patient would like a phone call. Let me know when you can call  Please advise

## 2018-08-29 NOTE — Telephone Encounter (Signed)
Let's call her and do a phone visit on 4/6

## 2018-09-01 ENCOUNTER — Ambulatory Visit (INDEPENDENT_AMBULATORY_CARE_PROVIDER_SITE_OTHER): Payer: Medicare Other | Admitting: Family Medicine

## 2018-09-01 ENCOUNTER — Other Ambulatory Visit: Payer: Self-pay

## 2018-09-01 DIAGNOSIS — K861 Other chronic pancreatitis: Secondary | ICD-10-CM

## 2018-09-01 DIAGNOSIS — B0229 Other postherpetic nervous system involvement: Secondary | ICD-10-CM | POA: Diagnosis not present

## 2018-09-01 DIAGNOSIS — M8580 Other specified disorders of bone density and structure, unspecified site: Secondary | ICD-10-CM

## 2018-09-01 NOTE — Assessment & Plan Note (Signed)
Is stable but is taking social distancing seriously due to her immune compromised status.

## 2018-09-01 NOTE — Assessment & Plan Note (Signed)
She fell in her home during a syncopal episode and has lateral rib pain worse with movement and palpation. No sob, likely has broken a rib but is able to stay still and get comfortable. We will not xray due to Covid unless she gets worse. If she develops SOB she should seek care.

## 2018-09-01 NOTE — Telephone Encounter (Signed)
Scheduled virtual visit for 09/01/18

## 2018-09-01 NOTE — Assessment & Plan Note (Signed)
Has continued to struggle with pain but has not tolerated the gabapentin. She had a syncopal episode when she tried to increase her Gabapentin 100 mg bid. She has dropped down to 1 a day and this is not helping the pain so she has decided to stop the medication and try to manage the pain with Tylenol and topical creams.

## 2018-09-01 NOTE — Progress Notes (Signed)
Virtual Visit via Telephone Note  I connected with [name] on 09/01/18 at  2:40 PM EDT by telephone and verified that I am speaking with the correct person using two identifiers.   I discussed the limitations, risks, security and privacy concerns of performing an evaluation and management service by telephone and the availability of in person appointments. I also discussed with the patient that there may be a patient responsible charge related to this service. The patient expressed understanding and agreed to proceed.   History of Present Illness: patient is interviewed via telephone to evaluate a recent syncopal episode and fall that resulted in a bruise over her right eye and some rib pain on her right side. Has continued to struggle with pain but has not tolerated the gabapentin. She had a syncopal episode when she tried to increase her Gabapentin 100 mg bid. She has dropped down to 1 a day and this is not helping the pain so she has decided to stop the medication and try to manage the pain with Tylenol and topical creams. Notes increased pain with moved and palpation. Deniespalp/SOB/HA/congestion/fevers/GI or GU c/o. Taking meds as prescribed    Observations/Objective:   Assessment and Plan:   Follow Up Instructions: prn     I discussed the assessment and treatment plan with the patient. The patient was provided an opportunity to ask questions and all were answered. The patient agreed with the plan and demonstrated an understanding of the instructions.   The patient was advised to call back or seek an in-person evaluation if the symptoms worsen or if the condition fails to improve as anticipated.  I provided 15 minutes of non-face-to-face time during this encounter.   Penni Homans, MD

## 2018-09-08 ENCOUNTER — Other Ambulatory Visit: Payer: Self-pay | Admitting: Internal Medicine

## 2018-09-25 ENCOUNTER — Telehealth: Payer: Self-pay

## 2018-09-25 NOTE — Telephone Encounter (Signed)

## 2018-10-01 ENCOUNTER — Other Ambulatory Visit: Payer: Self-pay

## 2018-10-01 ENCOUNTER — Encounter: Payer: Self-pay | Admitting: Internal Medicine

## 2018-10-01 ENCOUNTER — Telehealth (INDEPENDENT_AMBULATORY_CARE_PROVIDER_SITE_OTHER): Payer: Medicare Other | Admitting: Internal Medicine

## 2018-10-01 VITALS — BP 145/75 | HR 75 | Ht 65.5 in | Wt 123.0 lb

## 2018-10-01 DIAGNOSIS — R55 Syncope and collapse: Secondary | ICD-10-CM | POA: Diagnosis not present

## 2018-10-01 DIAGNOSIS — I951 Orthostatic hypotension: Secondary | ICD-10-CM

## 2018-10-01 NOTE — Progress Notes (Signed)
Electrophysiology TeleHealth Note   Due to national recommendations of social distancing due to COVID 19, an audio/video telehealth visit is felt to be most appropriate for this patient at this time.  See MyChart message from today for the patient's consent to telehealth for [facility].   Date:  10/01/2018   ID:  [name], DOB [birth year], MRN [id number]  Location: patient's home  Provider location: [address], [place]  Evaluation Performed: Follow-up visit  PCP:  Mosie Lukes, MD  Cardiologist:     Electrophysiologist:  SK   Chief Complaint:  syncope  History of Present Illness:    [name] is a 78 y.o. female who presents via audio/video conferencing for a telehealth visit today.  Since last being seen in our clinic for orthostatic hypotension*  the patient reports interval fall/syncope related temporally to gabapentin   The gabapentin has been started for zoster.  A quick literature review demonstrated no association of gabapentin as a cause of syncope; interestingly, there was a series of papers highlighting its utility in cough syncope.  She continues with orthostatic lightheadedness, no worse than usual  No chest pain or shortness of breath, palpitations edema or nocturnal dyspnea  The pat  ent denies symptoms of fevers, chills, cough, or new SOB worrisome for COVID 19.  *  Past Medical History:  Diagnosis Date  . Allergic state   . Allergy   . ANXIETY, CHRONIC   . 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 CHEST PAIN    LHC 02/2011: normal cors, EF 65%  . Contact dermatitis 10/13/2017  . Diverticulosis   . Esophageal reflux   . Fibrocystic breast   . Hand injury, left, initial encounter 10/13/2017  . Hepatic cyst   . HYPOTENSION, ORTHOSTATIC   . IBS (irritable bowel syndrome)   . Internal hemorrhoids without mention of complication   . Kidney lesion   . Kidney stone   . Liver cyst   . Osteoarthritis   . Osteopenia   .  Osteopenia   . Osteoporosis   . Pain in ear   . PALPITATIONS, RECURRENT   . Pancreatitis chronic   . Preventative health care   . PSVT   . 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 SHOULDER PAIN   . TOBACCO USE, QUIT   . VITAMIN D DEFICIENCY     Past Surgical History:  Procedure Laterality Date  . ABDOMINAL HYSTERECTOMY  1998  . BREAST SURGERY     breast bx on left, benign  . CARDIAC CATHETERIZATION    . CATARACT EXTRACTION, BILATERAL    . CATARACT EXTRACTION, BILATERAL    . CHOLECYSTECTOMY  1999  . COLONOSCOPY    . EYE SURGERY Bilateral 04/2017, 05/2017   cataracts  . [place]  . TUBAL LIGATION     with D&C    Current Outpatient Medications  Medication Sig Dispense Refill  . Ascorbic Acid (VITAMIN C) 500 MG tablet Take 500 mg by mouth daily.      Marland Kitchen b complex vitamins tablet Take 1 tablet by mouth daily.    . cetirizine (ZYRTEC) 10 MG tablet Take 10 mg by mouth daily.      . Cholecalciferol (VITAMIN D3) 1000 UNITS CAPS Take 1 tablet by mouth daily.     . Cranberry 500 MG CAPS Take 1 capsule by mouth daily.    . diphenoxylate-atropine (LOMOTIL) 2.5-0.025 MG tablet Take 1 tablet by mouth 4 (four)  times daily as needed for diarrhea or loose stools. 360 tablet 2  . escitalopram (LEXAPRO) 10 MG tablet TAKE 1 TABLET BY MOUTH  DAILY 90 tablet 3  . estradiol (VIVELLE-DOT) 0.025 MG/24HR Apply 1 patch topically to skin twice weekly. 24 patch 11  . fluticasone (FLONASE) 50 MCG/ACT nasal spray Place 2 sprays into both nostrils as needed for rhinitis or allergies. 16 g 0  . metroNIDAZOLE (METROCREAM) 0.75 % cream Apply 1 application topically 2 (two) times daily.     . Pancrelipase, Lip-Prot-Amyl, (CREON) 24000-76000 units CPEP TAKE 6 CAPSULES BY MOUTH  WITH EACH MEAL AND 1 TO 2  CAPSULES WITH EACH SNACK. 2100 capsule 1  . pantoprazole (PROTONIX) 40 MG tablet TAKE 1 TABLET BY MOUTH  DAILY 90 tablet 3  . Probiotic Product (PROBIOTIC PO) Take by mouth. Takes Probaclac probiotic  once daily     . TURMERIC PO Take by mouth daily.     No current facility-administered medications for this visit.     Allergies:   Amoxicillin; Azithromycin; Ciprofloxacin; Epinephrine; Penicillins; Sulfonamide derivatives; and Tamiflu [oseltamivir phosphate]   Social History:  The patient  reports that she quit smoking about 45 years ago. Her smoking use included cigarettes. She has never used smokeless tobacco. She reports that she does not drink alcohol or use drugs.   Family History:  The patient's   family history includes Alcohol abuse in her mother; Bladder Cancer in her father; Breast cancer in her cousin and paternal aunt; Crohn's disease in her son; Dementia in her father; Diabetes in her father; Heart disease in her father, maternal grandfather, maternal grandmother, and sister; Hypertension in her mother; Hypothyroidism in her father; Lung cancer in her paternal uncle; Pancreatic cancer in her paternal aunt; Stomach cancer in her paternal uncle; Stroke in her maternal grandfather, maternal grandmother, and mother; Uterine cancer in her paternal grandmother.   ROS:  Please see the history of present illness.   All other systems are personally reviewed and negative.    Exam:    Vital Signs:  BP (!) 145/75   Pulse 75   Ht 5' 5.5" (1.664 m)   Wt 123 lb (55.8 kg)   BMI 20.16 kg/m    Well appearing, alert and conversant, regular work of breathing,  good skin color Eyes- anicteric, neuro- grossly intact, skin- no apparent rash or lesions or cyanosis, mouth- oral mucosa is pink   Labs/Other Tests and Data Reviewed:    Recent Labs: 10/13/2017: TSH 2.21 03/20/2018: BUN 15; Creatinine, Ser 1.03; Hemoglobin 13.6; Platelets 282; Potassium 3.4; Sodium 139 06/24/2018: ALT 16  Personally reviewed     Wt Readings from Last 3 Encounters:  10/01/18 123 lb (55.8 kg)  07/29/18 125 lb 2 oz (56.8 kg)  07/22/18 123 lb (55.8 kg)     Other studies personally reviewed: Additional studies/  records that were reviewed today include:outside records  Review of the above records today demonstrates: As above  Prior radiographs:      ASSESSMENT & PLAN:    Hypertension - white coat.  Atrial Tach-- no significant recurrences  Orthostatic intolerance   No significant interval palpitations.  Reviewed again the physiology of orthostatic intolerance; suggested the use of compressive wear.      COVID 19 screen The patient denies symptoms of COVID 19 at this time.  The importance of social distancing was discussed today.  Follow-up:   62m     Current medicines are reviewed at length with the patient today.  The patient does not have concerns regarding her medicines.  The following changes were made today:  none  Labs/ tests ordered today include:   No orders of the defined types were placed in this encounter.   Future tests ( post COVID )    months  Patient Risk:  after full review of this patients clinical status, I feel that they are at moderate  risk at this time.  Today, I have spent *15* minutes with the patient with telehealth technology discussing the above.  Signed, Virl Axe, MD  10/01/2018 2:28 PM     [place] [address] [place] [address] [phone] (office) [phone] (fax)

## 2018-10-01 NOTE — Patient Instructions (Signed)

## 2018-10-14 NOTE — Progress Notes (Addendum)
Virtual Visit via Video Note  I connected with patient on 10/15/18 at  9:00 AM EDT by a video enabled telemedicine application and verified that I am speaking with the correct person using two identifiers.   THIS ENCOUNTER IS A VIRTUAL VISIT DUE TO COVID-19 - PATIENT WAS NOT SEEN IN THE OFFICE. PATIENT HAS CONSENTED TO VIRTUAL VISIT / TELEMEDICINE VISIT   Location of patient: home  Location of provider: office  I discussed the limitations of evaluation and management by telemedicine and the availability of in person appointments. The patient expressed understanding and agreed to proceed.   Subjective:   [name] is a 78 y.o. female who presents for Medicare Annual (Subsequent) preventive examination.  Misses seeing family during pandemic. Watches church online and does facetime.   Review of Systems: No ROS.  Medicare Wellness Virtual Visit.  Visual/audio telehealth visit, UTA vital signs.   See social history for additional risk factors. Cardiac Risk Factors include: advanced age (>68men, >63 women) Sleep patterns: no issues Home Safety/Smoke Alarms: Feels safe in home. Smoke alarms in place.  Lives with husband.   Female:        Mammo- 05/25/18      Dexa scan-05/23/17        CCS-02/09/18. Recall 3 yrs     Objective:     Advanced Directives 10/15/2018 03/20/2018 09/12/2016 08/03/2015 08/01/2014  Does Patient Have a Medical Advance Directive? Yes Yes No Yes Yes  Type of Paramedic of [place];Living will [place];Living will - [place];Living will [place];Living will  Does patient want to make changes to medical advance directive? No - Patient declined - - No - Patient declined -  Copy of [place] in Chart? No - copy requested - - No - copy requested No - copy requested  Would patient like information on creating a medical advance directive? - - Yes  (MAU/Ambulatory/Procedural Areas - Information given) - -    Tobacco Social History   Tobacco Use  Smoking Status Former Smoker  . Types: Cigarettes  . Last attempt to quit: 05/27/1973  . Years since quitting: 45.4  Smokeless Tobacco Never Used     Counseling given: Not Answered   Clinical Intake: Pain : No/denies pain  Past Medical History:  Diagnosis Date  . Allergic state   . Allergy   . ANXIETY, CHRONIC   . 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 CHEST PAIN    LHC 02/2011: normal cors, EF 65%  . Contact dermatitis 10/13/2017  . Diverticulosis   . Esophageal reflux   . Fibrocystic breast   . Hand injury, left, initial encounter 10/13/2017  . Hepatic cyst   . HYPOTENSION, ORTHOSTATIC   . IBS (irritable bowel syndrome)   . Internal hemorrhoids without mention of complication   . Kidney lesion   . Kidney stone   . Liver cyst   . Osteoarthritis   . Osteopenia   . Osteopenia   . Osteoporosis   . Pain in ear   . PALPITATIONS, RECURRENT   . Pancreatitis chronic   . Preventative health care   . PSVT   . 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 SHOULDER PAIN   . TOBACCO USE, QUIT   . VITAMIN D DEFICIENCY    Past Surgical History:  Procedure Laterality Date  . ABDOMINAL HYSTERECTOMY  1998  . BREAST SURGERY     breast bx on left, benign  .  CARDIAC CATHETERIZATION    . CATARACT EXTRACTION, BILATERAL    . CATARACT EXTRACTION, BILATERAL    . CHOLECYSTECTOMY  1999  . COLONOSCOPY    . EYE SURGERY Bilateral 04/2017, 05/2017   cataracts  . [place]  . TUBAL LIGATION     with D&C   Family History  Problem Relation Age of Onset  . Hypertension Mother   . Stroke Mother   . Alcohol abuse Mother   . Heart disease Father   . Bladder Cancer Father   . Diabetes Father   . Hypothyroidism Father   . Dementia Father   . Uterine cancer Paternal Grandmother   . Heart disease Sister        rheumatic fever at 6 months, cardiomegaly MI  . Heart disease  Maternal Grandmother   . Stroke Maternal Grandmother   . Heart disease Maternal Grandfather   . Stroke Maternal Grandfather   . Breast cancer Cousin   . Pancreatic cancer Paternal Aunt   . Stomach cancer Paternal Uncle   . Breast cancer Paternal Aunt   . Lung cancer Paternal Uncle   . Crohn's disease Son   . Colon cancer Neg Hx   . Colon polyps Neg Hx   . Esophageal cancer Neg Hx   . Rectal cancer Neg Hx    Social History   Socioeconomic History  . Marital status: Married    Spouse name: Not on file  . Number of children: 1  . Years of education: Not on file  . Highest education level: Not on file  Occupational History  . Occupation: Retired    Fish farm manager: RETIRED  Social Needs  . Financial resource strain: Not on file  . Food insecurity:    Worry: Not on file    Inability: Not on file  . Transportation needs:    Medical: Not on file    Non-medical: Not on file  Tobacco Use  . Smoking status: Former Smoker    Types: Cigarettes    Last attempt to quit: 05/27/1973    Years since quitting: 45.4  . Smokeless tobacco: Never Used  Substance and Sexual Activity  . Alcohol use: No  . Drug use: No  . Sexual activity: Not on file  Lifestyle  . Physical activity:    Days per week: Not on file    Minutes per session: Not on file  . Stress: Not on file  Relationships  . Social connections:    Talks on phone: Not on file    Gets together: Not on file    Attends religious service: Not on file    Active member of club or organization: Not on file    Attends meetings of clubs or organizations: Not on file    Relationship status: Not on file  Other Topics Concern  . Not on file  Social History Narrative  . Not on file    Outpatient Encounter Medications as of 10/15/2018  Medication Sig  . Ascorbic Acid (VITAMIN C) 500 MG tablet Take 500 mg by mouth daily.    Marland Kitchen b complex vitamins tablet Take 1 tablet by mouth daily.  . cetirizine (ZYRTEC) 10 MG tablet Take 10 mg by mouth  daily.    . Cholecalciferol (VITAMIN D3) 1000 UNITS CAPS Take 1 tablet by mouth daily.   . Cranberry 500 MG CAPS Take 1 capsule by mouth daily.  . diphenoxylate-atropine (LOMOTIL) 2.5-0.025 MG tablet Take 1 tablet by mouth 4 (four) times daily as  needed for diarrhea or loose stools.  Marland Kitchen escitalopram (LEXAPRO) 10 MG tablet TAKE 1 TABLET BY MOUTH  DAILY  . estradiol (VIVELLE-DOT) 0.025 MG/24HR Apply 1 patch topically to skin twice weekly.  . fluticasone (FLONASE) 50 MCG/ACT nasal spray Place 2 sprays into both nostrils as needed for rhinitis or allergies.  . metroNIDAZOLE (METROCREAM) 0.75 % cream Apply 1 application topically 2 (two) times daily.   . Pancrelipase, Lip-Prot-Amyl, (CREON) 24000-76000 units CPEP TAKE 6 CAPSULES BY MOUTH  WITH EACH MEAL AND 1 TO 2  CAPSULES WITH EACH SNACK.  Marland Kitchen pantoprazole (PROTONIX) 40 MG tablet TAKE 1 TABLET BY MOUTH  DAILY  . Probiotic Product (PROBIOTIC PO) Take by mouth. Takes Probaclac probiotic once daily   . TURMERIC PO Take by mouth daily.   No facility-administered encounter medications on file as of 10/15/2018.     Activities of Daily Living In your present state of health, do you have any difficulty performing the following activities: 10/15/2018  Hearing? N  Vision? N  Comment hx cataract surgery.   Difficulty concentrating or making decisions? N  Walking or climbing stairs? N  Dressing or bathing? N  Doing errands, shopping? N  Preparing Food and eating ? N  Using the Toilet? N  In the past six months, have you accidently leaked urine? N  Do you have problems with loss of bowel control? N  Managing your Medications? N  Managing your Finances? N  Housekeeping or managing your Housekeeping? N  Some recent data might be hidden    Patient Care Team: Mosie Lukes, MD as PCP - General (Family Medicine) Deboraha Sprang, MD as Consulting Physician (Cardiology) Pyrtle, Lajuan Lines, MD as Consulting Physician (Gastroenterology) Rolm Bookbinder, MD as  Consulting Physician (Dermatology) Alphonsa Overall, MD as Consulting Physician (General Surgery) Melissa Montane, MD as Consulting Physician (Otolaryngology) Luberta Mutter, MD as Consulting Physician (Ophthalmology)    Assessment:   This is a routine wellness examination for [name]. Physical assessment deferred to PCP.  Exercise Activities and Dietary recommendations Current Exercise Habits: Home exercise routine(does yard work for several hrs per day), Exercise limited by: None identified   Diet (meal preparation, eat out, water intake, caffeinated beverages, dairy products, fruits and vegetables): in general, a "healthy" diet  , well balanced    Goals    . Healthy Lifestyle       Fall Risk Fall Risk  09/12/2016 08/03/2015 08/01/2014 04/04/2013  Falls in the past year? No No No No    Depression Screen PHQ 2/9 Scores 09/12/2016 08/03/2015 08/01/2014 04/04/2013  PHQ - 2 Score 0 0 0 0     Cognitive Function Ad8 score reviewed for issues:  Issues making decisions:no  Less interest in hobbies / activities:no  Repeats questions, stories (family complaining):no  Trouble using ordinary gadgets (microwave, computer, phone):no  Forgets the month or year: no  Mismanaging finances: no  Remembering appts:no  Daily problems with thinking and/or memory:no Ad8 score is=0   MMSE - Mini Mental State Exam 09/12/2016  Orientation to time 5  Orientation to Place 5  Registration 3  Attention/ Calculation 5  Recall 3  Language- name 2 objects 2  Language- repeat 1  Language- follow 3 step command 3  Language- read & follow direction 1  Write a sentence 1  Copy design 1  Total score 30        Immunization History  Administered Date(s) Administered  . Influenza Split 04/02/2011, 04/01/2012  . Influenza Whole 06/28/2005, 03/25/2008, 03/29/2009,  03/07/2010  . Influenza, High Dose Seasonal PF 04/02/2013, 04/14/2014, 03/31/2015, 04/03/2017  . Influenza,inj,Quad PF,6+ Mos 03/19/2016  .  Pneumococcal Conjugate-13 09/24/2013  . Pneumococcal Polysaccharide-23 05/01/2007  . Td 10/13/2017  . Tdap 05/27/2006  . Zoster 09/01/2007   Screening Tests Health Maintenance  Topic Date Due  . INFLUENZA VACCINE  12/26/2018  . COLONOSCOPY  02/09/2021  . TETANUS/TDAP  10/14/2027  . DEXA SCAN  Completed  . PNA vac Low Risk Adult  Completed       Plan:   Keep up the great work! See you next year!    I have personally reviewed and noted the following in the patient's chart:   . Medical and social history . Use of alcohol, tobacco or illicit drugs  . Current medications and supplements . Functional ability and status . Nutritional status . Physical activity . Advanced directives . List of other physicians . Hospitalizations, surgeries, and ER visits in previous 12 months . Vitals . Screenings to include cognitive, depression, and falls . Referrals and appointments  In addition, I have reviewed and discussed with patient certain preventive protocols, quality metrics, and best practice recommendations. A written personalized care plan for preventive services as well as general preventive health recommendations were provided to patient.     Shela Nevin, [place]  10/15/2018  Medical screening examination/treatment was performed by qualified clinical staff member and as supervising physician I was immediately available for consultation/collaboration. I have reviewed documentation and agree with assessment and plan.  Penni Homans, MD

## 2018-10-15 ENCOUNTER — Ambulatory Visit (INDEPENDENT_AMBULATORY_CARE_PROVIDER_SITE_OTHER): Payer: Medicare Other | Admitting: Family Medicine

## 2018-10-15 ENCOUNTER — Other Ambulatory Visit: Payer: Self-pay

## 2018-10-15 ENCOUNTER — Ambulatory Visit (INDEPENDENT_AMBULATORY_CARE_PROVIDER_SITE_OTHER): Payer: Medicare Other | Admitting: *Deleted

## 2018-10-15 ENCOUNTER — Encounter: Payer: Medicare Other | Admitting: Family Medicine

## 2018-10-15 ENCOUNTER — Encounter: Payer: Self-pay | Admitting: *Deleted

## 2018-10-15 DIAGNOSIS — K219 Gastro-esophageal reflux disease without esophagitis: Secondary | ICD-10-CM

## 2018-10-15 DIAGNOSIS — K861 Other chronic pancreatitis: Secondary | ICD-10-CM

## 2018-10-15 DIAGNOSIS — E559 Vitamin D deficiency, unspecified: Secondary | ICD-10-CM

## 2018-10-15 DIAGNOSIS — Z0000 Encounter for general adult medical examination without abnormal findings: Secondary | ICD-10-CM

## 2018-10-15 DIAGNOSIS — L259 Unspecified contact dermatitis, unspecified cause: Secondary | ICD-10-CM | POA: Diagnosis not present

## 2018-10-15 DIAGNOSIS — L309 Dermatitis, unspecified: Secondary | ICD-10-CM

## 2018-10-15 DIAGNOSIS — R002 Palpitations: Secondary | ICD-10-CM

## 2018-10-15 MED ORDER — TRIAMCINOLONE ACETONIDE 0.1 % EX CREA: 1.0000 "application " | TOPICAL_CREAM | Freq: Two times a day (BID) | CUTANEOUS | Status: DC

## 2018-10-15 NOTE — Assessment & Plan Note (Signed)
Has been working in the yard and got into poison sumac and has itchy rash on arms and left leg. Try Triamcinolone bid and witch hazel astringent, pepcid and zyrtec

## 2018-10-15 NOTE — Patient Instructions (Signed)
Keep up the great work! See you next year!   Ms. Minervini , Thank you for taking time to come for your Medicare Wellness Visit. I appreciate your ongoing commitment to your health goals. Please review the following plan we discussed and let me know if I can assist you in the future.   These are the goals we discussed: Goals     Healthy Lifestyle       This is a list of the screening recommended for you and due dates:  Health Maintenance  Topic Date Due   Flu Shot  12/26/2018   Colon Cancer Screening  02/09/2021   Tetanus Vaccine  10/14/2027   DEXA scan (bone density measurement)  Completed   Pneumonia vaccines  Completed    Health Maintenance After Age 85 After age 23, you are at a higher risk for certain long-term diseases and infections as well as injuries from falls. Falls are a major cause of broken bones and head injuries in people who are older than age 43. Getting regular preventive care can help to keep you healthy and well. Preventive care includes getting regular testing and making lifestyle changes as recommended by your health care provider. Talk with your health care provider about:  Which screenings and tests you should have. A screening is a test that checks for a disease when you have no symptoms.  A diet and exercise plan that is right for you. What should I know about screenings and tests to prevent falls? Screening and testing are the best ways to find a health problem early. Early diagnosis and treatment give you the best chance of managing medical conditions that are common after age 5. Certain conditions and lifestyle choices may make you more likely to have a fall. Your health care provider may recommend:  Regular vision checks. Poor vision and conditions such as cataracts can make you more likely to have a fall. If you wear glasses, make sure to get your prescription updated if your vision changes.  Medicine review. Work with your health care provider to  regularly review all of the medicines you are taking, including over-the-counter medicines. Ask your health care provider about any side effects that may make you more likely to have a fall. Tell your health care provider if any medicines that you take make you feel dizzy or sleepy.  Osteoporosis screening. Osteoporosis is a condition that causes the bones to get weaker. This can make the bones weak and cause them to break more easily.  Blood pressure screening. Blood pressure changes and medicines to control blood pressure can make you feel dizzy.  Strength and balance checks. Your health care provider may recommend certain tests to check your strength and balance while standing, walking, or changing positions.  Foot health exam. Foot pain and numbness, as well as not wearing proper footwear, can make you more likely to have a fall.  Depression screening. You may be more likely to have a fall if you have a fear of falling, feel emotionally low, or feel unable to do activities that you used to do.  Alcohol use screening. Using too much alcohol can affect your balance and may make you more likely to have a fall. What actions can I take to lower my risk of falls? General instructions  Talk with your health care provider about your risks for falling. Tell your health care provider if: ? You fall. Be sure to tell your health care provider about all falls, even ones  that seem minor. ? You feel dizzy, sleepy, or off-balance.  Take over-the-counter and prescription medicines only as told by your health care provider. These include any supplements.  Eat a healthy diet and maintain a healthy weight. A healthy diet includes low-fat dairy products, low-fat (lean) meats, and fiber from whole grains, beans, and lots of fruits and vegetables. Home safety  Remove any tripping hazards, such as rugs, cords, and clutter.  Install safety equipment such as grab bars in bathrooms and safety rails on  stairs.  Keep rooms and walkways well-lit. Activity   Follow a regular exercise program to stay fit. This will help you maintain your balance. Ask your health care provider what types of exercise are appropriate for you.  If you need a cane or walker, use it as recommended by your health care provider.  Wear supportive shoes that have nonskid soles. Lifestyle  Do not drink alcohol if your health care provider tells you not to drink.  If you drink alcohol, limit how much you have: ? 0-1 drink a day for women. ? 0-2 drinks a day for men.  Be aware of how much alcohol is in your drink. In the [place] one drink equals one typical bottle of beer (12 oz), one-half glass of wine (5 oz), or one shot of hard liquor (1 oz).  Do not use any products that contain nicotine or tobacco, such as cigarettes and e-cigarettes. If you need help quitting, ask your health care provider. Summary  Having a healthy lifestyle and getting preventive care can help to protect your health and wellness after age 56.  Screening and testing are the best way to find a health problem early and help you avoid having a fall. Early diagnosis and treatment give you the best chance for managing medical conditions that are more common for people who are older than age 65.  Falls are a major cause of broken bones and head injuries in people who are older than age 97. Take precautions to prevent a fall at home.  Work with your health care provider to learn what changes you can make to improve your health and wellness and to prevent falls. This information is not intended to replace advice given to you by your health care provider. Make sure you discuss any questions you have with your health care provider. Document Released: 03/26/2017 Document Revised: 03/26/2017 Document Reviewed: 03/26/2017 Elsevier Interactive Patient Education  2019 [employer].

## 2018-10-19 DIAGNOSIS — L309 Dermatitis, unspecified: Secondary | ICD-10-CM | POA: Insufficient documentation

## 2018-10-19 NOTE — Assessment & Plan Note (Signed)
Triamcinolone ointment to use prn

## 2018-10-19 NOTE — Assessment & Plan Note (Signed)
She is doing well and has not had any recent exacerbation

## 2018-10-19 NOTE — Assessment & Plan Note (Signed)
No recent exacerbation. She is following with cardiology

## 2018-10-19 NOTE — Assessment & Plan Note (Signed)
Supplement and monitor 

## 2018-10-19 NOTE — Assessment & Plan Note (Signed)
Avoid offending foods, start probiotics. Do not eat large meals in late evening and consider raising head of bed.  

## 2018-10-19 NOTE — Progress Notes (Signed)
Virtual Visit via Video Note  I connected with [name] on 10/19/18 at 10:00 AM EDT by a video enabled telemedicine application and verified that I am speaking with the correct person using two identifiers.  Location: Patient: home Provider: office   I discussed the limitations of evaluation and management by telemedicine and the availability of in person appointments. The patient expressed understanding and agreed to proceed.    Subjective:    Patient ID: [name], female    DOB: 08-08-1940, 78 y.o.   MRN: [id number]  No chief complaint on file.   HPI Patient is in today for follow up on her chronic medical concerns including Vitamin D deficiency, anxiety, pancreatitis and more. She feels well. No recent febrile illness or hospitalizations. Denies CP/palp/SOB/HA/congestion/fevers/GI or GU c/o. Taking meds as prescribed  She has been working in the yard and has an itchy rash she thinks is from [facility].   Past Medical History:  Diagnosis Date  . Allergic state   . Allergy   . ANXIETY, CHRONIC   . 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 CHEST PAIN    LHC 02/2011: normal cors, EF 65%  . Contact dermatitis 10/13/2017  . Diverticulosis   . Esophageal reflux   . Fibrocystic breast   . Hand injury, left, initial encounter 10/13/2017  . Hepatic cyst   . HYPOTENSION, ORTHOSTATIC   . IBS (irritable bowel syndrome)   . Internal hemorrhoids without mention of complication   . Kidney lesion   . Kidney stone   . Liver cyst   . Osteoarthritis   . Osteopenia   . Osteopenia   . Osteoporosis   . Pain in ear   . PALPITATIONS, RECURRENT   . Pancreatitis chronic   . Preventative health care   . PSVT   . 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 SHOULDER PAIN   . TOBACCO USE, QUIT   . VITAMIN D DEFICIENCY     Past Surgical History:  Procedure Laterality Date  . ABDOMINAL HYSTERECTOMY  1998  . BREAST SURGERY     breast bx on left, benign  . CARDIAC  CATHETERIZATION    . CATARACT EXTRACTION, BILATERAL    . CATARACT EXTRACTION, BILATERAL    . CHOLECYSTECTOMY  1999  . COLONOSCOPY    . EYE SURGERY Bilateral 04/2017, 05/2017   cataracts  . [place]  . TUBAL LIGATION     with D&C    Family History  Problem Relation Age of Onset  . Hypertension Mother   . Stroke Mother   . Alcohol abuse Mother   . Heart disease Father   . Bladder Cancer Father   . Diabetes Father   . Hypothyroidism Father   . Dementia Father   . Uterine cancer Paternal Grandmother   . Heart disease Sister        rheumatic fever at 6 months, cardiomegaly MI  . Heart disease Maternal Grandmother   . Stroke Maternal Grandmother   . Heart disease Maternal Grandfather   . Stroke Maternal Grandfather   . Breast cancer Cousin   . Pancreatic cancer Paternal Aunt   . Stomach cancer Paternal Uncle   . Breast cancer Paternal Aunt   . Lung cancer Paternal Uncle   . Crohn's disease Son   . Colon cancer Neg Hx   . Colon polyps Neg Hx   . Esophageal cancer Neg Hx   . Rectal cancer Neg Hx     Social  History   Socioeconomic History  . Marital status: Married    Spouse name: Not on file  . Number of children: 1  . Years of education: Not on file  . Highest education level: Not on file  Occupational History  . Occupation: Retired    Fish farm manager: RETIRED  Social Needs  . Financial resource strain: Not on file  . Food insecurity:    Worry: Not on file    Inability: Not on file  . Transportation needs:    Medical: Not on file    Non-medical: Not on file  Tobacco Use  . Smoking status: Former Smoker    Types: Cigarettes    Last attempt to quit: 05/27/1973    Years since quitting: 45.4  . Smokeless tobacco: Never Used  Substance and Sexual Activity  . Alcohol use: No  . Drug use: No  . Sexual activity: Not on file  Lifestyle  . Physical activity:    Days per week: Not on file    Minutes per session: Not on file  . Stress: Not on file   Relationships  . Social connections:    Talks on phone: Not on file    Gets together: Not on file    Attends religious service: Not on file    Active member of club or organization: Not on file    Attends meetings of clubs or organizations: Not on file    Relationship status: Not on file  . Intimate partner violence:    Fear of current or ex partner: Not on file    Emotionally abused: Not on file    Physically abused: Not on file    Forced sexual activity: Not on file  Other Topics Concern  . Not on file  Social History Narrative  . Not on file    Outpatient Medications Prior to Visit  Medication Sig Dispense Refill  . Ascorbic Acid (VITAMIN C) 500 MG tablet Take 500 mg by mouth daily.      Marland Kitchen b complex vitamins tablet Take 1 tablet by mouth daily.    . cetirizine (ZYRTEC) 10 MG tablet Take 10 mg by mouth daily.      . Cholecalciferol (VITAMIN D3) 1000 UNITS CAPS Take 1 tablet by mouth daily.     . Cranberry 500 MG CAPS Take 1 capsule by mouth daily.    . diphenoxylate-atropine (LOMOTIL) 2.5-0.025 MG tablet Take 1 tablet by mouth 4 (four) times daily as needed for diarrhea or loose stools. 360 tablet 2  . escitalopram (LEXAPRO) 10 MG tablet TAKE 1 TABLET BY MOUTH  DAILY 90 tablet 3  . estradiol (VIVELLE-DOT) 0.025 MG/24HR Apply 1 patch topically to skin twice weekly. 24 patch 11  . fluticasone (FLONASE) 50 MCG/ACT nasal spray Place 2 sprays into both nostrils as needed for rhinitis or allergies. 16 g 0  . metroNIDAZOLE (METROCREAM) 0.75 % cream Apply 1 application topically 2 (two) times daily.     . Pancrelipase, Lip-Prot-Amyl, (CREON) 24000-76000 units CPEP TAKE 6 CAPSULES BY MOUTH  WITH EACH MEAL AND 1 TO 2  CAPSULES WITH EACH SNACK. 2100 capsule 1  . pantoprazole (PROTONIX) 40 MG tablet TAKE 1 TABLET BY MOUTH  DAILY 90 tablet 3  . Probiotic Product (PROBIOTIC PO) Take by mouth. Takes Probaclac probiotic once daily     . TURMERIC PO Take by mouth daily.     No  facility-administered medications prior to visit.     Allergies  Allergen Reactions  . Amoxicillin  REACTION: unspecified  . Azithromycin     REACTION: Rash  . Ciprofloxacin     REACTION: GI upset  . Epinephrine     REACTION: unspecified  . Penicillins     REACTION: rash  . Sulfonamide Derivatives     REACTION: sick to stomach  . Tamiflu [Oseltamivir Phosphate] Hives    ROS     Objective:    Physical Exam  There were no vitals taken for this visit. Wt Readings from Last 3 Encounters:  10/01/18 123 lb (55.8 kg)  07/29/18 125 lb 2 oz (56.8 kg)  07/22/18 123 lb (55.8 kg)    Diabetic Foot Exam - Simple   No data filed     Lab Results  Component Value Date   WBC 13.2 (H) 03/20/2018   HGB 13.6 03/20/2018   HCT 41.6 03/20/2018   PLT 282 03/20/2018   GLUCOSE 157 (H) 03/20/2018   CHOL 146 09/12/2016   TRIG 26.0 09/12/2016   HDL 102.30 09/12/2016   LDLCALC 38 09/12/2016   ALT 16 06/24/2018   AST 17 06/24/2018   NA 139 03/20/2018   K 3.4 (L) 03/20/2018   CL 103 03/20/2018   CREATININE 1.03 (H) 03/20/2018   BUN 15 03/20/2018   CO2 25 03/20/2018   TSH 2.21 10/13/2017   INR 1.00 03/22/2011    Lab Results  Component Value Date   TSH 2.21 10/13/2017   Lab Results  Component Value Date   WBC 13.2 (H) 03/20/2018   HGB 13.6 03/20/2018   HCT 41.6 03/20/2018   MCV 92.2 03/20/2018   PLT 282 03/20/2018   Lab Results  Component Value Date   NA 139 03/20/2018   K 3.4 (L) 03/20/2018   CO2 25 03/20/2018   GLUCOSE 157 (H) 03/20/2018   BUN 15 03/20/2018   CREATININE 1.03 (H) 03/20/2018   BILITOT 0.4 06/24/2018   ALKPHOS 56 06/24/2018   AST 17 06/24/2018   ALT 16 06/24/2018   PROT 6.1 06/24/2018   ALBUMIN 3.9 06/24/2018   CALCIUM 8.8 (L) 03/20/2018   ANIONGAP 11 03/20/2018   GFR 55.75 (L) 10/13/2017   Lab Results  Component Value Date   CHOL 146 09/12/2016   Lab Results  Component Value Date   HDL 102.30 09/12/2016   Lab Results  Component  Value Date   LDLCALC 38 09/12/2016   Lab Results  Component Value Date   TRIG 26.0 09/12/2016   Lab Results  Component Value Date   CHOLHDL 1 09/12/2016   No results found for: HGBA1C     Assessment & Plan:   Problem List Items Addressed This Visit    Vitamin D deficiency    Supplement and monitor      GERD    Avoid offending foods, start probiotics. Do not eat large meals in late evening and consider raising head of bed.       PALPITATIONS, RECURRENT    No recent exacerbation. She is following with cardiology      Autoimmune pancreatitis [facility])    She is doing well and has not had any recent exacerbation      Contact dermatitis    Has been working in the yard and got into poison [place] and has itchy rash on arms and left leg. Try Triamcinolone bid and witch hazel astringent, pepcid and zyrtec      Dermatitis    Triamcinolone ointment to use prn         I am having Pearl J.  Hardie Shackleton start on triamcinolone cream. I am also having her maintain her vitamin C, Vitamin D3, cetirizine, Probiotic Product (PROBIOTIC PO), Cranberry, TURMERIC PO, metroNIDAZOLE, fluticasone, estradiol, escitalopram, b complex vitamins, pantoprazole, diphenoxylate-atropine, and Pancrelipase (Lip-Prot-Amyl).  Meds ordered this encounter  Medications  . triamcinolone cream (KENALOG) 0.1 %    Sig: Apply 1 application topically 2 (two) times daily.    Dispense:  80 g    Refill:  01   I discussed the assessment and treatment plan with the patient. The patient was provided an opportunity to ask questions and all were answered. The patient agreed with the plan and demonstrated an understanding of the instructions.   The patient was advised to call back or seek an in-person evaluation if the symptoms worsen or if the condition fails to improve as anticipated.  I provided 15 minutes of non-face-to-face time during this encounter.   Penni Homans, MD

## 2018-10-26 ENCOUNTER — Other Ambulatory Visit (INDEPENDENT_AMBULATORY_CARE_PROVIDER_SITE_OTHER): Payer: Medicare Other

## 2018-10-26 ENCOUNTER — Other Ambulatory Visit: Payer: Self-pay

## 2018-10-26 DIAGNOSIS — R002 Palpitations: Secondary | ICD-10-CM | POA: Diagnosis not present

## 2018-10-26 DIAGNOSIS — E559 Vitamin D deficiency, unspecified: Secondary | ICD-10-CM | POA: Diagnosis not present

## 2018-10-26 DIAGNOSIS — K861 Other chronic pancreatitis: Secondary | ICD-10-CM

## 2018-10-26 DIAGNOSIS — I951 Orthostatic hypotension: Secondary | ICD-10-CM | POA: Diagnosis not present

## 2018-10-26 DIAGNOSIS — R079 Chest pain, unspecified: Secondary | ICD-10-CM

## 2018-10-26 LAB — COMPREHENSIVE METABOLIC PANEL
ALT: 16 U/L (ref 0–35)
AST: 16 U/L (ref 0–37)
Albumin: 3.9 g/dL (ref 3.5–5.2)
Alkaline Phosphatase: 69 U/L (ref 39–117)
BUN: 18 mg/dL (ref 6–23)
CO2: 31 mEq/L (ref 19–32)
Calcium: 9.1 mg/dL (ref 8.4–10.5)
Chloride: 104 mEq/L (ref 96–112)
Creatinine, Ser: 0.98 mg/dL (ref 0.40–1.20)
GFR: 54.79 mL/min — ABNORMAL LOW (ref >60.00)
Glucose, Bld: 105 mg/dL — ABNORMAL HIGH (ref 70–99)
Potassium: 4.3 mEq/L (ref 3.5–5.1)
Sodium: 142 mEq/L (ref 135–145)
Total Bilirubin: 0.5 mg/dL (ref 0.2–1.2)
Total Protein: 6.1 g/dL (ref 6.0–8.3)

## 2018-10-26 LAB — CBC WITH DIFFERENTIAL/PLATELET
Basophils Absolute: 0 10*3/uL (ref 0.0–0.1)
Basophils Relative: 0.7 % (ref 0.0–3.0)
Eosinophils Absolute: 0.1 10*3/uL (ref 0.0–0.7)
Eosinophils Relative: 1.2 % (ref 0.0–5.0)
HCT: 40.4 % (ref 36.0–46.0)
Hemoglobin: 13.8 g/dL (ref 12.0–15.0)
Lymphocytes Relative: 28.4 % (ref 12.0–46.0)
Lymphs Abs: 1.7 10*3/uL (ref 0.7–4.0)
MCHC: 34.3 g/dL (ref 30.0–36.0)
MCV: 92.5 fl (ref 78.0–100.0)
Monocytes Absolute: 0.4 10*3/uL (ref 0.1–1.0)
Monocytes Relative: 6.5 % (ref 3.0–12.0)
Neutro Abs: 3.8 10*3/uL (ref 1.4–7.7)
Neutrophils Relative %: 63.2 % (ref 43.0–77.0)
Platelets: 261 10*3/uL (ref 150.0–400.0)
RBC: 4.36 Mil/uL (ref 3.87–5.11)
RDW: 14.5 % (ref 11.5–15.5)
WBC: 6.1 10*3/uL (ref 4.0–10.5)

## 2018-10-26 LAB — AMYLASE: Amylase: 81 U/L (ref 27–131)

## 2018-10-26 LAB — LIPASE: Lipase: 68 U/L — ABNORMAL HIGH (ref 11.0–59.0)

## 2018-10-26 LAB — TSH: TSH: 3.41 u[IU]/mL (ref 0.35–4.50)

## 2018-11-20 ENCOUNTER — Ambulatory Visit (HOSPITAL_BASED_OUTPATIENT_CLINIC_OR_DEPARTMENT_OTHER)
Admission: RE | Admit: 2018-11-20 | Discharge: 2018-11-20 | Disposition: A | Discharge status: Home / Self Care | Payer: Medicare Other | Source: Ambulatory Visit | Attending: Family | Admitting: Family

## 2018-11-20 ENCOUNTER — Telehealth: Payer: Self-pay

## 2018-11-20 ENCOUNTER — Encounter: Payer: Self-pay | Admitting: Family

## 2018-11-20 ENCOUNTER — Other Ambulatory Visit: Payer: Self-pay

## 2018-11-20 ENCOUNTER — Ambulatory Visit (INDEPENDENT_AMBULATORY_CARE_PROVIDER_SITE_OTHER): Payer: Medicare Other | Admitting: Family

## 2018-11-20 VITALS — BP 149/84 | HR 63 | Temp 97.8°F | Resp 16 | Ht 65.5 in | Wt 121.8 lb

## 2018-11-20 DIAGNOSIS — R55 Syncope and collapse: Secondary | ICD-10-CM

## 2018-11-20 DIAGNOSIS — M545 Low back pain, unspecified: Secondary | ICD-10-CM

## 2018-11-20 DIAGNOSIS — S32010A Wedge compression fracture of first lumbar vertebra, initial encounter for closed fracture: Secondary | ICD-10-CM

## 2018-11-20 LAB — COMPREHENSIVE METABOLIC PANEL
ALT: 12 U/L (ref 0–35)
AST: 14 U/L (ref 0–37)
Albumin: 4 g/dL (ref 3.5–5.2)
Alkaline Phosphatase: 59 U/L (ref 39–117)
BUN: 19 mg/dL (ref 6–23)
CO2: 30 mEq/L (ref 19–32)
Calcium: 9.1 mg/dL (ref 8.4–10.5)
Chloride: 104 mEq/L (ref 96–112)
Creatinine, Ser: 0.93 mg/dL (ref 0.40–1.20)
GFR: 58.19 mL/min — ABNORMAL LOW (ref >60.00)
Glucose, Bld: 89 mg/dL (ref 70–99)
Potassium: 4.8 mEq/L (ref 3.5–5.1)
Sodium: 140 mEq/L (ref 135–145)
Total Bilirubin: 0.4 mg/dL (ref 0.2–1.2)
Total Protein: 6.1 g/dL (ref 6.0–8.3)

## 2018-11-20 LAB — CBC WITH DIFFERENTIAL/PLATELET
Basophils Absolute: 0 10*3/uL (ref 0.0–0.1)
Basophils Relative: 0.6 % (ref 0.0–3.0)
Eosinophils Absolute: 0.1 10*3/uL (ref 0.0–0.7)
Eosinophils Relative: 1.3 % (ref 0.0–5.0)
HCT: 38.5 % (ref 36.0–46.0)
Hemoglobin: 12.9 g/dL (ref 12.0–15.0)
Lymphocytes Relative: 22.5 % (ref 12.0–46.0)
Lymphs Abs: 1.5 10*3/uL (ref 0.7–4.0)
MCHC: 33.6 g/dL (ref 30.0–36.0)
MCV: 93.2 fl (ref 78.0–100.0)
Monocytes Absolute: 0.5 10*3/uL (ref 0.1–1.0)
Monocytes Relative: 6.9 % (ref 3.0–12.0)
Neutro Abs: 4.6 10*3/uL (ref 1.4–7.7)
Neutrophils Relative %: 68.7 % (ref 43.0–77.0)
Platelets: 240 10*3/uL (ref 150.0–400.0)
RBC: 4.13 Mil/uL (ref 3.87–5.11)
RDW: 14.2 % (ref 11.5–15.5)
WBC: 6.8 10*3/uL (ref 4.0–10.5)

## 2018-11-20 NOTE — Telephone Encounter (Signed)
Needs visit please.  

## 2018-11-20 NOTE — Progress Notes (Signed)
Subjective:    Patient ID: [name], female    DOB: [birth year], 78 y.o.   MRN: [id number]  HPI  Patient is a 78 yr old female who presents today with chief complaint of low back pain. Pain began after she did some heavy lifting on 11/14/18.  She reports that she was lifting cement pavers and heard her back "pop."  Reports that she has been using some muscle relaxers she had on hand. Reports pain is worst with turning or sitting up from the bed. She has known history of osteoporosis.   Syncope- reports that she had one syncopal event which occurred in march after she was treated for shingles.   Second fall was last Saturday.  Pt had a brief unwitnessed syncopal event last Saturday.  Felt dizzy/felt sick to her stomach and then next thing she knew she was on the ground.  She denies SOB or chest pain.  Denies calf pain or swelling.  She has discussed the syncope previously with her electrophysiologist (who recommended compression stocking which she has not yet purchased) and with her PCP.    Review of Systems See HPI  Past Medical History:  Diagnosis Date  . Allergic state   . Allergy   . ANXIETY, CHRONIC   . 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 CHEST PAIN    LHC 02/2011: normal cors, EF 65%  . Contact dermatitis 10/13/2017  . Diverticulosis   . Esophageal reflux   . Fibrocystic breast   . Hand injury, left, initial encounter 10/13/2017  . Hepatic cyst   . HYPOTENSION, ORTHOSTATIC   . IBS (irritable bowel syndrome)   . Internal hemorrhoids without mention of complication   . Kidney lesion   . Kidney stone   . Liver cyst   . Osteoarthritis   . Osteopenia   . Osteopenia   . Osteoporosis   . Pain in ear   . PALPITATIONS, RECURRENT   . Pancreatitis chronic   . Preventative health care   . PSVT   . 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 SHOULDER PAIN   . TOBACCO USE, QUIT   . VITAMIN D DEFICIENCY      Social History   Socioeconomic History  . Marital  status: Married    Spouse name: Not on file  . Number of children: 1  . Years of education: Not on file  . Highest education level: Not on file  Occupational History  . Occupation: Retired    Fish farm manager: RETIRED  Social Needs  . Financial resource strain: Not on file  . Food insecurity    Worry: Not on file    Inability: Not on file  . Transportation needs    Medical: Not on file    Non-medical: Not on file  Tobacco Use  . Smoking status: Former Smoker    Types: Cigarettes    Quit date: 05/27/1973    Years since quitting: 45.5  . Smokeless tobacco: Never Used  Substance and Sexual Activity  . Alcohol use: No  . Drug use: No  . Sexual activity: Not on file  Lifestyle  . Physical activity    Days per week: Not on file    Minutes per session: Not on file  . Stress: Not on file  Relationships  . Social Herbalist on phone: Not on file    Gets together: Not on file    Attends religious service: Not on file    Active member  of club or organization: Not on file    Attends meetings of clubs or organizations: Not on file    Relationship status: Not on file  . Intimate partner violence    Fear of current or ex partner: Not on file    Emotionally abused: Not on file    Physically abused: Not on file    Forced sexual activity: Not on file  Other Topics Concern  . Not on file  Social History Narrative  . Not on file    Past Surgical History:  Procedure Laterality Date  . ABDOMINAL HYSTERECTOMY  1998  . BREAST SURGERY     breast bx on left, benign  . CARDIAC CATHETERIZATION    . CATARACT EXTRACTION, BILATERAL    . CATARACT EXTRACTION, BILATERAL    . CHOLECYSTECTOMY  1999  . COLONOSCOPY    . EYE SURGERY Bilateral 04/2017, 05/2017   cataracts  . [place]  . TUBAL LIGATION     with D&C    Family History  Problem Relation Age of Onset  . Hypertension Mother   . Stroke Mother   . Alcohol abuse Mother   . Heart disease Father   . Bladder Cancer  Father   . Diabetes Father   . Hypothyroidism Father   . Dementia Father   . Uterine cancer Paternal Grandmother   . Heart disease Sister        rheumatic fever at 6 months, cardiomegaly MI  . Heart disease Maternal Grandmother   . Stroke Maternal Grandmother   . Heart disease Maternal Grandfather   . Stroke Maternal Grandfather   . Breast cancer Cousin   . Pancreatic cancer Paternal Aunt   . Stomach cancer Paternal Uncle   . Breast cancer Paternal Aunt   . Lung cancer Paternal Uncle   . Crohn's disease Son   . Colon cancer Neg Hx   . Colon polyps Neg Hx   . Esophageal cancer Neg Hx   . Rectal cancer Neg Hx     Allergies  Allergen Reactions  . Amoxicillin     REACTION: unspecified  . Azithromycin     REACTION: Rash  . Ciprofloxacin     REACTION: GI upset  . Epinephrine     REACTION: unspecified  . Penicillins     REACTION: rash  . Sulfonamide Derivatives     REACTION: sick to stomach  . Tamiflu [Oseltamivir Phosphate] Hives    Current Outpatient Medications on File Prior to Visit  Medication Sig Dispense Refill  . Ascorbic Acid (VITAMIN C) 500 MG tablet Take 500 mg by mouth daily.      Marland Kitchen b complex vitamins tablet Take 1 tablet by mouth daily.    . cetirizine (ZYRTEC) 10 MG tablet Take 10 mg by mouth daily.      . Cholecalciferol (VITAMIN D3) 1000 UNITS CAPS Take 1 tablet by mouth daily.     . Cranberry 500 MG CAPS Take 1 capsule by mouth daily.    . diphenoxylate-atropine (LOMOTIL) 2.5-0.025 MG tablet Take 1 tablet by mouth 4 (four) times daily as needed for diarrhea or loose stools. 360 tablet 2  . escitalopram (LEXAPRO) 10 MG tablet TAKE 1 TABLET BY MOUTH  DAILY 90 tablet 3  . estradiol (VIVELLE-DOT) 0.025 MG/24HR Apply 1 patch topically to skin twice weekly. 24 patch 11  . fluticasone (FLONASE) 50 MCG/ACT nasal spray Place 2 sprays into both nostrils as needed for rhinitis or allergies. 16 g 0  .  metroNIDAZOLE (METROCREAM) 0.75 % cream Apply 1 application  topically 2 (two) times daily.     . Pancrelipase, Lip-Prot-Amyl, (CREON) 24000-76000 units CPEP TAKE 6 CAPSULES BY MOUTH  WITH EACH MEAL AND 1 TO 2  CAPSULES WITH EACH SNACK. 2100 capsule 1  . pantoprazole (PROTONIX) 40 MG tablet TAKE 1 TABLET BY MOUTH  DAILY 90 tablet 3  . Probiotic Product (PROBIOTIC PO) Take by mouth. Takes Probaclac probiotic once daily     . triamcinolone cream (KENALOG) 0.1 % Apply 1 application topically 2 (two) times daily. 80 g 01  . TURMERIC PO Take by mouth daily.     No current facility-administered medications on file prior to visit.     BP (!) 149/84 (BP Location: Right Arm, Patient Position: Sitting, Cuff Size: Small)   Pulse 63   Temp 97.8 F (36.6 C) (Oral)   Resp 16   Ht 5' 5.5" (1.664 m)   Wt 121 lb 12.8 oz (55.2 kg)   SpO2 100%   BMI 19.96 kg/m       Objective:   Physical Exam Constitutional:      Appearance: She is well-developed.  Neck:     Musculoskeletal: Neck supple.     Thyroid: No thyromegaly.  Cardiovascular:     Rate and Rhythm: Normal rate and regular rhythm.     Heart sounds: Normal heart sounds. No murmur.  Pulmonary:     Effort: Pulmonary effort is normal. No respiratory distress.     Breath sounds: Normal breath sounds. No wheezing.  Skin:    General: Skin is warm and dry.  Neurological:     Mental Status: She is alert and oriented to person, place, and time.  Psychiatric:        Behavior: Behavior normal.        Thought Content: Thought content normal.        Judgment: Judgment normal.           Assessment & Plan:  1) Compression fracture- plain film of the lumbar spine reveals:  Mild wedge compression of L1 Vertebral body.  Will refer for MRI for further evaluation.  2) Syncope- advised pt to stand slowly, purchase otc compression stockings to see if this helps and to contact her Electrophysiologist if she has recurrent syncope.

## 2018-11-20 NOTE — Telephone Encounter (Signed)
Copied from [place] [id number]. Topic: General - Other >> Nov 20, 2018  8:17 AM Oneta Rack wrote: Patient was moving furniture and pulled a muscle experiencing back pain for 3 days , patient states Dr. Nani Ravens prescribed in the past muscle relaxer. Patient states if pain persist will follow up with orthopedic requesting Rx. Please advise

## 2018-11-20 NOTE — Patient Instructions (Signed)
Please complete x-ray on the first floor.  

## 2018-11-22 ENCOUNTER — Telehealth: Payer: Self-pay | Admitting: Family

## 2018-11-22 DIAGNOSIS — M545 Low back pain, unspecified: Secondary | ICD-10-CM

## 2018-11-22 NOTE — Telephone Encounter (Signed)
Please contact pt and let her know that the x-ray dose show a compression fracture in her spine.  It is not clear on x-ray if it is a new fracture or an old fracture. I would recommend that she complete an MRI for further evaluation. I have placed this order.

## 2018-11-23 ENCOUNTER — Encounter: Payer: Self-pay | Admitting: Family

## 2018-11-23 MED ORDER — TIZANIDINE HCL 4 MG PO CAPS: ORAL_CAPSULE | ORAL | 0 refills | Status: DC

## 2018-11-23 NOTE — Telephone Encounter (Signed)
Patient advised refill was sent

## 2018-11-23 NOTE — Telephone Encounter (Signed)
Appt w/ Melissa.

## 2018-11-23 NOTE — Telephone Encounter (Signed)
Refill sent to [facility]. 

## 2018-11-23 NOTE — Telephone Encounter (Signed)
Patient advised of results, she is scheduled for MRI 12-12-18 ("this is earliest they had")   Patient will like a refill on Tizanidine given to her by Dr. Nani Ravens, she has a few more pills.

## 2018-12-01 NOTE — Telephone Encounter (Signed)
FYI

## 2018-12-01 NOTE — Telephone Encounter (Signed)
Noted  

## 2018-12-01 NOTE — Telephone Encounter (Signed)
Pt called to advise [name] that she has a Compression Fracture in a Vertebrae and was schedule for an MRI on July 18th but she went and saw an Ortho PA at [facility] and they are able to get her in for an MRI on July 9th and then on July 16th she will see Dr. Nelva Bush at [facility] to determine the next steps/

## 2018-12-12 ENCOUNTER — Other Ambulatory Visit: Payer: Medicare Other

## 2019-04-20 ENCOUNTER — Ambulatory Visit: Payer: Medicare Other | Admitting: Family Medicine

## 2019-04-21 ENCOUNTER — Other Ambulatory Visit: Payer: Self-pay | Admitting: Internal Medicine

## 2019-05-22 IMAGING — CT CT RENAL STONE PROTOCOL
2 of 4 series · 16 of 46 positions shown, 18 images · non-contrast
Comparison: 04/18/2014, 12/14/2008

CLINICAL DATA: RIGHT flank pain since last night with nausea and
vomiting, history of autoimmune sclerosing pancreatitis, irritable
bowel syndrome, kidney stones, former smoker

EXAM:
CT ABDOMEN AND PELVIS WITHOUT CONTRAST
TECHNIQUE: Multidetector CT imaging of the abdomen and pelvis was performed
following the standard protocol without IV contrast. Sagittal and
coronal MPR images reconstructed from axial data set. Oral contrast
was not administered

[Series 2: axial st · axial · 0.72mm/px · z∈[+781,+1151]mm · 13 of 82 slices shown, 15 images]
[im 4/82  soft-tissue]
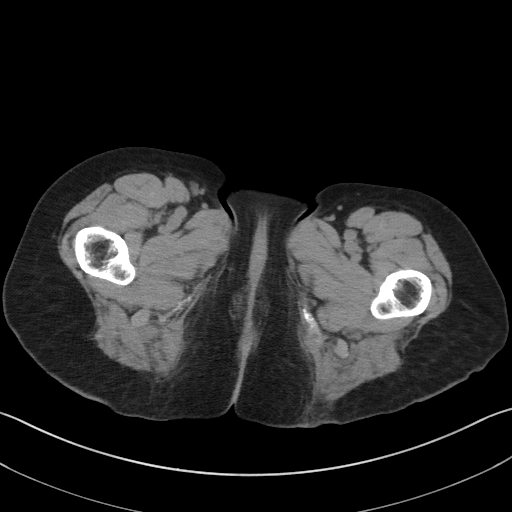
[im 4/82  bone]
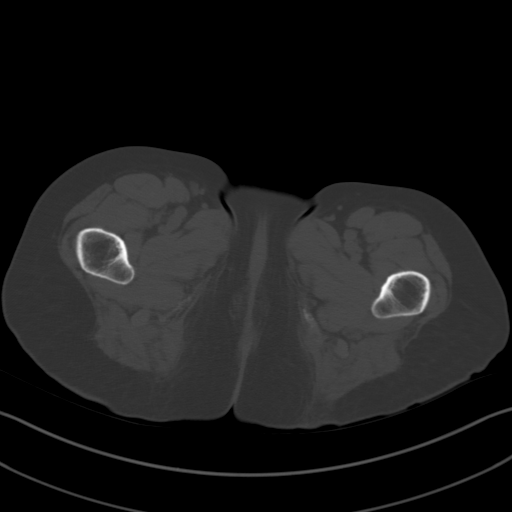
[im 10/82  soft-tissue]
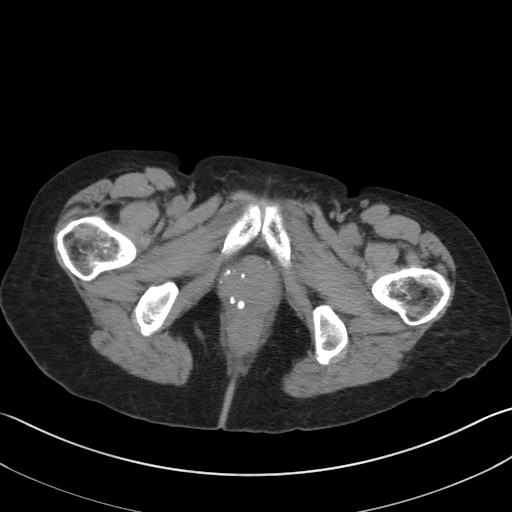
[im 16/82  soft-tissue]
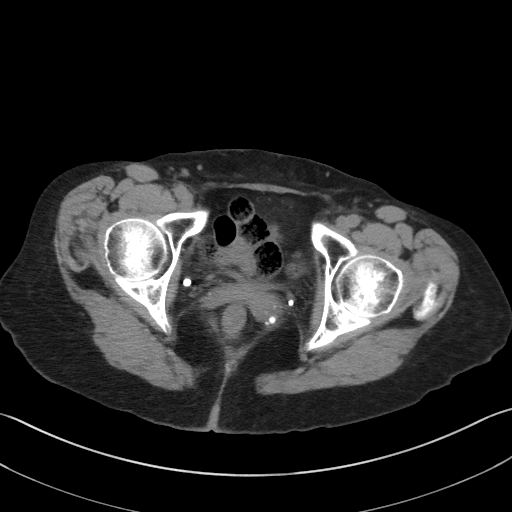
[im 22/82  soft-tissue]
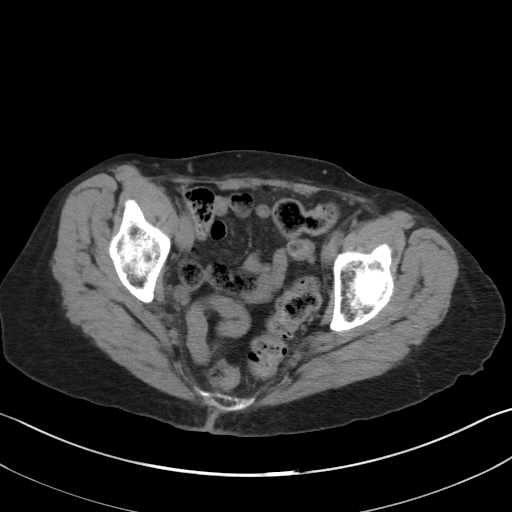
[im 29/82  soft-tissue]
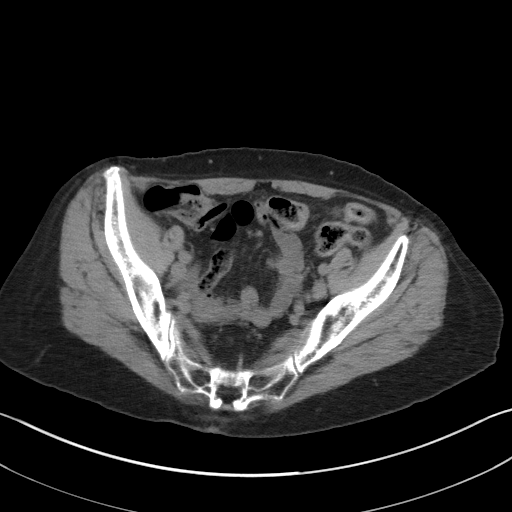
[im 35/82  soft-tissue]
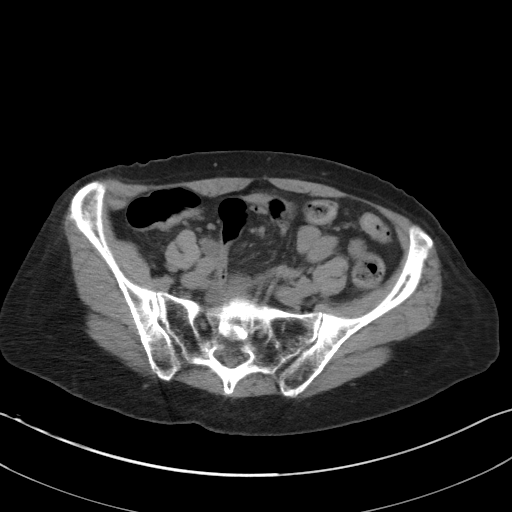
[im 41/82  soft-tissue]
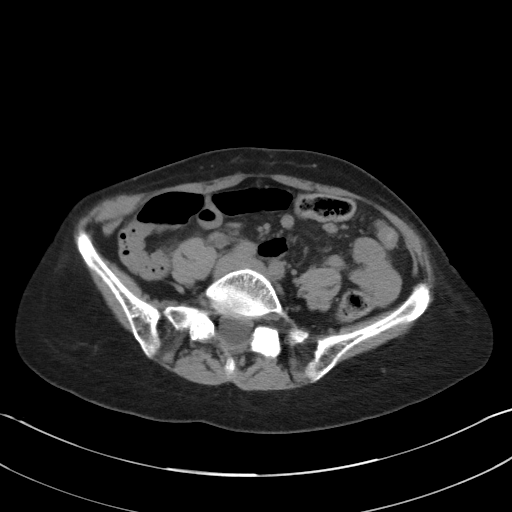
[im 47/82  soft-tissue]
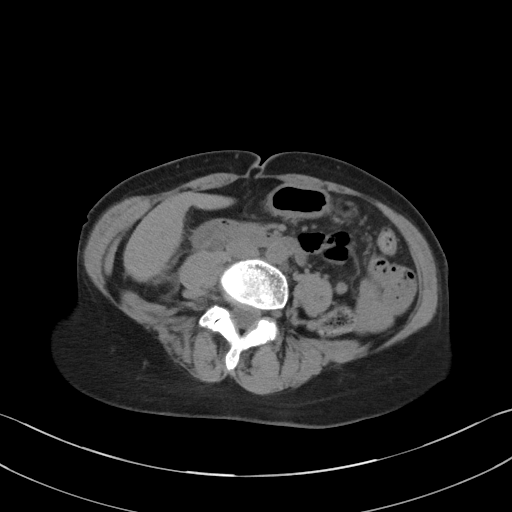
[im 53/82  soft-tissue]
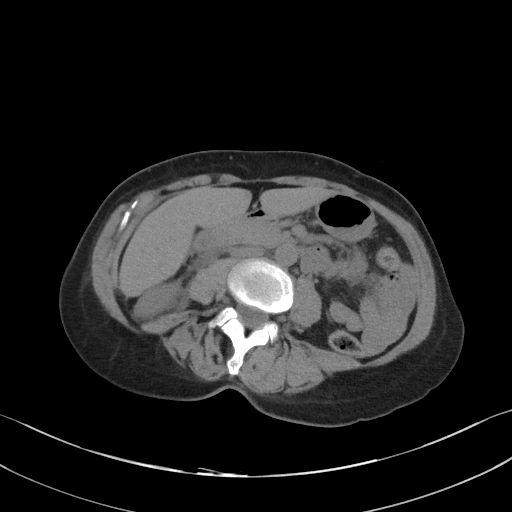
[im 53/82  bone]
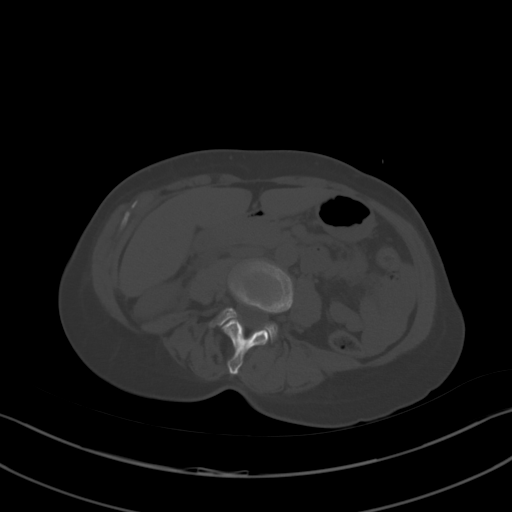
[im 60/82  soft-tissue]
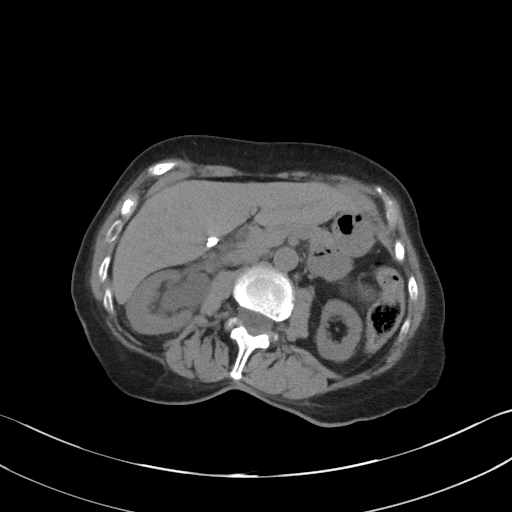
[im 66/82  soft-tissue]
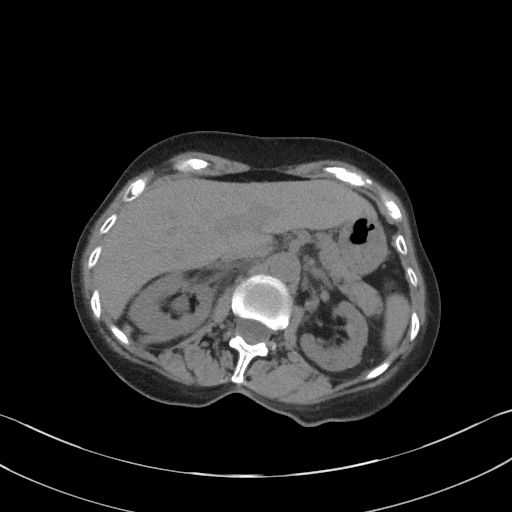
[im 72/82  soft-tissue]
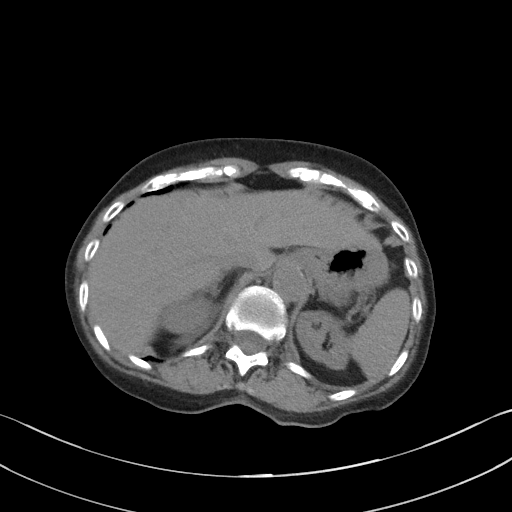
[im 78/82  soft-tissue]
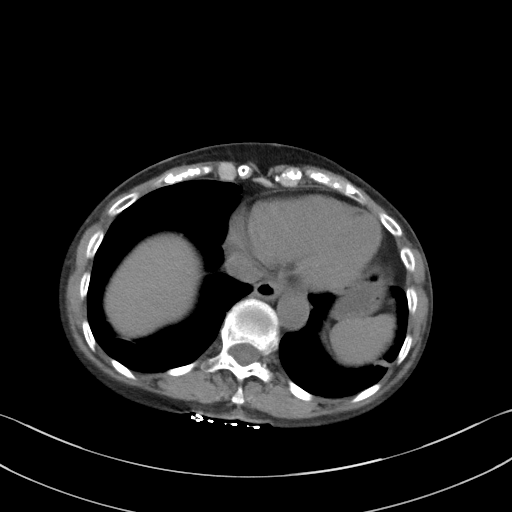

[Series 5: coronal st · coronal · 0.68mm/px · 3 of 65 slices shown]
[im 22/65  soft-tissue]
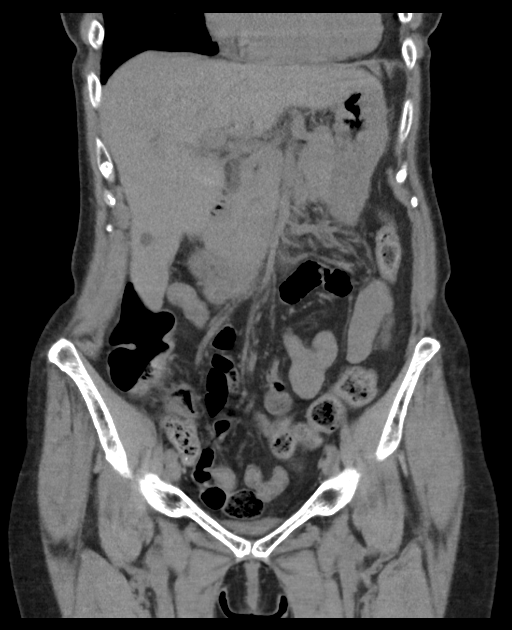
[im 29/65  soft-tissue]
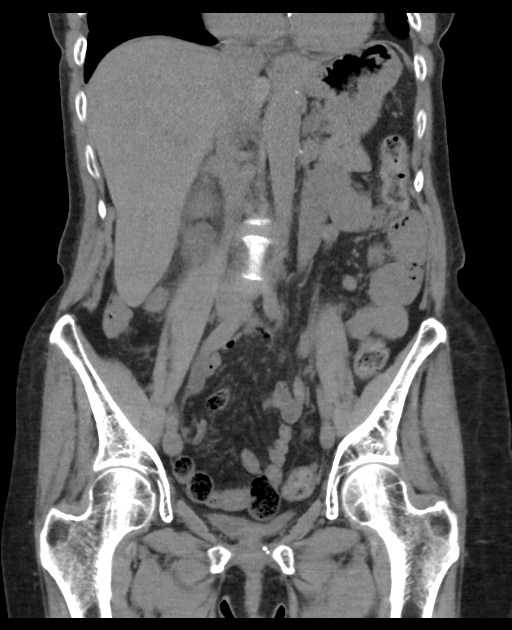
[im 36/65  soft-tissue]
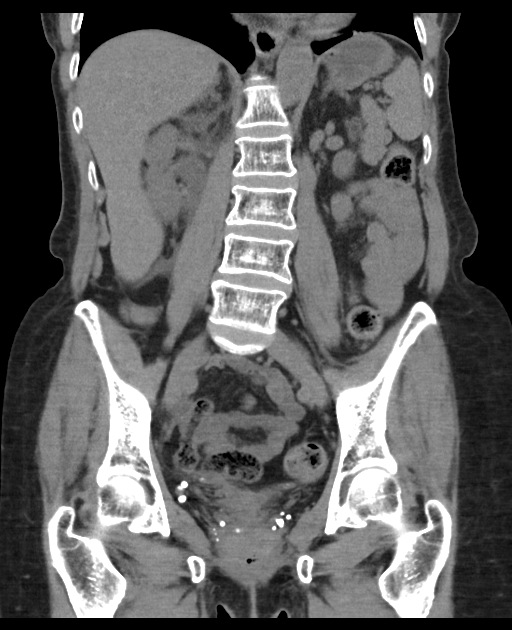

[16 of 46 positions shown; findings below may reference images not displayed]

FINDINGS: Lower chest: Bibasilar atelectasis

Hepatobiliary: Cyst RIGHT lobe liver 13 x 11 mm image 32 unchanged.
Liver otherwise unremarkable. Gallbladder surgically absent.

Pancreas: Normal appearance

Spleen: Normal appearance

Adrenals/Urinary Tract: Adrenal glands normal appearance. Mildly
hyperdense nodules within RIGHT kidney again identified likely
hyperdense cysts, largest posterior mid kidney 9 mm diameter
unchanged. New RIGHT hydronephrosis and hydroureter secondary to a 5
mm distal RIGHT ureteral calculus image 63. No renal calculi, LEFT
hydronephrosis or LEFT hydroureter. Bladder decompressed.

Stomach/Bowel: Minimal distal colonic diverticulosis without
evidence of diverticulitis. Normal appendix. Stomach and bowel loops
otherwise normal appearance.

Vascular/Lymphatic: Numerous pelvic phleboliths. Aorta normal
caliber. Minimal atherosclerotic calcification aorta. No adenopathy.

Reproductive: Uterus surgically absent. Nonvisualization of ovaries.

Other: No free air or free fluid. Tiny umbilical hernia containing
fat.

Musculoskeletal: Bones demineralized
IMPRESSION: RIGHT hydronephrosis and hydroureter secondary to a 5 mm distal
RIGHT ureteral calculus.

Minimal distal colonic diverticulosis without evidence of
diverticulitis.

No other significant intra-abdominal or intrapelvic abnormalities.

## 2019-05-31 ENCOUNTER — Encounter: Payer: Self-pay | Admitting: Family Medicine

## 2019-05-31 ENCOUNTER — Ambulatory Visit (INDEPENDENT_AMBULATORY_CARE_PROVIDER_SITE_OTHER): Payer: Medicare Other | Admitting: Family Medicine

## 2019-05-31 ENCOUNTER — Other Ambulatory Visit: Payer: Self-pay | Admitting: Family Medicine

## 2019-05-31 ENCOUNTER — Other Ambulatory Visit: Payer: Self-pay

## 2019-05-31 VITALS — BP 124/84 | HR 78 | Temp 98.6°F | Resp 18 | Wt 125.0 lb

## 2019-05-31 DIAGNOSIS — R197 Diarrhea, unspecified: Secondary | ICD-10-CM | POA: Diagnosis not present

## 2019-05-31 DIAGNOSIS — I951 Orthostatic hypotension: Secondary | ICD-10-CM | POA: Diagnosis not present

## 2019-05-31 DIAGNOSIS — E559 Vitamin D deficiency, unspecified: Secondary | ICD-10-CM

## 2019-05-31 DIAGNOSIS — R739 Hyperglycemia, unspecified: Secondary | ICD-10-CM | POA: Diagnosis not present

## 2019-05-31 DIAGNOSIS — M8580 Other specified disorders of bone density and structure, unspecified site: Secondary | ICD-10-CM

## 2019-05-31 DIAGNOSIS — K861 Other chronic pancreatitis: Secondary | ICD-10-CM

## 2019-05-31 DIAGNOSIS — T7840XD Allergy, unspecified, subsequent encounter: Secondary | ICD-10-CM

## 2019-05-31 DIAGNOSIS — F419 Anxiety disorder, unspecified: Secondary | ICD-10-CM

## 2019-05-31 DIAGNOSIS — F411 Generalized anxiety disorder: Secondary | ICD-10-CM

## 2019-05-31 LAB — HM MAMMOGRAPHY

## 2019-05-31 MED ORDER — ESCITALOPRAM OXALATE 10 MG PO TABS: 10.0000 mg | ORAL_TABLET | Freq: Every day | ORAL | 3 refills | Status: DC

## 2019-05-31 MED ORDER — ESTRADIOL 0.025 MG/24HR TD PTTW: MEDICATED_PATCH | TRANSDERMAL | 11 refills | Status: DC

## 2019-05-31 NOTE — Assessment & Plan Note (Signed)
Supplement and monitor 

## 2019-05-31 NOTE — Assessment & Plan Note (Signed)
Encouraged to get adequate exercise, calcium and vitamin d intake 

## 2019-05-31 NOTE — Patient Instructions (Addendum)
Pulse oximeter want oxygen in the 90s  Multivitamin with minerals, Selenium Vitamin D 05-1998 IU daily Aspirin 81 mg daily   Melatonin 1-5 mg at bedtime  Recommend calcium intake of 1200 to 1500 mg daily, divided into roughly 3 doses. Best source is the diet and a single dairy serving is about 500 mg, a supplement of calcium citrate once or twice daily to balance diet is fine if not getting enough in diet. Also need Vitamin D 2000 IU caps, 1 cap daily if not already taking vitamin D. Also recommend weight baring exercise on hips and upper body to keep bones strong.  Try starting a calcium citrate tablet, gummy etc daily, Citracal is a good brand Diarrhea, Adult Diarrhea is frequent loose and watery bowel movements. Diarrhea can make you feel weak and cause you to become dehydrated. Dehydration can make you tired and thirsty, cause you to have a dry mouth, and decrease how often you urinate. Diarrhea typically lasts 2-3 days. However, it can last longer if it is a sign of something more serious. It is important to treat your diarrhea as told by your health care provider. Follow these instructions at home: Eating and drinking     Follow these recommendations as told by your health care provider:  Take an oral rehydration solution (ORS). This is an over-the-counter medicine that helps return your body to its normal balance of nutrients and water. It is found at pharmacies and retail stores.  Drink plenty of fluids, such as water, ice chips, diluted fruit juice, and low-calorie sports drinks. You can drink milk also, if desired.  Avoid drinking fluids that contain a lot of sugar or caffeine, such as energy drinks, sports drinks, and soda.  Eat bland, easy-to-digest foods in small amounts as you are able. These foods include bananas, applesauce, rice, lean meats, toast, and crackers.  Avoid alcohol.  Avoid spicy or fatty foods.  Medicines  Take over-the-counter and prescription medicines  only as told by your health care provider.  If you were prescribed an antibiotic medicine, take it as told by your health care provider. Do not stop using the antibiotic even if you start to feel better. General instructions   Wash your hands often using soap and water. If soap and water are not available, use a hand sanitizer. Others in the household should wash their hands as well. Hands should be washed: ? After using the toilet or changing a diaper. ? Before preparing, cooking, or serving food. ? While caring for a sick person or while visiting someone in a hospital.  Drink enough fluid to keep your urine pale yellow.  Rest at home while you recover.  Watch your condition for any changes.  Take a warm bath to relieve any burning or pain from frequent diarrhea episodes.  Keep all follow-up visits as told by your health care provider. This is important. Contact a health care provider if:  You have a fever.  Your diarrhea gets worse.  You have new symptoms.  You cannot keep fluids down.  You feel light-headed or dizzy.  You have a headache.  You have muscle cramps. Get help right away if:  You have chest pain.  You feel extremely weak or you faint.  You have bloody or black stools or stools that look like tar.  You have severe pain, cramping, or bloating in your abdomen.  You have trouble breathing or you are breathing very quickly.  Your heart is beating very quickly.  Your skin feels cold and clammy.  You feel confused.  You have signs of dehydration, such as: ? Dark urine, very little urine, or no urine. ? Cracked lips. ? Dry mouth. ? Sunken eyes. ? Sleepiness. ? Weakness. Summary  Diarrhea is frequent loose and watery bowel movements. Diarrhea can make you feel weak and cause you to become dehydrated.  Drink enough fluids to keep your urine pale yellow.  Make sure that you wash your hands after using the toilet. If soap and water are not  available, use hand sanitizer.  Contact a health care provider if your diarrhea gets worse or you have new symptoms.  Get help right away if you have signs of dehydration. This information is not intended to replace advice given to you by your health care provider. Make sure you discuss any questions you have with your health care provider. Document Revised: 09/29/2018 Document Reviewed: 10/17/2017 Elsevier Patient Education  [place].  Kegel Exercises  Kegel exercises can help strengthen your pelvic floor muscles. The pelvic floor is a group of muscles that support your rectum, small intestine, and bladder. In females, pelvic floor muscles also help support the womb (uterus). These muscles help you control the flow of urine and stool. Kegel exercises are painless and simple, and they do not require any equipment. Your provider may suggest Kegel exercises to:  Improve bladder and bowel control.  Improve sexual response.  Improve weak pelvic floor muscles after surgery to remove the uterus (hysterectomy) or pregnancy (females).  Improve weak pelvic floor muscles after prostate gland removal or surgery (males). Kegel exercises involve squeezing your pelvic floor muscles, which are the same muscles you squeeze when you try to stop the flow of urine or keep from passing gas. The exercises can be done while sitting, standing, or lying down, but it is best to vary your position. Exercises How to do Kegel exercises: 1. Squeeze your pelvic floor muscles tight. You should feel a tight lift in your rectal area. If you are a female, you should also feel a tightness in your vaginal area. Keep your stomach, buttocks, and legs relaxed. 2. Hold the muscles tight for up to 10 seconds. 3. Breathe normally. 4. Relax your muscles. 5. Repeat as told by your health care provider. Repeat this exercise daily as told by your health care provider. Continue to do this exercise for at least 4-6 weeks, or  for as long as told by your health care provider. You may be referred to a physical therapist who can help you learn more about how to do Kegel exercises. Depending on your condition, your health care provider may recommend:  Varying how long you squeeze your muscles.  Doing several sets of exercises every day.  Doing exercises for several weeks.  Making Kegel exercises a part of your regular exercise routine. This information is not intended to replace advice given to you by your health care provider. Make sure you discuss any questions you have with your health care provider. Document Revised: 12/31/2017 Document Reviewed: 12/31/2017 Elsevier Patient Education  [place].

## 2019-06-01 LAB — COMPREHENSIVE METABOLIC PANEL
ALT: 15 U/L (ref 0–35)
AST: 17 U/L (ref 0–37)
Albumin: 4 g/dL (ref 3.5–5.2)
Alkaline Phosphatase: 60 U/L (ref 39–117)
BUN: 17 mg/dL (ref 6–23)
CO2: 31 mEq/L (ref 19–32)
Calcium: 9.2 mg/dL (ref 8.4–10.5)
Chloride: 102 mEq/L (ref 96–112)
Creatinine, Ser: 1 mg/dL (ref 0.40–1.20)
GFR: 53.44 mL/min — ABNORMAL LOW (ref >60.00)
Glucose, Bld: 94 mg/dL (ref 70–99)
Potassium: 4.1 mEq/L (ref 3.5–5.1)
Sodium: 140 mEq/L (ref 135–145)
Total Bilirubin: 0.3 mg/dL (ref 0.2–1.2)
Total Protein: 6.1 g/dL (ref 6.0–8.3)

## 2019-06-01 LAB — CBC
HCT: 38.8 % (ref 36.0–46.0)
Hemoglobin: 12.9 g/dL (ref 12.0–15.0)
MCHC: 33.4 g/dL (ref 30.0–36.0)
MCV: 92.9 fl (ref 78.0–100.0)
Platelets: 275 10*3/uL (ref 150.0–400.0)
RBC: 4.17 Mil/uL (ref 3.87–5.11)
RDW: 13.5 % (ref 11.5–15.5)
WBC: 8.3 10*3/uL (ref 4.0–10.5)

## 2019-06-01 LAB — AMYLASE: Amylase: 85 U/L (ref 27–131)

## 2019-06-01 LAB — VITAMIN D 25 HYDROXY (VIT D DEFICIENCY, FRACTURES): VITD: 43.85 ng/mL (ref 30.00–100.00)

## 2019-06-01 LAB — LIPASE: Lipase: 73 U/L — ABNORMAL HIGH (ref 11.0–59.0)

## 2019-06-01 LAB — HEMOGLOBIN A1C: Hgb A1c MFr Bld: 5.7 % (ref 4.6–6.5)

## 2019-06-01 LAB — TSH: TSH: 2.44 u[IU]/mL (ref 0.35–4.50)

## 2019-06-02 NOTE — Progress Notes (Signed)
Subjective:    Patient ID: [name], female    DOB: [birth year], 79 y.o.   MRN: [id number]  No chief complaint on file.   HPI Patient is in today for follow up on chronic medical concerns including autoimmune pancreatitis, allergies, anxiety and more. She is doing well today. No recent febrile illness or hospitalizations. She has had several episodes of explosive diarrhea but no bloody or tarry stool. No fevers or chills. Denies CP/palp/SOB/HA/congestion/fevers or GU c/o. Taking meds as prescribed. She is maintaining quarantine.  Past Medical History:  Diagnosis Date  . Allergic state   . Allergy   . ANXIETY, CHRONIC   . 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([place])   . CHEST PAIN    LHC 02/2011: normal cors, EF 65%  . Contact dermatitis 10/13/2017  . Diverticulosis   . Esophageal reflux   . Fibrocystic breast   . Hand injury, left, initial encounter 10/13/2017  . Hepatic cyst   . HYPOTENSION, ORTHOSTATIC   . IBS (irritable bowel syndrome)   . Internal hemorrhoids without mention of complication   . Kidney lesion   . Kidney stone   . Liver cyst   . Osteoarthritis   . Osteopenia   . Osteopenia   . Osteoporosis   . Pain in ear   . PALPITATIONS, RECURRENT   . Pancreatitis chronic   . Preventative health care   . PSVT   . 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 SHOULDER PAIN   . TOBACCO USE, QUIT   . VITAMIN D DEFICIENCY     Past Surgical History:  Procedure Laterality Date  . ABDOMINAL HYSTERECTOMY  1998  . BREAST SURGERY     breast bx on left, benign  . CARDIAC CATHETERIZATION    . CATARACT EXTRACTION, BILATERAL    . CATARACT EXTRACTION, BILATERAL    . CHOLECYSTECTOMY  1999  . COLONOSCOPY    . EYE SURGERY Bilateral 04/2017, 05/2017   cataracts  . [place]  . TUBAL LIGATION     with D&C    Family History  Problem Relation Age of Onset  . Hypertension Mother   . Stroke Mother   . Alcohol abuse Mother   . Heart disease Father   . Bladder  Cancer Father   . Diabetes Father   . Hypothyroidism Father   . Dementia Father   . Uterine cancer Paternal Grandmother   . Heart disease Sister        rheumatic fever at 6 months, cardiomegaly MI  . Heart disease Maternal Grandmother   . Stroke Maternal Grandmother   . Heart disease Maternal Grandfather   . Stroke Maternal Grandfather   . Breast cancer Cousin   . Pancreatic cancer Paternal Aunt   . Stomach cancer Paternal Uncle   . Breast cancer Paternal Aunt   . Lung cancer Paternal Uncle   . Crohn's disease Son   . Colon cancer Neg Hx   . Colon polyps Neg Hx   . Esophageal cancer Neg Hx   . Rectal cancer Neg Hx     Social History   Socioeconomic History  . Marital status: Married    Spouse name: Not on file  . Number of children: 1  . Years of education: Not on file  . Highest education level: Not on file  Occupational History  . Occupation: Retired    Fish farm manager: RETIRED  Tobacco Use  . Smoking status: Former Smoker    Types: Cigarettes    Quit  date: 05/27/1973    Years since quitting: 46.0  . Smokeless tobacco: Never Used  Substance and Sexual Activity  . Alcohol use: No  . Drug use: No  . Sexual activity: Not on file  Other Topics Concern  . Not on file  Social History Narrative  . Not on file   Social Determinants of Health   Financial Resource Strain:   . Difficulty of Paying Living Expenses: Not on file  Food Insecurity:   . Worried About Charity fundraiser in the Last Year: Not on file  . Ran Out of Food in the Last Year: Not on file  Transportation Needs:   . Lack of Transportation (Medical): Not on file  . Lack of Transportation (Non-Medical): Not on file  Physical Activity:   . Days of Exercise per Week: Not on file  . Minutes of Exercise per Session: Not on file  Stress:   . Feeling of Stress : Not on file  Social Connections:   . Frequency of Communication with Friends and Family: Not on file  . Frequency of Social Gatherings with Friends  and Family: Not on file  . Attends Religious Services: Not on file  . Active Member of Clubs or Organizations: Not on file  . Attends Archivist Meetings: Not on file  . Marital Status: Not on file  Intimate Partner Violence:   . Fear of Current or Ex-Partner: Not on file  . Emotionally Abused: Not on file  . Physically Abused: Not on file  . Sexually Abused: Not on file    Outpatient Medications Prior to Visit  Medication Sig Dispense Refill  . Ascorbic Acid (VITAMIN C) 500 MG tablet Take 500 mg by mouth daily.      Marland Kitchen b complex vitamins tablet Take 1 tablet by mouth daily.    . cetirizine (ZYRTEC) 10 MG tablet Take 10 mg by mouth daily.      . Cholecalciferol (VITAMIN D3) 1000 UNITS CAPS Take 1 tablet by mouth daily.     . Cranberry 500 MG CAPS Take 1 capsule by mouth daily.    . diphenoxylate-atropine (LOMOTIL) 2.5-0.025 MG tablet Take 1 tablet by mouth 4 (four) times daily as needed for diarrhea or loose stools. 360 tablet 2  . fluticasone (FLONASE) 50 MCG/ACT nasal spray Place 2 sprays into both nostrils as needed for rhinitis or allergies. 16 g 0  . metroNIDAZOLE (METROCREAM) 0.75 % cream Apply 1 application topically 2 (two) times daily.     . Pancrelipase, Lip-Prot-Amyl, (CREON) 24000-76000 units CPEP TAKE 6 CAPSULES BY MOUTH  WITH EACH MEAL AND 1 TO 2  CAPSULES WITH EACH SNACK. 2100 capsule 1  . pantoprazole (PROTONIX) 40 MG tablet TAKE 1 TABLET BY MOUTH  DAILY 90 tablet 3  . Probiotic Product (PROBIOTIC PO) Take by mouth. Takes Probaclac probiotic once daily     . tiZANidine (ZANAFLEX) 4 MG capsule Take 0.5-1 tablet by mouth twice daily as needed for back spasm 20 capsule 0  . triamcinolone cream (KENALOG) 0.1 % Apply 1 application topically 2 (two) times daily. 80 g 01  . TURMERIC PO Take by mouth daily.    Marland Kitchen escitalopram (LEXAPRO) 10 MG tablet TAKE 1 TABLET BY MOUTH  DAILY 90 tablet 3  . estradiol (VIVELLE-DOT) 0.025 MG/24HR Apply 1 patch topically to skin twice  weekly. 24 patch 11   No facility-administered medications prior to visit.    Allergies  Allergen Reactions  . Amoxicillin  REACTION: unspecified  . Azithromycin     REACTION: Rash  . Ciprofloxacin     REACTION: GI upset  . Epinephrine     REACTION: unspecified  . Penicillins     REACTION: rash  . Sulfonamide Derivatives     REACTION: sick to stomach  . Tamiflu [Oseltamivir Phosphate] Hives    Review of Systems  Constitutional: Negative for fever and malaise/fatigue.  HENT: Negative for congestion.   Eyes: Negative for blurred vision.  Respiratory: Negative for shortness of breath.   Cardiovascular: Negative for chest pain, palpitations and leg swelling.  Gastrointestinal: Negative for abdominal pain, blood in stool and nausea.  Genitourinary: Negative for dysuria and frequency.  Musculoskeletal: Negative for falls.  Skin: Negative for rash.  Neurological: Negative for dizziness, loss of consciousness and headaches.  Endo/Heme/Allergies: Negative for environmental allergies.  Psychiatric/Behavioral: Negative for depression. The patient is not nervous/anxious.        Objective:    Physical Exam Vitals and nursing note reviewed.  Constitutional:      General: She is not in acute distress.    Appearance: She is well-developed.  HENT:     Head: Normocephalic and atraumatic.     Nose: Nose normal.  Eyes:     General:        Right eye: No discharge.        Left eye: No discharge.  Cardiovascular:     Rate and Rhythm: Normal rate and regular rhythm.     Heart sounds: No murmur.  Pulmonary:     Effort: Pulmonary effort is normal.     Breath sounds: Normal breath sounds.  Abdominal:     General: Bowel sounds are normal.     Palpations: Abdomen is soft.     Tenderness: There is no abdominal tenderness.  Musculoskeletal:     Cervical back: Normal range of motion and neck supple.  Skin:    General: Skin is warm and dry.  Neurological:     Mental Status: She  is alert and oriented to person, place, and time.     BP 124/84 (BP Location: Left Arm, Patient Position: Sitting, Cuff Size: Normal)   Pulse 78   Temp 98.6 F (37 C) (Temporal)   Resp 18   Wt 125 lb (56.7 kg)   SpO2 97%   BMI 20.48 kg/m  Wt Readings from Last 3 Encounters:  05/31/19 125 lb (56.7 kg)  11/20/18 121 lb 12.8 oz (55.2 kg)  10/01/18 123 lb (55.8 kg)    Diabetic Foot Exam - Simple   No data filed     Lab Results  Component Value Date   WBC 8.3 05/31/2019   HGB 12.9 05/31/2019   HCT 38.8 05/31/2019   PLT 275.0 05/31/2019   GLUCOSE 94 05/31/2019   CHOL 146 09/12/2016   TRIG 26.0 09/12/2016   HDL 102.30 09/12/2016   LDLCALC 38 09/12/2016   ALT 15 05/31/2019   AST 17 05/31/2019   NA 140 05/31/2019   K 4.1 05/31/2019   CL 102 05/31/2019   CREATININE 1.00 05/31/2019   BUN 17 05/31/2019   CO2 31 05/31/2019   TSH 2.44 05/31/2019   INR 1.00 03/22/2011   HGBA1C 5.7 05/31/2019    Lab Results  Component Value Date   TSH 2.44 05/31/2019   Lab Results  Component Value Date   WBC 8.3 05/31/2019   HGB 12.9 05/31/2019   HCT 38.8 05/31/2019   MCV 92.9 05/31/2019   PLT 275.0 05/31/2019  Lab Results  Component Value Date   NA 140 05/31/2019   K 4.1 05/31/2019   CO2 31 05/31/2019   GLUCOSE 94 05/31/2019   BUN 17 05/31/2019   CREATININE 1.00 05/31/2019   BILITOT 0.3 05/31/2019   ALKPHOS 60 05/31/2019   AST 17 05/31/2019   ALT 15 05/31/2019   PROT 6.1 05/31/2019   ALBUMIN 4.0 05/31/2019   CALCIUM 9.2 05/31/2019   ANIONGAP 11 03/20/2018   GFR 53.44 (L) 05/31/2019   Lab Results  Component Value Date   CHOL 146 09/12/2016   Lab Results  Component Value Date   HDL 102.30 09/12/2016   Lab Results  Component Value Date   LDLCALC 38 09/12/2016   Lab Results  Component Value Date   TRIG 26.0 09/12/2016   Lab Results  Component Value Date   CHOLHDL 1 09/12/2016   Lab Results  Component Value Date   HGBA1C 5.7 05/31/2019         Assessment & Plan:   Problem List Items Addressed This Visit    Vitamin D deficiency    Supplement and monitor      Relevant Orders   CMP (Completed)   VITAMIN D (Completed)   Anxiety state    Is maintaining quarantine and is managing the stress well all things considered. No changes to therapy      Relevant Medications   escitalopram (LEXAPRO) 10 MG tablet   HYPOTENSION, ORTHOSTATIC - Primary   Relevant Orders   CBC (Completed)   TSH (Completed)   Autoimmune pancreatitis ([place])    She has had several episodes of explosive diarrhea lately and her lipase is slightly elevated over last reading on check today. She will continue on her enzyme supplements and low fat diet and follow up with gastroenterology with whom she has an appointment soon.       Allergy    No recent flare but she has had bad reactions with hives and requiring epinephrine in the past.       Osteopenia    Encouraged to get adequate exercise, calcium and vitamin d intake      Relevant Orders   CBC (Completed)   TSH (Completed)    Other Visit Diagnoses    Anxiety       Relevant Medications   escitalopram (LEXAPRO) 10 MG tablet   Other Relevant Orders   TSH (Completed)   Hyperglycemia       Relevant Orders   A1C (Completed)   TSH (Completed)   Diarrhea, unspecified type       Relevant Orders   Amylase (Completed)   Lipase (Completed)      I have changed Clinton J. Kaltenbach's escitalopram. I am also having her maintain her vitamin C, Vitamin D3, cetirizine, Probiotic Product (PROBIOTIC PO), Cranberry, TURMERIC PO, metroNIDAZOLE, fluticasone, b complex vitamins, diphenoxylate-atropine, Pancrelipase (Lip-Prot-Amyl), triamcinolone cream, tiZANidine, pantoprazole, and estradiol.  Meds ordered this encounter  Medications  . estradiol (VIVELLE-DOT) 0.025 MG/24HR    Sig: Apply 1 patch topically to skin twice weekly.    Dispense:  24 patch    Refill:  11  . escitalopram (LEXAPRO) 10 MG tablet    Sig: Take  1 tablet (10 mg total) by mouth daily.    Dispense:  90 tablet    Refill:  3     Penni Homans, MD

## 2019-06-02 NOTE — Assessment & Plan Note (Signed)
No recent flare but she has had bad reactions with hives and requiring epinephrine in the past.

## 2019-06-02 NOTE — Assessment & Plan Note (Signed)
She has had several episodes of explosive diarrhea lately and her lipase is slightly elevated over last reading on check today. She will continue on her enzyme supplements and low fat diet and follow up with gastroenterology with whom she has an appointment soon.

## 2019-06-02 NOTE — Assessment & Plan Note (Signed)
Is maintaining quarantine and is managing the stress well all things considered. No changes to therapy

## 2019-06-04 ENCOUNTER — Encounter: Payer: Self-pay | Admitting: Family Medicine

## 2019-06-07 ENCOUNTER — Encounter: Payer: Self-pay | Admitting: *Deleted

## 2019-06-16 ENCOUNTER — Encounter: Payer: Self-pay | Admitting: Internal Medicine

## 2019-06-16 ENCOUNTER — Ambulatory Visit (INDEPENDENT_AMBULATORY_CARE_PROVIDER_SITE_OTHER): Payer: Medicare Other | Admitting: Internal Medicine

## 2019-06-16 VITALS — BP 110/68 | HR 90 | Ht 64.17 in | Wt 122.0 lb

## 2019-06-16 DIAGNOSIS — K861 Other chronic pancreatitis: Secondary | ICD-10-CM

## 2019-06-16 DIAGNOSIS — K8689 Other specified diseases of pancreas: Secondary | ICD-10-CM | POA: Diagnosis not present

## 2019-06-16 DIAGNOSIS — K219 Gastro-esophageal reflux disease without esophagitis: Secondary | ICD-10-CM | POA: Diagnosis not present

## 2019-06-16 DIAGNOSIS — K580 Irritable bowel syndrome with diarrhea: Secondary | ICD-10-CM | POA: Diagnosis not present

## 2019-06-16 DIAGNOSIS — Z23 Encounter for immunization: Secondary | ICD-10-CM

## 2019-06-16 MED ORDER — CREON 24000-76000 UNITS PO CPEP: ORAL_CAPSULE | ORAL | 1 refills | Status: DC

## 2019-06-16 MED ORDER — DIPHENOXYLATE-ATROPINE 2.5-0.025 MG PO TABS: 1.0000 | ORAL_TABLET | Freq: Four times a day (QID) | ORAL | 1 refills | Status: DC | PRN

## 2019-06-16 MED ORDER — PANTOPRAZOLE SODIUM 40 MG PO TBEC: 40.0000 mg | DELAYED_RELEASE_TABLET | Freq: Every day | ORAL | 1 refills | Status: DC

## 2019-06-16 NOTE — Progress Notes (Signed)
Subjective:    Patient ID: [name], female    DOB: 10-13-1940, 79 y.o.   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  She is here in person today and was last seen on 07/22/2018.  She reports for the most part she has been doing well.  She has had issues with episodic explosive loose stools.  Stools are very urgent and has led to multiple accidents.  She is wearing depends and taking a change of clothes with her when she eats out.  Her explosive loose stools and diarrhea seem to be worse when she eats out which she normally does on Friday and Saturday.  For example when she eats at [name] she will have a urgent stooling which is often uncontrollable.  She is using Lomotil but normally not prior to eating more after loose stool.  She continues Creon 6 capsules with meals.  She also continues pantoprazole 40 mg a day without reflux symptoms as long as she is taking the medicine patient.  No real upper abdominal pain recently though when seen by primary care her lipase was up slightly at 73.  Amylase was normal.  CBC was normal.  We treated her with rifaximin last year for 14 days for IBS with loose stool and she had no change in her loose stools.  She has been having issue with hypotension and is working with primary care on this issue.  She is contemplating a COVID-19 vaccine  Review of Systems As per HPI, otherwise negative  Current Medications, Allergies, Past Medical History, Past Surgical History, Family History and Social History were reviewed in [employer] record.     Objective:   Physical Exam BP 110/68   Pulse 90   Ht 5' 4.17" (1.63 m)   Wt 122 lb (55.3 kg)   BMI 20.83 kg/m  Gen: awake, alert, NAD HEENT:  anicteric CV: RRR, no mrg Pulm: CTA b/l Abd: soft, NT/ND, +BS throughout Ext: no c/c/e Neuro: nonfocal  CBC    Component Value Date/Time   WBC 8.3 05/31/2019 1619   RBC 4.17 05/31/2019 1619   HGB 12.9 05/31/2019 1619   HGB 13.1 02/13/2017 1136   HCT 38.8 05/31/2019 1619   HCT 38.1 02/13/2017 1136   PLT 275.0 05/31/2019 1619   PLT 255 02/13/2017 1136   MCV 92.9 05/31/2019 1619   MCV 89 02/13/2017 1136   MCH 30.2 03/20/2018 1403   MCHC 33.4 05/31/2019 1619   RDW 13.5 05/31/2019 1619   RDW 13.4 02/13/2017 1136   LYMPHSABS 1.5 11/20/2018 1144   MONOABS 0.5 11/20/2018 1144   EOSABS 0.1 11/20/2018 1144   BASOSABS 0.0 11/20/2018 1144   CMP     Component Value Date/Time   NA 140 05/31/2019 1619   NA 143 02/13/2017 1136   K 4.1 05/31/2019 1619   CL 102 05/31/2019 1619   CO2 31 05/31/2019 1619   GLUCOSE 94 05/31/2019 1619   BUN 17 05/31/2019 1619   BUN 14 02/13/2017 1136   CREATININE 1.00 05/31/2019 1619   CREATININE 0.92 03/26/2013 0931   CALCIUM 9.2 05/31/2019 1619   PROT 6.1 05/31/2019 1619   ALBUMIN 4.0 05/31/2019 1619   AST 17 05/31/2019 1619   ALT 15 05/31/2019 1619   ALKPHOS 60 05/31/2019 1619  BILITOT 0.3 05/31/2019 1619   GFRNONAA 51 (L) 03/20/2018 1403   GFRAA 59 (L) 03/20/2018 1403   Lipase     Component Value Date/Time   LIPASE 73.0 (H) 05/31/2019 1619       Assessment & Plan:  79 year old female with a history of 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  1.  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  She did not respond to rifaximin early last year.  Her lipase is up slightly and has been several  years since we had cross-sectional imaging dedicated to the pancreas.  I recommended the following --Increase Creon to 8 tablets with meals that are high in fat such as when she eats out or fast food, otherwise she can stick with 6 tablets with meals. --Use Lomotil prior to high-fat meals rather than after the diarrhea starts, 1 to 2 tablets 4 times daily the RN --MRI with MRCP pancreas protocol  2.  IBS with loose stool --some part of her loose stools may be irritable bowel related but she did not improve with rifaximin.  See #1  3.  GERD/dyspepsia --doing well without alarm symptoms, continue pantoprazole 40 mg a day  4.  History of adenomatous polyps --surveillance colonoscopy due in 2022  5.  COVID-19 vaccine --I strongly encouraged her to proceed with COVID-19 vaccination and gave her the number for [name] to schedule this.  We discussed this at length today  6 to 76-month follow-up, sooner if needed  30 minutes total spent today including patient facing time, coordination of care, reviewing medical history/procedures/pertinent radiology studies, and documentation of the encounter.

## 2019-06-16 NOTE — Patient Instructions (Addendum)
If you are age 80 or older, your body mass index should be between 23-30. Your Body mass index is 20.83 kg/m. If this is out of the aforementioned range listed, please consider follow up with your Primary Care Provider.  If you are age 40 or younger, your body mass index should be between 19-25. Your Body mass index is 20.83 kg/m. If this is out of the aformentioned range listed, please consider follow up with your Primary Care Provider.   You have been scheduled for an MRI at [facility] on 06/28/2019. Your appointment time is 9:00am. Please arrive 30 minutes prior to your appointment time for registration purposes. Please make certain not to have anything to eat or drink 4 hours prior to your test. In addition, if you have any metal in your body, have a pacemaker or defibrillator, please be sure to let your ordering physician know. This test typically takes 45 minutes to 1 hour to complete. Should you need to reschedule, please call [phone] to do so.  Please increase Creon to 8 capsules when eating out or when eating higher fat foods.   You will need a follow up appointment in 6 - 12 months.  Please contact our office for an appointment.  Due to recent changes in healthcare laws, you may see the results of your imaging and laboratory studies on MyChart before your provider has had a chance to review them.  We understand that in some cases there may be results that are confusing or concerning to you. Not all laboratory results come back in the same time frame and the provider may be waiting for multiple results in order to interpret others.  Please give [place] 48 hours in order for your provider to thoroughly review all the results before contacting the office for clarification of your results.

## 2019-06-18 ENCOUNTER — Telehealth: Payer: Self-pay | Admitting: Family Medicine

## 2019-06-18 NOTE — Telephone Encounter (Signed)
Patient would like hormonal patches (Vivelle) and insurance will not cover with pre authorization from MD.  [facility]

## 2019-06-18 NOTE — Telephone Encounter (Signed)
Pt would like her Hormoneal Patches

## 2019-06-28 ENCOUNTER — Ambulatory Visit (HOSPITAL_COMMUNITY)
Admission: RE | Admit: 2019-06-28 | Discharge: 2019-06-28 | Disposition: A | Discharge status: Home / Self Care | Payer: Medicare Other | Source: Ambulatory Visit | Attending: Internal Medicine | Admitting: Internal Medicine

## 2019-06-28 ENCOUNTER — Other Ambulatory Visit: Payer: Self-pay

## 2019-06-28 ENCOUNTER — Other Ambulatory Visit: Payer: Self-pay | Admitting: Internal Medicine

## 2019-06-28 DIAGNOSIS — K219 Gastro-esophageal reflux disease without esophagitis: Secondary | ICD-10-CM | POA: Insufficient documentation

## 2019-06-28 DIAGNOSIS — K580 Irritable bowel syndrome with diarrhea: Secondary | ICD-10-CM | POA: Diagnosis present

## 2019-06-28 DIAGNOSIS — K861 Other chronic pancreatitis: Secondary | ICD-10-CM

## 2019-06-28 MED ORDER — GADOBUTROL 1 MMOL/ML IV SOLN
6.0000 mL | Freq: Once | INTRAVENOUS | Status: AC | PRN
  Administered 2019-06-28: 6 mL via INTRAVENOUS

## 2019-06-30 ENCOUNTER — Telehealth: Payer: Self-pay | Admitting: Internal Medicine

## 2019-06-30 ENCOUNTER — Telehealth: Payer: Self-pay

## 2019-06-30 NOTE — Telephone Encounter (Signed)
Caller Name: Marjory Lies with [employer]/Insurance Phone: [phone] provider line  Prior Josem Kaufmann is required and has not been submitted. Pt has contacted [employer] in regards and they are calling on her behalf.    After auth received pt would like RX sent to: Medication: estradiol (VIVELLE-DOT) 0.025 MG/24HR  Preferred Pharmacy (with phone number or street name):  [place], [place] [employer] Phone:  [phone]  Fax:  [phone]

## 2019-06-30 NOTE — Telephone Encounter (Addendum)
PA approved. However, plan only overs 8 patches per month.   Request Reference Number: [id number]. ESTRADIOL DIS 0.025MG  is approved through 05/26/2020. Your patient may now fill this prescription and it will be covered.

## 2019-06-30 NOTE — Telephone Encounter (Signed)
Vivelle-DOT was refilled to [facility] on 05/31/2019. Will initiate PA. #24 patch and 11 refills.   PA initiated via Covermymeds; KEY: [id number]. Awaiting determination.

## 2019-06-30 NOTE — Telephone Encounter (Signed)
Please resend the prescription back to the pharmacy with detailed instructions. Also the pharmacy stated that its only 8 patch's for 30 days,        estradiol (VIVELLE-DOT) 0.025 MG/24HR 24 patch 11 05/31/2019    Sig: Apply 1 patch topically to skin twice weekly.   Sent to pharmacy as: estradiol (VIVELLE-DOT) 0.025 MG/24HR   E-Prescribing Status: Receipt confirmed by pharmacy (05/31/2019 3:44 PM EST)      Please follow  up with the  Pharmacy tech Caryl Pina from [employer]  Prior  authorization department at [phone]   Thanks,

## 2019-07-01 NOTE — Telephone Encounter (Signed)
[name] Key: [id number] - PA Case ID: [id number] Need help? Call us at [phone] Outcome Approvedtoday Request Reference Number: [id number]. DIPHEN/ATROP TAB 2.5MG  is approved through 05/26/2020. Your patient may now fill this prescription and it will be covered. Drug Diphenoxylate-Atropine 2.5-0.025MG  tablets  I have left message advising patient of approval.

## 2019-07-01 NOTE — Telephone Encounter (Signed)
Prior authorization request sent through [email or web address] for diphenoxylate. This was sent to [facility] as per covermymeds. We will await response.

## 2019-07-02 MED ORDER — ESTRADIOL 0.025 MG/24HR TD PTTW: MEDICATED_PATCH | TRANSDERMAL | 11 refills | Status: DC

## 2019-07-02 NOTE — Telephone Encounter (Signed)
Note   PA approved.    Request Reference Number: [id number]. ESTRADIOL DIS 0.025MG  is approved through 05/26/2020. Your patient may now fill this prescription and it will be covered      See telephone note in chart for PA approval.

## 2019-07-02 NOTE — Telephone Encounter (Signed)
Please advise on PA

## 2019-07-05 MED ORDER — ESTRADIOL 0.025 MG/24HR TD PTTW: MEDICATED_PATCH | TRANSDERMAL | 11 refills | Status: DC

## 2019-07-05 NOTE — Addendum Note (Signed)
Addended by: Magdalene Molly A on: 07/05/2019 08:34 AM   Modules accepted: Orders

## 2019-07-11 ENCOUNTER — Ambulatory Visit: Payer: Medicare Other | Attending: Internal Medicine

## 2019-07-11 DIAGNOSIS — Z23 Encounter for immunization: Secondary | ICD-10-CM | POA: Insufficient documentation

## 2019-07-11 NOTE — Progress Notes (Signed)
   Covid-19 Vaccination Clinic  Name:  [name]    MRN: [id number] DOB: [birth year]  07/11/2019  [name] was observed post Covid-19 immunization for 15 minutes without incidence. She was provided with Vaccine Information Sheet and instruction to access the V-Safe system.   [name] was instructed to call 911 with any severe reactions post vaccine: Marland Kitchen Difficulty breathing  . Swelling of your face and throat  . A fast heartbeat  . A bad rash all over your body  . Dizziness and weakness    Immunizations Administered    Name Date Dose VIS Date Route   Pfizer COVID-19 Vaccine 07/11/2019  2:10 PM 0.3 mL 05/07/2019 Intramuscular   Manufacturer: [place]   Lot: [id number]   [place]: [id number]

## 2019-08-03 ENCOUNTER — Ambulatory Visit: Payer: Medicare Other | Attending: Internal Medicine

## 2019-08-03 DIAGNOSIS — Z23 Encounter for immunization: Secondary | ICD-10-CM | POA: Insufficient documentation

## 2019-08-03 NOTE — Progress Notes (Signed)
   Covid-19 Vaccination Clinic  Name:  [name]    MRN: [id number] DOB: [birth year]  08/03/2019  [name] was observed post Covid-19 immunization for 15 minutes without incident. She was provided with Vaccine Information Sheet and instruction to access the V-Safe system.   [name] was instructed to call 911 with any severe reactions post vaccine: Marland Kitchen Difficulty breathing  . Swelling of face and throat  . A fast heartbeat  . A bad rash all over body  . Dizziness and weakness   Immunizations Administered    Name Date Dose VIS Date Route   Pfizer COVID-19 Vaccine 08/03/2019 12:25 PM 0.3 mL 05/07/2019 Intramuscular   Manufacturer: [place]   Lot: [id number]   [place]: [id number]

## 2019-08-04 ENCOUNTER — Ambulatory Visit: Payer: Medicare Other

## 2019-09-02 ENCOUNTER — Other Ambulatory Visit: Payer: Self-pay

## 2019-09-02 MED ORDER — DIPHENOXYLATE-ATROPINE 2.5-0.025 MG PO TABS: 1.0000 | ORAL_TABLET | Freq: Four times a day (QID) | ORAL | 1 refills | Status: DC | PRN

## 2019-09-08 ENCOUNTER — Other Ambulatory Visit: Payer: Self-pay | Admitting: *Deleted

## 2019-09-08 MED ORDER — DIPHENOXYLATE-ATROPINE 2.5-0.025 MG PO TABS: 1.0000 | ORAL_TABLET | Freq: Four times a day (QID) | ORAL | 1 refills | Status: DC | PRN

## 2019-10-18 ENCOUNTER — Encounter: Payer: Medicare Other | Admitting: Family Medicine

## 2019-10-18 ENCOUNTER — Ambulatory Visit: Payer: Medicare Other | Admitting: *Deleted

## 2019-11-01 ENCOUNTER — Other Ambulatory Visit: Payer: Self-pay | Admitting: Internal Medicine

## 2019-11-30 ENCOUNTER — Encounter: Payer: Self-pay | Admitting: Family Medicine

## 2019-11-30 ENCOUNTER — Ambulatory Visit (INDEPENDENT_AMBULATORY_CARE_PROVIDER_SITE_OTHER): Payer: Medicare Other | Admitting: Family Medicine

## 2019-11-30 ENCOUNTER — Other Ambulatory Visit: Payer: Self-pay

## 2019-11-30 VITALS — BP 116/70 | HR 85 | Temp 97.9°F | Resp 12 | Ht 65.0 in | Wt 124.4 lb

## 2019-11-30 DIAGNOSIS — K219 Gastro-esophageal reflux disease without esophagitis: Secondary | ICD-10-CM | POA: Diagnosis not present

## 2019-11-30 DIAGNOSIS — E559 Vitamin D deficiency, unspecified: Secondary | ICD-10-CM | POA: Diagnosis not present

## 2019-11-30 DIAGNOSIS — K861 Other chronic pancreatitis: Secondary | ICD-10-CM | POA: Diagnosis not present

## 2019-11-30 DIAGNOSIS — R748 Abnormal levels of other serum enzymes: Secondary | ICD-10-CM

## 2019-11-30 DIAGNOSIS — K760 Fatty (change of) liver, not elsewhere classified: Secondary | ICD-10-CM | POA: Diagnosis not present

## 2019-11-30 DIAGNOSIS — R1013 Epigastric pain: Secondary | ICD-10-CM

## 2019-11-30 DIAGNOSIS — M8580 Other specified disorders of bone density and structure, unspecified site: Secondary | ICD-10-CM

## 2019-11-30 DIAGNOSIS — R002 Palpitations: Secondary | ICD-10-CM | POA: Diagnosis not present

## 2019-11-30 DIAGNOSIS — R109 Unspecified abdominal pain: Secondary | ICD-10-CM

## 2019-11-30 DIAGNOSIS — R739 Hyperglycemia, unspecified: Secondary | ICD-10-CM

## 2019-11-30 NOTE — Assessment & Plan Note (Signed)
Encouraged to get adequate exercise, calcium and vitamin d intake 

## 2019-11-30 NOTE — Assessment & Plan Note (Signed)
Radiates to back at times. Encouraged to return to gastroenterology for further evaluation.

## 2019-11-30 NOTE — Assessment & Plan Note (Signed)
Supplement and monitor 

## 2019-11-30 NOTE — Patient Instructions (Signed)

## 2019-11-30 NOTE — Assessment & Plan Note (Addendum)
Check lipase and amylase. Minimize simple carbohydrates. Lipase continues to be mildly elevated. She will follow up with gastreoenterology if symptoms worsen

## 2019-12-01 DIAGNOSIS — R748 Abnormal levels of other serum enzymes: Secondary | ICD-10-CM | POA: Insufficient documentation

## 2019-12-01 LAB — COMPREHENSIVE METABOLIC PANEL
ALT: 29 U/L (ref 0–35)
AST: 21 U/L (ref 0–37)
Albumin: 4 g/dL (ref 3.5–5.2)
Alkaline Phosphatase: 60 U/L (ref 39–117)
BUN: 14 mg/dL (ref 6–23)
CO2: 33 mEq/L — ABNORMAL HIGH (ref 19–32)
Calcium: 9.2 mg/dL (ref 8.4–10.5)
Chloride: 102 mEq/L (ref 96–112)
Creatinine, Ser: 1.03 mg/dL (ref 0.40–1.20)
GFR: 51.59 mL/min — ABNORMAL LOW (ref >60.00)
Glucose, Bld: 103 mg/dL — ABNORMAL HIGH (ref 70–99)
Potassium: 4.4 mEq/L (ref 3.5–5.1)
Sodium: 141 mEq/L (ref 135–145)
Total Bilirubin: 0.3 mg/dL (ref 0.2–1.2)
Total Protein: 5.9 g/dL — ABNORMAL LOW (ref 6.0–8.3)

## 2019-12-01 LAB — CBC
HCT: 39.4 % (ref 36.0–46.0)
Hemoglobin: 13.1 g/dL (ref 12.0–15.0)
MCHC: 33.4 g/dL (ref 30.0–36.0)
MCV: 93.5 fl (ref 78.0–100.0)
Platelets: 251 10*3/uL (ref 150.0–400.0)
RBC: 4.21 Mil/uL (ref 3.87–5.11)
RDW: 14.3 % (ref 11.5–15.5)
WBC: 9 10*3/uL (ref 4.0–10.5)

## 2019-12-01 LAB — LIPASE: Lipase: 84 U/L — ABNORMAL HIGH (ref 11.0–59.0)

## 2019-12-01 LAB — TSH: TSH: 2.8 u[IU]/mL (ref 0.35–4.50)

## 2019-12-01 LAB — HEMOGLOBIN A1C: Hgb A1c MFr Bld: 5.8 % (ref 4.6–6.5)

## 2019-12-01 LAB — AMYLASE: Amylase: 89 U/L (ref 27–131)

## 2019-12-01 LAB — VITAMIN D 25 HYDROXY (VIT D DEFICIENCY, FRACTURES): VITD: 51.51 ng/mL (ref 30.00–100.00)

## 2019-12-01 LAB — GAMMA GT: GGT: 23 U/L (ref 7–51)

## 2019-12-01 NOTE — Assessment & Plan Note (Signed)
Avoid offending foods, start probiotics. Do not eat large meals in late evening and consider raising head of bed.  

## 2019-12-01 NOTE — Assessment & Plan Note (Signed)
No recent episodes. No changes

## 2019-12-01 NOTE — Progress Notes (Signed)
Subjective:    Patient ID: [name], female    DOB: 15-Jun-1940, 79 y.o.   MRN: [id number]  Chief Complaint  Patient presents with  . Follow-up    HPI Patient is in today for follow up on chronic medical concerns. No recent febrile illness or hospitalizations. She is trying to be safe during the pandemic but is vaccinated and starting to participate in a few more activities. Denies CP/palp/SOB/HA/congestion/fevers/GI or GU c/o. Taking meds as prescribed. Her back pain is improving after her L1 wedge fracture. She has been following with Dr Rolena Infante of ortho. She has infrequent episodes, 1-2 since last visit of her blood pressure dropping suddenly leading to N/V/Syncope. Has not happened in awhile and she feels well today  Past Medical History:  Diagnosis Date  . Adenomatous colon polyp   . Allergic state   . Allergy   . ANXIETY, CHRONIC   . 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 CHEST PAIN    LHC 02/2011: normal cors, EF 65%  . Contact dermatitis 10/13/2017  . Diverticulosis   . Esophageal reflux   . Fibrocystic breast   . Hand injury, left, initial encounter 10/13/2017  . Hepatic cyst   . History of shingles   . HYPOTENSION, ORTHOSTATIC   . IBS (irritable bowel syndrome)   . Internal hemorrhoids without mention of complication   . Kidney lesion   . Kidney stone   . Liver cyst   . Osteoarthritis   . Osteopenia   . Osteopenia   . Osteoporosis   . Pain in ear   . PALPITATIONS, RECURRENT   . Pancreatitis chronic   . Preventative health care   . PSVT   . 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 SHOULDER PAIN   . Syncope and collapse    broke a rib, and a vertabre  . TOBACCO USE, QUIT   . VITAMIN D DEFICIENCY     Past Surgical History:  Procedure Laterality Date  . ABDOMINAL HYSTERECTOMY  1998  . BREAST SURGERY     breast bx on left, benign  . CARDIAC CATHETERIZATION    . CATARACT EXTRACTION, BILATERAL    . CATARACT EXTRACTION, BILATERAL    .  CHOLECYSTECTOMY  1999  . COLONOSCOPY    . EYE SURGERY Bilateral 04/2017, 05/2017   cataracts  . [place]  . TUBAL LIGATION     with D&C    Family History  Problem Relation Age of Onset  . Hypertension Mother   . Stroke Mother   . Alcohol abuse Mother   . Heart disease Father   . Bladder Cancer Father   . Diabetes Father   . Hypothyroidism Father   . Dementia Father   . Uterine cancer Paternal Grandmother   . Heart disease Sister        rheumatic fever at 6 months, cardiomegaly MI  . Heart disease Maternal Grandmother   . Stroke Maternal Grandmother   . Heart disease Maternal Grandfather   . Stroke Maternal Grandfather   . Breast cancer Cousin   . Pancreatic cancer Paternal Aunt   . Stomach cancer Paternal Uncle   . Breast cancer Paternal Aunt   . Lung cancer Paternal Uncle   . Crohn's disease Son   . Colon cancer Neg Hx   . Colon polyps Neg Hx   . Esophageal cancer Neg Hx   . Rectal cancer Neg Hx     Social History   Socioeconomic History  .  Marital status: Married    Spouse name: Not on file  . Number of children: 1  . Years of education: Not on file  . Highest education level: Not on file  Occupational History  . Occupation: Retired    Fish farm manager: RETIRED  Tobacco Use  . Smoking status: Former Smoker    Types: Cigarettes    Quit date: 05/27/1973    Years since quitting: 46.5  . Smokeless tobacco: Never Used  Vaping Use  . Vaping Use: Never used  Substance and Sexual Activity  . Alcohol use: No  . Drug use: No  . Sexual activity: Not on file  Other Topics Concern  . Not on file  Social History Narrative  . Not on file   Social Determinants of Health   Financial Resource Strain:   . Difficulty of Paying Living Expenses:   Food Insecurity:   . Worried About Charity fundraiser in the Last Year:   . Arboriculturist in the Last Year:   Transportation Needs:   . Film/video editor (Medical):   Marland Kitchen Lack of Transportation (Non-Medical):     Physical Activity:   . Days of Exercise per Week:   . Minutes of Exercise per Session:   Stress:   . Feeling of Stress :   Social Connections:   . Frequency of Communication with Friends and Family:   . Frequency of Social Gatherings with Friends and Family:   . Attends Religious Services:   . Active Member of Clubs or Organizations:   . Attends Archivist Meetings:   Marland Kitchen Marital Status:   Intimate Partner Violence:   . Fear of Current or Ex-Partner:   . Emotionally Abused:   Marland Kitchen Physically Abused:   . Sexually Abused:     Outpatient Medications Prior to Visit  Medication Sig Dispense Refill  . Ascorbic Acid (VITAMIN C) 500 MG tablet Take 500 mg by mouth daily.      Marland Kitchen b complex vitamins tablet Take 1 tablet by mouth daily.    . cetirizine (ZYRTEC) 10 MG tablet Take 10 mg by mouth daily.      . Cholecalciferol (VITAMIN D3) 1000 UNITS CAPS Take 1 tablet by mouth daily.     . Cranberry 500 MG CAPS Take 1 capsule by mouth daily.    . diphenoxylate-atropine (LOMOTIL) 2.5-0.025 MG tablet Take 1 tablet by mouth 4 (four) times daily as needed for diarrhea or loose stools. 360 tablet 1  . escitalopram (LEXAPRO) 10 MG tablet Take 1 tablet (10 mg total) by mouth daily. 90 tablet 3  . estradiol (VIVELLE-DOT) 0.025 MG/24HR Apply 1 patch topically to skin twice weekly. 8 patch 11  . fluticasone (FLONASE) 50 MCG/ACT nasal spray Place 2 sprays into both nostrils as needed for rhinitis or allergies. 16 g 0  . metroNIDAZOLE (METROCREAM) 0.75 % cream Apply 1 application topically 2 (two) times daily.     . Pancrelipase, Lip-Prot-Amyl, (CREON) 24000-76000 units CPEP TAKE 6 CAPSULES BY MOUTH WITH EACH MEAL AND 1 TO 2 CAPSULES WITH EACH SNACK. IF EATING OUT ( HIGH FAT FOOD ITEMS) , TAKE 8 CAPSULES WITH MEALS . MAX OF 24 CAPSULES PER DAY 2250 capsule 1  . pantoprazole (PROTONIX) 40 MG tablet TAKE 1 TABLET BY MOUTH  DAILY 90 tablet 1  . Probiotic Product (PROBIOTIC PO) Take by mouth. Takes Probaclac  probiotic once daily     . triamcinolone cream (KENALOG) 0.1 % Apply 1 application topically 2 (two) times  daily. 80 g 01  . TURMERIC PO Take by mouth daily.    Marland Kitchen tiZANidine (ZANAFLEX) 4 MG capsule Take 0.5-1 tablet by mouth twice daily as needed for back spasm 20 capsule 0   No facility-administered medications prior to visit.    Allergies  Allergen Reactions  . Amoxicillin     REACTION: unspecified  . Azithromycin     REACTION: Rash  . Ciprofloxacin     REACTION: GI upset  . Epinephrine     REACTION: unspecified  . Penicillins     REACTION: rash  . Sulfonamide Derivatives     REACTION: sick to stomach  . Tamiflu [Oseltamivir Phosphate] Hives    Review of Systems  Constitutional: Positive for malaise/fatigue. Negative for fever.  HENT: Negative for congestion.   Eyes: Negative for blurred vision.  Respiratory: Negative for shortness of breath.   Cardiovascular: Negative for chest pain, palpitations and leg swelling.  Gastrointestinal: Positive for abdominal pain. Negative for blood in stool and nausea.  Genitourinary: Negative for dysuria and frequency.  Musculoskeletal: Negative for falls.  Skin: Negative for rash.  Neurological: Negative for dizziness, loss of consciousness and headaches.  Endo/Heme/Allergies: Negative for environmental allergies.  Psychiatric/Behavioral: Negative for depression. The patient is not nervous/anxious.        Objective:    Physical Exam Vitals and nursing note reviewed.  Constitutional:      General: She is not in acute distress.    Appearance: She is well-developed.  HENT:     Head: Normocephalic and atraumatic.     Nose: Nose normal.  Eyes:     General:        Right eye: No discharge.        Left eye: No discharge.  Cardiovascular:     Rate and Rhythm: Normal rate and regular rhythm.     Heart sounds: No murmur heard.   Pulmonary:     Effort: Pulmonary effort is normal.     Breath sounds: Normal breath sounds.    Abdominal:     General: Bowel sounds are normal.     Palpations: Abdomen is soft.     Tenderness: There is no abdominal tenderness.  Musculoskeletal:     Cervical back: Normal range of motion and neck supple.  Skin:    General: Skin is warm and dry.  Neurological:     Mental Status: She is alert and oriented to person, place, and time.     BP 116/70 (BP Location: Right Arm, Cuff Size: Normal)   Pulse 85   Temp 97.9 F (36.6 C) (Temporal)   Resp 12   Ht 5\' 5"  (1.651 m)   Wt 124 lb 6.4 oz (56.4 kg)   SpO2 98%   BMI 20.70 kg/m  Wt Readings from Last 3 Encounters:  11/30/19 124 lb 6.4 oz (56.4 kg)  06/16/19 122 lb (55.3 kg)  05/31/19 125 lb (56.7 kg)    Diabetic Foot Exam - Simple   No data filed     Lab Results  Component Value Date   WBC 9.0 11/30/2019   HGB 13.1 11/30/2019   HCT 39.4 11/30/2019   PLT 251.0 11/30/2019   GLUCOSE 103 (H) 11/30/2019   CHOL 146 09/12/2016   TRIG 26.0 09/12/2016   HDL 102.30 09/12/2016   LDLCALC 38 09/12/2016   ALT 29 11/30/2019   AST 21 11/30/2019   NA 141 11/30/2019   K 4.4 11/30/2019   CL 102 11/30/2019   CREATININE 1.03 11/30/2019  BUN 14 11/30/2019   CO2 33 (H) 11/30/2019   TSH 2.80 11/30/2019   INR 1.00 03/22/2011   HGBA1C 5.8 11/30/2019    Lab Results  Component Value Date   TSH 2.80 11/30/2019   Lab Results  Component Value Date   WBC 9.0 11/30/2019   HGB 13.1 11/30/2019   HCT 39.4 11/30/2019   MCV 93.5 11/30/2019   PLT 251.0 11/30/2019   Lab Results  Component Value Date   NA 141 11/30/2019   K 4.4 11/30/2019   CO2 33 (H) 11/30/2019   GLUCOSE 103 (H) 11/30/2019   BUN 14 11/30/2019   CREATININE 1.03 11/30/2019   BILITOT 0.3 11/30/2019   ALKPHOS 60 11/30/2019   AST 21 11/30/2019   ALT 29 11/30/2019   PROT 5.9 (L) 11/30/2019   ALBUMIN 4.0 11/30/2019   CALCIUM 9.2 11/30/2019   ANIONGAP 11 03/20/2018   GFR 51.59 (L) 11/30/2019   Lab Results  Component Value Date   CHOL 146 09/12/2016   Lab  Results  Component Value Date   HDL 102.30 09/12/2016   Lab Results  Component Value Date   LDLCALC 38 09/12/2016   Lab Results  Component Value Date   TRIG 26.0 09/12/2016   Lab Results  Component Value Date   CHOLHDL 1 09/12/2016   Lab Results  Component Value Date   HGBA1C 5.8 11/30/2019       Assessment & Plan:   Problem List Items Addressed This Visit    Vitamin D deficiency - Primary    Supplement and monitor      Relevant Orders   Comprehensive metabolic panel (Completed)   VITAMIN D 25 Hydroxy (Vit-D Deficiency, Fractures) (Completed)   GERD    Avoid offending foods, start probiotics. Do not eat large meals in late evening and consider raising head of bed.       PALPITATIONS, RECURRENT    No recent episodes. No changes      Relevant Orders   CBC (Completed)   TSH (Completed)   Abdominal pain, epigastric    Radiates to back at times. Encouraged to return to gastroenterology for further evaluation.       Autoimmune pancreatitis (HCC)    Check lipase and amylase. Minimize simple carbohydrates. Lipase continues to be mildly elevated. She will follow up with gastreoenterology if symptoms worsen      Relevant Orders   Gamma GT (Completed)   Lipase (Completed)   Amylase (Completed)   Osteopenia    Encouraged to get adequate exercise, calcium and vitamin d intake      Fatty liver   Relevant Orders   Gamma GT (Completed)   Lipase (Completed)   Amylase (Completed)   Elevated alkaline phosphatase level    No concerning symptoms. Will have her repeat CMP fasting and add a GGT at that time       Other Visit Diagnoses    Hyperglycemia       Relevant Orders   Hemoglobin A1c (Completed)   Abdominal pain, unspecified abdominal location       Relevant Orders   Gamma GT (Completed)   Lipase (Completed)   Amylase (Completed)      I have discontinued Kateland J. Kubly's tiZANidine. I am also having her maintain her vitamin C, Vitamin D3, cetirizine,  Probiotic Product (PROBIOTIC PO), Cranberry, TURMERIC PO, metroNIDAZOLE, fluticasone, b complex vitamins, triamcinolone cream, escitalopram, estradiol, diphenoxylate-atropine, pantoprazole, and Creon.  No orders of the defined types were placed in this encounter.  Penni Homans, MD

## 2019-12-01 NOTE — Assessment & Plan Note (Signed)
No concerning symptoms. Will have her repeat CMP fasting and add a GGT at that time

## 2019-12-14 ENCOUNTER — Telehealth: Payer: Self-pay | Admitting: Family Medicine

## 2019-12-14 NOTE — Telephone Encounter (Signed)
Left message for patient to call back and schedule Medicare Annual Wellness Visit (AWV) with Nurse Health Advisor   This should be a 36 MINUTE VISIT.  Last AWV 10/15/18

## 2019-12-20 ENCOUNTER — Telehealth: Payer: Self-pay | Admitting: Family Medicine

## 2019-12-20 NOTE — Telephone Encounter (Signed)
Patient states that she would like to opt out of Medicare Wellness Visit for this year. Patient states that she has told out office  and she is still receiving calls.

## 2020-01-10 ENCOUNTER — Other Ambulatory Visit: Payer: Self-pay

## 2020-01-10 ENCOUNTER — Ambulatory Visit (INDEPENDENT_AMBULATORY_CARE_PROVIDER_SITE_OTHER): Payer: Medicare Other | Admitting: Internal Medicine

## 2020-01-10 ENCOUNTER — Encounter: Payer: Self-pay | Admitting: Internal Medicine

## 2020-01-10 VITALS — BP 142/82 | HR 86 | Ht 65.0 in | Wt 125.0 lb

## 2020-01-10 DIAGNOSIS — I951 Orthostatic hypotension: Secondary | ICD-10-CM | POA: Diagnosis not present

## 2020-01-10 NOTE — Patient Instructions (Signed)

## 2020-01-10 NOTE — Progress Notes (Signed)
Patient Care Team: Mosie Lukes, MD as PCP - General (Family Medicine) Deboraha Sprang, MD as Consulting Physician (Cardiology) Pyrtle, Lajuan Lines, MD as Consulting Physician (Gastroenterology) Rolm Bookbinder, MD as Consulting Physician (Dermatology) Alphonsa Overall, MD as Consulting Physician (General Surgery) Melissa Montane, MD as Consulting Physician (Otolaryngology) Luberta Mutter, MD as Consulting Physician (Ophthalmology)   HPI  [name] is a 79 y.o. female Seen in followup. She has a history of recurrent chest pain. Myoview scanning 2010 was normal. Because of recurrent chest pain, 2012 she underwent catheterization that was normal  The patient denies chest pain, shortness of breath, nocturnal dyspnea, orthopnea or peripheral edema.  There have been no palpitations  She has had recurrent syncope a couple of times a year over the last year and 1/2 to 2 years.  Most of these occur with warning; some of them are preceded with nausea and vomiting.  She did fall in her closet without warning.  Her pancreas issue continues to be problematic and is complicated by diarrhea which can be explosive.  It is not clear to her whether there is any temporal relationship with antecedent diarrhea and her syncope.  .      Past Medical History:  Diagnosis Date  . Adenomatous colon polyp   . Allergic state   . Allergy   . ANXIETY, CHRONIC   . 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 CHEST PAIN    LHC 02/2011: normal cors, EF 65%  . Contact dermatitis 10/13/2017  . Diverticulosis   . Esophageal reflux   . Fibrocystic breast   . Hand injury, left, initial encounter 10/13/2017  . Hepatic cyst   . History of shingles   . HYPOTENSION, ORTHOSTATIC   . IBS (irritable bowel syndrome)   . Internal hemorrhoids without mention of complication   . Kidney lesion   . Kidney stone   . Liver cyst   . Osteoarthritis   . Osteopenia   . Osteopenia   . Osteoporosis   . Pain in ear   .  PALPITATIONS, RECURRENT   . Pancreatitis chronic   . Preventative health care   . PSVT   . 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 SHOULDER PAIN   . Syncope and collapse    broke a rib, and a vertabre  . TOBACCO USE, QUIT   . VITAMIN D DEFICIENCY     Past Surgical History:  Procedure Laterality Date  . ABDOMINAL HYSTERECTOMY  1998  . BREAST SURGERY     breast bx on left, benign  . CARDIAC CATHETERIZATION    . CATARACT EXTRACTION, BILATERAL    . CATARACT EXTRACTION, BILATERAL    . CHOLECYSTECTOMY  1999  . COLONOSCOPY    . EYE SURGERY Bilateral 04/2017, 05/2017   cataracts  . [place]  . TUBAL LIGATION     with D&C    Current Outpatient Medications  Medication Sig Dispense Refill  . Ascorbic Acid (VITAMIN C) 500 MG tablet Take 500 mg by mouth daily.      Marland Kitchen b complex vitamins tablet Take 1 tablet by mouth daily.    . cetirizine (ZYRTEC) 10 MG tablet Take 10 mg by mouth daily.      . Cholecalciferol (VITAMIN D3) 1000 UNITS CAPS Take 1 tablet by mouth daily.     . Cranberry 500 MG CAPS Take 1 capsule by mouth daily.    . diphenoxylate-atropine (LOMOTIL) 2.5-0.025 MG tablet Take 1 tablet by mouth 4 (four)  times daily as needed for diarrhea or loose stools. 360 tablet 1  . escitalopram (LEXAPRO) 10 MG tablet Take 1 tablet (10 mg total) by mouth daily. 90 tablet 3  . estradiol (VIVELLE-DOT) 0.025 MG/24HR Apply 1 patch topically to skin twice weekly. 8 patch 11  . fluticasone (FLONASE) 50 MCG/ACT nasal spray Place 2 sprays into both nostrils as needed for rhinitis or allergies. 16 g 0  . metroNIDAZOLE (METROCREAM) 0.75 % cream Apply 1 application topically 2 (two) times daily.     . Pancrelipase, Lip-Prot-Amyl, (CREON) 24000-76000 units CPEP TAKE 6 CAPSULES BY MOUTH WITH EACH MEAL AND 1 TO 2 CAPSULES WITH EACH SNACK. IF EATING OUT ( HIGH FAT FOOD ITEMS) , TAKE 8 CAPSULES WITH MEALS . MAX OF 24 CAPSULES PER DAY 2250 capsule 1  . pantoprazole (PROTONIX) 40 MG tablet TAKE 1  TABLET BY MOUTH  DAILY 90 tablet 1  . Probiotic Product (PROBIOTIC PO) Take by mouth. Takes Probaclac probiotic once daily     . triamcinolone cream (KENALOG) 0.1 % Apply 1 application topically 2 (two) times daily. 80 g 01  . TURMERIC PO Take by mouth daily.     No current facility-administered medications for this visit.    Allergies  Allergen Reactions  . Amoxicillin     REACTION: unspecified  . Azithromycin     REACTION: Rash  . Ciprofloxacin     REACTION: GI upset  . Epinephrine     REACTION: unspecified  . Penicillins     REACTION: rash  . Sulfonamide Derivatives     REACTION: sick to stomach  . Tamiflu [Oseltamivir Phosphate] Hives    Review of Systems negative except from HPI and PMH  Physical Exam BP (!) 142/82   Pulse 86   Ht 5\' 5"  (1.651 m)   Wt 125 lb (56.7 kg)   SpO2 99%   BMI 20.80 kg/m  Well developed and nourished in no acute distress HENT normal Neck supple with JVP-  flat *  Clear Regular rate and rhythm, no murmurs or gallops Abd-soft with active BS No Clubbing cyanosis edema Skin-warm and dry A & Oriented  Grossly normal sensory and motor function  ECG sinus at 78 Intervals 13/09/39 Otherwise normal     Assessment and  Plan  Hypertension - white coat.  Atrial Tach-- no significant recurrences  Orthostatic intolerance  Pancreatitis  Diarrhea  Syncope     She has had recurrent syncope.  I wonder whether it is temporally related to aggravation of her explosive, voluminous diarrhea.  I have encouraged her to take electrolyte solutions in the wake of her diarrhea to try to maintain hydration.  We have discussed NUUN, liquid IV and Pedialyte advance care.  She tends to be averse to salt taste and we talked about Vitassium and ThermaTabs as alternatives  And the importance of being cognizant of the prodrome.  I have reached out to Dr. Maryellen Pile about her aspirin.  Not sure its indication for his resuming.

## 2020-04-10 ENCOUNTER — Encounter: Payer: Self-pay | Admitting: *Deleted

## 2020-04-13 ENCOUNTER — Ambulatory Visit (INDEPENDENT_AMBULATORY_CARE_PROVIDER_SITE_OTHER): Payer: Medicare Other | Admitting: Internal Medicine

## 2020-04-13 ENCOUNTER — Encounter: Payer: Self-pay | Admitting: Internal Medicine

## 2020-04-13 VITALS — BP 122/70 | HR 88 | Ht 64.0 in | Wt 123.4 lb

## 2020-04-13 DIAGNOSIS — K861 Other chronic pancreatitis: Secondary | ICD-10-CM

## 2020-04-13 DIAGNOSIS — K580 Irritable bowel syndrome with diarrhea: Secondary | ICD-10-CM | POA: Diagnosis not present

## 2020-04-13 DIAGNOSIS — K219 Gastro-esophageal reflux disease without esophagitis: Secondary | ICD-10-CM

## 2020-04-13 DIAGNOSIS — K760 Fatty (change of) liver, not elsewhere classified: Secondary | ICD-10-CM | POA: Diagnosis not present

## 2020-04-13 MED ORDER — PANTOPRAZOLE SODIUM 40 MG PO TBEC: 40.0000 mg | DELAYED_RELEASE_TABLET | Freq: Every day | ORAL | 1 refills | Status: DC

## 2020-04-13 MED ORDER — COLESTIPOL HCL 1 G PO TABS: 1.0000 g | ORAL_TABLET | Freq: Every evening | ORAL | 3 refills | Status: DC

## 2020-04-13 MED ORDER — DIPHENOXYLATE-ATROPINE 2.5-0.025 MG PO TABS: 1.0000 | ORAL_TABLET | Freq: Four times a day (QID) | ORAL | 1 refills | Status: DC | PRN

## 2020-04-13 NOTE — Progress Notes (Signed)
Subjective:    Patient ID: [name], female    DOB: 1940/07/31, 79 y.o.   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  She is here alone and in person after having last been seen in January 2021.  She reports that she is doing well.  She is worried about her fatty liver which was seen on the MRI scan earlier this year.  She is not having any issues with abdominal or lower extremity edema.  No jaundice.  No itching.  She is still struggling with diarrhea and loose stool issues.  This seems to be worse if she eats out and perhaps if she eats a fattier meal.  She and her husband have a longstanding habit of going to [employer] for breakfast on Friday and Saturdays.  They enjoy this time together and she usually eats a sausage McMuffin and often will have loose stools thereafter which can be urgent and are loose and watery at times.  No blood in stool or melena.  She continues to eat a healthy diet otherwise with granola bars.  She did try the FODMAP diet at one point which did not seem to help significantly her diarrhea.  She does avoid apples, pears and broccoli.  Broccoli makes her more bloated.  She will use 1-2 Lomotil on days when she has the loose stools and this loose stool can occur multiple days per week but is not necessarily daily.  Her appetite has been normal and weight stable.  She continues pantoprazole which is controlling her reflux very well.  She does take a daily multivitamin.  At last visit I raised her Creon from 6capsules to 8 capsules in the morning to try to help with loose stools but this did not make a significant change.  Thus she is continued 6 capsules of Creon with meals.  Her last colonoscopy was in September 2019,  at that time 5-year recall was recommended   Review of Systems As per HPI, otherwise negative  Current Medications, Allergies, Past Medical History, Past Surgical History, Family History and Social History were reviewed in [employer] record.     Objective:   Physical Exam BP 122/70 (BP Location: Left Arm, Patient Position: Sitting, Cuff Size: Normal)   Pulse 88   Ht 5\' 4"  (1.626 m) Comment: height measured without shoes  Wt 123 lb 6 oz (56 kg)   BMI 21.18 kg/m  Gen: awake, alert, NAD HEENT: anicteric CV: RRR, no mrg Pulm: CTA b/l Abd: soft, NT/ND, +BS throughout Ext: no c/c/e Neuro: nonfocal  CMP     Component Value Date/Time   NA 141 11/30/2019 1402   NA 143 02/13/2017 1136   K 4.4 11/30/2019 1402   CL 102 11/30/2019 1402   CO2 33 (H) 11/30/2019 1402   GLUCOSE 103 (H) 11/30/2019 1402   BUN 14 11/30/2019 1402   BUN 14 02/13/2017 1136   CREATININE 1.03 11/30/2019 1402   CREATININE 0.92 03/26/2013 0931   CALCIUM 9.2 11/30/2019 1402   PROT 5.9 (L) 11/30/2019 1402   ALBUMIN 4.0 11/30/2019 1402   AST 21 11/30/2019 1402   ALT 29 11/30/2019 1402   ALKPHOS 60 11/30/2019 1402   BILITOT 0.3 11/30/2019 1402   GFRNONAA 51 (L) 03/20/2018 1403   GFRAA 59 (  L) 03/20/2018 1403   GGT normal  Lipase     Component Value Date/Time   LIPASE 84.0 (H) 11/30/2019 1402   MRI ABDOMEN WITHOUT AND WITH CONTRAST (INCLUDING MRCP)   TECHNIQUE: Multiplanar multisequence MR imaging of the abdomen was performed both before and after the administration of intravenous contrast. Heavily T2-weighted images of the biliary and pancreatic ducts were obtained, and three-dimensional MRCP images were rendered by post processing.   CONTRAST:  54mL GADAVIST GADOBUTROL 1 MMOL/ML IV SOLN   COMPARISON:  04/03/2016 MRI abdomen.  03/20/2018 CT abdomen/pelvis.   FINDINGS: Lower chest: No acute abnormality at the lung bases.   Hepatobiliary: Normal liver size and  configuration. Mild diffuse hepatic steatosis. Two scattered simple liver cysts, largest 1.4 cm in the lower right liver lobe. No suspicious liver masses. Cholecystectomy. Bile ducts are stable and within normal post cholecystectomy limits. Common bile duct diameter 7 mm. No evidence of choledocholithiasis. No biliary masses, strictures or beading.   Pancreas: Normal pancreatic parenchymal signal intensity and morphology. No pancreatic masses. Stable top-normal caliber main pancreatic duct (10 mm diameter). No peripancreatic edema or fluid collections. No pancreas divisum.   Spleen: Normal size. No mass.   Adrenals/Urinary Tract: Normal adrenals. No hydronephrosis. There are three scattered tiny T1 hyperintense renal cortical lesions in the mid to upper left kidney, largest 0.8 cm (series 1100/image 27), which demonstrate no appreciable enhancement on the subtraction sequences, compatible with hemorrhagic/proteinaceous Bosniak category 2 renal cysts. Several simple renal cysts scattered in both kidneys, largest 1.4 cm in the posterior lower left kidney. No suspicious renal masses.   Stomach/Bowel: Normal non-distended stomach. Visualized small and large bowel is normal caliber, with no bowel wall thickening.   Vascular/Lymphatic: Normal caliber abdominal aorta. Patent portal, splenic, hepatic and renal veins. No pathologically enlarged lymph nodes in the abdomen.   Other: No abdominal ascites or focal fluid collection.   Musculoskeletal: No aggressive appearing focal osseous lesions.   IMPRESSION: 1. No acute abnormality. Stable unremarkable MRI appearance of the pancreas with no pancreatic mass and no evidence of pancreatitis. 2. Mild diffuse hepatic steatosis. 3. Scattered small Bosniak category 1 and category 2 renal cysts.     Electronically Signed   By: Ilona Sorrel M.D.   On: 06/28/2019 09:36      Assessment & Plan:  79 year old with a history of chronic  autoimmune pancreatitis with pancreatic insufficiency, IBS with loose stool predominance, GERD, hepatic steatosis, adenomatous colon polyp history, prior internal hemorrhoids treated with banding who is here for follow-up.   1.  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  I think her loose stools are not explained by pancreatic insufficiency but rather more IBS type response perhaps related to bile salt diarrhea.  I am going to treat her with a bile sequestrant to see if this improves symptoms --Continue Creon 6 capsules with meals, 1-3 with snacks --Add colestipol 1 g in the evening, apart from other medications.  We may dose titrate this to 2 g as tolerated --Can continue to use Lomotil 1 to 2 tablets 4 times daily as needed  2.  Fatty liver --liver enzymes normal when checked recently which is reassuring.  There is no evidence for advanced liver disease or cirrhosis.  We discussed fatty liver today and she does not have many risk factors to explain her steatosis.  I  have recommended she use vitamin E --Vitamin E 800 IU daily (she is already on a multivitamin so I asked that she check the dose of vitamin E and this multivitamin and supplement with additional vitamin E totaling 800 IU daily) --Reassurance with recent MRI/MRCP.  Benign liver cyst.  Normal biliary tree.  No concerning liver masses.  3.  GERD dyspepsia --doing well with good control of symptoms, continue pantoprazole 40 mg daily  4.  History of adenomatous colon polyps --consider surveillance colonoscopy in 2022, Sept  3 to 23-month follow-up to ensure symptoms are improving particularly relates to her diarrhea  30 minutes total spent today including patient facing time, coordination of care, reviewing medical history/procedures/pertinent radiology studies, and  documentation of the encounter.

## 2020-04-13 NOTE — Patient Instructions (Addendum)
Continue creon.  Please follow up with Dr Hilarie Fredrickson in 3-4 months in the office.  Please purchase the following medications over the counter and take as directed: Vitamin E 800 IU daily (if you take a multivitamin, you may already be getting some of this in everyday. Just supplement to make sure you are getting the additional dosage of Vitamin E to get to 800 IU daily).  Continue pantoprazole 40 mg daily.  Continue lomotil four times daily as needed.  We have sent the following medications to your pharmacy for you to pick up at your convenience: Colestipol 1 gram every afternoon for diarrhea (take at least 1 hour apart from all other medications)  If you are age 40 or older, your body mass index should be between 23-30. Your Body mass index is 21.18 kg/m. If this is out of the aforementioned range listed, please consider follow up with your Primary Care Provider.  Due to recent changes in healthcare laws, you may see the results of your imaging and laboratory studies on MyChart before your provider has had a chance to review them.  We understand that in some cases there may be results that are confusing or concerning to you. Not all laboratory results come back in the same time frame and the provider may be waiting for multiple results in order to interpret others.  Please give [place] 48 hours in order for your provider to thoroughly review all the results before contacting the office for clarification of your results.

## 2020-04-18 ENCOUNTER — Other Ambulatory Visit: Payer: Self-pay | Admitting: Internal Medicine

## 2020-04-18 ENCOUNTER — Other Ambulatory Visit: Payer: Self-pay | Admitting: Family Medicine

## 2020-04-18 DIAGNOSIS — F419 Anxiety disorder, unspecified: Secondary | ICD-10-CM

## 2020-04-19 ENCOUNTER — Telehealth: Payer: Self-pay | Admitting: Internal Medicine

## 2020-04-19 NOTE — Telephone Encounter (Signed)
I have left a message on patient voicemail advising that I did send a 3 month supply of her pantoprazole and creon to the pharmacy but that we need her to follow up with [place] in 3 months. Once she is stable on her current medication regimen, we can start sending longer supply.

## 2020-05-04 ENCOUNTER — Other Ambulatory Visit: Payer: Self-pay | Admitting: Internal Medicine

## 2020-05-09 MED ORDER — CREON 24000-76000 UNITS PO CPEP: ORAL_CAPSULE | ORAL | 0 refills | Status: DC

## 2020-05-09 NOTE — Addendum Note (Signed)
Addended by: Larina Bras on: 05/09/2020 03:37 PM   Modules accepted: Orders

## 2020-05-09 NOTE — Telephone Encounter (Signed)
Patient called states the Creon medication was sent to [facility] instead of [facility] and they only have 1 month supply requesting we send the rest needed t [facility]

## 2020-05-09 NOTE — Telephone Encounter (Signed)
I have resent creon to [facility]. Patient is scheduled for appointment follow up with Dr Hilarie Fredrickson 07/12/20.

## 2020-05-31 ENCOUNTER — Other Ambulatory Visit: Payer: Self-pay

## 2020-06-01 ENCOUNTER — Ambulatory Visit (INDEPENDENT_AMBULATORY_CARE_PROVIDER_SITE_OTHER): Payer: Medicare Other | Admitting: Family Medicine

## 2020-06-01 ENCOUNTER — Other Ambulatory Visit: Payer: Self-pay

## 2020-06-01 ENCOUNTER — Encounter: Payer: Self-pay | Admitting: Family Medicine

## 2020-06-01 VITALS — BP 114/64 | HR 67 | Temp 98.2°F | Resp 16 | Ht 65.0 in | Wt 122.8 lb

## 2020-06-01 DIAGNOSIS — Z0000 Encounter for general adult medical examination without abnormal findings: Secondary | ICD-10-CM

## 2020-06-01 DIAGNOSIS — F419 Anxiety disorder, unspecified: Secondary | ICD-10-CM

## 2020-06-01 DIAGNOSIS — M810 Age-related osteoporosis without current pathological fracture: Secondary | ICD-10-CM | POA: Diagnosis not present

## 2020-06-01 DIAGNOSIS — J101 Influenza due to other identified influenza virus with other respiratory manifestations: Secondary | ICD-10-CM

## 2020-06-01 DIAGNOSIS — T7840XD Allergy, unspecified, subsequent encounter: Secondary | ICD-10-CM

## 2020-06-01 DIAGNOSIS — E559 Vitamin D deficiency, unspecified: Secondary | ICD-10-CM | POA: Diagnosis not present

## 2020-06-01 DIAGNOSIS — R748 Abnormal levels of other serum enzymes: Secondary | ICD-10-CM

## 2020-06-01 DIAGNOSIS — K861 Other chronic pancreatitis: Secondary | ICD-10-CM

## 2020-06-01 DIAGNOSIS — F411 Generalized anxiety disorder: Secondary | ICD-10-CM

## 2020-06-01 DIAGNOSIS — R739 Hyperglycemia, unspecified: Secondary | ICD-10-CM

## 2020-06-01 NOTE — Assessment & Plan Note (Signed)
Supplement and monitor 

## 2020-06-01 NOTE — Assessment & Plan Note (Signed)
Working with Dr Rhea Belton and is on Colestid but she does not believe it is helping her diarrhea yet. She does acknowledge it has been hard to remember to use.

## 2020-06-01 NOTE — Assessment & Plan Note (Addendum)
Patient encouraged to maintain heart healthy diet, regular exercise, adequate sleep. Consider daily probiotics. Take medications as prescribed. Labs ordered and reviewed. Last [facility] January 2021. Has one scheduled for next month. She has had the first two COVID shots but has chosen not to have the 3 rd shot. She is advised that she is at increased risk for severe infection if she does not boost.

## 2020-06-01 NOTE — Assessment & Plan Note (Addendum)
Encouraged to get adequate exercise, calcium and vitamin d intake. She declines meds at this time. She is asked to continue Prolia

## 2020-06-01 NOTE — Patient Instructions (Signed)
Prolia injections twice a year here at the office or can discuss with Endocrinology Preventive Care 60 Years and Older, Female Preventive care refers to lifestyle choices and visits with your health care provider that can promote health and wellness. This includes:  A yearly physical exam. This is also called an annual well check.  Regular dental and eye exams.  Immunizations.  Screening for certain conditions.  Healthy lifestyle choices, such as diet and exercise. What can I expect for my preventive care visit? Physical exam Your health care provider will check:  Height and weight. These may be used to calculate body mass index (BMI), which is a measurement that tells if you are at a healthy weight.  Heart rate and blood pressure.  Your skin for abnormal spots. Counseling Your health care provider may ask you questions about:  Alcohol, tobacco, and drug use.  Emotional well-being.  Home and relationship well-being.  Sexual activity.  Eating habits.  History of falls.  Memory and ability to understand (cognition).  Work and work Statistician.  Pregnancy and menstrual history. What immunizations do I need?  Influenza (flu) vaccine  This is recommended every year. Tetanus, diphtheria, and pertussis (Tdap) vaccine  You may need a Td booster every 10 years. Varicella (chickenpox) vaccine  You may need this vaccine if you have not already been vaccinated. Zoster (shingles) vaccine  You may need this after age 35. Pneumococcal conjugate (PCV13) vaccine  One dose is recommended after age 26. Pneumococcal polysaccharide (PPSV23) vaccine  One dose is recommended after age 83. Measles, mumps, and rubella (MMR) vaccine  You may need at least one dose of MMR if you were born in 1957 or later. You may also need a second dose. Meningococcal conjugate (MenACWY) vaccine  You may need this if you have certain conditions. Hepatitis A vaccine  You may need this if  you have certain conditions or if you travel or work in places where you may be exposed to hepatitis A. Hepatitis B vaccine  You may need this if you have certain conditions or if you travel or work in places where you may be exposed to hepatitis B. Haemophilus influenzae type b (Hib) vaccine  You may need this if you have certain conditions. You may receive vaccines as individual doses or as more than one vaccine together in one shot (combination vaccines). Talk with your health care provider about the risks and benefits of combination vaccines. What tests do I need? Blood tests  Lipid and cholesterol levels. These may be checked every 5 years, or more frequently depending on your overall health.  Hepatitis C test.  Hepatitis B test. Screening  Lung cancer screening. You may have this screening every year starting at age 46 if you have a 30-pack-year history of smoking and currently smoke or have quit within the past 15 years.  Colorectal cancer screening. All adults should have this screening starting at age 25 and continuing until age 3. Your health care provider may recommend screening at age 16 if you are at increased risk. You will have tests every 1-10 years, depending on your results and the type of screening test.  Diabetes screening. This is done by checking your blood sugar (glucose) after you have not eaten for a while (fasting). You may have this done every 1-3 years.  Mammogram. This may be done every 1-2 years. Talk with your health care provider about how often you should have regular mammograms.  BRCA-related cancer screening. This may be  done if you have a family history of breast, ovarian, tubal, or peritoneal cancers. Other tests  Sexually transmitted disease (STD) testing.  Bone density scan. This is done to screen for osteoporosis. You may have this done starting at age 57. Follow these instructions at home: Eating and drinking  Eat a diet that includes fresh  fruits and vegetables, whole grains, lean protein, and low-fat dairy products. Limit your intake of foods with high amounts of sugar, saturated fats, and salt.  Take vitamin and mineral supplements as recommended by your health care provider.  Do not drink alcohol if your health care provider tells you not to drink.  If you drink alcohol: ? Limit how much you have to 0-1 drink a day. ? Be aware of how much alcohol is in your drink. In the [place] one drink equals one 12 oz bottle of beer (355 mL), one 5 oz glass of wine (148 mL), or one 1 oz glass of hard liquor (44 mL). Lifestyle  Take daily care of your teeth and gums.  Stay active. Exercise for at least 30 minutes on 5 or more days each week.  Do not use any products that contain nicotine or tobacco, such as cigarettes, e-cigarettes, and chewing tobacco. If you need help quitting, ask your health care provider.  If you are sexually active, practice safe sex. Use a condom or other form of protection in order to prevent STIs (sexually transmitted infections).  Talk with your health care provider about taking a low-dose aspirin or statin. What's next?  Go to your health care provider once a year for a well check visit.  Ask your health care provider how often you should have your eyes and teeth checked.  Stay up to date on all vaccines. This information is not intended to replace advice given to you by your health care provider. Make sure you discuss any questions you have with your health care provider. Document Revised: 05/07/2018 Document Reviewed: 05/07/2018 Elsevier Patient Education  2020 [employer].

## 2020-06-04 MED ORDER — FLUTICASONE PROPIONATE 50 MCG/ACT NA SUSP
2.0000 | NASAL | 3 refills | Status: DC | PRN
Start: 2020-06-04 — End: 2022-01-09

## 2020-06-04 MED ORDER — CETIRIZINE HCL 10 MG PO TABS
10.0000 mg | ORAL_TABLET | Freq: Every day | ORAL | 3 refills | Status: AC
Start: 2020-06-04 — End: ?

## 2020-06-04 MED ORDER — ESCITALOPRAM OXALATE 10 MG PO TABS: 10.0000 mg | ORAL_TABLET | Freq: Every day | ORAL | 3 refills | Status: DC

## 2020-06-04 NOTE — Assessment & Plan Note (Signed)
Given refill on Cetirizine and Flonase

## 2020-06-04 NOTE — Assessment & Plan Note (Signed)
Good response to Lexapro refills given today

## 2020-06-04 NOTE — Progress Notes (Signed)
Subjective:    Patient ID: [name], female    DOB: December 17, 1940, 80 y.o.   MRN: [id number]  Chief Complaint  Patient presents with  . Follow-up  . Annual Exam    HPI Patient is in today for annual preventative exam and follow up on chronic medical concerns. She is largely doing well. She is using the Colestid given to her by gastroenterology but only intermittently but she does think it may help some. She notes she still has some back pain but it is is some better as she works with Dr Rolena Infante. Her Lexapro has been helpful and she needs a refill. She uses zyrtec and flonase daily and she needs refills as well. Denies CP/palp/SOB/HA/congestion/fevers or GU c/o. Taking meds as prescribed  Past Medical History:  Diagnosis Date  . Adenomatous colon polyp   . Allergic state   . Allergy   . ANXIETY, CHRONIC   . 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 CHEST PAIN    LHC 02/2011: normal cors, EF 65%  . Contact dermatitis 10/13/2017  . Diverticulosis   . Esophageal reflux   . Fibrocystic breast   . Hand injury, left, initial encounter 10/13/2017  . Hepatic cyst   . Hepatic steatosis   . History of shingles   . HYPOTENSION, ORTHOSTATIC   . IBS (irritable bowel syndrome)   . Internal hemorrhoids without mention of complication   . Kidney lesion   . Kidney stone   . Liver cyst   . Osteoarthritis   . Osteopenia   . Osteopenia   . Osteoporosis   . Pain in ear   . PALPITATIONS, RECURRENT   . Pancreatitis chronic   . Preventative health care   . PSVT   . 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 SHOULDER PAIN   . Syncope and collapse    broke a rib, and a vertabre  . TOBACCO USE, QUIT   . VITAMIN D DEFICIENCY     Past Surgical History:  Procedure Laterality Date  . ABDOMINAL HYSTERECTOMY  1998  . BREAST SURGERY     breast bx on left, benign  . CARDIAC CATHETERIZATION    . CATARACT EXTRACTION, BILATERAL    . CATARACT EXTRACTION, BILATERAL    . CHOLECYSTECTOMY  1999   . COLONOSCOPY    . EYE SURGERY Bilateral 04/2017, 05/2017   cataracts  . [place]  . TUBAL LIGATION     with D&C    Family History  Problem Relation Age of Onset  . Hypertension Mother   . Stroke Mother   . Alcohol abuse Mother   . Heart disease Father   . Bladder Cancer Father   . Diabetes Father   . Hypothyroidism Father   . Dementia Father   . Uterine cancer Paternal Grandmother   . Heart disease Sister        rheumatic fever at 6 months, cardiomegaly MI  . Heart disease Maternal Grandmother   . Stroke Maternal Grandmother   . Heart disease Maternal Grandfather   . Stroke Maternal Grandfather   . Breast cancer Cousin   . Pancreatic cancer Paternal Aunt   . Stomach cancer Paternal Uncle   . Breast cancer Paternal Aunt   . Lung cancer Paternal Uncle   . Crohn's disease Son   . Colon cancer Neg Hx   . Colon polyps Neg Hx   . Esophageal cancer Neg Hx   . Rectal cancer Neg Hx  Social History   Socioeconomic History  . Marital status: Married    Spouse name: Not on file  . Number of children: 1  . Years of education: Not on file  . Highest education level: Not on file  Occupational History  . Occupation: Retired    Fish farm manager: RETIRED  Tobacco Use  . Smoking status: Former Smoker    Types: Cigarettes    Quit date: 05/27/1973    Years since quitting: 47.0  . Smokeless tobacco: Never Used  Vaping Use  . Vaping Use: Never used  Substance and Sexual Activity  . Alcohol use: No  . Drug use: No  . Sexual activity: Not on file  Other Topics Concern  . Not on file  Social History Narrative  . Not on file   Social Determinants of Health   Financial Resource Strain: Not on file  Food Insecurity: Not on file  Transportation Needs: Not on file  Physical Activity: Not on file  Stress: Not on file  Social Connections: Not on file  Intimate Partner Violence: Not on file    Outpatient Medications Prior to Visit  Medication Sig Dispense Refill  .  Ascorbic Acid (VITAMIN C) 500 MG tablet Take 500 mg by mouth daily.    Marland Kitchen b complex vitamins tablet Take 1 tablet by mouth daily.    . cetirizine (ZYRTEC) 10 MG tablet Take 10 mg by mouth daily.    . Cholecalciferol (VITAMIN D3) 1000 UNITS CAPS Take 1 tablet by mouth daily.    . colestipol (COLESTID) 1 g tablet Take 1 tablet (1 g total) by mouth every evening. Take 1 hour apart from all other medications 30 tablet 3  . Cranberry 500 MG CAPS Take 1 capsule by mouth daily.    . diphenoxylate-atropine (LOMOTIL) 2.5-0.025 MG tablet Take 1 tablet by mouth 4 (four) times daily as needed for diarrhea or loose stools. 360 tablet 1  . escitalopram (LEXAPRO) 10 MG tablet Take 1 tablet (10 mg total) by mouth daily. 90 tablet 0  . estradiol (VIVELLE-DOT) 0.025 MG/24HR Apply 1 patch topically to skin twice weekly. 8 patch 11  . fluticasone (FLONASE) 50 MCG/ACT nasal spray Place 2 sprays into both nostrils as needed for rhinitis or allergies. 16 g 0  . Pancrelipase, Lip-Prot-Amyl, (CREON) 24000-76000 units CPEP TAKE 6 CAPSULES BY MOUTH WITH MEALS AND 1 TO 3 CAPSULES WITH EACH SNACK. IF EATING OUT (HIGH FAT FOOD ITEMS, TAKE 8 CAPSULES WITH MEALS). NEEDS OFFICE VISIT FOR FURTHER REFILLS 2970 capsule 0  . pantoprazole (PROTONIX) 40 MG tablet Take 1 tablet (40 mg total) by mouth daily. 90 tablet 1  . Probiotic Product (PROBIOTIC PO) Take by mouth. Takes Probaclac probiotic once daily    . triamcinolone cream (KENALOG) 0.1 % Apply 1 application topically 2 (two) times daily. 80 g 01  . TURMERIC PO Take by mouth daily.     No facility-administered medications prior to visit.    Allergies  Allergen Reactions  . Amoxicillin     REACTION: unspecified  . Azithromycin     REACTION: Rash  . Ciprofloxacin     REACTION: GI upset  . Epinephrine     REACTION: unspecified  . Penicillins     REACTION: rash  . Sulfonamide Derivatives     REACTION: sick to stomach  . Tamiflu [Oseltamivir Phosphate] Hives    Review  of Systems  Constitutional: Positive for malaise/fatigue. Negative for chills and fever.  HENT: Negative for congestion and hearing loss.  Eyes: Negative for discharge.  Respiratory: Negative for cough, sputum production and shortness of breath.   Cardiovascular: Negative for chest pain, palpitations and leg swelling.  Gastrointestinal: Positive for diarrhea. Negative for abdominal pain, blood in stool, constipation, heartburn, nausea and vomiting.  Genitourinary: Negative for dysuria, frequency, hematuria and urgency.  Musculoskeletal: Positive for back pain. Negative for falls and myalgias.  Skin: Negative for rash.  Neurological: Negative for dizziness, sensory change, loss of consciousness, weakness and headaches.  Endo/Heme/Allergies: Negative for environmental allergies. Does not bruise/bleed easily.  Psychiatric/Behavioral: Negative for depression and suicidal ideas. The patient is not nervous/anxious and does not have insomnia.        Objective:    Physical Exam Constitutional:      General: She is not in acute distress.    Appearance: She is well-developed and well-nourished.  HENT:     Head: Normocephalic and atraumatic.  Eyes:     Conjunctiva/sclera: Conjunctivae normal.  Neck:     Thyroid: No thyromegaly.  Cardiovascular:     Rate and Rhythm: Normal rate and regular rhythm.     Heart sounds: Normal heart sounds. No murmur heard.   Pulmonary:     Effort: Pulmonary effort is normal. No respiratory distress.     Breath sounds: Normal breath sounds.  Abdominal:     General: Bowel sounds are normal. There is no distension.     Palpations: Abdomen is soft. There is no mass.     Tenderness: There is no abdominal tenderness.  Musculoskeletal:        General: No edema.     Cervical back: Neck supple.  Lymphadenopathy:     Cervical: No cervical adenopathy.  Skin:    General: Skin is warm and dry.  Neurological:     Mental Status: She is alert and oriented to person,  place, and time.  Psychiatric:        Mood and Affect: Mood and affect normal.        Behavior: Behavior normal.     BP 114/64   Pulse 67   Temp 98.2 F (36.8 C) (Oral)   Resp 16   Ht 5\' 5"  (1.651 m)   Wt 122 lb 12.8 oz (55.7 kg)   SpO2 97%   BMI 20.43 kg/m  Wt Readings from Last 3 Encounters:  06/01/20 122 lb 12.8 oz (55.7 kg)  04/13/20 123 lb 6 oz (56 kg)  01/10/20 125 lb (56.7 kg)    Diabetic Foot Exam - Simple   No data filed    Lab Results  Component Value Date   WBC 9.0 11/30/2019   HGB 13.1 11/30/2019   HCT 39.4 11/30/2019   PLT 251.0 11/30/2019   GLUCOSE 103 (H) 11/30/2019   CHOL 146 09/12/2016   TRIG 26.0 09/12/2016   HDL 102.30 09/12/2016   LDLCALC 38 09/12/2016   ALT 29 11/30/2019   AST 21 11/30/2019   NA 141 11/30/2019   K 4.4 11/30/2019   CL 102 11/30/2019   CREATININE 1.03 11/30/2019   BUN 14 11/30/2019   CO2 33 (H) 11/30/2019   TSH 2.80 11/30/2019   INR 1.00 03/22/2011   HGBA1C 5.8 11/30/2019    Lab Results  Component Value Date   TSH 2.80 11/30/2019   Lab Results  Component Value Date   WBC 9.0 11/30/2019   HGB 13.1 11/30/2019   HCT 39.4 11/30/2019   MCV 93.5 11/30/2019   PLT 251.0 11/30/2019   Lab Results  Component Value Date  NA 141 11/30/2019   K 4.4 11/30/2019   CO2 33 (H) 11/30/2019   GLUCOSE 103 (H) 11/30/2019   BUN 14 11/30/2019   CREATININE 1.03 11/30/2019   BILITOT 0.3 11/30/2019   ALKPHOS 60 11/30/2019   AST 21 11/30/2019   ALT 29 11/30/2019   PROT 5.9 (L) 11/30/2019   ALBUMIN 4.0 11/30/2019   CALCIUM 9.2 11/30/2019   ANIONGAP 11 03/20/2018   GFR 51.59 (L) 11/30/2019   Lab Results  Component Value Date   CHOL 146 09/12/2016   Lab Results  Component Value Date   HDL 102.30 09/12/2016   Lab Results  Component Value Date   LDLCALC 38 09/12/2016   Lab Results  Component Value Date   TRIG 26.0 09/12/2016   Lab Results  Component Value Date   CHOLHDL 1 09/12/2016   Lab Results  Component Value  Date   HGBA1C 5.8 11/30/2019       Assessment & Plan:   Problem List Items Addressed This Visit    Vitamin D deficiency    Supplement and monitor      Relevant Orders   VITAMIN D 25 Hydroxy (Vit-D Deficiency, Fractures)   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 She does acknowledge it has been hard to remember to use.       Osteoporosis    Encouraged to get adequate exercise, calcium and vitamin d intake. She declines meds at this time. She is asked to continue Prolia      Preventative health care    Patient encouraged to maintain heart healthy diet, regular exercise, adequate sleep. Consider daily probiotics. Take medications as prescribed. Labs ordered and reviewed. Last [facility] January 2021. Has one scheduled for next month. She has had the first two COVID shots but has chosen not to have the 3 rd shot. She is advised that she is at increased risk for severe infection if she does not boost.       Relevant Orders   CBC   TSH   Elevated alkaline phosphatase level - Primary   Relevant Orders   Comprehensive metabolic panel    Other Visit Diagnoses    Hyperglycemia       Relevant Orders   Hemoglobin A1c      I am having [name] maintain her vitamin C, Vitamin D3, cetirizine, Probiotic Product (PROBIOTIC PO), Cranberry, TURMERIC PO, fluticasone, b complex vitamins, triamcinolone, estradiol, colestipol, pantoprazole, diphenoxylate-atropine, escitalopram, and Creon.  No orders of the defined types were placed in this encounter.    Penni Homans, MD

## 2020-06-05 LAB — HM MAMMOGRAPHY

## 2020-06-08 DIAGNOSIS — R922 Inconclusive mammogram: Secondary | ICD-10-CM

## 2020-06-13 ENCOUNTER — Encounter: Payer: Self-pay | Admitting: Family Medicine

## 2020-06-13 LAB — HM MAMMOGRAPHY

## 2020-06-14 ENCOUNTER — Encounter: Payer: Self-pay | Admitting: *Deleted

## 2020-06-30 ENCOUNTER — Other Ambulatory Visit: Payer: Self-pay | Admitting: Family Medicine

## 2020-07-04 ENCOUNTER — Encounter: Payer: Self-pay | Admitting: *Deleted

## 2020-07-12 ENCOUNTER — Other Ambulatory Visit: Payer: Self-pay

## 2020-07-12 ENCOUNTER — Other Ambulatory Visit (INDEPENDENT_AMBULATORY_CARE_PROVIDER_SITE_OTHER): Payer: Medicare Other

## 2020-07-12 ENCOUNTER — Encounter: Payer: Self-pay | Admitting: Internal Medicine

## 2020-07-12 ENCOUNTER — Ambulatory Visit (INDEPENDENT_AMBULATORY_CARE_PROVIDER_SITE_OTHER): Payer: Medicare Other | Admitting: Internal Medicine

## 2020-07-12 VITALS — BP 126/82 | HR 80 | Ht 64.0 in | Wt 124.0 lb

## 2020-07-12 DIAGNOSIS — K219 Gastro-esophageal reflux disease without esophagitis: Secondary | ICD-10-CM

## 2020-07-12 DIAGNOSIS — K861 Other chronic pancreatitis: Secondary | ICD-10-CM

## 2020-07-12 DIAGNOSIS — K760 Fatty (change of) liver, not elsewhere classified: Secondary | ICD-10-CM | POA: Diagnosis not present

## 2020-07-12 DIAGNOSIS — K8689 Other specified diseases of pancreas: Secondary | ICD-10-CM

## 2020-07-12 DIAGNOSIS — K580 Irritable bowel syndrome with diarrhea: Secondary | ICD-10-CM

## 2020-07-12 DIAGNOSIS — Z86010 Personal history of colonic polyps: Secondary | ICD-10-CM

## 2020-07-12 LAB — COMPREHENSIVE METABOLIC PANEL
ALT: 15 U/L (ref 0–35)
AST: 17 U/L (ref 0–37)
Albumin: 4 g/dL (ref 3.5–5.2)
Alkaline Phosphatase: 54 U/L (ref 39–117)
BUN: 15 mg/dL (ref 6–23)
CO2: 32 mEq/L (ref 19–32)
Calcium: 9.2 mg/dL (ref 8.4–10.5)
Chloride: 102 mEq/L (ref 96–112)
Creatinine, Ser: 1.03 mg/dL (ref 0.40–1.20)
GFR: 51.39 mL/min — ABNORMAL LOW (ref >60.00)
Glucose, Bld: 89 mg/dL (ref 70–99)
Potassium: 4 mEq/L (ref 3.5–5.1)
Sodium: 140 mEq/L (ref 135–145)
Total Bilirubin: 0.3 mg/dL (ref 0.2–1.2)
Total Protein: 6.8 g/dL (ref 6.0–8.3)

## 2020-07-12 LAB — CBC WITH DIFFERENTIAL/PLATELET
Basophils Absolute: 0 10*3/uL (ref 0.0–0.1)
Basophils Relative: 0.3 % (ref 0.0–3.0)
Eosinophils Absolute: 0.1 10*3/uL (ref 0.0–0.7)
Eosinophils Relative: 1 % (ref 0.0–5.0)
HCT: 40.1 % (ref 36.0–46.0)
Hemoglobin: 13.7 g/dL (ref 12.0–15.0)
Lymphocytes Relative: 23.4 % (ref 12.0–46.0)
Lymphs Abs: 1.7 10*3/uL (ref 0.7–4.0)
MCHC: 34.1 g/dL (ref 30.0–36.0)
MCV: 90.5 fl (ref 78.0–100.0)
Monocytes Absolute: 0.6 10*3/uL (ref 0.1–1.0)
Monocytes Relative: 7.5 % (ref 3.0–12.0)
Neutro Abs: 5 10*3/uL (ref 1.4–7.7)
Neutrophils Relative %: 67.8 % (ref 43.0–77.0)
Platelets: 274 10*3/uL (ref 150.0–400.0)
RBC: 4.44 Mil/uL (ref 3.87–5.11)
RDW: 13.9 % (ref 11.5–15.5)
WBC: 7.4 10*3/uL (ref 4.0–10.5)

## 2020-07-12 LAB — TSH: TSH: 3.03 u[IU]/mL (ref 0.35–4.50)

## 2020-07-12 LAB — HEMOGLOBIN A1C: Hgb A1c MFr Bld: 5.8 % (ref 4.6–6.5)

## 2020-07-12 MED ORDER — COLESTIPOL HCL 1 G PO TABS: 1.0000 g | ORAL_TABLET | Freq: Every evening | ORAL | 9 refills | Status: DC

## 2020-07-12 MED ORDER — DIPHENOXYLATE-ATROPINE 2.5-0.025 MG PO TABS: 1.0000 | ORAL_TABLET | Freq: Four times a day (QID) | ORAL | 1 refills | Status: DC | PRN

## 2020-07-12 MED ORDER — CREON 24000-76000 UNITS PO CPEP: ORAL_CAPSULE | ORAL | 0 refills | Status: DC

## 2020-07-12 MED ORDER — PANTOPRAZOLE SODIUM 40 MG PO TBEC: 40.0000 mg | DELAYED_RELEASE_TABLET | Freq: Every day | ORAL | 1 refills | Status: DC

## 2020-07-12 NOTE — Patient Instructions (Signed)
Continue Creon, lomotil and pantoprazole at current dosages.  We have sent the following medications to your pharmacy for you to pick up at your convenience: Colestipol ([facility])  You will be due for a recall colonoscopy in 01/2021. We will send you a reminder in the mail when it gets closer to that time.  Your provider has requested that you go to the basement level for lab work before leaving today. Press "B" on the elevator. The lab is located at the first door on the left as you exit the elevator.  Please follow up with Dr Hilarie Fredrickson in 1 year.  If you are age 19 or older, your body mass index should be between 23-30. Your Body mass index is 21.28 kg/m. If this is out of the aforementioned range listed, please consider follow up with your Primary Care Provider.  If you are age 22 or younger, your body mass index should be between 19-25. Your Body mass index is 21.28 kg/m. If this is out of the aformentioned range listed, please consider follow up with your Primary Care Provider.   Due to recent changes in healthcare laws, you may see the results of your imaging and laboratory studies on MyChart before your provider has had a chance to review them.  We understand that in some cases there may be results that are confusing or concerning to you. Not all laboratory results come back in the same time frame and the provider may be waiting for multiple results in order to interpret others.  Please give [place] 48 hours in order for your provider to thoroughly review all the results before contacting the office for clarification of your results.

## 2020-07-12 NOTE — Progress Notes (Signed)
   Subjective:    Patient ID: [name], female    DOB: [birth year], 80 y.o.   MRN: [id number]  HPI [name] is an 80 year old female with history of chronic autoimmune hepatitis with pancreatic insufficiency, IBS with loose stool predominance, GERD, hepatic steatosis, history of adenomatous colon polyps, prior internal hemorrhoids treated successfully with banding who is here for follow-up.  I last saw her in November 2021.  She reports she has been doing quite well recently.  She has started colestipol and is found this to be definitively helpful with her loose stools particularly after her most fatty meals.  She will have occasional diarrhea particularly when she eats out and particularly after breakfast.  Sausage seems to bother her the most.  Bowel movements are never hard and usually soft and usually once per day when she has a diarrheal episode.  She has used Lomotil successfully as well.  No blood in stool or melena.  Reflux only occasionally flares and she is continue pantoprazole daily.  She will occasionally use Gaviscon chews.  Her Creon continues with meals and snacks.   Review of Systems As per HPI, otherwise negative  Current Medications, Allergies, Past Medical History, Past Surgical History, Family History and Social History were reviewed in [employer] record.     Objective:   Physical Exam BP 126/82   Pulse 80   Ht 5\' 4"  (1.626 m)   Wt 124 lb (56.2 kg)   BMI 21.28 kg/m  Gen: awake, alert, NAD HEENT: anicteric  CV: RRR, no mrg Pulm: CTA b/l Abd: soft, NT/ND, +BS throughout Ext: no c/c/e Neuro: nonfocal     Assessment & Plan:  80 year old female with history of chronic autoimmune hepatitis with pancreatic insufficiency, IBS with loose stool predominance, GERD, hepatic steatosis, history of adenomatous colon polyps, prior internal hemorrhoids treated successfully with banding who is here for follow-up.   1.  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  Last pancreatic imaging was reassuring without any evidence for concerning lesion or malignancy.  We will continue pancreatic enzyme replacement --continue Creon 6 capsules with meals, 1-3 with snacks  2.  Chronic loose stools --in part associated with #1 but also secondary to irritable bowel type response.  Colestipol has helped and we will continue.  She can continue Lomotil 1 to 2 tablets 4 times daily as needed --Continue colestipol 1 g daily, separate this from other meds Bitely 60 minutes --Lomotil 1 to 2 tablets 4 times daily as needed  3.  History of fatty liver --previously normal enzymes though they are being rechecked today.  No evidence for advanced liver disease or cirrhosis --I have recommended Vitamin E 800 IU daily  4.  GERD and dyspepsia --doing well with pantoprazole.  She does use Gaviscon as needed.  No alarm symptoms. --Pantoprazole 40 mg daily  5.  History of multiple adenomatous colon polyps --last colonoscopy 3 years ago in September.  She is interested in repeating surveillance colonoscopy and given her general wellbeing at age 81 I would recommend we proceed with this surveillance exam later this year. --Colonoscopy for surveillance around September 2022  Office follow-up 6 to 12 months, sooner if needed

## 2020-07-18 LAB — VITAMIN D 1,25 DIHYDROXY
Vitamin D 1, 25 (OH)2 Total: 40 pg/mL (ref 18–72)
Vitamin D2 1, 25 (OH)2: <8 pg/mL
Vitamin D3 1, 25 (OH)2: 40 pg/mL

## 2020-08-15 ENCOUNTER — Telehealth: Payer: Self-pay | Admitting: *Deleted

## 2020-08-15 NOTE — Telephone Encounter (Signed)
Request Reference Number: [id number]. DOTTI DIS 0.025MG  is approved through 05/26/2021.  Cover my meds Key: [id number]

## 2020-09-06 ENCOUNTER — Other Ambulatory Visit: Payer: Self-pay | Admitting: Internal Medicine

## 2020-10-05 ENCOUNTER — Telehealth (INDEPENDENT_AMBULATORY_CARE_PROVIDER_SITE_OTHER): Payer: Medicare Other | Admitting: Family

## 2020-10-05 ENCOUNTER — Other Ambulatory Visit: Payer: Self-pay

## 2020-10-05 DIAGNOSIS — J019 Acute sinusitis, unspecified: Secondary | ICD-10-CM

## 2020-10-05 MED ORDER — CEFDINIR 300 MG PO CAPS
300.0000 mg | ORAL_CAPSULE | Freq: Two times a day (BID) | ORAL | 0 refills | Status: DC
Start: 2020-10-05 — End: 2021-01-15

## 2020-10-05 NOTE — Progress Notes (Signed)
[name] is a 80 y.o. female with the following history as recorded in EpicCare:  Patient Active Problem List   Diagnosis Date Noted  . Elevated alkaline phosphatase level 12/01/2019  . Fatty liver 11/30/2019  . Post herpetic neuralgia 09/01/2018  . Contact dermatitis 10/13/2017  . Hand injury, left, initial encounter 10/13/2017  . Estrogen deficiency 10/15/2016  . 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 Post-menopause 09/26/2013  . Preventative health care 04/04/2013  . Osteoporosis 04/01/2012  . Allergy 12/31/2011  . Elevated blood pressure 04/16/2011  . Fibrocystic breast   . UTI (lower urinary tract infection) 03/13/2011  . 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 Prednisone adverse reaction 11/16/2010  . Inner ear disease 11/16/2010  . Pain in ear 11/09/2010  . Eustachian tube dysfunction 11/09/2010  . IBS (irritable bowel syndrome) 09/27/2010  . HYPOTENSION, ORTHOSTATIC 02/26/2010  . PALPITATIONS, RECURRENT 02/26/2010  . Full incontinence of feces 06/20/2009  . TOBACCO USE, QUIT 03/29/2009  . CHEST PAIN 12/13/2008  . Abdominal pain, epigastric 12/13/2008  . CONSTIPATION 01/12/2008  . SHOULDER PAIN 12/29/2007  . Anxiety state 08/29/2007  . Vitamin D deficiency 05/01/2007  . Hemorrhoids 05/01/2007  . GERD 05/01/2007  . 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  . OSTEOARTHRITIS 01/10/2007    Current Outpatient Medications  Medication Sig Dispense Refill  . Ascorbic Acid (VITAMIN C) 500 MG tablet Take 500 mg by mouth daily.    Marland Kitchen b complex vitamins tablet Take 1 tablet by mouth daily.    . cefdinir (OMNICEF) 300 MG capsule Take 1 capsule (300 mg total) by mouth 2 (two) times daily. 20 capsule 0  . cetirizine (ZYRTEC) 10 MG tablet Take 1 tablet (10 mg total) by mouth daily. 90 tablet 3  . Cholecalciferol (VITAMIN D3) 1000 UNITS CAPS Take 1 tablet by mouth daily.    . colestipol (COLESTID) 1 g tablet Take 1 tablet (1 g total) by mouth every evening. Take 1 hour apart from all other  medications 30 tablet 9  . Cranberry 500 MG CAPS Take 1 capsule by mouth daily.    . diphenoxylate-atropine (LOMOTIL) 2.5-0.025 MG tablet Take 1 tablet by mouth 4 (four) times daily as needed for diarrhea or loose stools. 360 tablet 1  . DOTTI 0.025 MG/24HR APPLY 1 PATCH TOPICALLY TO  SKIN TWICE WEEKLY 24 patch 3  . escitalopram (LEXAPRO) 10 MG tablet Take 1 tablet (10 mg total) by mouth daily. 90 tablet 3  . fluticasone (FLONASE) 50 MCG/ACT nasal spray Place 2 sprays into both nostrils as needed for rhinitis or allergies. 48 g 3  . Pancrelipase, Lip-Prot-Amyl, (CREON) 24000-76000 units CPEP TAKE 6 CAPSULES BY MOUTH  WITH MEALS AND 1 TO 3 CAPS  WITH EACH SNACK. IF EATING  HIGH FAT ITEMS, TAKE 8 CAPS WITH MEALS. MAX 30 CAPS/DAY. MUST HAVE OFFICE VISIT FOR FURTHER REFILLS 2700 capsule 1  . pantoprazole (PROTONIX) 40 MG tablet Take 1 tablet (40 mg total) by mouth daily. 90 tablet 1  . Probiotic Product (PROBIOTIC PO) Take by mouth. Takes Probaclac probiotic once daily    . triamcinolone cream (KENALOG) 0.1 % Apply 1 application topically 2 (two) times daily. 80 g 01  . TURMERIC PO Take by mouth daily.     No current facility-administered medications for this visit.    Allergies: Amoxicillin, Azithromycin, Ciprofloxacin, Epinephrine, Penicillins, Sulfonamide derivatives, and Tamiflu [oseltamivir phosphate]  Past Medical History:  Diagnosis Date  . Adenomatous colon polyp   . Allergic state   . Allergy   .  ANXIETY, CHRONIC   . 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 CHEST PAIN    LHC 02/2011: normal cors, EF 65%  . Contact dermatitis 10/13/2017  . Diverticulosis   . Esophageal reflux   . Fatty liver   . Fibrocystic breast   . Hand injury, left, initial encounter 10/13/2017  . Hepatic cyst   . Hepatic steatosis   . History of shingles   . HYPOTENSION, ORTHOSTATIC   . IBS (irritable bowel syndrome)   . Internal hemorrhoids without mention of complication   . Kidney lesion   . Kidney  stone   . Liver cyst   . Osteoarthritis   . Osteopenia   . Osteopenia   . Osteoporosis   . Pain in ear   . PALPITATIONS, RECURRENT   . Pancreatitis chronic   . Preventative health care   . PSVT   . 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 SHOULDER PAIN   . Syncope and collapse    broke a rib, and a vertabre  . TOBACCO USE, QUIT   . VITAMIN D DEFICIENCY     Past Surgical History:  Procedure Laterality Date  . ABDOMINAL HYSTERECTOMY  1998  . BREAST SURGERY     breast bx on left, benign  . CARDIAC CATHETERIZATION    . CATARACT EXTRACTION, BILATERAL    . CATARACT EXTRACTION, BILATERAL    . CHOLECYSTECTOMY  1999  . COLONOSCOPY    . EYE SURGERY Bilateral 04/2017, 05/2017   cataracts  . [place]  . TUBAL LIGATION     with D&C    Family History  Problem Relation Age of Onset  . Hypertension Mother   . Stroke Mother   . Alcohol abuse Mother   . Heart disease Father   . Bladder Cancer Father   . Diabetes Father   . Hypothyroidism Father   . Dementia Father   . Uterine cancer Paternal Grandmother   . Heart disease Sister        rheumatic fever at 6 months, cardiomegaly MI  . Heart disease Maternal Grandmother   . Stroke Maternal Grandmother   . Heart disease Maternal Grandfather   . Stroke Maternal Grandfather   . Breast cancer Cousin   . Pancreatic cancer Paternal Aunt   . Stomach cancer Paternal Uncle   . Breast cancer Paternal Aunt   . Lung cancer Paternal Uncle   . Crohn's disease Son   . Colon cancer Neg Hx   . Colon polyps Neg Hx   . Esophageal cancer Neg Hx   . Rectal cancer Neg Hx     Social History   Tobacco Use  . Smoking status: Former Smoker    Types: Cigarettes    Quit date: 05/27/1973    Years since quitting: 47.3  . Smokeless tobacco: Never Used  Substance Use Topics  . Alcohol use: No    Subjective:   I connected with [name] on 10/05/20 at  3:40 PM EDT by a video enabled telemedicine application and verified that I am  speaking with the correct person using two identifiers.   I discussed the limitations of evaluation and management by telemedicine and the availability of in person appointments. The patient expressed understanding and agreed to proceed. Provider in office/ patient is at home; provider and patient are only 2 people on video call.    1 week history of sinus pain/ pressure; does have underlying allergic rhinitis; on allergy medication and Flonase; no fever,  chest pain or shortness of breath; symptoms more localized on left side of face;     Objective:  Vitals:    General: Well developed, well nourished, in no acute distress  Head: Normocephalic and atraumatic  Lungs: Respirations unlabored; Neurologic: Alert and oriented; speech intact; face symmetrical;   Assessment:  1. Acute sinusitis, recurrence not specified, unspecified location     Plan:  Rx for Omnicef 300 mg bid x 10 days; discussed that she has taken this medication in the past with no allergic reaction; hesitant to use Doxycycline due to concern that this antibiotic caused her autoimmune pancreatic disease;  Increase fluids, rest and follow up worse, no better.      No follow-ups on file.  No orders of the defined types were placed in this encounter.   Requested Prescriptions   Signed Prescriptions Disp Refills  . cefdinir (OMNICEF) 300 MG capsule 20 capsule 0    Sig: Take 1 capsule (300 mg total) by mouth 2 (two) times daily.

## 2020-11-29 ENCOUNTER — Other Ambulatory Visit: Payer: Self-pay | Admitting: Internal Medicine

## 2021-01-14 NOTE — Progress Notes (Signed)
PCP:  Mosie Lukes, MD Primary Cardiologist: None Electrophysiologist: Virl Axe, MD   [name] is a 80 y.o. female seen today for Virl Axe, MD for routine electrophysiology followup.  Since last being seen in our clinic the patient reports doing very well. She has not had orthostasis in some time. She exercises up to 4 times a week, and has been conservative with being out in the heat.   she denies chest pain, palpitations, dyspnea, PND, orthopnea, nausea, vomiting, dizziness, syncope, edema, weight gain, or early satiety.  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[place])    CHEST PAIN    LHC 02/2011: normal cors, EF 65%   Contact dermatitis 10/13/2017   Diverticulosis    Esophageal reflux    Fatty liver    Fibrocystic breast    Hand injury, left, initial encounter 10/13/2017   Hepatic cyst    Hepatic steatosis    History of shingles    HYPOTENSION, ORTHOSTATIC    IBS (irritable bowel syndrome)    Internal hemorrhoids without mention of complication    Kidney lesion    Kidney stone    Liver cyst    Osteoarthritis    Osteopenia    Osteopenia    Osteoporosis    Pain in ear    PALPITATIONS, RECURRENT    Pancreatitis chronic    Preventative health care    PSVT    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   SHOULDER PAIN    Syncope and collapse    broke a rib, and a vertabre   TOBACCO USE, QUIT    VITAMIN D DEFICIENCY    Past Surgical History:  Procedure Laterality Date   ABDOMINAL HYSTERECTOMY  1998   BREAST SURGERY     breast bx on left, benign   CARDIAC CATHETERIZATION     CATARACT EXTRACTION, BILATERAL     CATARACT EXTRACTION, BILATERAL     CHOLECYSTECTOMY  1999   COLONOSCOPY     EYE SURGERY Bilateral 04/2017, 05/2017   cataracts   KNEE SURGERY  1958   TUBAL LIGATION     with D&C    Current Outpatient Medications  Medication Sig  Dispense Refill   Ascorbic Acid (VITAMIN C) 500 MG tablet Take 500 mg by mouth daily.     Azelaic Acid 15 % gel Apply 1 application topically at bedtime.     b complex vitamins tablet Take 1 tablet by mouth daily.     cetirizine (ZYRTEC) 10 MG tablet Take 1 tablet (10 mg total) by mouth daily. 90 tablet 3   Cholecalciferol (VITAMIN D3) 1000 UNITS CAPS Take 1 tablet by mouth daily.     ciclopirox (PENLAC) 8 % solution at bedtime.     colestipol (COLESTID) 1 g tablet Take 1 tablet (1 g total) by mouth every evening. Take 1 hour apart from all other medications 30 tablet 9   Cranberry 500 MG CAPS Take 1 capsule by mouth daily.     diphenoxylate-atropine (LOMOTIL) 2.5-0.025 MG tablet Take 1 tablet by mouth 4 (four) times daily as needed for diarrhea or loose stools. 360 tablet 1   DOTTI 0.025 MG/24HR APPLY 1 PATCH TOPICALLY TO  SKIN TWICE WEEKLY 24 patch 3   escitalopram (LEXAPRO) 10 MG tablet Take 1 tablet (10 mg total) by mouth daily. 90 tablet 3   fluticasone (  FLONASE) 50 MCG/ACT nasal spray Place 2 sprays into both nostrils as needed for rhinitis or allergies. 48 g 3   Pancrelipase, Lip-Prot-Amyl, (CREON) 24000-76000 units CPEP TAKE 6 CAPSULES BY MOUTH  WITH MEALS AND 1 TO 3 CAPS  WITH EACH SNACK. IF EATING  HIGH FAT ITEMS, TAKE 8 CAPS WITH MEALS. MAX 30 CAPS/DAY. MUST HAVE OFFICE VISIT FOR FURTHER REFILLS 2700 capsule 1   pantoprazole (PROTONIX) 40 MG tablet TAKE 1 TABLET BY MOUTH  DAILY 90 tablet 0   Probiotic Product (PROBIOTIC PO) Take by mouth. Takes Probaclac probiotic once daily     tretinoin (RETIN-A) 0.025 % cream Apply 1 application topically 4 (four) times a week.     TURMERIC PO Take by mouth daily.     No current facility-administered medications for this visit.    Allergies  Allergen Reactions   Amoxicillin     REACTION: unspecified   Azithromycin     REACTION: Rash   Ciprofloxacin     REACTION: GI upset   Epinephrine     REACTION: unspecified   Penicillins      REACTION: rash   Sulfonamide Derivatives     REACTION: sick to stomach   Tamiflu [Oseltamivir Phosphate] Hives    Social History   Socioeconomic History   Marital status: Married    Spouse name: Not on file   Number of children: 1   Years of education: Not on file   Highest education level: Not on file  Occupational History   Occupation: Retired    Fish farm manager: RETIRED  Tobacco Use   Smoking status: Former    Types: Cigarettes    Quit date: 05/27/1973    Years since quitting: 47.6   Smokeless tobacco: Never  Vaping Use   Vaping Use: Never used  Substance and Sexual Activity   Alcohol use: No   Drug use: No   Sexual activity: Not on file  Other Topics Concern   Not on file  Social History Narrative   Not on file   Social Determinants of Health   Financial Resource Strain: Not on file  Food Insecurity: Not on file  Transportation Needs: Not on file  Physical Activity: Not on file  Stress: Not on file  Social Connections: Not on file  Intimate Partner Violence: Not on file     Review of Systems: All other systems reviewed and are otherwise negative except as noted above.  Physical Exam: Vitals:   01/15/21 1227  BP: 140/78  Pulse: 74  SpO2: 96%  Weight: 126 lb (57.2 kg)  Height: 5' 4.5" (1.638 m)    GEN- The patient is well appearing, alert and oriented x 3 today.   HEENT: normocephalic, atraumatic; sclera clear, conjunctiva pink; hearing intact; oropharynx clear; neck supple, no JVP Lymph- no cervical lymphadenopathy Lungs- Clear to ausculation bilaterally, normal work of breathing.  No wheezes, rales, rhonchi Heart- Regular rate and rhythm, no murmurs, rubs or gallops, PMI not laterally displaced GI- soft, non-tender, non-distended, bowel sounds present, no hepatosplenomegaly Extremities- no clubbing, cyanosis, or edema; DP/PT/radial pulses 2+ bilaterally MS- no significant deformity or atrophy Skin- warm and dry, no rash or lesion Psych- euthymic mood,  full affect Neuro- strength and sensation are intact  EKG is not ordered today.   Additional studies reviewed include: Previous EP office notes.   Assessment and Plan:  1. A tach No symptoms to suggest recurrence  2. HTN -> Orthostatic hypotension We discussed the role of salt and water repletion with  her IBS and the awareness of triggers and the role of ambient heat and dehydration She is overall stable.   Shirley Friar, PA-C  01/15/21 12:31 PM

## 2021-01-15 ENCOUNTER — Ambulatory Visit: Payer: Medicare Other | Admitting: Physician Assistant

## 2021-01-15 ENCOUNTER — Other Ambulatory Visit: Payer: Self-pay

## 2021-01-15 ENCOUNTER — Ambulatory Visit (INDEPENDENT_AMBULATORY_CARE_PROVIDER_SITE_OTHER): Payer: Medicare Other | Admitting: Student

## 2021-01-15 ENCOUNTER — Encounter: Payer: Self-pay | Admitting: Student

## 2021-01-15 VITALS — BP 140/78 | HR 74 | Ht 64.5 in | Wt 126.0 lb

## 2021-01-15 DIAGNOSIS — I471 Supraventricular tachycardia: Secondary | ICD-10-CM

## 2021-01-15 DIAGNOSIS — I951 Orthostatic hypotension: Secondary | ICD-10-CM

## 2021-01-15 NOTE — Patient Instructions (Signed)
Medication Instructions:  Your physician recommends that you continue on your current medications as directed. Please refer to the Current Medication list given to you today.  *If you need a refill on your cardiac medications before your next appointment, please call your pharmacy*   Lab Work: None If you have labs (blood work) drawn today and your tests are completely normal, you will receive your results only by: [place] (if you have MyChart) OR A paper copy in the mail If you have any lab test that is abnormal or we need to change your treatment, we will call you to review the results.  Follow-Up: At [facility], you and your health needs are our priority.  As part of our continuing mission to provide you with exceptional heart care, we have created designated Provider Care Teams.  These Care Teams include your primary Cardiologist (physician) and Advanced Practice Providers (APPs -  Physician Assistants and Nurse Practitioners) who all work together to provide you with the care you need, when you need it.  Your next appointment:   1 year(s)  The format for your next appointment:   In Person  Provider:   You may see Virl Axe, MD or one of the following Advanced Practice Providers on your designated Care Team:   Tommye Standard, [place] "[facility]" [place], [place]

## 2021-02-20 ENCOUNTER — Other Ambulatory Visit: Payer: Self-pay | Admitting: Internal Medicine

## 2021-03-29 ENCOUNTER — Telehealth: Payer: Self-pay | Admitting: Internal Medicine

## 2021-03-29 NOTE — Telephone Encounter (Signed)
Patient called wanting to confirm if she still needs to schedule her recall or just follow up within a year.

## 2021-03-30 NOTE — Telephone Encounter (Signed)
Pt needs to schedule recall colon, it was due in September.

## 2021-04-02 ENCOUNTER — Encounter: Payer: Self-pay | Admitting: Internal Medicine

## 2021-04-02 NOTE — Telephone Encounter (Signed)
Called patient to schedule left voicemail. 

## 2021-04-02 NOTE — Telephone Encounter (Signed)
PV 1/5 LEC 1/19

## 2021-04-04 ENCOUNTER — Telehealth: Payer: Self-pay

## 2021-04-04 NOTE — Telephone Encounter (Signed)
Pt scheduled to see Dr. Hilarie Fredrickson 05/15/21 at 3:20pm. Left message for pt to call back.

## 2021-04-04 NOTE — Telephone Encounter (Signed)
-----   Message from Jerene Bears, MD sent at 04/03/2021  5:47 PM EST ----- Regarding: RE: Recall colon I had discussed this with her at last office visit at which point she was wanting to proceed. Given that it has been about a year, office visit first is not unreasonable Thanks JMP ----- Message ----- From: Algernon Huxley, RN Sent: 04/02/2021   3:54 PM EST To: Jerene Bears, MD Subject: Recall colon                                   This pt was due recall colon in September. I wanted to make sure you were ok just doing the previsit and recall colon and not an OV.  Please let me know. Thanks, [employer]

## 2021-04-06 NOTE — Telephone Encounter (Signed)
Appt letter mailed to pt. 

## 2021-04-17 ENCOUNTER — Ambulatory Visit (INDEPENDENT_AMBULATORY_CARE_PROVIDER_SITE_OTHER): Payer: Medicare Other

## 2021-04-17 VITALS — Ht 65.0 in | Wt 126.0 lb

## 2021-04-17 DIAGNOSIS — Z0000 Encounter for general adult medical examination without abnormal findings: Secondary | ICD-10-CM

## 2021-04-17 NOTE — Patient Instructions (Signed)
[name] , Thank you for taking time to complete your Medicare Wellness Visit. I appreciate your ongoing commitment to your health goals. Please review the following plan we discussed and let me know if I can assist you in the future.   Screening recommendations/referrals: Colonoscopy: No longer required Mammogram: Completed 06/05/2020-Scheduled for 05/2021 Bone Density: Completed 05/31/2019-Scheduled for 05/2021 Recommended yearly ophthalmology/optometry visit for glaucoma screening and checkup Recommended yearly dental visit for hygiene and checkup  Vaccinations: Influenza vaccine: Due-May obtain vaccine at our office or your local pharmacy. Pneumococcal vaccine: Up to date Tdap vaccine: Up to date Shingles vaccine: Discuss with pharmacy   Covid-19:Declined booster  Advanced directives: Please bring a copy of Living Will and/or Healthcare Power of Attorney for your chart.   Conditions/risks identified: See problem list  Next appointment: Follow up in one year for your annual wellness visit    Preventive Care 65 Years and Older, Female Preventive care refers to lifestyle choices and visits with your health care provider that can promote health and wellness. What does preventive care include? A yearly physical exam. This is also called an annual well check. Dental exams once or twice a year. Routine eye exams. Ask your health care provider how often you should have your eyes checked. Personal lifestyle choices, including: Daily care of your teeth and gums. Regular physical activity. Eating a healthy diet. Avoiding tobacco and drug use. Limiting alcohol use. Practicing safe sex. Taking low-dose aspirin every day. Taking vitamin and mineral supplements as recommended by your health care provider. What happens during an annual well check? The services and screenings done by your health care provider during your annual well check will depend on your age, overall health, lifestyle  risk factors, and family history of disease. Counseling  Your health care provider may ask you questions about your: Alcohol use. Tobacco use. Drug use. Emotional well-being. Home and relationship well-being. Sexual activity. Eating habits. History of falls. Memory and ability to understand (cognition). Work and work Statistician. Reproductive health. Screening  You may have the following tests or measurements: Height, weight, and BMI. Blood pressure. Lipid and cholesterol levels. These may be checked every 5 years, or more frequently if you are over 40 years old. Skin check. Lung cancer screening. You may have this screening every year starting at age 14 if you have a 30-pack-year history of smoking and currently smoke or have quit within the past 15 years. Fecal occult blood test (FOBT) of the stool. You may have this test every year starting at age 59. Flexible sigmoidoscopy or colonoscopy. You may have a sigmoidoscopy every 5 years or a colonoscopy every 10 years starting at age 17. Hepatitis C blood test. Hepatitis B blood test. Sexually transmitted disease (STD) testing. Diabetes screening. This is done by checking your blood sugar (glucose) after you have not eaten for a while (fasting). You may have this done every 1-3 years. Bone density scan. This is done to screen for osteoporosis. You may have this done starting at age 31. Mammogram. This may be done every 1-2 years. Talk to your health care provider about how often you should have regular mammograms. Talk with your health care provider about your test results, treatment options, and if necessary, the need for more tests. Vaccines  Your health care provider may recommend certain vaccines, such as: Influenza vaccine. This is recommended every year. Tetanus, diphtheria, and acellular pertussis (Tdap, Td) vaccine. You may need a Td booster every 10 years. Zoster vaccine. You may  need this after age 22. Pneumococcal  13-valent conjugate (PCV13) vaccine. One dose is recommended after age 21. Pneumococcal polysaccharide (PPSV23) vaccine. One dose is recommended after age 31. Talk to your health care provider about which screenings and vaccines you need and how often you need them. This information is not intended to replace advice given to you by your health care provider. Make sure you discuss any questions you have with your health care provider. Document Released: 06/09/2015 Document Revised: 01/31/2016 Document Reviewed: 03/14/2015 Elsevier Interactive Patient Education  2017 [place] Prevention in the Home Falls can cause injuries. They can happen to people of all ages. There are many things you can do to make your home safe and to help prevent falls. What can I do on the outside of my home? Regularly fix the edges of walkways and driveways and fix any cracks. Remove anything that might make you trip as you walk through a door, such as a raised step or threshold. Trim any bushes or trees on the path to your home. Use bright outdoor lighting. Clear any walking paths of anything that might make someone trip, such as rocks or tools. Regularly check to see if handrails are loose or broken. Make sure that both sides of any steps have handrails. Any raised decks and porches should have guardrails on the edges. Have any leaves, snow, or ice cleared regularly. Use sand or salt on walking paths during winter. Clean up any spills in your garage right away. This includes oil or grease spills. What can I do in the bathroom? Use night lights. Install grab bars by the toilet and in the tub and shower. Do not use towel bars as grab bars. Use non-skid mats or decals in the tub or shower. If you need to sit down in the shower, use a plastic, non-slip stool. Keep the floor dry. Clean up any water that spills on the floor as soon as it happens. Remove soap buildup in the tub or shower regularly. Attach  bath mats securely with double-sided non-slip rug tape. Do not have throw rugs and other things on the floor that can make you trip. What can I do in the bedroom? Use night lights. Make sure that you have a light by your bed that is easy to reach. Do not use any sheets or blankets that are too big for your bed. They should not hang down onto the floor. Have a firm chair that has side arms. You can use this for support while you get dressed. Do not have throw rugs and other things on the floor that can make you trip. What can I do in the kitchen? Clean up any spills right away. Avoid walking on wet floors. Keep items that you use a lot in easy-to-reach places. If you need to reach something above you, use a strong step stool that has a grab bar. Keep electrical cords out of the way. Do not use floor polish or wax that makes floors slippery. If you must use wax, use non-skid floor wax. Do not have throw rugs and other things on the floor that can make you trip. What can I do with my stairs? Do not leave any items on the stairs. Make sure that there are handrails on both sides of the stairs and use them. Fix handrails that are broken or loose. Make sure that handrails are as long as the stairways. Check any carpeting to make sure that it is firmly attached  to the stairs. Fix any carpet that is loose or worn. Avoid having throw rugs at the top or bottom of the stairs. If you do have throw rugs, attach them to the floor with carpet tape. Make sure that you have a light switch at the top of the stairs and the bottom of the stairs. If you do not have them, ask someone to add them for you. What else can I do to help prevent falls? Wear shoes that: Do not have high heels. Have rubber bottoms. Are comfortable and fit you well. Are closed at the toe. Do not wear sandals. If you use a stepladder: Make sure that it is fully opened. Do not climb a closed stepladder. Make sure that both sides of the  stepladder are locked into place. Ask someone to hold it for you, if possible. Clearly mark and make sure that you can see: Any grab bars or handrails. First and last steps. Where the edge of each step is. Use tools that help you move around (mobility aids) if they are needed. These include: Canes. Walkers. Scooters. Crutches. Turn on the lights when you go into a dark area. Replace any light bulbs as soon as they burn out. Set up your furniture so you have a clear path. Avoid moving your furniture around. If any of your floors are uneven, fix them. If there are any pets around you, be aware of where they are. Review your medicines with your doctor. Some medicines can make you feel dizzy. This can increase your chance of falling. Ask your doctor what other things that you can do to help prevent falls. This information is not intended to replace advice given to you by your health care provider. Make sure you discuss any questions you have with your health care provider. Document Released: 03/09/2009 Document Revised: 10/19/2015 Document Reviewed: 06/17/2014 Elsevier Interactive Patient Education  2017 [employer].

## 2021-04-17 NOTE — Progress Notes (Signed)
Subjective:   [name] is a 80 y.o. female who presents for Medicare Annual (Subsequent) preventive examination.  I connected with [place] today by telephone and verified that I am speaking with the correct person using two identifiers. Location patient: home Location provider: work Persons participating in the virtual visit: patient, Marine scientist.    I discussed the limitations, risks, security and privacy concerns of performing an evaluation and management service by telephone and the availability of in person appointments. I also discussed with the patient that there may be a patient responsible charge related to this service. The patient expressed understanding and verbally consented to this telephonic visit.    Interactive audio and video telecommunications were attempted between this provider and patient, however failed, due to patient having technical difficulties OR patient did not have access to video capability.  We continued and completed visit with audio only.  Some vital signs may be absent or patient reported.   Time Spent with patient on telephone encounter: 20 minutes   Review of Systems     Cardiac Risk Factors include: advanced age (>20men, >67 women)     Objective:    Today's Vitals   04/17/21 1501  Weight: 126 lb (57.2 kg)  Height: 5\' 5"  (1.651 m)   Body mass index is 20.97 kg/m.  Advanced Directives 04/17/2021 10/15/2018 03/20/2018 09/12/2016 08/03/2015 08/01/2014  Does Patient Have a Medical Advance Directive? Yes Yes Yes No Yes Yes  Type of Paramedic of [place];Living will [place];Living will [place];Living will - [place];Living will [place];Living will  Does patient want to make changes to medical advance directive? - No - Patient declined - - No - Patient declined -  Copy of [place] in Chart? No - copy requested No - copy requested -  - No - copy requested No - copy requested  Would patient like information on creating a medical advance directive? - - - Yes (MAU/Ambulatory/Procedural Areas - Information given) - -    Current Medications (verified) Outpatient Encounter Medications as of 04/17/2021  Medication Sig   Ascorbic Acid (VITAMIN C) 500 MG tablet Take 500 mg by mouth daily.   Azelaic Acid 15 % gel Apply 1 application topically at bedtime.   b complex vitamins tablet Take 1 tablet by mouth daily.   cetirizine (ZYRTEC) 10 MG tablet Take 1 tablet (10 mg total) by mouth daily.   Cholecalciferol (VITAMIN D3) 1000 UNITS CAPS Take 1 tablet by mouth daily.   ciclopirox (PENLAC) 8 % solution at bedtime.   colestipol (COLESTID) 1 g tablet Take 1 tablet (1 g total) by mouth every evening. Take 1 hour apart from all other medications   Cranberry 500 MG CAPS Take 1 capsule by mouth daily.   diphenoxylate-atropine (LOMOTIL) 2.5-0.025 MG tablet Take 1 tablet by mouth 4 (four) times daily as needed for diarrhea or loose stools.   DOTTI 0.025 MG/24HR APPLY 1 PATCH TOPICALLY TO  SKIN TWICE WEEKLY   escitalopram (LEXAPRO) 10 MG tablet Take 1 tablet (10 mg total) by mouth daily.   fluticasone (FLONASE) 50 MCG/ACT nasal spray Place 2 sprays into both nostrils as needed for rhinitis or allergies.   Pancrelipase, Lip-Prot-Amyl, (CREON) 24000-76000 units CPEP TAKE 6 CAPS BY MOUTH WITH  MEALS AND 1-3 CAPS WITH  EACH SNACK. IF EATING HIGH  FAT ITEMS, TAKE 8 CAPS WITH MEALS. MAX 30 CAPS PER DAY   pantoprazole (PROTONIX)  40 MG tablet TAKE 1 TABLET BY MOUTH  DAILY   Probiotic Product (PROBIOTIC PO) Take by mouth. Takes Probaclac probiotic once daily   tretinoin (RETIN-A) 0.025 % cream Apply 1 application topically 4 (four) times a week.   TURMERIC PO Take by mouth daily.   No facility-administered encounter medications on file as of 04/17/2021.    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[place])    CHEST PAIN    LHC 02/2011: normal cors, EF 65%   Contact dermatitis 10/13/2017   Diverticulosis    Esophageal reflux    Fatty liver    Fibrocystic breast    Hand injury, left, initial encounter 10/13/2017   Hepatic cyst    Hepatic steatosis    History of shingles    HYPOTENSION, ORTHOSTATIC    IBS (irritable bowel syndrome)    Internal hemorrhoids without mention of complication    Kidney lesion    Kidney stone    Liver cyst    Osteoarthritis    Osteopenia    Osteopenia    Osteoporosis    Pain in ear    PALPITATIONS, RECURRENT    Pancreatitis chronic    Preventative health care    PSVT    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   SHOULDER PAIN    Syncope and collapse    broke a rib, and a vertabre   TOBACCO USE, QUIT    VITAMIN D DEFICIENCY    Past Surgical History:  Procedure Laterality Date   ABDOMINAL HYSTERECTOMY  1998   BREAST SURGERY     breast bx on left, benign   CARDIAC CATHETERIZATION     CATARACT EXTRACTION, BILATERAL     CATARACT EXTRACTION, BILATERAL     CHOLECYSTECTOMY  1999   COLONOSCOPY     EYE SURGERY Bilateral 04/2017, 05/2017   cataracts   KNEE SURGERY  1958   TUBAL LIGATION     with D&C   Family History  Problem Relation Age of Onset   Hypertension Mother    Stroke Mother    Alcohol abuse Mother    Heart disease Father    Bladder Cancer Father    Diabetes Father    Hypothyroidism Father    Dementia Father    Uterine cancer Paternal Grandmother    Heart disease Sister        rheumatic fever at 6 months, cardiomegaly MI   Heart disease Maternal Grandmother    Stroke Maternal Grandmother    Heart disease Maternal Grandfather    Stroke Maternal Grandfather    Breast cancer Cousin    Pancreatic cancer  Paternal Aunt    Stomach cancer Paternal Uncle    Breast cancer Paternal Aunt    Lung cancer Paternal Uncle    Crohn's disease Son    Colon cancer Neg Hx    Colon polyps Neg Hx    Esophageal cancer Neg Hx    Rectal cancer Neg Hx    Social History   Socioeconomic History   Marital status: Married    Spouse name: Not on file   Number of children: 1   Years of education: Not on file   Highest education level: Not on file  Occupational History   Occupation: Retired  Employer: RETIRED  Tobacco Use   Smoking status: Former    Types: Cigarettes    Quit date: 05/27/1973    Years since quitting: 47.9   Smokeless tobacco: Never  Vaping Use   Vaping Use: Never used  Substance and Sexual Activity   Alcohol use: No   Drug use: No   Sexual activity: Not on file  Other Topics Concern   Not on file  Social History Narrative   Not on file   Social Determinants of Health   Financial Resource Strain: Low Risk    Difficulty of Paying Living Expenses: Not hard at all  Food Insecurity: No Food Insecurity   Worried About Charity fundraiser in the Last Year: Never true   [place] in the Last Year: Never true  Transportation Needs: No Transportation Needs   Lack of Transportation (Medical): No   Lack of Transportation (Non-Medical): No  Physical Activity: Sufficiently Active   Days of Exercise per Week: 4 days   Minutes of Exercise per Session: 60 min  Stress: No Stress Concern Present   Feeling of Stress : Not at all  Social Connections: Moderately Integrated   Frequency of Communication with Friends and Family: More than three times a week   Frequency of Social Gatherings with Friends and Family: More than three times a week   Attends Religious Services: More than 4 times per year   Active Member of [employer] or Organizations: No   Attends Music therapist: Never   Marital Status: Married    Tobacco Counseling Counseling given: Not Answered   Clinical  Intake:  Pre-visit preparation completed: Yes        BMI - recorded: 20.97 Nutritional Status: BMI of 19-24  Normal Nutritional Risks: None Diabetes: No  How often do you need to have someone help you when you read instructions, pamphlets, or other written materials from your doctor or pharmacy?: 1 - Never  Diabetic?No  Interpreter Needed?: No  Information entered by :: Caroleen Hamman LPN   Activities of Daily Living In your present state of health, do you have any difficulty performing the following activities: 04/17/2021  Hearing? N  Vision? N  Difficulty concentrating or making decisions? N  Walking or climbing stairs? N  Dressing or bathing? N  Doing errands, shopping? N  Preparing Food and eating ? N  Using the Toilet? N  In the past six months, have you accidently leaked urine? N  Do you have problems with loss of bowel control? Y  Managing your Medications? N  Managing your Finances? N  Housekeeping or managing your Housekeeping? N  Some recent data might be hidden    Patient Care Team: Mosie Lukes, MD as PCP - General (Family Medicine) Deboraha Sprang, MD as PCP - Electrophysiology (Cardiology) Deboraha Sprang, MD as Consulting Physician (Cardiology) Pyrtle, Lajuan Lines, MD as Consulting Physician (Gastroenterology) Rolm Bookbinder, MD as Consulting Physician (Dermatology) Alphonsa Overall, MD as Consulting Physician (General Surgery) Melissa Montane, MD as Consulting Physician (Otolaryngology) Luberta Mutter, MD as Consulting Physician (Ophthalmology)  Indicate any recent Medical Services you may have received from other than [facility] providers in the past year (date may be approximate).     Assessment:   This is a routine wellness examination for [place].  Hearing/Vision screen Hearing Screening - Comments:: No hearing loss per patient Vision Screening - Comments:: Last eye exam-03/2021-Dr. McCuen  Dietary issues and exercise activities discussed: Current  Exercise Habits: Home  exercise routine, Type of exercise: walking;strength training/weights;stretching, Time (Minutes): 60, Frequency (Times/Week): 4, Weekly Exercise (Minutes/Week): 240, Intensity: Mild   Goals Addressed             This Visit's Progress    Healthy Lifestyle       Continue exercising       Depression Screen PHQ 2/9 Scores 04/17/2021 11/30/2019 10/15/2018 09/12/2016 08/03/2015 08/01/2014 04/04/2013  PHQ - 2 Score 0 0 0 0 0 0 0  PHQ- 9 Score - 0 - - - - -    Fall Risk Fall Risk  04/17/2021 11/30/2019 10/15/2018 09/12/2016 08/03/2015  Falls in the past year? 0 0 0 No No  Number falls in past yr: 0 - - - -  Injury with Fall? 0 - - - -  Follow up Falls prevention discussed - - - -    FALL RISK PREVENTION PERTAINING TO THE HOME:  Any stairs in or around the home? Yes  If so, are there any without handrails? No  Home free of loose throw rugs in walkways, pet beds, electrical cords, etc? Yes  Adequate lighting in your home to reduce risk of falls? Yes   ASSISTIVE DEVICES UTILIZED TO PREVENT FALLS:  Life alert? No  Use of a cane, walker or w/c? No  Grab bars in the bathroom? No  Shower chair or bench in shower? No  Elevated toilet seat or a handicapped toilet? No   TIMED UP AND GO:  Was the test performed? No . Phone visit   Cognitive Function:Normal cognitive status assessed by this Nurse Health Advisor. No abnormalities found.   MMSE - Mini Mental State Exam 09/12/2016  Orientation to time 5  Orientation to Place 5  Registration 3  Attention/ Calculation 5  Recall 3  Language- name 2 objects 2  Language- repeat 1  Language- follow 3 step command 3  Language- read & follow direction 1  Write a sentence 1  Copy design 1  Total score 30        Immunizations Immunization History  Administered Date(s) Administered   Influenza Split 04/02/2011, 04/01/2012   Influenza Whole 06/28/2005, 03/25/2008, 03/29/2009, 03/07/2010   Influenza, High Dose Seasonal PF  04/02/2013, 04/14/2014, 03/31/2015, 04/03/2017, 03/30/2018, 04/09/2019   Influenza,inj,Quad PF,6+ Mos 03/19/2016   PFIZER(Purple Top)SARS-COV-2 Vaccination 07/11/2019, 08/03/2019   Pneumococcal Conjugate-13 09/24/2013   Pneumococcal Polysaccharide-23 05/01/2007   Td 10/13/2017   Tdap 05/27/2006   Zoster, Live 09/01/2007    TDAP status: Up to date  Flu Vaccine status: Due, Education has been provided regarding the importance of this vaccine. Advised may receive this vaccine at local pharmacy or Health Dept. Aware to provide a copy of the vaccination record if obtained from local pharmacy or Health Dept. Verbalized acceptance and understanding.  Pneumococcal vaccine status: Up to date  Covid-19 vaccine status: Declined, Education has been provided regarding the importance of this vaccine but patient still declined. Advised may receive this vaccine at local pharmacy or Health Dept.or vaccine clinic. Aware to provide a copy of the vaccination record if obtained from local pharmacy or Health Dept. Verbalized acceptance and understanding.  Qualifies for Shingles Vaccine? Yes   Zostavax completed Yes   Shingrix Completed?: No.    Education has been provided regarding the importance of this vaccine. Patient has been advised to call insurance company to determine out of pocket expense if they have not yet received this vaccine. Advised may also receive vaccine at local pharmacy or Health Dept. Verbalized acceptance and  understanding.  Screening Tests Health Maintenance  Topic Date Due   Zoster Vaccines- Shingrix (1 of 2) Never done   INFLUENZA VACCINE  12/25/2020   COLONOSCOPY (Pts 45-40yrs Insurance coverage will need to be confirmed)  02/09/2021   COVID-19 Vaccine (3 - Pfizer risk series) 05/03/2021 (Originally 08/31/2019)   MAMMOGRAM  06/13/2021   TETANUS/TDAP  10/14/2027   Pneumonia Vaccine 44+ Years old  Completed   DEXA SCAN  Completed   HPV VACCINES  Aged Out    Health  Maintenance  Health Maintenance Due  Topic Date Due   Zoster Vaccines- Shingrix (1 of 2) Never done   INFLUENZA VACCINE  12/25/2020   COLONOSCOPY (Pts 45-44yrs Insurance coverage will need to be confirmed)  02/09/2021    Colorectal cancer screening: No longer required.   Mammogram status: Completed bilateral 06/05/2020. Repeat every year  Bone Density status: Completed 05/31/2019. Results reflect: Bone density results: OSTEOPOROSIS. Repeat every 2 years.  Lung Cancer Screening: (Low Dose CT Chest recommended if Age 76-80 years, 30 pack-year currently smoking OR have quit w/in 15years.) does not qualify.     Additional Screening:  Hepatitis C Screening: does not qualify  Vision Screening: Recommended annual ophthalmology exams for early detection of glaucoma and other disorders of the eye. Is the patient up to date with their annual eye exam?  Yes  Who is the provider or what is the name of the office in which the patient attends annual eye exams? Dr. Ellie Lunch   Dental Screening: Recommended annual dental exams for proper oral hygiene  Community Resource Referral / Chronic Care Management: CRR required this visit?  No   CCM required this visit?  No      Plan:     I have personally reviewed and noted the following in the patient's chart:   Medical and social history Use of alcohol, tobacco or illicit drugs  Current medications and supplements including opioid prescriptions.  Functional ability and status Nutritional status Physical activity Advanced directives List of other physicians Hospitalizations, surgeries, and ER visits in previous 12 months Vitals Screenings to include cognitive, depression, and falls Referrals and appointments  In addition, I have reviewed and discussed with patient certain preventive protocols, quality metrics, and best practice recommendations. A written personalized care plan for preventive services as well as general preventive health  recommendations were provided to patient.   Due to this being a telephonic visit, the after visit summary with patients personalized plan was offered to patient via mail or my-chart.  Patient would like to access on my-chart.   Marta Antu, LPN   11/57/2620  Nurse Health Advisor  Nurse Notes: None

## 2021-05-15 ENCOUNTER — Encounter: Payer: Self-pay | Admitting: Internal Medicine

## 2021-05-15 ENCOUNTER — Ambulatory Visit (INDEPENDENT_AMBULATORY_CARE_PROVIDER_SITE_OTHER): Payer: Medicare Other | Admitting: Internal Medicine

## 2021-05-15 VITALS — BP 124/74 | HR 50 | Ht 63.5 in | Wt 126.0 lb

## 2021-05-15 DIAGNOSIS — K8689 Other specified diseases of pancreas: Secondary | ICD-10-CM | POA: Diagnosis not present

## 2021-05-15 DIAGNOSIS — K760 Fatty (change of) liver, not elsewhere classified: Secondary | ICD-10-CM

## 2021-05-15 DIAGNOSIS — K580 Irritable bowel syndrome with diarrhea: Secondary | ICD-10-CM | POA: Diagnosis not present

## 2021-05-15 DIAGNOSIS — K861 Other chronic pancreatitis: Secondary | ICD-10-CM

## 2021-05-15 DIAGNOSIS — Z86010 Personal history of colonic polyps: Secondary | ICD-10-CM

## 2021-05-15 DIAGNOSIS — K219 Gastro-esophageal reflux disease without esophagitis: Secondary | ICD-10-CM

## 2021-05-15 MED ORDER — COLESTIPOL HCL 1 G PO TABS: 2.0000 g | ORAL_TABLET | Freq: Every evening | ORAL | 3 refills | Status: DC

## 2021-05-15 NOTE — Patient Instructions (Addendum)
If you are age 81 or older, your body mass index should be between 23-30. Your Body mass index is 21.97 kg/m. If this is out of the aforementioned range listed, please consider follow up with your Primary Care Provider.  If you are age 81 or younger, your body mass index should be between 19-25. Your Body mass index is 21.97 kg/m. If this is out of the aformentioned range listed, please consider follow up with your Primary Care Provider.   ________________________________________________________  The [facility] providers would like to encourage you to use [facility] to communicate with providers for non-urgent requests or questions.  Due to long hold times on the telephone, sending your provider a message by [facility] may be a faster and more efficient way to get a response.  Please allow 48 business hours for a response.  Please remember that this is for non-urgent requests.  _______________________________________________________  Continue Creon   Continue Pantoprazole every night   Continue Lomotil   Discontinue colestipol 48 hours prior to colonoscopy  We have sent the following medications to your pharmacy for you to pick up at your convenience: Colestipol 2 grams every night

## 2021-05-15 NOTE — Progress Notes (Signed)
Subjective:    Patient ID: [name], female    DOB: 12/10/1940, 80 y.o.   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  I saw her in February 2022 and she is here alone today.  She reports she is doing well.  Her loose stools and diarrhea certainly fluctuate.  Colestipol 1 g which she has been using at bedtime is certainly helpful.  She is using Lomotil 1 or 2 tablets as needed for diarrhea which is certainly not daily.  She has her bad days where she will have frequent loose stools and this exacerbates her hemorrhoids at times.  She is not really having abdominal pain.  Occasional upper back pain.  When she took a trip and did not have her usual dose of Creon she had to ration this and she had abdominal pain, felt terribly and had worsening bowel function.  She continues 6 Creon capsules with meals.  She also continues pantoprazole and reports reflux is well controlled.  Appetite is good.  No blood in stool or melena.  She would like to proceed with surveillance colonoscopy.  She continues to exercise 4 days a week.  She uses a seated elliptical machine and typically cycles about 14 miles per session.  She recently got the news that she will be a great grandmother.  Review of Systems As per HPI, otherwise negative  Current Medications, Allergies, Past Medical History, Past Surgical History, Family History and Social History were reviewed in [employer] record.    Objective:   Physical Exam BP 124/74    Pulse (!) 50    Ht 5' 3.5" (1.613 m)    Wt 126 lb (57.2 kg)    BMI 21.97 kg/m  Gen: awake, alert, NAD HEENT: anicteric  CV: RRR, no mrg Pulm: CTA b/l Abd: soft, NT/ND, +BS throughout Ext:  no c/c/e Neuro: nonfocal  CBC Latest Ref Rng & Units 07/12/2020 11/30/2019 05/31/2019  WBC 4.0 - 10.5 K/uL 7.4 9.0 8.3  Hemoglobin 12.0 - 15.0 g/dL 13.7 13.1 12.9  Hematocrit 36.0 - 46.0 % 40.1 39.4 38.8  Platelets 150.0 - 400.0 K/uL 274.0 251.0 275.0   CMP     Component Value Date/Time   NA 140 07/12/2020 1126   NA 143 02/13/2017 1136   K 4.0 07/12/2020 1126   CL 102 07/12/2020 1126   CO2 32 07/12/2020 1126   GLUCOSE 89 07/12/2020 1126   BUN 15 07/12/2020 1126   BUN 14 02/13/2017 1136   CREATININE 1.03 07/12/2020 1126   CREATININE 0.92 03/26/2013 0931   CALCIUM 9.2 07/12/2020 1126   PROT 6.8 07/12/2020 1126   ALBUMIN 4.0 07/12/2020 1126   AST 17 07/12/2020 1126   ALT 15 07/12/2020 1126   ALKPHOS 54 07/12/2020 1126   BILITOT 0.3 07/12/2020 1126   GFRNONAA 51 (L) 03/20/2018 1403   GFRAA 59 (L) 03/20/2018 1403       Assessment & Plan:  80 year old female with a history of 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  She has not had  any recent episodes of pancreatitis.  Prior pancreatic imaging has been reviewed and is reassuring.  She is definitively better with pancreatic enzyme replacement with Creon.  This has been affordable for her fortunately --Continue Creon 6 capsules with meals, 1-3 with snacks depending on size of snack and fat content --Continue multivitamin  2.  Chronic loose stools/IBS --related to #1 but also related to IBS.  I feel that her Creon dosing is correct and she has benefited from colestipol.  Still she will have some days with loose stools which seems sporadic.  I Minna increase her colestipol. --Increase colestipol to 2 g daily, she separates this from other meds  and typically takes this prior to bed --She can use Lomotil 1 to 2 tablets 4 times daily as needed  3.  History of fatty liver --normal liver enzymes.  Continue Vitamin E 800 daily  4.  GERD with dyspepsia --doing well with pantoprazole, continue 40 mg daily  5.  History of multiple adenomatous colon polyps --we discussed surveillance colonoscopy which is due at this time.  We discussed that colonoscopy for screening and surveillance typically stops around age 56 but this is an individualized decision based on [name]'s overall health.  She remains robust exercising for days a week.  She is not having any chest pain or shortness of breath.  She wishes to pursue surveillance colonoscopy.  After discussing the risk, benefits and alternatives she wishes to proceed and so this will be scheduled in January --Proceed with surveillance colonoscopy in the [place] in January  Office follow-up in about a year, sooner if needed  30 minutes total spent today including patient facing time, coordination of care, reviewing medical history/procedures/pertinent radiology studies, and documentation of the encounter.

## 2021-05-31 ENCOUNTER — Ambulatory Visit (AMBULATORY_SURGERY_CENTER): Payer: Medicare Other | Admitting: *Deleted

## 2021-05-31 ENCOUNTER — Other Ambulatory Visit: Payer: Self-pay

## 2021-05-31 VITALS — Ht 63.5 in | Wt 125.0 lb

## 2021-05-31 DIAGNOSIS — Z86010 Personal history of colonic polyps: Secondary | ICD-10-CM

## 2021-05-31 MED ORDER — NA SULFATE-K SULFATE-MG SULF 17.5-3.13-1.6 GM/177ML PO SOLN: 1.0000 | Freq: Once | ORAL | 0 refills | Status: AC

## 2021-05-31 NOTE — Progress Notes (Signed)
No egg or soy allergy known to patient  °No issues known to pt with past sedation with any surgeries or procedures °Patient denies ever being told they had issues or difficulty with intubation  °No FH of Malignant Hyperthermia °Pt is not on diet pills °Pt is not on  home 02  °Pt is not on blood thinners  °Pt denies issues with constipation  °No A fib or A flutter ° °Pt is fully vaccinated  for Covid  ° °Due to the COVID-19 pandemic we are asking patients to follow certain guidelines in [facility] and the [facility]   °Pt aware of COVID protocols and [facility] guidelines  ° °

## 2021-06-01 ENCOUNTER — Telehealth: Payer: Self-pay | Admitting: *Deleted

## 2021-06-01 NOTE — Telephone Encounter (Signed)
Prior auth started via cover my meds.  Awaiting determination.  Key: [id number]

## 2021-06-04 ENCOUNTER — Encounter: Payer: Medicare Other | Admitting: Family Medicine

## 2021-06-04 NOTE — Telephone Encounter (Signed)
Request Reference Number: [id number]. DOTTI DIS 0.025MG  is approved through 05/26/2022. Your patient may now fill this prescription and it will be covered.

## 2021-06-06 LAB — HM MAMMOGRAPHY

## 2021-06-06 LAB — HM DEXA SCAN

## 2021-06-08 ENCOUNTER — Encounter: Payer: Self-pay | Admitting: Internal Medicine

## 2021-06-11 ENCOUNTER — Telehealth: Payer: Self-pay | Admitting: Internal Medicine

## 2021-06-11 NOTE — Telephone Encounter (Signed)
Called pt- no answerLelan [name] [facility]

## 2021-06-11 NOTE — Telephone Encounter (Signed)
Called pt again- no answer- LM

## 2021-06-11 NOTE — Telephone Encounter (Signed)
Patient has upcoming procedure and ate nuts yesterday.  Please call her and let her know how to proceed.  Thank you.

## 2021-06-11 NOTE — Telephone Encounter (Signed)
Pt instructed to drink a lot of fluids today to flush out the colon -  no more nuts - pt verbalized understanding

## 2021-06-13 ENCOUNTER — Other Ambulatory Visit: Payer: Self-pay | Admitting: Family Medicine

## 2021-06-13 DIAGNOSIS — F419 Anxiety disorder, unspecified: Secondary | ICD-10-CM

## 2021-06-13 LAB — HM MAMMOGRAPHY

## 2021-06-14 ENCOUNTER — Other Ambulatory Visit: Payer: Self-pay

## 2021-06-14 ENCOUNTER — Encounter: Payer: Self-pay | Admitting: Internal Medicine

## 2021-06-14 ENCOUNTER — Encounter: Payer: Self-pay | Admitting: *Deleted

## 2021-06-14 ENCOUNTER — Ambulatory Visit (AMBULATORY_SURGERY_CENTER): Payer: Medicare Other | Admitting: Internal Medicine

## 2021-06-14 VITALS — BP 151/73 | HR 70 | Temp 97.9°F | Resp 14 | Ht 63.5 in | Wt 125.0 lb

## 2021-06-14 DIAGNOSIS — D122 Benign neoplasm of ascending colon: Secondary | ICD-10-CM

## 2021-06-14 DIAGNOSIS — Z86010 Personal history of colonic polyps: Secondary | ICD-10-CM

## 2021-06-14 MED ORDER — SODIUM CHLORIDE 0.9 % IV SOLN: 500.0000 mL | Freq: Once | INTRAVENOUS | Status: DC

## 2021-06-14 NOTE — Op Note (Signed)
[place] Patient Name: [name] Procedure Date: 06/14/2021 7:16 AM MRN: [id number] Endoscopist: Jerene Bears , MD Age: 81 Referring MD:  Date of Birth: 05/23/1941 Gender: Female Account #: [id number] Procedure:                Colonoscopy Indications:              High risk colon cancer surveillance: Personal                            history of multiple adenomas, Last colonoscopy:                            September 2019 Medicines:                Monitored Anesthesia Care Procedure:                Pre-Anesthesia Assessment:                           - Prior to the procedure, a History and Physical                            was performed, and patient medications and                            allergies were reviewed. The patient's tolerance of                            previous anesthesia was also reviewed. The risks                            and benefits of the procedure and the sedation                            options and risks were discussed with the patient.                            All questions were answered, and informed consent                            was obtained. Prior Anticoagulants: The patient has                            taken no previous anticoagulant or antiplatelet                            agents. ASA Grade Assessment: II - A patient with                            mild systemic disease. After reviewing the risks                            and benefits, the patient was deemed in  satisfactory condition to undergo the procedure.                           After obtaining informed consent, the colonoscope                            was passed under direct vision. Throughout the                            procedure, the patient's blood pressure, pulse, and                            oxygen saturations were monitored continuously. The                            Olympus PCF-H190DL (#[id number]) Colonoscope was                             introduced through the anus and advanced to the                            terminal ileum. The colonoscopy was performed                            without difficulty. The patient tolerated the                            procedure well. The quality of the bowel                            preparation was good. The terminal ileum, ileocecal                            valve, appendiceal orifice, and rectum were                            photographed. Scope In: 8:03:08 AM Scope Out: 8:22:50 AM Scope Withdrawal Time: 0 hours 14 minutes 20 seconds  Total Procedure Duration: 0 hours 19 minutes 42 seconds  Findings:                 The digital rectal exam was normal.                           Three sessile polyps were found in the ascending                            colon. The polyps were 3 to 5 mm in size. These                            polyps were removed with a cold snare. Resection                            and retrieval were complete.  Multiple small and large-mouthed diverticula were                            found in the sigmoid colon and distal descending                            colon.                           Banding scars and very minimal internal hemorrhoids                            were found during retroflexion. The hemorrhoids                            were small. Complications:            No immediate complications. Estimated Blood Loss:     Estimated blood loss: none. Impression:               - Three 3 to 5 mm polyps in the ascending colon,                            removed with a cold snare. Resected and retrieved.                           - Diverticulosis in the sigmoid colon and in the                            distal descending colon.                           - Very minimal internal hemorrhoids and                            post-banding scars in the distal rectum. Recommendation:           - Patient has a contact  number available for                            emergencies. The signs and symptoms of potential                            delayed complications were discussed with the                            patient. Return to normal activities tomorrow.                            Written discharge instructions were provided to the                            patient.                           - Resume previous diet.                           -  Continue present medications.                           - Await pathology results.                           - No repeat colonoscopy due to age at next                            surveillance interval. Jerene Bears, MD 06/14/2021 8:27:54 AM This report has been signed electronically.

## 2021-06-14 NOTE — Progress Notes (Signed)
Called to room to assist during endoscopic procedure.  Patient ID and intended procedure confirmed with present staff. Received instructions for my participation in the procedure from the performing physician.  

## 2021-06-14 NOTE — Progress Notes (Signed)
CNW - VS  Pt has nasal congestion, no fever, no cough,  Per pt, "it's my allergies".    Pt's states no  other medical or surgical changes since previsit or office visit.

## 2021-06-14 NOTE — Patient Instructions (Signed)
Handout on polyps and diverticulosis given    YOU HAD AN ENDOSCOPIC PROCEDURE TODAY AT THE [facility]:   Refer to the procedure report that was given to you for any specific questions about what was found during the examination.  If the procedure report does not answer your questions, please call your gastroenterologist to clarify.  If you requested that your care partner not be given the details of your procedure findings, then the procedure report has been included in a sealed envelope for you to review at your convenience later.  YOU SHOULD EXPECT: Some feelings of bloating in the abdomen. Passage of more gas than usual.  Walking can help get rid of the air that was put into your GI tract during the procedure and reduce the bloating. If you had a lower endoscopy (such as a colonoscopy or flexible sigmoidoscopy) you may notice spotting of blood in your stool or on the toilet paper. If you underwent a bowel prep for your procedure, you may not have a normal bowel movement for a few days.  Please Note:  You might notice some irritation and congestion in your nose or some drainage.  This is from the oxygen used during your procedure.  There is no need for concern and it should clear up in a day or so.  SYMPTOMS TO REPORT IMMEDIATELY:   Following lower endoscopy (colonoscopy or flexible sigmoidoscopy):  Excessive amounts of blood in the stool  Significant tenderness or worsening of abdominal pains  Swelling of the abdomen that is new, acute  Fever of 100F or higher   For urgent or emergent issues, a gastroenterologist can be reached at any hour by calling [phone]. Do not use MyChart messaging for urgent concerns.    DIET:  We do recommend a small meal at first, but then you may proceed to your regular diet.  Drink plenty of fluids but you should avoid alcoholic beverages for 24 hours.  ACTIVITY:  You should plan to take it easy for the rest of today and you should NOT  DRIVE or use heavy machinery until tomorrow (because of the sedation medicines used during the test).    FOLLOW UP: Our staff will call the number listed on your records 48-72 hours following your procedure to check on you and address any questions or concerns that you may have regarding the information given to you following your procedure. If we do not reach you, we will leave a message.  We will attempt to reach you two times.  During this call, we will ask if you have developed any symptoms of COVID 19. If you develop any symptoms (ie: fever, flu-like symptoms, shortness of breath, cough etc.) before then, please call [phone].  If you test positive for Covid 19 in the 2 weeks post procedure, please call and report this information to us.    If any biopsies were taken you will be contacted by phone or by letter within the next 1-3 weeks.  Please call us at [phone] if you have not heard about the biopsies in 3 weeks.    SIGNATURES/CONFIDENTIALITY: You and/or your care partner have signed paperwork which will be entered into your electronic medical record.  These signatures attest to the fact that that the information above on your After Visit Summary has been reviewed and is understood.  Full responsibility of the confidentiality of this discharge information lies with you and/or your care-partner. 

## 2021-06-14 NOTE — Progress Notes (Signed)
Report given to PACU, vss 

## 2021-06-14 NOTE — Progress Notes (Signed)
See office note dated 05/15/2021 for details No significant change in medical history since that time She remains appropriate for [place] colonoscopy today

## 2021-06-18 ENCOUNTER — Telehealth: Payer: Self-pay

## 2021-06-18 ENCOUNTER — Encounter: Payer: Self-pay | Admitting: Internal Medicine

## 2021-06-18 NOTE — Telephone Encounter (Signed)
Left message on follow up call. 

## 2021-06-18 NOTE — Telephone Encounter (Signed)
°  Follow up Call-  Call back number 06/14/2021  Post procedure Call Back phone  # [phone]  Permission to leave phone message Yes  Some recent data might be hidden     Patient questions:  Do you have a fever, pain , or abdominal swelling? No. Pain Score  0 *  Have you tolerated food without any problems? Yes.    Have you been able to return to your normal activities? Yes.    Do you have any questions about your discharge instructions: Diet   No. Medications  No. Follow up visit  No.  Do you have questions or concerns about your Care? No.  Actions: * If pain score is 4 or above: No action needed, pain <4.

## 2021-06-25 ENCOUNTER — Telehealth: Payer: Self-pay

## 2021-06-25 NOTE — Telephone Encounter (Signed)
Spoke with pt to let her know that her bone density is improving no changes per Dr. Charlett Blake not on dexa scan

## 2021-07-07 ENCOUNTER — Other Ambulatory Visit: Payer: Self-pay | Admitting: Internal Medicine

## 2021-08-08 ENCOUNTER — Other Ambulatory Visit: Payer: Self-pay | Admitting: Internal Medicine

## 2021-09-14 NOTE — Progress Notes (Signed)
? ?Subjective:  ? ? Patient ID: [name], female    DOB: 16-Jun-1940, 81 y.o.   MRN: [id number] ? ?Chief Complaint  ?Patient presents with  ? Annual Exam  ? ? ?HPI ?Patient is in today for her annual physical exam and follow up on chronic medical concerns. No recent febrile illness or hospitalizations. 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 Is trying to maintain a FODMAP diet and stay active. Denies CP/palp/SOB/HA/congestion/fevers/GI or GU c/o. Taking meds as prescribed  ? ?Past Medical History:  ?Diagnosis Date  ? Adenomatous colon polyp   ? Allergic state   ? Allergy   ? ANXIETY, CHRONIC   ? 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 Cataract   ? bilateral and removed  ? CHEST PAIN   ? LHC 02/2011: normal cors, EF 65%  ? Contact dermatitis 10/13/2017  ? Diverticulosis   ? Esophageal reflux   ? Fatty liver   ? Fibrocystic breast   ? Hand injury, left, initial encounter 10/13/2017  ? Hepatic cyst   ? Hepatic steatosis   ? History of shingles   ? HYPOTENSION, ORTHOSTATIC   ? IBS (irritable bowel syndrome)   ? Internal hemorrhoids without mention of complication   ? Kidney lesion   ? Kidney stone   ? Liver cyst   ? Osteoarthritis   ? in hands  ? Osteopenia   ? Osteopenia   ? Osteoporosis   ? Pain in ear   ? PALPITATIONS, RECURRENT   ? Pancreatitis chronic   ? Preventative health care   ? PSVT   ? 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 SHOULDER PAIN   ? Syncope and collapse   ? broke a rib, and a vertabre  ? TOBACCO USE, QUIT   ? VITAMIN D DEFICIENCY   ? ? ?Past Surgical History:  ?Procedure Laterality Date  ? ABDOMINAL HYSTERECTOMY  1998  ? BREAST SURGERY    ? breast bx on left, benign  ? CARDIAC CATHETERIZATION    ? CATARACT EXTRACTION, BILATERAL    ? CATARACT EXTRACTION, BILATERAL    ? CHOLECYSTECTOMY  1999  ? COLONOSCOPY    ? EYE SURGERY Bilateral 04/2017, 05/2017  ? cataracts  ? [place]  ? POLYPECTOMY    ? TUBAL  LIGATION    ? with D&C  ? ? ?Family History  ?Problem Relation Age of Onset  ? Hypertension Mother   ? Stroke Mother   ? Alcohol abuse Mother   ? Heart disease Father   ? Bladder Cancer Father   ? Diabetes Father   ? Hypothyroidism Father   ? Dementia Father   ? Uterine cancer Paternal Grandmother   ? Heart disease Sister   ?     rheumatic fever at 6 months, cardiomegaly MI  ? Heart disease Maternal Grandmother   ? Stroke Maternal Grandmother   ? Heart disease Maternal Grandfather   ? Stroke Maternal Grandfather   ? Breast cancer Cousin   ? Pancreatic cancer Paternal Aunt   ? Stomach cancer Paternal Uncle   ? Breast cancer Paternal Aunt   ? Lung cancer Paternal Uncle   ? Crohn's disease Son   ? Colon cancer Neg Hx   ? Colon polyps Neg Hx   ? Esophageal cancer Neg Hx   ? Rectal cancer Neg Hx   ? ? ?Social History  ? ?Socioeconomic History  ? Marital status: Married  ?  Spouse name: Not on file  ? Number of children: 1  ? Years of education: Not on file  ? Highest education level: Not on file  ?Occupational History  ? Occupation: Retired  ?  Employer: RETIRED  ?Tobacco Use  ? Smoking status: Former  ?  Types: Cigarettes  ?  Quit date: 05/27/1973  ?  Years since quitting: 48.3  ? Smokeless tobacco: Never  ?Vaping Use  ? Vaping Use: Never used  ?Substance and Sexual Activity  ? Alcohol use: No  ? Drug use: No  ? Sexual activity: Not on file  ?Other Topics Concern  ? Not on file  ?Social History Narrative  ? Not on file  ? ?Social Determinants of Health  ? ?Financial Resource Strain: Low Risk   ? Difficulty of Paying Living Expenses: Not hard at all  ?Food Insecurity: No Food Insecurity  ? Worried About Charity fundraiser in the Last Year: Never true  ? Ran Out of Food in the Last Year: Never true  ?Transportation Needs: No Transportation Needs  ? Lack of Transportation (Medical): No  ? Lack of Transportation (Non-Medical): No  ?Physical Activity: Sufficiently Active  ? Days of Exercise per Week: 4 days  ? Minutes of  Exercise per Session: 60 min  ?Stress: No Stress Concern Present  ? Feeling of Stress : Not at all  ?Social Connections: Moderately Integrated  ? Frequency of Communication with Friends and Family: More than three times a week  ? Frequency of Social Gatherings with Friends and Family: More than three times a week  ? Attends Religious Services: More than 4 times per year  ? Active Member of Clubs or Organizations: No  ? Attends Archivist Meetings: Never  ? Marital Status: Married  ?Intimate Partner Violence: Not At Risk  ? Fear of Current or Ex-Partner: No  ? Emotionally Abused: No  ? Physically Abused: No  ? Sexually Abused: No  ? ? ?Outpatient Medications Prior to Visit  ?Medication Sig Dispense Refill  ? Ascorbic Acid (VITAMIN C) 500 MG tablet Take 500 mg by mouth daily.    ? Azelaic Acid 15 % gel Apply 1 application topically at bedtime.    ? b complex vitamins tablet Take 1 tablet by mouth daily.    ? cetirizine (ZYRTEC) 10 MG tablet Take 1 tablet (10 mg total) by mouth daily. 90 tablet 3  ? Cholecalciferol (VITAMIN D3) 1000 UNITS CAPS Take 1 tablet by mouth daily.    ? colestipol (COLESTID) 1 g tablet Take 2 tablets (2 g total) by mouth every evening. Take 2 hour apart from all other medications 60 tablet 3  ? Cranberry 500 MG CAPS Take 1 capsule by mouth daily.    ? CREON 24000-76000 units CPEP TAKE 6 CAPS BY MOUTH WITH  MEALS AND 1-3 CAPS WITH  EACH SNACK. IF EATING HIGH  FAT ITEMS, TAKE 8 CAPS WITH MEALS. MAX 30 CAPS PER DAY 2700 capsule 1  ? diphenoxylate-atropine (LOMOTIL) 2.5-0.025 MG tablet Take 1 tablet by mouth 4 (four) times daily as needed for diarrhea or loose stools. 360 tablet 1  ? escitalopram (LEXAPRO) 10 MG tablet TAKE 1 TABLET BY MOUTH  DAILY 90 tablet 3  ? fluticasone (FLONASE) 50 MCG/ACT nasal spray Place 2 sprays into both nostrils as needed for rhinitis or allergies. 48 g 3  ? pantoprazole (PROTONIX) 40 MG tablet TAKE 1 TABLET BY MOUTH  DAILY 90 tablet 3  ? Probiotic Product  (PROBIOTIC PO)  Take by mouth. Takes Probaclac probiotic once daily    ? tretinoin (RETIN-A) 0.025 % cream Apply 1 application topically 4 (four) times a week.    ? TURMERIC PO Take by mouth daily.    ? DOTTI 0.025 MG/24HR APPLY 1 PATCH TOPICALLY TO  SKIN TWICE WEEKLY 24 patch 3  ? ?No facility-administered medications prior to visit.  ? ? ?Allergies  ?Allergen Reactions  ? Amoxicillin   ?  REACTION: unspecified  ? Azithromycin   ?  REACTION: Rash  ? Ciprofloxacin   ?  REACTION: GI upset  ? Epinephrine   ?  REACTION: elevated heart rate  ? Penicillins   ?  REACTION: rash  ? Sulfonamide Derivatives   ?  REACTION: sick to stomach  ? Tamiflu [Oseltamivir Phosphate] Hives  ? ? ?Review of Systems  ?Constitutional:  Negative for chills, fever and malaise/fatigue.  ?HENT:  Negative for congestion and hearing loss.   ?Eyes:  Negative for discharge.  ?Respiratory:  Negative for cough, sputum production and shortness of breath.   ?Cardiovascular:  Negative for chest pain, palpitations and leg swelling.  ?Gastrointestinal:  Negative for abdominal pain, blood in stool, constipation, diarrhea, heartburn, nausea and vomiting.  ?Genitourinary:  Negative for dysuria, frequency, hematuria and urgency.  ?Musculoskeletal:  Negative for back pain, falls and myalgias.  ?Skin:  Negative for rash.  ?Neurological:  Negative for dizziness, sensory change, loss of consciousness, weakness and headaches.  ?Endo/Heme/Allergies:  Negative for environmental allergies. Does not bruise/bleed easily.  ?Psychiatric/Behavioral:  Negative for depression and suicidal ideas. The patient is not nervous/anxious and does not have insomnia.   ? ?   ?Objective:  ?  ?Physical Exam ?Constitutional:   ?   General: She is not in acute distress. ?   Appearance: She is well-developed.  ?HENT:  ?   Head: Normocephalic and atraumatic.  ?Eyes:  ?   Conjunctiva/sclera: Conjunctivae normal.  ?Neck:  ?   Thyroid: No thyromegaly.  ?Cardiovascular:  ?   Rate and Rhythm:  Normal rate and regular rhythm.  ?   Heart sounds: Normal heart sounds. No murmur heard. ?Pulmonary:  ?   Effort: Pulmonary effort is normal. No respiratory distress.  ?   Breath sounds: Normal breath sounds.  ?Ab

## 2021-09-18 ENCOUNTER — Ambulatory Visit (INDEPENDENT_AMBULATORY_CARE_PROVIDER_SITE_OTHER): Payer: Medicare Other | Admitting: Family Medicine

## 2021-09-18 ENCOUNTER — Encounter: Payer: Self-pay | Admitting: Family Medicine

## 2021-09-18 VITALS — BP 126/82 | HR 55 | Resp 20 | Ht 63.5 in | Wt 126.0 lb

## 2021-09-18 DIAGNOSIS — K861 Other chronic pancreatitis: Secondary | ICD-10-CM | POA: Diagnosis not present

## 2021-09-18 DIAGNOSIS — M810 Age-related osteoporosis without current pathological fracture: Secondary | ICD-10-CM | POA: Diagnosis not present

## 2021-09-18 DIAGNOSIS — R739 Hyperglycemia, unspecified: Secondary | ICD-10-CM

## 2021-09-18 DIAGNOSIS — E559 Vitamin D deficiency, unspecified: Secondary | ICD-10-CM | POA: Diagnosis not present

## 2021-09-18 DIAGNOSIS — Z0000 Encounter for general adult medical examination without abnormal findings: Secondary | ICD-10-CM | POA: Diagnosis not present

## 2021-09-18 MED ORDER — ESTRADIOL 0.025 MG/24HR TD PTTW: MEDICATED_PATCH | TRANSDERMAL | 3 refills | Status: DC

## 2021-09-18 NOTE — Patient Instructions (Addendum)
Shingrix is the new shingles shot, 2 shots over 2-6 months, confirm coverage with insurance and document, then can return here for shots with nurse appt or at pharmacy   Preventive Care 65 Years and Older, Female Preventive care refers to lifestyle choices and visits with your health care provider that can promote health and wellness. Preventive care visits are also called wellness exams. What can I expect for my preventive care visit? Counseling Your health care provider may ask you questions about your: Medical history, including: Past medical problems. Family medical history. Pregnancy and menstrual history. History of falls. Current health, including: Memory and ability to understand (cognition). Emotional well-being. Home life and relationship well-being. Sexual activity and sexual health. Lifestyle, including: Alcohol, nicotine or tobacco, and drug use. Access to firearms. Diet, exercise, and sleep habits. Work and work environment. Sunscreen use. Safety issues such as seatbelt and bike helmet use. Physical exam Your health care provider will check your: Height and weight. These may be used to calculate your BMI (body mass index). BMI is a measurement that tells if you are at a healthy weight. Waist circumference. This measures the distance around your waistline. This measurement also tells if you are at a healthy weight and may help predict your risk of certain diseases, such as type 2 diabetes and high blood pressure. Heart rate and blood pressure. Body temperature. Skin for abnormal spots. What immunizations do I need?  Vaccines are usually given at various ages, according to a schedule. Your health care provider will recommend vaccines for you based on your age, medical history, and lifestyle or other factors, such as travel or where you work. What tests do I need? Screening Your health care provider may recommend screening tests for certain conditions. This may  include: Lipid and cholesterol levels. Hepatitis C test. Hepatitis B test. HIV (human immunodeficiency virus) test. STI (sexually transmitted infection) testing, if you are at risk. Lung cancer screening. Colorectal cancer screening. Diabetes screening. This is done by checking your blood sugar (glucose) after you have not eaten for a while (fasting). Mammogram. Talk with your health care provider about how often you should have regular mammograms. BRCA-related cancer screening. This may be done if you have a family history of breast, ovarian, tubal, or peritoneal cancers. Bone density scan. This is done to screen for osteoporosis. Talk with your health care provider about your test results, treatment options, and if necessary, the need for more tests. Follow these instructions at home: Eating and drinking  Eat a diet that includes fresh fruits and vegetables, whole grains, lean protein, and low-fat dairy products. Limit your intake of foods with high amounts of sugar, saturated fats, and salt. Take vitamin and mineral supplements as recommended by your health care provider. Do not drink alcohol if your health care provider tells you not to drink. If you drink alcohol: Limit how much you have to 0-1 drink a day. Know how much alcohol is in your drink. In the [place] one drink equals one 12 oz bottle of beer (355 mL), one 5 oz glass of wine (148 mL), or one 1 oz glass of hard liquor (44 mL). Lifestyle Brush your teeth every morning and night with fluoride toothpaste. Floss one time each day. Exercise for at least 30 minutes 5 or more days each week. Do not use any products that contain nicotine or tobacco. These products include cigarettes, chewing tobacco, and vaping devices, such as e-cigarettes. If you need help quitting, ask your health care provider.   Do not use drugs. If you are sexually active, practice safe sex. Use a condom or other form of protection in order to prevent STIs. Take  aspirin only as told by your health care provider. Make sure that you understand how much to take and what form to take. Work with your health care provider to find out whether it is safe and beneficial for you to take aspirin daily. Ask your health care provider if you need to take a cholesterol-lowering medicine (statin). Find healthy ways to manage stress, such as: Meditation, yoga, or listening to music. Journaling. Talking to a trusted person. Spending time with friends and family. Minimize exposure to UV radiation to reduce your risk of skin cancer. Safety Always wear your seat belt while driving or riding in a vehicle. Do not drive: If you have been drinking alcohol. Do not ride with someone who has been drinking. When you are tired or distracted. While texting. If you have been using any mind-altering substances or drugs. Wear a helmet and other protective equipment during sports activities. If you have firearms in your house, make sure you follow all gun safety procedures. What's next? Visit your health care provider once a year for an annual wellness visit. Ask your health care provider how often you should have your eyes and teeth checked. Stay up to date on all vaccines. This information is not intended to replace advice given to you by your health care provider. Make sure you discuss any questions you have with your health care provider. Document Revised: 11/08/2020 Document Reviewed: 11/08/2020 Elsevier Patient Education  2023 [employer]  

## 2021-09-18 NOTE — Assessment & Plan Note (Signed)
Encouraged to get adequate exercise, calcium and vitamin d intake, improving without meds. ?

## 2021-09-18 NOTE — Assessment & Plan Note (Addendum)
Patient encouraged to maintain heart healthy diet, regular exercise, adequate sleep. Consider daily probiotics. Take medications as prescribed. Labs ordered and reviewed. Last Dexa was January 2023. MGM 1//2023 repeat next year. Last colonoscopy 05/2021. Shingrix is the new shingles shot, 2 shots over 2-6 months, confirm coverage with insurance and document, then can return here for shots with nurse appt or at pharmacy ?

## 2021-09-18 NOTE — Assessment & Plan Note (Signed)
Supplement and monitor 

## 2021-09-18 NOTE — Assessment & Plan Note (Addendum)
Following with gastroenterology, Dr Hilarie Fredrickson, still hast o be careful and manage her dairy and fast food. Tries to follow the [employer] diet. Lipase mildly elevated and stable ?

## 2021-09-19 ENCOUNTER — Other Ambulatory Visit (INDEPENDENT_AMBULATORY_CARE_PROVIDER_SITE_OTHER): Payer: Medicare Other

## 2021-09-19 ENCOUNTER — Other Ambulatory Visit: Payer: Self-pay | Admitting: *Deleted

## 2021-09-19 DIAGNOSIS — K861 Other chronic pancreatitis: Secondary | ICD-10-CM | POA: Diagnosis not present

## 2021-09-19 LAB — VITAMIN D 25 HYDROXY (VIT D DEFICIENCY, FRACTURES): VITD: 57.44 ng/mL (ref 30.00–100.00)

## 2021-09-19 LAB — HEMOGLOBIN A1C: Hgb A1c MFr Bld: 5.8 % (ref 4.6–6.5)

## 2021-09-19 LAB — CBC
HCT: 40.6 % (ref 36.0–46.0)
Hemoglobin: 13.6 g/dL (ref 12.0–15.0)
MCHC: 33.4 g/dL (ref 30.0–36.0)
MCV: 91.8 fl (ref 78.0–100.0)
Platelets: 253 10*3/uL (ref 150.0–400.0)
RBC: 4.43 Mil/uL (ref 3.87–5.11)
RDW: 14.2 % (ref 11.5–15.5)
WBC: 7.9 10*3/uL (ref 4.0–10.5)

## 2021-09-19 LAB — LIPID PANEL
Cholesterol: 151 mg/dL (ref 0–200)
HDL: 99.1 mg/dL (ref >39.00)
LDL Cholesterol: 35 mg/dL (ref 0–99)
NonHDL: 52.02
Total CHOL/HDL Ratio: 2
Triglycerides: 85 mg/dL (ref 0.0–149.0)
VLDL: 17 mg/dL (ref 0.0–40.0)

## 2021-09-19 LAB — COMPREHENSIVE METABOLIC PANEL
ALT: 17 U/L (ref 0–35)
AST: 20 U/L (ref 0–37)
Albumin: 4.2 g/dL (ref 3.5–5.2)
Alkaline Phosphatase: 63 U/L (ref 39–117)
BUN: 16 mg/dL (ref 6–23)
CO2: 31 mEq/L (ref 19–32)
Calcium: 8.8 mg/dL (ref 8.4–10.5)
Chloride: 104 mEq/L (ref 96–112)
Creatinine, Ser: 0.96 mg/dL (ref 0.40–1.20)
GFR: 55.45 mL/min — ABNORMAL LOW (ref >60.00)
Glucose, Bld: 98 mg/dL (ref 70–99)
Potassium: 3.9 mEq/L (ref 3.5–5.1)
Sodium: 143 mEq/L (ref 135–145)
Total Bilirubin: 0.4 mg/dL (ref 0.2–1.2)
Total Protein: 6.3 g/dL (ref 6.0–8.3)

## 2021-09-19 LAB — TSH: TSH: 3.32 u[IU]/mL (ref 0.35–5.50)

## 2021-09-19 LAB — LIPASE: Lipase: 86 U/L — ABNORMAL HIGH (ref 11.0–59.0)

## 2021-11-27 ENCOUNTER — Other Ambulatory Visit: Payer: Self-pay | Admitting: Internal Medicine

## 2021-11-29 ENCOUNTER — Telehealth: Payer: Self-pay | Admitting: Internal Medicine

## 2021-11-30 NOTE — Telephone Encounter (Signed)
See note below and advise. 

## 2021-12-03 ENCOUNTER — Other Ambulatory Visit: Payer: Self-pay

## 2021-12-03 MED ORDER — CHOLESTYRAMINE 4 G PO PACK: 4.0000 g | PACK | Freq: Three times a day (TID) | ORAL | 12 refills | Status: DC

## 2021-12-03 NOTE — Telephone Encounter (Signed)
New prescription sent to pharmacy. Pts husband aware.

## 2021-12-03 NOTE — Telephone Encounter (Signed)
Could try cholestyramine 4 g as a substitute

## 2021-12-27 ENCOUNTER — Telehealth: Payer: Self-pay | Admitting: Internal Medicine
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
## 2021-12-28 NOTE — Telephone Encounter (Addendum)
Called pt advised that OV was scheduled for the wrong date.  Advised pt OV is not for today I will cancel OV with Swinyer, NP and for pt to keep OV with Dunlap, PA for 01/02/22.  Advised pt if has any further syncopal episodes to go to ED or call into our office for further instruction.  She verbalizes understanding. I apologized for scheduling OV on the wrong day.
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
## 2022-01-02 ENCOUNTER — Ambulatory Visit (INDEPENDENT_AMBULATORY_CARE_PROVIDER_SITE_OTHER): Payer: Medicare Other

## 2022-01-02 ENCOUNTER — Ambulatory Visit (INDEPENDENT_AMBULATORY_CARE_PROVIDER_SITE_OTHER): Payer: Medicare Other | Admitting: Physician Assistant

## 2022-01-02 ENCOUNTER — Encounter: Payer: Self-pay | Admitting: Physician Assistant

## 2022-01-02 VITALS — BP 158/80 | HR 70 | Ht 64.5 in | Wt 125.8 lb

## 2022-01-02 DIAGNOSIS — R55 Syncope and collapse: Secondary | ICD-10-CM

## 2022-01-02 DIAGNOSIS — I471 Supraventricular tachycardia: Secondary | ICD-10-CM | POA: Diagnosis not present

## 2022-01-02 NOTE — Patient Instructions (Signed)
Medication Instructions:   Your physician recommends that you continue on your current medications as directed. Please refer to the Current Medication list given to you today.  *If you need a refill on your cardiac medications before your next appointment, please call your pharmacy*   Lab Work: BMET AND CBC TODAY   If you have labs (blood work) drawn today and your tests are completely normal, you will receive your results only by: [place] (if you have MyChart) OR A paper copy in the mail If you have any lab test that is abnormal or we need to change your treatment, we will call you to review the results.   Testing/Procedures: Your physician has recommended that you wear an event monitor. Event monitors are medical devices that record the heart's electrical activity. Doctors most often [place] these monitors to diagnose arrhythmias. Arrhythmias are problems with the speed or rhythm of the heartbeat. The monitor is a small, portable device. You can wear one while you do your normal daily activities. This is usually used to diagnose what is causing palpitations/syncope (passing out).    Follow-Up: At [facility], you and your health needs are our priority.  As part of our continuing mission to provide you with exceptional heart care, we have created designated Provider Care Teams.  These Care Teams include your primary Cardiologist (physician) and Advanced Practice Providers (APPs -  Physician Assistants and Nurse Practitioners) who all work together to provide you with the care you need, when you need it.  We recommend signing up for the patient portal called "MyChart".  Sign up information is provided on this After Visit Summary.  MyChart is used to connect with patients for Virtual Visits (Telemedicine).  Patients are able to view lab/test results, encounter notes, upcoming appointments, etc.  Non-urgent messages can be sent to your provider as well.   To learn more about what you can  do with MyChart, go to [email or web address].    Your next appointment:   4-6 week(s)  The format for your next appointment:   In Person  Provider:   You may see Virl Axe, MD or one of the following Advanced Practice Providers on your designated Care Team:   Tommye Standard, [place] Legrand Como "Jonni Sanger" Chalmers Cater, [place]   Other Instructions  NO DRIVING FOR 6 MONTHS OR MORE MAKE SURE YOU HYDRATE WELL AND AVOID HEAT AND STAND SLOWLY    Important Information About Sugar

## 2022-01-02 NOTE — Progress Notes (Unsigned)
ZIO AT serial #[id number] from office inventory applied to patient.   Dr. Caryl Comes to read.

## 2022-01-03 ENCOUNTER — Ambulatory Visit: Payer: Medicare Other | Admitting: Nurse Practitioner

## 2022-01-03 LAB — CBC
Hematocrit: 40.1 % (ref 34.0–46.6)
Hemoglobin: 13.9 g/dL (ref 11.1–15.9)
MCH: 30.9 pg (ref 26.6–33.0)
MCHC: 34.7 g/dL (ref 31.5–35.7)
MCV: 89 fL (ref 79–97)
Platelets: 283 10*3/uL (ref 150–450)
RBC: 4.5 x10E6/uL (ref 3.77–5.28)
RDW: 13.7 % (ref 11.7–15.4)
WBC: 8.1 10*3/uL (ref 3.4–10.8)

## 2022-01-03 LAB — BASIC METABOLIC PANEL
BUN/Creatinine Ratio: 15 (ref 12–28)
BUN: 16 mg/dL (ref 8–27)
CO2: 27 mmol/L (ref 20–29)
Calcium: 9.5 mg/dL (ref 8.7–10.3)
Chloride: 101 mmol/L (ref 96–106)
Creatinine, Ser: 1.05 mg/dL — ABNORMAL HIGH (ref 0.57–1.00)
Glucose: 87 mg/dL (ref 70–99)
Potassium: 4.5 mmol/L (ref 3.5–5.2)
Sodium: 141 mmol/L (ref 134–144)
eGFR: 53 mL/min/{1.73_m2} — ABNORMAL LOW (ref >59)

## 2022-01-09 ENCOUNTER — Other Ambulatory Visit: Payer: Self-pay | Admitting: Family Medicine

## 2022-01-09 DIAGNOSIS — T7840XD Allergy, unspecified, subsequent encounter: Secondary | ICD-10-CM

## 2022-01-09 DIAGNOSIS — J101 Influenza due to other identified influenza virus with other respiratory manifestations: Secondary | ICD-10-CM

## 2022-01-16 ENCOUNTER — Telehealth: Payer: Self-pay | Admitting: Physician Assistant

## 2022-01-16 NOTE — Telephone Encounter (Signed)
Instructed patient to mail back Heart Monitor symptom log with her monitor. Patient states she will mail it back tomorrow.

## 2022-01-16 NOTE — Telephone Encounter (Signed)
Pt would like to know if the book that was with her Heart Monitor needs to be mailed back as well or does she keep to bring in to next appt. Please advise

## 2022-01-16 NOTE — Telephone Encounter (Signed)
Will route this message to our monitor tech to further follow-up with the pt about monitor return question.

## 2022-01-17 ENCOUNTER — Ambulatory Visit (HOSPITAL_COMMUNITY): Payer: Medicare Other | Attending: Cardiology

## 2022-01-17 DIAGNOSIS — R55 Syncope and collapse: Secondary | ICD-10-CM

## 2022-01-17 LAB — ECHOCARDIOGRAM COMPLETE
Area-P 1/2: 3.74 cm2
S' Lateral: 2.5 cm
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
## 2022-01-29 ENCOUNTER — Telehealth: Payer: Self-pay | Admitting: Internal Medicine

## 2022-01-29 ENCOUNTER — Other Ambulatory Visit: Payer: Self-pay | Admitting: Internal Medicine

## 2022-01-29 NOTE — Telephone Encounter (Signed)
Patient has questions about her meds.  She is unable to swallow it.  She has prescriptions she hasn't filled and needs to find out what she needs to take or not.  She sounds confused. She said if you can't reach her, please feel free to leave her a detailed voicemail.  Please call and advise.  Thank you.

## 2022-01-30 ENCOUNTER — Other Ambulatory Visit: Payer: Self-pay

## 2022-01-30 ENCOUNTER — Ambulatory Visit: Payer: Medicare Other | Attending: Physician Assistant | Admitting: Physician Assistant

## 2022-01-30 ENCOUNTER — Encounter: Payer: Self-pay | Admitting: Physician Assistant

## 2022-01-30 VITALS — BP 132/78 | HR 84 | Ht 64.0 in | Wt 125.0 lb

## 2022-01-30 DIAGNOSIS — I951 Orthostatic hypotension: Secondary | ICD-10-CM

## 2022-01-30 DIAGNOSIS — R55 Syncope and collapse: Secondary | ICD-10-CM

## 2022-01-30 DIAGNOSIS — I471 Supraventricular tachycardia: Secondary | ICD-10-CM | POA: Diagnosis not present

## 2022-01-30 DIAGNOSIS — I4719 Other supraventricular tachycardia: Secondary | ICD-10-CM

## 2022-01-30 MED ORDER — DIPHENOXYLATE-ATROPINE 2.5-0.025 MG PO TABS
1.0000 | ORAL_TABLET | Freq: Four times a day (QID) | ORAL | 1 refills | Status: DC | PRN
Start: 2022-01-30 — End: 2022-12-12

## 2022-01-30 MED ORDER — COLESTIPOL HCL 1 G PO TABS: 1.0000 g | ORAL_TABLET | Freq: Every day | ORAL | 3 refills | Status: DC

## 2022-01-30 NOTE — Telephone Encounter (Signed)
Patient is returning your call.  

## 2022-01-30 NOTE — Patient Instructions (Signed)
Medication Instructions:   Your physician recommends that you continue on your current medications as directed. Please refer to the Current Medication list given to you today.  *If you need a refill on your cardiac medications before your next appointment, please call your pharmacy*   Lab Work: [place]    If you have labs (blood work) drawn today and your tests are completely normal, you will receive your results only by: [place] (if you have MyChart) OR A paper copy in the mail If you have any lab test that is abnormal or we need to change your treatment, we will call you to review the results.   Testing/Procedures: NONE ORDERED  TODAY     Follow-Up: At [facility], you and your health needs are our priority.  As part of our continuing mission to provide you with exceptional heart care, we have created designated Provider Care Teams.  These Care Teams include your primary Cardiologist (physician) and Advanced Practice Providers (APPs -  Physician Assistants and Nurse Practitioners) who all work together to provide you with the care you need, when you need it.  We recommend signing up for the patient portal called "MyChart".  Sign up information is provided on this After Visit Summary.  MyChart is used to connect with patients for Virtual Visits (Telemedicine).  Patients are able to view lab/test results, encounter notes, upcoming appointments, etc.  Non-urgent messages can be sent to your provider as well.   To learn more about what you can do with MyChart, go to [email or web address].    Your next appointment:   2 month(s)  LOOP RECORDER  ( CONTACT [facility] FOR EP SCHEDULING ISSUES )   The format for your next appointment:   In Person  Provider:   Virl Axe, MD    Other Instructions  RENEE EMAIL CONTACT: [email or web address]  Important Information About Sugar

## 2022-01-30 NOTE — Telephone Encounter (Signed)
Left message for pt to call back  °

## 2022-01-30 NOTE — Telephone Encounter (Signed)
Pt states that her refill for colestipol was denied. Pt states that she wanted to try cholestyramine but she could not work out the timing with all her other meds so she never took it. Pt wants refill on colestipol and lomotil. Refills sent to pharmacy for pt and she is aware.

## 2022-02-01 ENCOUNTER — Other Ambulatory Visit (HOSPITAL_COMMUNITY): Payer: Self-pay

## 2022-02-01 ENCOUNTER — Telehealth: Payer: Self-pay | Admitting: Pharmacy Technician

## 2022-02-01 NOTE — Telephone Encounter (Signed)
Patient Advocate Encounter  Received notification that prior authorization for LOMOTIL [facility]) is required.   PA submitted on 9.8.23 [facility] Status is pending    Luciano Cutter, CPhT Patient Advocate Phone: [phone]

## 2022-02-01 NOTE — Telephone Encounter (Signed)
Patient Advocate Encounter  Prior Authorization for Diphenoxylate-Atropine 2.5-0.'025MG'$  tablets has been approved.    Effective: 02-01-2022 to 05-26-2022  Test claims return a $4.15 co-pay

## 2022-04-30 ENCOUNTER — Telehealth (INDEPENDENT_AMBULATORY_CARE_PROVIDER_SITE_OTHER): Payer: Medicare Other | Admitting: Family Medicine

## 2022-04-30 VITALS — Ht 65.0 in | Wt 125.0 lb

## 2022-04-30 DIAGNOSIS — Z0000 Encounter for general adult medical examination without abnormal findings: Secondary | ICD-10-CM
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
## 2022-04-30 NOTE — Patient Instructions (Addendum)
I really enjoyed getting to talk with you today! I am available on Tuesdays and Thursdays for virtual visits if you have any questions or concerns, or if I can be of any further assistance.   CHECKLIST FROM ANNUAL WELLNESS VISIT:  -Follow up (please call to schedule if not scheduled after visit):  -Inperson visit with your Primary Doctor office: -yearly for annual wellness visit with primary care office  Here is a list of your preventive care/health maintenance measures and the plan for each if any are due:  Health Maintenance  Topic Date Due   Zoster Vaccines- Shingrix (2 of 2) Please bring vaccine record to your doctor office if you can   COVID-19 Vaccine (3 - Pfizer risk series) Deferred per your request   INFLUENZA VACCINE  Can get at the office or at the pharmacy   MAMMOGRAM  06/13/2022   Medicare Annual Wellness ([place])  05/01/2023   COLONOSCOPY (Pts 45-15yr Insurance coverage will need to be confirmed)  06/14/2024   DTaP/Tdap/Td (3 - Td or Tdap) 10/14/2027   Pneumonia Vaccine 81 Years old  Completed   DEXA SCAN  Completed   HPV VACCINES  Aged Out    -we recommend seeing a dentist and getting and eye check yearly  -Other issues addressed today: -see balance exercises we discussed below, try working up to 3 days per week -gradually add a little of the 2 lbs weights to your routine workout 2 days per week with approval from your cardiologist   Physical Activity: -if you wish to increase your physical activity, do so gradually and with the approval of your doctor -STOP and seek medical care immediately if you have any chest pain, dizziness, chest discomfort or trouble breathing when starting or increasing exercise  -move and stretch  your body, legs, feet and arms when sitting for long periods -Physical activity guidelines for optimal health in adults: -least 150 minutes per week of aerobic exercise (can talk, but not sing) once approved by your doctor, 20-30 minutes of sustained activity or two 10 minute episodes of sustained activity every day.  -resistance training at least 2 days per week if approved by your doctor  -balance exercises 3+ days per week:   Stand somewhere where you have something sturdy to hold onto in case you lose balance.    1) lift up on toes, start with 5x per day and work up to 20x   2) stand and lift on leg straight out to the side so that foot is a few inches of the floor, start with 5x each side and work up to 20x each side   3) stand on one foot, start with 5 seconds each side and work up to 20 seconds on each side

## 2022-05-02 DIAGNOSIS — R55 Syncope and collapse: Secondary | ICD-10-CM | POA: Insufficient documentation

## 2022-05-03 ENCOUNTER — Encounter: Payer: Self-pay | Admitting: Internal Medicine

## 2022-05-03 ENCOUNTER — Telehealth: Payer: Self-pay | Admitting: Internal Medicine

## 2022-05-03 ENCOUNTER — Ambulatory Visit: Payer: Medicare Other | Attending: Internal Medicine | Admitting: Internal Medicine

## 2022-05-03 VITALS — BP 170/102 | HR 79 | Ht 65.0 in | Wt 123.8 lb

## 2022-05-03 DIAGNOSIS — I471 Supraventricular tachycardia, unspecified: Secondary | ICD-10-CM | POA: Diagnosis not present

## 2022-05-03 DIAGNOSIS — R55 Syncope and collapse: Secondary | ICD-10-CM

## 2022-05-03 DIAGNOSIS — I10 Essential (primary) hypertension: Secondary | ICD-10-CM

## 2022-05-03 MED ORDER — AMLODIPINE BESYLATE 2.5 MG PO TABS: 2.5000 mg | ORAL_TABLET | Freq: Every day | ORAL | 3 refills | Status: DC

## 2022-05-03 NOTE — Telephone Encounter (Signed)
Pt would like a callback regarding password for Monitor. Please advise

## 2022-05-03 NOTE — Telephone Encounter (Signed)
I spoke with the patient and called [facility] tech support for additional help.

## 2022-05-03 NOTE — Patient Instructions (Addendum)
Medication Instructions:  Your physician has recommended you make the following change in your medication:   ** Begin Amlodipine 2.'5mg'$  - 1 tablet by mouth daily   Labwork: None ordered.  Testing/Procedures: None ordered.  Follow-Up:  Dr Caryl Comes in 6 months   Implantable Loop Recorder Placement, Care After This sheet gives you information about how to care for yourself after your procedure. Your health care provider may also give you more specific instructions. If you have problems or questions, contact your health care provider. What can I expect after the procedure? After the procedure, it is common to have: Soreness or discomfort near the incision. Some swelling or bruising near the incision.  Follow these instructions at home: Incision care  Monitor your cardiac device site for redness, swelling, and drainage. Call the device clinic at [phone] if you experience these symptoms or fever/chills.  Keep the large square bandage on your site until Monday, 05/06/2022 and then you may remove it yourself. Keep the steri-strips underneath in place.   You may shower tomorrow with the steri-strips in place. They will usually fall off on their own, or may be removed after 10 days. Pat dry.   Avoid lotions, ointments, or perfumes over your incision until it is well-healed.  Please do not submerge in water until your site is completely healed.   Your device is MRI compatible.   Remote monitoring is used to monitor your cardiac device from home. This monitoring is scheduled every month by our office. It allows [place] to keep an eye on the function of your device to ensure it is working properly.  If your wound site starts to bleed apply pressure.    For help with the monitor please call Med[employer] Monitor Support Specialist directly at [phone].    If you have any questions/concerns please call the device clinic at [phone].  Activity  Return to your normal  activities.  General instructions Follow instructions from your health care provider about how to manage your implantable loop recorder and transmit the information. Learn how to activate a recording if this is necessary for your type of device. You may go through a metal detection gate, and you may let someone hold a metal detector over your chest. Show your ID card if needed. Do not have an MRI unless you check with your health care provider first. Take over-the-counter and prescription medicines only as told by your health care provider. Keep all follow-up visits as told by your health care provider. This is important. Contact a health care provider if: You have redness, swelling, or pain around your incision. You have a fever. You have pain that is not relieved by your pain medicine. You have triggered your device because of fainting (syncope) or because of a heartbeat that feels like it is racing, slow, fluttering, or skipping (palpitations). Get help right away if you have: Chest pain. Difficulty breathing. Summary After the procedure, it is common to have soreness or discomfort near the incision. Change your dressing as told by your health care provider. Follow instructions from your health care provider about how to manage your implantable loop recorder and transmit the information. Keep all follow-up visits as told by your health care provider. This is important. This information is not intended to replace advice given to you by your health care provider. Make sure you discuss any questions you have with your health care provider. Document Released: 04/24/2015 Document Revised: 06/28/2017 Document Reviewed: 06/28/2017 Elsevier Patient Education  2020 [employer].  Important Information About Sugar
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
## 2022-05-14 ENCOUNTER — Other Ambulatory Visit: Payer: Self-pay | Admitting: Family Medicine

## 2022-05-14 DIAGNOSIS — F419 Anxiety disorder, unspecified: Secondary | ICD-10-CM

## 2022-06-04 ENCOUNTER — Encounter: Payer: Self-pay | Admitting: Internal Medicine

## 2022-06-04 ENCOUNTER — Ambulatory Visit (INDEPENDENT_AMBULATORY_CARE_PROVIDER_SITE_OTHER): Payer: Medicare Other | Admitting: Internal Medicine

## 2022-06-04 VITALS — BP 120/80 | HR 88 | Ht 65.0 in | Wt 125.4 lb

## 2022-06-04 DIAGNOSIS — K861 Other chronic pancreatitis: Secondary | ICD-10-CM

## 2022-06-04 DIAGNOSIS — K8681 Exocrine pancreatic insufficiency: Secondary | ICD-10-CM | POA: Diagnosis not present

## 2022-06-04 DIAGNOSIS — K219 Gastro-esophageal reflux disease without esophagitis: Secondary | ICD-10-CM

## 2022-06-04 DIAGNOSIS — K760 Fatty (change of) liver, not elsewhere classified: Secondary | ICD-10-CM | POA: Diagnosis not present

## 2022-06-04 MED ORDER — COLESTIPOL HCL 1 G PO TABS: 2.0000 g | ORAL_TABLET | Freq: Every day | ORAL | 3 refills | Status: DC

## 2022-06-04 NOTE — Patient Instructions (Signed)
_______________________________________________________  If you are age 83 or older, your body mass index should be between 23-30. Your Body mass index is 20.86 kg/m. If this is out of the aforementioned range listed, please consider follow up with your Primary Care Provider.  If you are age 58 or younger, your body mass index should be between 19-25. Your Body mass index is 20.86 kg/m. If this is out of the aformentioned range listed, please consider follow up with your Primary Care Provider.   ________________________________________________________  The [place] [facility] providers would like to encourage you to use [facility] to communicate with providers for non-urgent requests or questions.  Due to long hold times on the telephone, sending your provider a message by [facility] may be a faster and more efficient way to get a response.  Please allow 48 business hours for a response.  Please remember that this is for non-urgent requests.  _______________________________________________________  We have sent the following medications to your pharmacy for you to pick up at your convenience: Colestid - increase to 1-2 gm at bedtime
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
## 2022-06-05 ENCOUNTER — Ambulatory Visit (INDEPENDENT_AMBULATORY_CARE_PROVIDER_SITE_OTHER): Payer: Medicare Other

## 2022-06-05 DIAGNOSIS — R55 Syncope and collapse: Secondary | ICD-10-CM | POA: Diagnosis not present

## 2022-06-06 LAB — CUP PACEART REMOTE DEVICE CHECK
Date Time Interrogation Session: 20240110153755
Implantable Pulse Generator Implant Date: 20231208

## 2022-06-19 LAB — HM MAMMOGRAPHY

## 2022-06-26 NOTE — Progress Notes (Signed)
Carelink Summary Report / Loop Recorder

## 2022-07-08 ENCOUNTER — Ambulatory Visit: Payer: Medicare Other

## 2022-07-08 DIAGNOSIS — R55 Syncope and collapse: Secondary | ICD-10-CM

## 2022-07-09 LAB — CUP PACEART REMOTE DEVICE CHECK
Date Time Interrogation Session: 20240212153802
Implantable Pulse Generator Implant Date: 20231208

## 2022-07-12 ENCOUNTER — Other Ambulatory Visit: Payer: Self-pay | Admitting: Internal Medicine

## 2022-08-01 ENCOUNTER — Telehealth: Payer: Self-pay | Admitting: Internal Medicine
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
## 2022-08-01 NOTE — Telephone Encounter (Signed)
If she has had no synocpe since augsut she is ok to drive [place]

## 2022-08-01 NOTE — Telephone Encounter (Signed)
Called no answer, left message will  defer to Dr Caryl Comes for response.  The office will contact you with his answer.

## 2022-08-02 NOTE — Telephone Encounter (Signed)
Left message on pt's voicemail to call back to discuss any syncope.  Per Dr Caryl Comes, if no syncope since August, she is OK to resume driving.

## 2022-08-05 NOTE — Telephone Encounter (Signed)
Spoke with pt who reports the only thing she has noticed was after taking a long shower and washing her hair, when she bent over to towel dry her hair, she became alittle dizzy.  Another time after some yard work when it was hot, she got hot and felt weak.  She sat down and drank water and was better. Advised pt to make sure she is staying well hydrated and eat a small satly snack when this occurs.  Advised of Dr Olin Pia instructions of if no syncope since August, she is OK to resume driving.  Pt states understanding and will contact the office if any questions/concerns/problems.

## 2022-08-09 ENCOUNTER — Other Ambulatory Visit: Payer: Self-pay | Admitting: Family Medicine

## 2022-08-11 ENCOUNTER — Other Ambulatory Visit: Payer: Self-pay | Admitting: Family Medicine

## 2022-08-11 DIAGNOSIS — J101 Influenza due to other identified influenza virus with other respiratory manifestations: Secondary | ICD-10-CM

## 2022-08-11 DIAGNOSIS — T7840XD Allergy, unspecified, subsequent encounter: Secondary | ICD-10-CM

## 2022-08-12 ENCOUNTER — Ambulatory Visit (INDEPENDENT_AMBULATORY_CARE_PROVIDER_SITE_OTHER): Payer: Medicare Other

## 2022-08-12 DIAGNOSIS — R55 Syncope and collapse: Secondary | ICD-10-CM

## 2022-08-13 LAB — CUP PACEART REMOTE DEVICE CHECK
Date Time Interrogation Session: 20240317231718
Implantable Pulse Generator Implant Date: 20231208

## 2022-08-22 NOTE — Progress Notes (Signed)
Carelink Summary Report / Loop Recorder 

## 2022-09-16 ENCOUNTER — Ambulatory Visit (INDEPENDENT_AMBULATORY_CARE_PROVIDER_SITE_OTHER): Payer: Medicare Other

## 2022-09-16 DIAGNOSIS — R55 Syncope and collapse: Secondary | ICD-10-CM | POA: Diagnosis not present

## 2022-09-17 LAB — CUP PACEART REMOTE DEVICE CHECK
Date Time Interrogation Session: 20240419230404
Implantable Pulse Generator Implant Date: 20231208

## 2022-09-22 NOTE — Assessment & Plan Note (Signed)
Encouraged to get adequate exercise, calcium and vitamin d intake last dexa 2023

## 2022-09-22 NOTE — Assessment & Plan Note (Signed)
Supplement and monitor 

## 2022-09-22 NOTE — Assessment & Plan Note (Signed)
Patient encouraged to maintain heart healthy diet, regular exercise, adequate sleep. Consider daily probiotics. Take medications as prescribed. Labs ordered and reviewed. Last Dexa was January 2023. MGM 1//2024 repeat next year at patient discretion. Last colonoscopy 05/2021, has aged out of routine surveillance. Has aged out of paps. Shingrix ? RSV vaccine

## 2022-09-23 ENCOUNTER — Ambulatory Visit (INDEPENDENT_AMBULATORY_CARE_PROVIDER_SITE_OTHER): Payer: Medicare Other | Admitting: Family Medicine

## 2022-09-23 VITALS — BP 122/78 | HR 85 | Temp 97.9°F | Resp 16 | Ht 64.0 in | Wt 126.2 lb

## 2022-09-23 DIAGNOSIS — R739 Hyperglycemia, unspecified: Secondary | ICD-10-CM | POA: Diagnosis not present

## 2022-09-23 DIAGNOSIS — K861 Other chronic pancreatitis: Secondary | ICD-10-CM | POA: Diagnosis not present

## 2022-09-23 DIAGNOSIS — R55 Syncope and collapse: Secondary | ICD-10-CM | POA: Diagnosis not present

## 2022-09-23 DIAGNOSIS — E559 Vitamin D deficiency, unspecified: Secondary | ICD-10-CM

## 2022-09-23 DIAGNOSIS — Z0000 Encounter for general adult medical examination without abnormal findings: Secondary | ICD-10-CM

## 2022-09-23 DIAGNOSIS — B0229 Other postherpetic nervous system involvement: Secondary | ICD-10-CM

## 2022-09-23 NOTE — Progress Notes (Addendum)
Subjective:   By signing my name below, I, Barrett Shell, attest that this documentation has been prepared under the direction and in the presence of Bradd Canary, MD. 09/23/2022   Patient ID: [name], female    DOB: 11/16/1940, 82 y.o.   MRN: [id number]  Chief Complaint  Patient presents with   Annual Exam    Annual Exam     HPI Patient is in today for a comprehensive physical exam.   Acute She denies having any trouble swallowing, breathing issues, or palpitations   Syncope She had a loop recorder implant placed on 05/03/2022. She does not have atrial fibrillation. She has not passed out since getting the implant. She has has had two episodes of pre-syncope with the implant.   Pancreas She had a pancreatitis attack that started five days ago. She had back and chest pain during that time and her bowel movements were yellow. She had to use the bathroom 4 to 5 times in an hour during the episodes. She did not change her diet prior to the pancreatitis episode.   Bowels She typically has loose stools. She tried a fiber supplement in the past, but caused her to pass gas frequently.   Blood pressure Her blood pressure is stable during this visit.  BP Readings from Last 3 Encounters:  09/23/22 122/78  06/04/22 120/80  05/03/22 (!) 170/102    Pulse Readings from Last 3 Encounters:  09/23/22 85  06/04/22 88  05/03/22 79    Sleep  She has difficulty falling asleep some nights and wakes up early.   Allergies  She has been experiencing allergies during this season. She had tubes placed in her ears in the past to help manage her allergies, but they fell out.   Colonoscopy Last colonoscopy completed on 06/14/2021. Three 3-5 mm polyps found in the ascending colon. Multiple small and large- mouthed diverticula were found in the sigmoid colon and distal descending colon. Banding scars and very minimal internal hemorrhoids found, otherwise results are normal. Repeat in 10  years.  Dexa Last Dexa completed on 06/06/2021. Based on the WHO criteria, the patient has osteoporosis.   Mammogram Last mammogram completed on 06/19/2022. Results are normal. Repeat in 1 year.    Social history No changes in family history.  Immunizations She is UTD on the Shingrix immunizations.   Diet She follows a balanced diet.   Advanced directives Advanced directives discussed with her during this visit.   Exercise She exercises regularly.  Dental  She is UTD on dental.   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HCC)    Cataract    bilateral and removed   CHEST PAIN    LHC 02/2011: normal cors, EF 65%   Contact dermatitis 10/13/2017   Diverticulosis    Esophageal reflux    Fatty liver    Fibrocystic breast    Hand injury, left, initial encounter 10/13/2017   Hepatic cyst    Hepatic steatosis    History of shingles    HYPOTENSION, ORTHOSTATIC    IBS (irritable bowel syndrome)    Internal hemorrhoids without mention of complication    Kidney lesion    Kidney stone    Liver cyst    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   Osteopenia    Osteopenia    Osteoporosis  Pain in ear    PALPITATIONS, RECURRENT    Pancreatitis chronic    Preventative health care    PSVT    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   SHOULDER PAIN    Syncope and collapse    broke a rib, and a vertabre   TOBACCO USE, QUIT    VITAMIN D DEFICIENCY     Past Surgical History:  Procedure Laterality Date   ABDOMINAL HYSTERECTOMY  1998   BREAST SURGERY     breast bx on left, benign   CARDIAC CATHETERIZATION     CATARACT EXTRACTION, BILATERAL     CATARACT EXTRACTION, BILATERAL     CHOLECYSTECTOMY  1999   COLONOSCOPY     EYE SURGERY Bilateral 04/2017, 05/2017   cataracts   KNEE SURGERY  1958   POLYPECTOMY     TUBAL LIGATION     with D&C    Family History  Problem  Relation Age of Onset   Hypertension Mother    Stroke Mother    Alcohol abuse Mother    Heart disease Father    Bladder Cancer Father    Diabetes Father    Hypothyroidism Father    Dementia Father    Uterine cancer Paternal Grandmother    Heart disease Sister        rheumatic fever at 6 months, cardiomegaly MI   Heart disease Maternal Grandmother    Stroke Maternal Grandmother    Heart disease Maternal Grandfather    Stroke Maternal Grandfather    Breast cancer Cousin    Pancreatic cancer Paternal Aunt    Stomach cancer Paternal Uncle    Breast cancer Paternal Aunt    Lung cancer Paternal Uncle    Crohn's disease Son    Colon cancer Neg Hx    Colon polyps Neg Hx    Esophageal cancer Neg Hx    Rectal cancer Neg Hx     Social History   Socioeconomic History   Marital status: Married    Spouse name: Not on file   Number of children: 1   Years of education: Not on file   Highest education level: Not on file  Occupational History   Occupation: Retired    Associate Professor: RETIRED  Tobacco Use   Smoking status: Former    Types: Cigarettes    Quit date: 05/27/1973    Years since quitting: 49.3   Smokeless tobacco: Never  Vaping Use   Vaping Use: Never used  Substance and Sexual Activity   Alcohol use: No   Drug use: No   Sexual activity: Not on file  Other Topics Concern   Not on file  Social History Narrative   Not on file   Social Determinants of Health   Financial Resource Strain: Low Risk  (04/17/2021)   Overall Financial Resource Strain (CARDIA)    Difficulty of Paying Living Expenses: Not hard at all  Food Insecurity: No Food Insecurity (04/17/2021)   Hunger Vital Sign    Worried About Running Out of Food in the Last Year: Never true    Ran Out of Food in the Last Year: Never true  Transportation Needs: No Transportation Needs (04/17/2021)   PRAPARE - Administrator, Civil Service (Medical): No    Lack of Transportation (Non-Medical): No  Physical  Activity: Sufficiently Active (04/17/2021)   Exercise Vital Sign    Days of Exercise per Week: 4 days    Minutes of Exercise per Session: 60 min  Stress: No  Stress Concern Present (04/17/2021)   [employer] of Occupational Health - Occupational Stress Questionnaire    Feeling of Stress : Not at all  Social Connections: Moderately Integrated (04/17/2021)   Social Connection and Isolation Panel [NHANES]    Frequency of Communication with Friends and Family: More than three times a week    Frequency of Social Gatherings with Friends and Family: More than three times a week    Attends Religious Services: More than 4 times per year    Active Member of [employer] or Organizations: No    Attends Banker Meetings: Never    Marital Status: Married  Catering manager Violence: Not At Risk (04/17/2021)   Humiliation, Afraid, Rape, and Kick questionnaire    Fear of Current or Ex-Partner: No    Emotionally Abused: No    Physically Abused: No    Sexually Abused: No    Outpatient Medications Prior to Visit  Medication Sig Dispense Refill   amLODipine (NORVASC) 2.5 MG tablet Take 1 tablet (2.5 mg total) by mouth daily. 90 tablet 3   Ascorbic Acid (VITAMIN C) 500 MG tablet Take 500 mg by mouth daily.     Azelaic Acid 15 % gel Apply 1 application topically at bedtime.     b complex vitamins tablet Take 1 tablet by mouth daily.     cetirizine (ZYRTEC) 10 MG tablet Take 1 tablet (10 mg total) by mouth daily. 90 tablet 3   Cholecalciferol (VITAMIN D3) 1000 UNITS CAPS Take 1 tablet by mouth daily.     colestipol (COLESTID) 1 g tablet Take 2 tablets (2 g total) by mouth at bedtime. 180 tablet 3   Cranberry 500 MG CAPS Take 1 capsule by mouth daily.     diphenoxylate-atropine (LOMOTIL) 2.5-0.025 MG tablet Take 1 tablet by mouth 4 (four) times daily as needed for diarrhea or loose stools. 360 tablet 1   escitalopram (LEXAPRO) 10 MG tablet TAKE 1 TABLET BY MOUTH DAILY 90 tablet 3   estradiol  (VIVELLE-DOT) 0.025 MG/24HR APPLY 1  PATCH TOPICALLY TO SKIN TWICE A WEEK 24 patch 0   fluticasone (FLONASE) 50 MCG/ACT nasal spray USE 2 SPRAY(S) IN EACH NOSTRIL AS NEEDED FOR ALLERGIES OR  RHINITIS 48 g 1   JUBLIA 10 % SOLN SMARTSIG:Applicator Topical Every Night     Pancrelipase, Lip-Prot-Amyl, (CREON) 24000-76000 units CPEP TAKE 6 CAPSULES BY MOUTH WITH MEALS AND 1 TO 3 CAPSULES BY MOUTH WITH EACH SNACK . IF EATING HIGH FAT ITEMS, TAKE 8 CAPSULES BY MOUTH WITH MEALS . MAX 30 CAPSULE DAILY 2700 capsule 3   pantoprazole (PROTONIX) 40 MG tablet TAKE 1 TABLET BY MOUTH DAILY 90 tablet 1   Probiotic Product (PROBIOTIC PO) Take by mouth. Takes Probaclac probiotic once daily     tretinoin (RETIN-A) 0.025 % cream Apply 1 application topically 4 (four) times a week.     TURMERIC PO Take by mouth daily.     No facility-administered medications prior to visit.    Allergies  Allergen Reactions   Amoxicillin     REACTION: unspecified   Azithromycin     REACTION: Rash   Ciprofloxacin     REACTION: GI upset   Epinephrine     REACTION: elevated heart rate   Penicillins     REACTION: rash   Sulfonamide Derivatives     REACTION: sick to stomach   Tamiflu [Oseltamivir Phosphate] Hives    Review of Systems  Cardiovascular:  Positive for chest pain (from pancreatitis) and  leg swelling.  Genitourinary:  Positive for frequency (from pancreatitis).       Loose stools   Musculoskeletal:  Positive for back pain (from pancreatitis).       Objective:    Physical Exam Constitutional:      General: She is not in acute distress.    Appearance: Normal appearance.  HENT:     Head: Normocephalic and atraumatic.     Right Ear: Tympanic membrane, ear canal and external ear normal.     Left Ear: Tympanic membrane, ear canal and external ear normal.     Mouth/Throat:     Mouth: Mucous membranes are moist.  Eyes:     Extraocular Movements: Extraocular movements intact.     Right eye: No nystagmus.      Left eye: No nystagmus.     Pupils: Pupils are equal, round, and reactive to light.  Cardiovascular:     Rate and Rhythm: Normal rate and regular rhythm.     Heart sounds: Normal heart sounds. No murmur heard.    No gallop.  Pulmonary:     Effort: Pulmonary effort is normal. No respiratory distress.     Breath sounds: Normal breath sounds. No wheezing or rales.  Abdominal:     General: Bowel sounds are normal. There is no distension.     Palpations: Abdomen is soft.     Tenderness: There is no abdominal tenderness. There is no guarding.  Musculoskeletal:     Cervical back: Normal range of motion.     Comments: (+) 5/5 strength in upper and lower extremities  Lymphadenopathy:     Cervical: No cervical adenopathy.  Skin:    General: Skin is warm.  Neurological:     Mental Status: She is alert and oriented to person, place, and time.     Deep Tendon Reflexes:     Reflex Scores:      Patellar reflexes are 2+ on the right side and 2+ on the left side. Psychiatric:        Judgment: Judgment normal.     BP 122/78 (BP Location: Right Arm, Patient Position: Sitting, Cuff Size: Normal)   Pulse 85   Temp 97.9 F (36.6 C) (Oral)   Resp 16   Ht 5\' 4"  (1.626 m)   Wt 126 lb 3.2 oz (57.2 kg)   SpO2 95%   BMI 21.66 kg/m  Wt Readings from Last 3 Encounters:  09/23/22 126 lb 3.2 oz (57.2 kg)  06/04/22 125 lb 6 oz (56.9 kg)  05/03/22 123 lb 12.8 oz (56.2 kg)       Assessment & Plan:  Vitamin D deficiency Assessment & Plan: Supplement and monitor   Orders: -     VITAMIN D 25 Hydroxy (Vit-D Deficiency, Fractures)  Preventative health care Assessment & Plan: Patient encouraged to maintain heart healthy diet, regular exercise, adequate sleep. Consider daily probiotics. Take medications as prescribed. Labs ordered and reviewed. Last Dexa was January 2023. MGM 1//2024 repeat next year at patient discretion. Last colonoscopy 05/2021, has aged out of routine surveillance. Has aged out  of paps. Shingrix ? RSV vaccine   Hyperglycemia -     Hemoglobin A1c -     Comprehensive metabolic panel -     Lipid panel -     TSH  Syncope, unspecified syncope type -     CBC with Differential/Platelet  Post herpetic neuralgia  Autoimmune pancreatitis (HCC) -     Amylase -     Lipase  I, Danise Edge, MD, personally preformed the services described in this documentation.  All medical record entries made by the scribe were at my direction and in my presence.  I have reviewed the chart and discharge instructions (if applicable) and agree that the record reflects my personal performance and is accurate and complete. 09/23/2022  Danise Edge, MD   [facility] as a scribe for Danise Edge, MD.,have documented all relevant documentation on the behalf of Danise Edge, MD,as directed by  Danise Edge, MD while in the presence of Danise Edge, MD.

## 2022-09-23 NOTE — Patient Instructions (Signed)
60-80 ounces of fluids daily 6-8 hours of sleep Encouraged DASH or MIND diet, decrease po intake and increase exercise as tolerated. Needs 7-8 hours of sleep nightly. Avoid trans fats, eat small, frequent meals every 4-5 hours with lean proteins, complex carbs and healthy fats. Minimize simple carbs, high fat foods and processed foods   Magnesium Glycinate 200-400 mg at bedtime    Preventive Care 65 Years and Older, Female Preventive care refers to lifestyle choices and visits with your health care provider that can promote health and wellness. Preventive care visits are also called wellness exams. What can I expect for my preventive care visit? Counseling Your health care provider may ask you questions about your: Medical history, including: Past medical problems. Family medical history. Pregnancy and menstrual history. History of falls. Current health, including: Memory and ability to understand (cognition). Emotional well-being. Home life and relationship well-being. Sexual activity and sexual health. Lifestyle, including: Alcohol, nicotine or tobacco, and drug use. Access to firearms. Diet, exercise, and sleep habits. Work and work Astronomer. Sunscreen use. Safety issues such as seatbelt and bike helmet use. Physical exam Your health care provider will check your: Height and weight. These may be used to calculate your BMI (body mass index). BMI is a measurement that tells if you are at a healthy weight. Waist circumference. This measures the distance around your waistline. This measurement also tells if you are at a healthy weight and may help predict your risk of certain diseases, such as type 2 diabetes and high blood pressure. Heart rate and blood pressure. Body temperature. Skin for abnormal spots. What immunizations do I need?  Vaccines are usually given at various ages, according to a schedule. Your health care provider will recommend vaccines for you based on your  age, medical history, and lifestyle or other factors, such as travel or where you work. What tests do I need? Screening Your health care provider may recommend screening tests for certain conditions. This may include: Lipid and cholesterol levels. Hepatitis C test. Hepatitis B test. HIV (human immunodeficiency virus) test. STI (sexually transmitted infection) testing, if you are at risk. Lung cancer screening. Colorectal cancer screening. Diabetes screening. This is done by checking your blood sugar (glucose) after you have not eaten for a while (fasting). Mammogram. Talk with your health care provider about how often you should have regular mammograms. BRCA-related cancer screening. This may be done if you have a family history of breast, ovarian, tubal, or peritoneal cancers. Bone density scan. This is done to screen for osteoporosis. Talk with your health care provider about your test results, treatment options, and if necessary, the need for more tests. Follow these instructions at home: Eating and drinking  Eat a diet that includes fresh fruits and vegetables, whole grains, lean protein, and low-fat dairy products. Limit your intake of foods with high amounts of sugar, saturated fats, and salt. Take vitamin and mineral supplements as recommended by your health care provider. Do not drink alcohol if your health care provider tells you not to drink. If you drink alcohol: Limit how much you have to 0-1 drink a day. Know how much alcohol is in your drink. In the [place] one drink equals one 12 oz bottle of beer (355 mL), one 5 oz glass of wine (148 mL), or one 1 oz glass of hard liquor (44 mL). Lifestyle Brush your teeth every morning and night with fluoride toothpaste. Floss one time each day. Exercise for at least 30 minutes 5 or more  days each week. Do not use any products that contain nicotine or tobacco. These products include cigarettes, chewing tobacco, and vaping devices, such as  e-cigarettes. If you need help quitting, ask your health care provider. Do not use drugs. If you are sexually active, practice safe sex. Use a condom or other form of protection in order to prevent STIs. Take aspirin only as told by your health care provider. Make sure that you understand how much to take and what form to take. Work with your health care provider to find out whether it is safe and beneficial for you to take aspirin daily. Ask your health care provider if you need to take a cholesterol-lowering medicine (statin). Find healthy ways to manage stress, such as: Meditation, yoga, or listening to music. Journaling. Talking to a trusted person. Spending time with friends and family. Minimize exposure to UV radiation to reduce your risk of skin cancer. Safety Always wear your seat belt while driving or riding in a vehicle. Do not drive: If you have been drinking alcohol. Do not ride with someone who has been drinking. When you are tired or distracted. While texting. If you have been using any mind-altering substances or drugs. Wear a helmet and other protective equipment during sports activities. If you have firearms in your house, make sure you follow all gun safety procedures. What's next? Visit your health care provider once a year for an annual wellness visit. Ask your health care provider how often you should have your eyes and teeth checked. Stay up to date on all vaccines. This information is not intended to replace advice given to you by your health care provider. Make sure you discuss any questions you have with your health care provider. Document Revised: 11/08/2020 Document Reviewed: 11/08/2020 Elsevier Patient Education  2023 [employer].

## 2022-09-24 LAB — COMPREHENSIVE METABOLIC PANEL
ALT: 16 U/L (ref 0–35)
AST: 18 U/L (ref 0–37)
Albumin: 3.9 g/dL (ref 3.5–5.2)
Alkaline Phosphatase: 62 U/L (ref 39–117)
BUN: 17 mg/dL (ref 6–23)
CO2: 31 mEq/L (ref 19–32)
Calcium: 8.8 mg/dL (ref 8.4–10.5)
Chloride: 103 mEq/L (ref 96–112)
Creatinine, Ser: 0.97 mg/dL (ref 0.40–1.20)
GFR: 54.38 mL/min — ABNORMAL LOW (ref >60.00)
Glucose, Bld: 120 mg/dL — ABNORMAL HIGH (ref 70–99)
Potassium: 3.9 mEq/L (ref 3.5–5.1)
Sodium: 140 mEq/L (ref 135–145)
Total Bilirubin: 0.3 mg/dL (ref 0.2–1.2)
Total Protein: 6.1 g/dL (ref 6.0–8.3)

## 2022-09-24 LAB — CBC WITH DIFFERENTIAL/PLATELET
Basophils Absolute: 0 10*3/uL (ref 0.0–0.1)
Basophils Relative: 0.5 % (ref 0.0–3.0)
Eosinophils Absolute: 0.1 10*3/uL (ref 0.0–0.7)
Eosinophils Relative: 1.3 % (ref 0.0–5.0)
HCT: 40.2 % (ref 36.0–46.0)
Hemoglobin: 13.5 g/dL (ref 12.0–15.0)
Lymphocytes Relative: 19.5 % (ref 12.0–46.0)
Lymphs Abs: 1.5 10*3/uL (ref 0.7–4.0)
MCHC: 33.5 g/dL (ref 30.0–36.0)
MCV: 92.1 fl (ref 78.0–100.0)
Monocytes Absolute: 0.5 10*3/uL (ref 0.1–1.0)
Monocytes Relative: 7.2 % (ref 3.0–12.0)
Neutro Abs: 5.3 10*3/uL (ref 1.4–7.7)
Neutrophils Relative %: 71.5 % (ref 43.0–77.0)
Platelets: 266 10*3/uL (ref 150.0–400.0)
RBC: 4.36 Mil/uL (ref 3.87–5.11)
RDW: 14.1 % (ref 11.5–15.5)
WBC: 7.5 10*3/uL (ref 4.0–10.5)

## 2022-09-24 LAB — LIPASE: Lipase: 73 U/L — ABNORMAL HIGH (ref 11.0–59.0)

## 2022-09-24 LAB — LIPID PANEL
Cholesterol: 139 mg/dL (ref 0–200)
HDL: 80 mg/dL (ref >39.00)
LDL Cholesterol: 33 mg/dL (ref 0–99)
NonHDL: 58.9
Total CHOL/HDL Ratio: 2
Triglycerides: 132 mg/dL (ref 0.0–149.0)
VLDL: 26.4 mg/dL (ref 0.0–40.0)

## 2022-09-24 LAB — TSH: TSH: 1.45 u[IU]/mL (ref 0.35–5.50)

## 2022-09-24 LAB — HEMOGLOBIN A1C: Hgb A1c MFr Bld: 5.8 % (ref 4.6–6.5)

## 2022-09-24 LAB — AMYLASE: Amylase: 78 U/L (ref 27–131)

## 2022-09-24 LAB — VITAMIN D 25 HYDROXY (VIT D DEFICIENCY, FRACTURES): VITD: 34.15 ng/mL (ref 30.00–100.00)

## 2022-09-24 NOTE — Progress Notes (Signed)
Carelink Summary Report / Loop Recorder 

## 2022-10-17 LAB — CUP PACEART REMOTE DEVICE CHECK
Date Time Interrogation Session: 20240522230125
Implantable Pulse Generator Implant Date: 20231208

## 2022-10-22 ENCOUNTER — Ambulatory Visit (INDEPENDENT_AMBULATORY_CARE_PROVIDER_SITE_OTHER): Payer: Medicare Other

## 2022-10-22 DIAGNOSIS — R55 Syncope and collapse: Secondary | ICD-10-CM | POA: Diagnosis not present

## 2022-10-23 NOTE — Progress Notes (Signed)
Carelink Summary Report / Loop Recorder 

## 2022-10-29 DIAGNOSIS — Z95818 Presence of other cardiac implants and grafts: Secondary | ICD-10-CM | POA: Insufficient documentation

## 2022-11-01 ENCOUNTER — Other Ambulatory Visit: Payer: Self-pay | Admitting: Internal Medicine

## 2022-11-04 ENCOUNTER — Ambulatory Visit: Payer: Medicare Other | Attending: Internal Medicine | Admitting: Internal Medicine

## 2022-11-04 ENCOUNTER — Encounter: Payer: Self-pay | Admitting: Internal Medicine

## 2022-11-04 VITALS — BP 115/75 | HR 84 | Ht 64.0 in | Wt 125.4 lb

## 2022-11-04 DIAGNOSIS — R55 Syncope and collapse: Secondary | ICD-10-CM

## 2022-11-04 DIAGNOSIS — Z95818 Presence of other cardiac implants and grafts: Secondary | ICD-10-CM | POA: Diagnosis not present

## 2022-11-04 NOTE — Progress Notes (Signed)
Patient Care Team: Bradd Canary, MD as PCP - General (Family Medicine) Duke Salvia, MD as PCP - Electrophysiology (Cardiology) Duke Salvia, MD as Consulting Physician (Cardiology) Pyrtle, Carie Caddy, MD as Consulting Physician (Gastroenterology) Venancio Poisson, MD as Consulting Physician (Dermatology) Ovidio Kin, MD as Consulting Physician (General Surgery) Suzanna Obey, MD as Consulting Physician (Otolaryngology) Maris Berger, MD as Consulting Physician (Ophthalmology)   HPI  [name] is a 82 y.o. female seen in follow-up for history of atrial tachycardia and SVT, hypertension  Orthostatic intolerance in the background of GE reflux disease IBS and type II autoimmune pancreatitis.  With recurrent syncope and atrial tachycardia a loop recorder was implanted 12/23  No limitations in exercise tolerance.  Event recorder personally reviewed 9/23 isolated PVCs isolated PACs 1 episode of nonsustained ventricular tachycardia and multiple episodes of SVT atrial tachycardia the fastest 182 lasting up to 21 seconds.  Feels like she is better since the introduction of amlodipine.  No recurrent syncope.  Recurrent falls esp in heat No dyspnea   Date Cr K Hgb  8/23 1.05 4.5 13.9   4/24 0.97 3.9 13.5     DATE TEST EF   8/23 Echo    60-65% %               Records and Results Reviewed   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HCC)    Cataract    bilateral and removed   CHEST PAIN    LHC 02/2011: normal cors, EF 65%   Contact dermatitis 10/13/2017   Diverticulosis    Esophageal reflux    Fatty liver    Fibrocystic breast    Hand injury, left, initial encounter 10/13/2017   Hepatic cyst    Hepatic steatosis    History of shingles    HYPOTENSION, ORTHOSTATIC    IBS (irritable bowel syndrome)    Internal hemorrhoids without mention of  complication    Kidney lesion    Kidney stone    Liver cyst    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   Osteopenia    Osteopenia    Osteoporosis    Pain in ear    PALPITATIONS, RECURRENT    Pancreatitis chronic    Preventative health care    PSVT    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   SHOULDER PAIN    Syncope and collapse    broke a rib, and a vertabre   TOBACCO USE, QUIT    VITAMIN D DEFICIENCY     Past Surgical History:  Procedure Laterality Date   ABDOMINAL HYSTERECTOMY  1998   BREAST SURGERY     breast bx on left, benign   CARDIAC CATHETERIZATION     CATARACT EXTRACTION, BILATERAL     CATARACT EXTRACTION, BILATERAL     CHOLECYSTECTOMY  1999   COLONOSCOPY     EYE SURGERY Bilateral 04/2017, 05/2017   cataracts   KNEE SURGERY  1958   POLYPECTOMY     TUBAL LIGATION     with D&C    Current Meds  Medication Sig   amLODipine (NORVASC) 2.5 MG tablet Take 1 tablet (2.5 mg total) by mouth daily.   Ascorbic Acid (VITAMIN C) 500 MG tablet Take 500 mg by mouth daily.   Azelaic Acid 15 % gel Apply 1  application topically at bedtime.   b complex vitamins tablet Take 1 tablet by mouth daily.   cetirizine (ZYRTEC) 10 MG tablet Take 1 tablet (10 mg total) by mouth daily.   Cholecalciferol (VITAMIN D3) 1000 UNITS CAPS Take 1 tablet by mouth daily.   colestipol (COLESTID) 1 g tablet Take 2 tablets (2 g total) by mouth at bedtime.   Cranberry 500 MG CAPS Take 1 capsule by mouth daily.   diphenoxylate-atropine (LOMOTIL) 2.5-0.025 MG tablet Take 1 tablet by mouth 4 (four) times daily as needed for diarrhea or loose stools.   escitalopram (LEXAPRO) 10 MG tablet TAKE 1 TABLET BY MOUTH DAILY   estradiol (VIVELLE-DOT) 0.025 MG/24HR APPLY 1  PATCH TOPICALLY TO SKIN TWICE A WEEK   fluticasone (FLONASE) 50 MCG/ACT nasal spray USE 2 SPRAY(S) IN EACH NOSTRIL AS NEEDED FOR ALLERGIES OR  RHINITIS   JUBLIA 10 % SOLN SMARTSIG:Applicator Topical Every Night   Pancrelipase, Lip-Prot-Amyl, (CREON)  24000-76000 units CPEP TAKE 6 CAPSULES BY MOUTH WITH  MEALS AND 1 TO 3 CAPSULES WITH  EACH SNACK. IF EATING HIGH FAT  ITEMS TAKE 8 CAPSULES WITH MEALS MAX 30 CAPSULES DAILY   pantoprazole (PROTONIX) 40 MG tablet TAKE 1 TABLET BY MOUTH DAILY   Probiotic Product (PROBIOTIC PO) Take by mouth. Takes Probaclac probiotic once daily   tretinoin (RETIN-A) 0.025 % cream Apply 1 application topically 4 (four) times a week.   TURMERIC PO Take by mouth daily.    Allergies  Allergen Reactions   Amoxicillin     REACTION: unspecified   Azithromycin     REACTION: Rash   Ciprofloxacin     REACTION: GI upset   Epinephrine     REACTION: elevated heart rate   Penicillins     REACTION: rash   Sulfonamide Derivatives     REACTION: sick to stomach   Tamiflu [Oseltamivir Phosphate] Hives      Review of Systems negative except from HPI and PMH  Physical Exam BP 115/75 (Patient Position: Standing)   Pulse 84   Ht 5\' 4"  (1.626 m)   Wt 125 lb 6.4 oz (56.9 kg)   SpO2 95%   BMI 21.52 kg/m  Well developed and well nourished in no acute distress HENT normal Neck supple with JVP-flat Clear Device pocket well healed; without hematoma or erythema.  There is no tethering  Regular rate and rhythm, no  gallop No  murmur Abd-soft with active BS No Clubbing cyanosis  edema Skin-warm and dry A & Oriented  Grossly normal sensory and motor function  ECG sinus @ 80 13/09/40  Device function is  normal.    CrCl cannot be calculated (Patient's most recent lab result is older than the maximum 21 days allowed.).   Assessment and  Plan  Syncope  Atrial tachycardia-nonsustained  Orthostatic hypotension  Hypertension  No recurrent syncope but significant orthostatic hypotension aggravated by heat.  Talked about strategies including a shower bench, a working bench for the yard, prehydration for exercise and the yard.  Hypertension is reasonably controlled on the amlodipine 2.5.  She thinks it helped  with her other symptoms.  Perhaps a calcium blocking effect.  Event recorder activations had some degree of slowing prior to acceleration suggesting an autonomic response with secondary tachycardia  No evidence of atrial tachycardia in terms of recording events nor in terms of triggered events.  Current medicines are reviewed at length with the patient today .  The patient does not have concerns regarding medicines.

## 2022-11-04 NOTE — Patient Instructions (Signed)
Medication Instructions:  Your physician recommends that you continue on your current medications as directed. Please refer to the Current Medication list given to you today.  *If you need a refill on your cardiac medications before your next appointment, please call your pharmacy*   Lab Work: None ordered.  If you have labs (blood work) drawn today and your tests are completely normal, you will receive your results only by: MyChart Message (if you have MyChart) OR A paper copy in the mail If you have any lab test that is abnormal or we need to change your treatment, we will call you to review the results.   Testing/Procedures: None ordered.    Follow-Up: At [facility], you and your health needs are our priority.  As part of our continuing mission to provide you with exceptional heart care, we have created designated Provider Care Teams.  These Care Teams include your primary Cardiologist (physician) and Advanced Practice Providers (APPs -  Physician Assistants and Nurse Practitioners) who all work together to provide you with the care you need, when you need it.  We recommend signing up for the patient portal called "MyChart".  Sign up information is provided on this After Visit Summary.  MyChart is used to connect with patients for Virtual Visits (Telemedicine).  Patients are able to view lab/test results, encounter notes, upcoming appointments, etc.  Non-urgent messages can be sent to your provider as well.   To learn more about what you can do with MyChart, go to [email or web address].    Your next appointment:   6 months with Dr Klein 

## 2022-11-15 NOTE — Progress Notes (Signed)
Carelink Summary Report / Loop Recorder 

## 2022-11-19 LAB — CUP PACEART REMOTE DEVICE CHECK
Date Time Interrogation Session: 20240624230418
Implantable Pulse Generator Implant Date: 20231208

## 2022-11-25 ENCOUNTER — Ambulatory Visit (INDEPENDENT_AMBULATORY_CARE_PROVIDER_SITE_OTHER): Payer: Medicare Other

## 2022-11-25 DIAGNOSIS — R55 Syncope and collapse: Secondary | ICD-10-CM | POA: Diagnosis not present

## 2022-12-04 ENCOUNTER — Other Ambulatory Visit: Payer: Self-pay | Admitting: Internal Medicine

## 2022-12-11 ENCOUNTER — Telehealth: Payer: Self-pay | Admitting: Internal Medicine

## 2022-12-11 NOTE — Telephone Encounter (Signed)
Dr Rhea Belton is in this afternoon, would check with him regarding the refill.

## 2022-12-11 NOTE — Telephone Encounter (Signed)
Can refill. 

## 2022-12-11 NOTE — Telephone Encounter (Signed)
Patient called in requesting to re-sent prescription for "Lomotil" to [facility] [address].Please advise.

## 2022-12-11 NOTE — Telephone Encounter (Signed)
Patient requesting a refill of Lomotil.  Your last office note indicated that you wanted her to try to avoid Lomotil if possible:  "Minimize Lomotil but leave it available 1 tablet 4 times daily as needed."  Please advise.

## 2022-12-11 NOTE — Telephone Encounter (Signed)
This is a [facility] patient and his last note indicated he wanted her to try not to take Lomotil so I don't want to refill it.

## 2022-12-12 MED ORDER — DIPHENOXYLATE-ATROPINE 2.5-0.025 MG PO TABS: 1.0000 | ORAL_TABLET | Freq: Four times a day (QID) | ORAL | 1 refills | Status: DC | PRN

## 2022-12-12 NOTE — Telephone Encounter (Signed)
Refilled Lomotil 

## 2022-12-16 NOTE — Progress Notes (Signed)
Carelink Summary Report / Loop Recorder 

## 2022-12-30 ENCOUNTER — Ambulatory Visit: Payer: Medicare Other

## 2022-12-30 DIAGNOSIS — R55 Syncope and collapse: Secondary | ICD-10-CM

## 2023-01-14 NOTE — Progress Notes (Signed)
Carelink Summary Report / Loop Recorder 

## 2023-01-15 ENCOUNTER — Other Ambulatory Visit: Payer: Self-pay | Admitting: Internal Medicine

## 2023-02-03 ENCOUNTER — Ambulatory Visit: Payer: Medicare Other

## 2023-02-03 DIAGNOSIS — R55 Syncope and collapse: Secondary | ICD-10-CM

## 2023-02-03 LAB — CUP PACEART REMOTE DEVICE CHECK
Date Time Interrogation Session: 20240906230435
Implantable Pulse Generator Implant Date: 20231208

## 2023-02-20 NOTE — Progress Notes (Signed)
Carelink Summary Report / Loop Recorder 

## 2023-02-24 ENCOUNTER — Telehealth: Payer: Self-pay | Admitting: Internal Medicine

## 2023-02-24 NOTE — Telephone Encounter (Signed)
Pt states passed out on Friday around 10:30pm and wants to know if device pull up what happened prior to her passing out.   .SYNCOPECHMG   Pt c/o Syncope: STAT if syncope occurred within 24 hours and pt complains of lightheadedness.   High Priority if episode of passing out, completely, today or in last 24 hours   1. Did you pass out today? no   2. When is the last time you passed out? no   3. Has this occurred multiple times? no  4. Did you have any symptoms prior to passing out? Nausea, lightheaded   5. Did you fall? If so, are you on a blood thinner? Yes she fell against the wall. No she isn't on blood thinner

## 2023-02-24 NOTE — Telephone Encounter (Addendum)
Spoke with pt who reports Friday night she got up from her bed and went downstairs to get something to drink and by the time she got to the kitchen she was feeling weak so she grabbed a coke, became nauseous, diaphoretic and went to the living room to sit down and passed out in the floor.  She did not sustain any injury or hit her head.  She states her husband came down and put her feet up on the end table and eventually helped her up.  She did not obtain any vital signs.   Pt advised the loop recorder did not show any alerts.  Pt with history of orthostatic hypotension.  Pt advised when she begins to feel this way she should sit down wherever she is.  Pt states she is hydrating well.  She is scheduled to see Dr Graciela Husbands 05/07/2023.  Encouraged pt to continue to monitor BP and try to obtain orthostatic blood pressures and send through MyChart.  Reviewed ED precautions.  Will forward to Dr Graciela Husbands for further review and recommendation.  Pt verbalizes understanding and agrees with current plan.

## 2023-02-24 NOTE — Telephone Encounter (Signed)
[employer] checked and no alerts noted on ILR. Routing back to [place] to advise.

## 2023-03-06 NOTE — Telephone Encounter (Signed)
If she is having more symptoms we should see her

## 2023-03-10 ENCOUNTER — Ambulatory Visit (INDEPENDENT_AMBULATORY_CARE_PROVIDER_SITE_OTHER): Payer: Medicare Other

## 2023-03-10 DIAGNOSIS — R55 Syncope and collapse: Secondary | ICD-10-CM | POA: Diagnosis not present

## 2023-03-10 LAB — CUP PACEART REMOTE DEVICE CHECK
Date Time Interrogation Session: 20241013230811
Implantable Pulse Generator Implant Date: 20231208

## 2023-03-14 ENCOUNTER — Telehealth: Payer: Self-pay | Admitting: Internal Medicine

## 2023-03-14 NOTE — Telephone Encounter (Signed)
Attempted phone call to pt.  Unable to leave voicemail message. °

## 2023-03-14 NOTE — Telephone Encounter (Signed)
Spoke with pt who states she has not had any further episodes.  Pt states she will plan to keep appointment 12/11 as scheduled with Dr Graciela Husbands.

## 2023-03-14 NOTE — Telephone Encounter (Signed)
Patient returned RN's call. 

## 2023-03-14 NOTE — Telephone Encounter (Signed)
Completed in previous encounter.

## 2023-03-21 ENCOUNTER — Other Ambulatory Visit: Payer: Self-pay | Admitting: Family Medicine

## 2023-03-25 ENCOUNTER — Other Ambulatory Visit: Payer: Self-pay | Admitting: Internal Medicine

## 2023-03-26 NOTE — Progress Notes (Signed)
Carelink Summary Report / Loop Recorder 

## 2023-04-02 ENCOUNTER — Telehealth: Payer: Self-pay

## 2023-04-02 NOTE — Telephone Encounter (Addendum)
ILR alert for tachy  (ILR implanted for syncope). Event occurred 11/5 @ 06:49, duration 19sec, HR 207 EGM shows narrow regular rhythm  Does recall at the time of event yesterday am she was working out on the elliptical.  She became dizzy, lightheaded felt near syncopal but did not pass out.  After brief rest and fluids, she felt better and has been fine since.  Patient states that she is doing well otherwise, no changes in diet, health or meds.  Patient knows to call us if feels symptoms again and activate her sx activator.  ER precautions if any severe symptoms.   Forwarding to Dr. Graciela Husbands for review.

## 2023-04-03 ENCOUNTER — Other Ambulatory Visit: Payer: Self-pay | Admitting: Family Medicine

## 2023-04-03 DIAGNOSIS — F419 Anxiety disorder, unspecified: Secondary | ICD-10-CM

## 2023-04-08 NOTE — Telephone Encounter (Signed)
Yes, per below, she was symptomatic during the event.  (On the elliptical).  Dizzy, light headed, near syncopal.   Let me know if anything further.

## 2023-04-08 NOTE — Telephone Encounter (Signed)
[name]  thanks  Did she have any symptoms with the SVT ( known that she has it--and implant question was whether it related to syncope) Thanks SK

## 2023-04-13 ENCOUNTER — Other Ambulatory Visit: Payer: Self-pay | Admitting: Internal Medicine

## 2023-04-14 ENCOUNTER — Ambulatory Visit (INDEPENDENT_AMBULATORY_CARE_PROVIDER_SITE_OTHER): Payer: Medicare Other

## 2023-04-14 DIAGNOSIS — R55 Syncope and collapse: Secondary | ICD-10-CM | POA: Diagnosis not present

## 2023-04-14 LAB — CUP PACEART REMOTE DEVICE CHECK
Date Time Interrogation Session: 20241117232310
Implantable Pulse Generator Implant Date: 20231208

## 2023-04-22 NOTE — Telephone Encounter (Signed)
Happy to see. Sounds like she needs to be seen in the office rather than in the hospital.

## 2023-04-29 ENCOUNTER — Telehealth: Payer: Self-pay | Admitting: Family Medicine

## 2023-04-29 NOTE — Telephone Encounter (Signed)
Copied from CRM [id number]. Topic: Medicare AWV >> Apr 29, 2023  2:13 PM Payton Doughty wrote: Reason for CRM: Called LVM 04/29/2023 to schedule Annual Wellness Visit  Verlee Rossetti; Care Guide Ambulatory Clinical Support [place] [facility] Direct Dial: [phone]

## 2023-04-30 ENCOUNTER — Telehealth: Payer: Self-pay | Admitting: Family Medicine

## 2023-04-30 NOTE — Telephone Encounter (Signed)
Pt called to advise that when she last saw PCP in May she told her that she was having trouble sleeping so Dr. Abner Greenspan recommended she try something over the counter (she believes it was magnesium with another word) and she has finished the bottle she bought but it hasn't helped. She wants to know if there is something else that can be try OTC for this.   Also, patient said that she notices some mild itching because she has to wear depends or a large pad every day. Pt said she doesn't have any burning or anything so she doesn't think it's an infection but wonders if it's the depends and if there is some kind of cream or something she can use when this happens.

## 2023-05-01 ENCOUNTER — Other Ambulatory Visit: Payer: Self-pay | Admitting: Family

## 2023-05-01 MED ORDER — NYSTATIN 100000 UNIT/GM EX CREA: 1.0000 | TOPICAL_CREAM | Freq: Two times a day (BID) | CUTANEOUS | 1 refills | Status: DC

## 2023-05-01 NOTE — Telephone Encounter (Signed)
Called patient. Instructions given per provider's note. She will call at a later time to schedule virtual visit for sleeping situation

## 2023-05-07 ENCOUNTER — Ambulatory Visit: Payer: Medicare Other | Admitting: Internal Medicine

## 2023-05-09 NOTE — Progress Notes (Signed)
Carelink Summary Report / Loop Recorder 

## 2023-05-13 ENCOUNTER — Telehealth: Payer: Self-pay

## 2023-05-13 ENCOUNTER — Ambulatory Visit: Payer: Medicare Other | Attending: Internal Medicine | Admitting: Internal Medicine

## 2023-05-13 VITALS — BP 138/78 | HR 73 | Ht 64.0 in | Wt 126.0 lb

## 2023-05-13 DIAGNOSIS — I471 Supraventricular tachycardia, unspecified: Secondary | ICD-10-CM

## 2023-05-13 DIAGNOSIS — Z01812 Encounter for preprocedural laboratory examination: Secondary | ICD-10-CM

## 2023-05-13 NOTE — Progress Notes (Signed)
HPI [name] is referred today by Dr. Crissie Sickles for evaluation of SVT. 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 She usually can valsalva and maker her episodes go away. Over the past couple of months she has been unable to get her symptoms to stop as easily and has had several episodes of near syncope and a signle episode of syncope. She has palpitations and sob when she goes into SVT.  Allergies  Allergen Reactions   Amoxicillin     REACTION: unspecified   Azithromycin     REACTION: Rash   Ciprofloxacin     REACTION: GI upset   Epinephrine     REACTION: elevated heart rate   Penicillins     REACTION: rash   Sulfonamide Derivatives     REACTION: sick to stomach   Tamiflu [Oseltamivir Phosphate] Hives     Current Outpatient Medications  Medication Sig Dispense Refill   amLODipine (NORVASC) 2.5 MG tablet Take 1 tablet by mouth once daily 90 tablet 2   Ascorbic Acid (VITAMIN C) 500 MG tablet Take 500 mg by mouth daily.     Azelaic Acid 15 % gel Apply 1 application topically at bedtime.     b complex vitamins tablet Take 1 tablet by mouth daily.     cetirizine (ZYRTEC) 10 MG tablet Take 1 tablet (10 mg total) by mouth daily. 90 tablet 3   Cholecalciferol (VITAMIN D3) 1000 UNITS CAPS Take 1 tablet by mouth daily.     colestipol (COLESTID) 1 g tablet Take 2 tablets (2 g total) by mouth at bedtime. 180 tablet 3   Cranberry 500 MG CAPS Take 1 capsule by mouth daily.     diphenoxylate-atropine (LOMOTIL) 2.5-0.025 MG tablet Take 1 tablet by mouth 4 (four) times daily as needed for diarrhea or loose stools. 360 tablet 1   DOTTI 0.025 MG/24HR APPLY 1  PATCH TOPICALLY TO SKIN TWICE A WEEK 24 patch 0   escitalopram (LEXAPRO) 10 MG tablet TAKE 1 TABLET BY MOUTH DAILY 90 tablet 0   fluticasone (FLONASE) 50 MCG/ACT nasal spray USE 2 SPRAY(S) IN EACH NOSTRIL AS NEEDED FOR ALLERGIES OR  RHINITIS 48 g  1   JUBLIA 10 % SOLN SMARTSIG:Applicator Topical Every Night     nystatin cream (MYCOSTATIN) Apply 1 Application topically 2 (two) times daily. 30 g 1   Pancrelipase, Lip-Prot-Amyl, (CREON) 24000-76000 units CPEP TAKE 6 CAPSULES BY MOUTH WITH  MEALS AND 1 TO 3 CAPSULES WITH  EACH SNACK. IF EATING HIGH FAT  ITEMS TAKE 8 CAPSULES WITH MEALS MAX 30 CAPSULES DAILY 2700 capsule 0   pantoprazole (PROTONIX) 40 MG tablet Take 1 tablet (40 mg total) by mouth daily. Office visit for further refills 90 tablet 0   Probiotic Product (PROBIOTIC PO) Take by mouth. Takes Probaclac probiotic once daily     tretinoin (RETIN-A) 0.025 % cream Apply 1 application topically 4 (four) times a week.     TURMERIC PO Take by mouth daily.     No current facility-administered medications for this visit.     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[facility])    Cataract    bilateral and removed   CHEST PAIN    LHC 02/2011: normal  cors, EF 65%   Contact dermatitis 10/13/2017   Diverticulosis    Esophageal reflux    Fatty liver    Fibrocystic breast    Hand injury, left, initial encounter 10/13/2017   Hepatic cyst    Hepatic steatosis    History of shingles    HYPOTENSION, ORTHOSTATIC    IBS (irritable bowel syndrome)    Internal hemorrhoids without mention of complication    Kidney lesion    Kidney stone    Liver cyst    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   Past Surgical History:  Procedure Laterality Date   ABDOMINAL  HYSTERECTOMY  1998   BREAST SURGERY     breast bx on left, benign   CARDIAC CATHETERIZATION     CATARACT EXTRACTION, BILATERAL     CATARACT EXTRACTION, BILATERAL     CHOLECYSTECTOMY  1999   COLONOSCOPY     EYE SURGERY Bilateral 04/2017, 05/2017   cataracts   KNEE SURGERY  1958   POLYPECTOMY     TUBAL LIGATION     with D&C     Family History  Problem Relation Age of Onset   Hypertension Mother    Stroke Mother    Alcohol abuse Mother    Heart disease Father    Bladder Cancer Father    Diabetes Father    Hypothyroidism Father    Dementia Father    Uterine cancer Paternal Grandmother    Heart disease Sister        rheumatic fever at 6 months, cardiomegaly MI   Heart disease Maternal Grandmother    Stroke Maternal Grandmother    Heart disease Maternal Grandfather    Stroke Maternal Grandfather    Breast cancer Cousin    Pancreatic cancer Paternal Aunt    Stomach cancer Paternal Uncle    Breast cancer Paternal Aunt    Lung cancer Paternal Uncle    Crohn's disease Son    Colon cancer Neg Hx    Colon polyps Neg Hx    Esophageal cancer Neg Hx    Rectal cancer Neg Hx      Social History   Socioeconomic History   Marital status: Married    Spouse name: Not on file   Number of children: 1   Years of education: Not on file   Highest education level: Not on file  Occupational History   Occupation: Retired    Associate Professor: RETIRED  Tobacco Use   Smoking status: Former    Current packs/day: 0.00    Types: Cigarettes    Quit date: 05/27/1973    Years since quitting: 49.9   Smokeless tobacco: Never  Vaping Use   Vaping status: Never Used  Substance and Sexual Activity   Alcohol use: No   Drug use: No   Sexual activity: Not on file  Other Topics Concern   Not on file  Social History Narrative   Not on file   Social Drivers  of Health   Financial Resource Strain: Low Risk  (04/17/2021)   Overall Financial Resource Strain (CARDIA)    Difficulty of Paying Living  Expenses: Not hard at all  Food Insecurity: No Food Insecurity (04/17/2021)   Hunger Vital Sign    Worried About Running Out of Food in the Last Year: Never true    Ran Out of Food in the Last Year: Never true  Transportation Needs: No Transportation Needs (04/17/2021)   PRAPARE - Administrator, Civil Service (Medical): No    Lack of Transportation (Non-Medical): No  Physical Activity: Sufficiently Active (04/17/2021)   Exercise Vital Sign    Days of Exercise per Week: 4 days    Minutes of Exercise per Session: 60 min  Stress: No Stress Concern Present (04/17/2021)   [employer] of Occupational Health - Occupational Stress Questionnaire    Feeling of Stress : Not at all  Social Connections: Moderately Integrated (04/17/2021)   Social Connection and Isolation Panel [NHANES]    Frequency of Communication with Friends and Family: More than three times a week    Frequency of Social Gatherings with Friends and Family: More than three times a week    Attends Religious Services: More than 4 times per year    Active Member of [employer] or Organizations: No    Attends Banker Meetings: Never    Marital Status: Married  Catering manager Violence: Not At Risk (04/17/2021)   Humiliation, Afraid, Rape, and Kick questionnaire    Fear of Current or Ex-Partner: No    Emotionally Abused: No    Physically Abused: No    Sexually Abused: No     BP 138/78   Pulse 73   Ht 5\' 4"  (1.626 m)   Wt 126 lb (57.2 kg)   SpO2 97%   BMI 21.63 kg/m   Physical Exam:  Well appearing 82 yo woman, looking younger than her stated age, NAD HEENT: Unremarkable Neck:  No JVD, no thyromegally Lymphatics:  No adenopathy Back:  No CVA tenderness Lungs:  Clear with no wheezes HEART:  Regular rate rhythm, no murmurs, no rubs, no clicks Abd:  soft, positive bowel sounds, no organomegally, no rebound, no guarding Ext:  2 plus pulses, no edema, no cyanosis, no clubbing Skin:  No rashes  no nodules Neuro:  CN II through XII intact, motor grossly intact  EKG - nsr   Assess/Plan: SVT - I have discussed the indictions/including the risks/benefits/goals/expectations of EP study and catheter ablation of SVT and she will call us if she would like to proceed.  Sharlot Gowda Salman Wellen,MD

## 2023-05-13 NOTE — Telephone Encounter (Signed)
Called pt and moved her SVT Ablation from 1/6 to 1/7 at 9:30 am with Dr. Ladona Ridgel.   I did not update her instruction letter. The only thing that changed is the date.

## 2023-05-13 NOTE — Patient Instructions (Signed)
Medication Instructions:  Your physician recommends that you continue on your current medications as directed. Please refer to the Current Medication list given to you today.  *If you need a refill on your cardiac medications before your next appointment, please call your pharmacy*  Lab Work: CBC and Bmet  If you have labs (blood work) drawn today and your tests are completely normal, you will receive your results only by: MyChart Message (if you have MyChart) OR A paper copy in the mail If you have any lab test that is abnormal or we need to change your treatment, we will call you to review the results.  Testing/Procedures: EP study and SVT ablation on 06/02/23  Follow-Up: At [facility], you and your health needs are our priority.  As part of our continuing mission to provide you with exceptional heart care, we have created designated Provider Care Teams.  These Care Teams include your primary Cardiologist (physician) and Advanced Practice Providers (APPs -  Physician Assistants and Nurse Practitioners) who all work together to provide you with the care you need, when you need it.    Your next appointment:   To be scheduled  The format for your next appointment:   In Person  Provider:   Lewayne Bunting, MD{or one of the following Advanced Practice Providers on your designated Care Team:   Francis Dowse, [place] Casimiro Needle "Mardelle Matte" [place], [place] Earnest Rosier, NP   Important Information About Sugar

## 2023-05-14 LAB — BASIC METABOLIC PANEL
BUN/Creatinine Ratio: 16 (ref 12–28)
BUN: 16 mg/dL (ref 8–27)
CO2: 27 mmol/L (ref 20–29)
Calcium: 9.6 mg/dL (ref 8.7–10.3)
Chloride: 104 mmol/L (ref 96–106)
Creatinine, Ser: 0.97 mg/dL (ref 0.57–1.00)
Glucose: 92 mg/dL (ref 70–99)
Potassium: 4.3 mmol/L (ref 3.5–5.2)
Sodium: 144 mmol/L (ref 134–144)
eGFR: 58 mL/min/{1.73_m2} — ABNORMAL LOW (ref >59)

## 2023-05-14 LAB — CBC
Hematocrit: 40.3 % (ref 34.0–46.6)
Hemoglobin: 13.4 g/dL (ref 11.1–15.9)
MCH: 30.5 pg (ref 26.6–33.0)
MCHC: 33.3 g/dL (ref 31.5–35.7)
MCV: 92 fL (ref 79–97)
Platelets: 263 10*3/uL (ref 150–450)
RBC: 4.39 x10E6/uL (ref 3.77–5.28)
RDW: 13.8 % (ref 11.7–15.4)
WBC: 6.5 10*3/uL (ref 3.4–10.8)

## 2023-05-15 ENCOUNTER — Ambulatory Visit: Payer: Medicare Other

## 2023-05-15 VITALS — Ht 64.0 in | Wt 126.0 lb

## 2023-05-15 DIAGNOSIS — Z0000 Encounter for general adult medical examination without abnormal findings: Secondary | ICD-10-CM | POA: Diagnosis not present

## 2023-05-15 NOTE — Progress Notes (Signed)
Subjective:   [name] is a 82 y.o. female who presents for Medicare Annual (Subsequent) preventive examination.  Visit Complete: Virtual I connected with  Burnis Kingfisher on 05/15/23 by a audio enabled telemedicine application and verified that I am speaking with the correct person using two identifiers.  Patient Location: Home  Provider Location: Home Office  I discussed the limitations of evaluation and management by telemedicine. The patient expressed understanding and agreed to proceed.  Vital Signs: Because this visit was a virtual/telehealth visit, some criteria may be missing or patient reported. Any vitals not documented were not able to be obtained and vitals that have been documented are patient reported.    Cardiac Risk Factors include: advanced age (>69men, >54 women)     Objective:    Today's Vitals   05/15/23 1356  Weight: 126 lb (57.2 kg)  Height: 5\' 4"  (1.626 m)   Body mass index is 21.63 kg/m.     05/15/2023    2:03 PM 04/17/2021    3:04 PM 10/15/2018    9:18 AM 03/20/2018    1:33 PM 09/12/2016   11:28 AM 08/03/2015   11:59 AM 08/01/2014   11:56 AM  Advanced Directives  Does Patient Have a Medical Advance Directive? Yes Yes Yes Yes No Yes Yes  Type of Estate agent of [place];Living will Healthcare Power of [place];Living will Healthcare Power of [place];Living will Healthcare Power of [place];Living will  Healthcare Power of [employer];Living will Healthcare Power of [place];Living will  Does patient want to make changes to medical advance directive?   No - Patient declined   No - Patient declined   Copy of Healthcare Power of Attorney in Chart? No - copy requested No - copy requested No - copy requested   No - copy requested No - copy requested  Would patient like information on creating a medical advance directive?     Yes (MAU/Ambulatory/Procedural Areas - Information given)      Current Medications (verified) Outpatient  Encounter Medications as of 05/15/2023  Medication Sig   amLODipine (NORVASC) 2.5 MG tablet Take 1 tablet by mouth once daily   Ascorbic Acid (VITAMIN C) 500 MG tablet Take 500 mg by mouth daily.   Azelaic Acid 15 % gel Apply 1 application topically at bedtime.   b complex vitamins tablet Take 1 tablet by mouth daily.   cetirizine (ZYRTEC) 10 MG tablet Take 1 tablet (10 mg total) by mouth daily.   Cholecalciferol (VITAMIN D3) 1000 UNITS CAPS Take 1 tablet by mouth daily.   colestipol (COLESTID) 1 g tablet Take 2 tablets (2 g total) by mouth at bedtime.   Cranberry 500 MG CAPS Take 1 capsule by mouth daily.   diphenoxylate-atropine (LOMOTIL) 2.5-0.025 MG tablet Take 1 tablet by mouth 4 (four) times daily as needed for diarrhea or loose stools.   DOTTI 0.025 MG/24HR APPLY 1  PATCH TOPICALLY TO SKIN TWICE A WEEK   escitalopram (LEXAPRO) 10 MG tablet TAKE 1 TABLET BY MOUTH DAILY   fluticasone (FLONASE) 50 MCG/ACT nasal spray USE 2 SPRAY(S) IN EACH NOSTRIL AS NEEDED FOR ALLERGIES OR  RHINITIS   JUBLIA 10 % SOLN SMARTSIG:Applicator Topical Every Night   nystatin cream (MYCOSTATIN) Apply 1 Application topically 2 (two) times daily.   Pancrelipase, Lip-Prot-Amyl, (CREON) 24000-76000 units CPEP TAKE 6 CAPSULES BY MOUTH WITH  MEALS AND 1 TO 3 CAPSULES WITH  EACH SNACK. IF EATING HIGH FAT  ITEMS TAKE 8 CAPSULES WITH MEALS MAX 30  CAPSULES DAILY   pantoprazole (PROTONIX) 40 MG tablet Take 1 tablet (40 mg total) by mouth daily. Office visit for further refills   Probiotic Product (PROBIOTIC PO) Take by mouth. Takes Probaclac probiotic once daily   tretinoin (RETIN-A) 0.025 % cream Apply 1 application topically 4 (four) times a week.   TURMERIC PO Take by mouth daily.   No facility-administered encounter medications on file as of 05/15/2023.    Allergies (verified) Amoxicillin, Azithromycin, Ciprofloxacin, Epinephrine, Penicillins, Sulfonamide derivatives, and Tamiflu [oseltamivir phosphate]    History: 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HCC)    Cataract    bilateral and removed   CHEST PAIN    LHC 02/2011: normal cors, EF 65%   Contact dermatitis 10/13/2017   Diverticulosis    Esophageal reflux    Fatty liver    Fibrocystic breast    Hand injury, left, initial encounter 10/13/2017   Hepatic cyst    Hepatic steatosis    History of shingles    HYPOTENSION, ORTHOSTATIC    IBS (irritable bowel syndrome)    Internal hemorrhoids without mention of complication    Kidney lesion    Kidney stone    Liver cyst    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   Osteopenia    Osteopenia    Osteoporosis    Pain in ear    PALPITATIONS, RECURRENT    Pancreatitis chronic    Preventative health care    PSVT    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   SHOULDER PAIN    Syncope and collapse    broke a rib, and a vertabre   TOBACCO USE, QUIT    VITAMIN D DEFICIENCY    Past Surgical History:  Procedure Laterality Date   ABDOMINAL HYSTERECTOMY  1998   BREAST SURGERY     breast bx on left, benign   CARDIAC CATHETERIZATION     CATARACT EXTRACTION, BILATERAL     CATARACT EXTRACTION, BILATERAL     CHOLECYSTECTOMY  1999   COLONOSCOPY     EYE SURGERY Bilateral 04/2017, 05/2017   cataracts   KNEE SURGERY  1958   POLYPECTOMY     TUBAL LIGATION     with D&C   Family History  Problem Relation Age of Onset   Hypertension Mother    Stroke Mother    Alcohol abuse Mother    Heart disease Father    Bladder Cancer Father    Diabetes Father    Hypothyroidism Father    Dementia Father    Uterine cancer Paternal Grandmother    Heart disease Sister        rheumatic fever at 6 months, cardiomegaly MI   Heart disease Maternal Grandmother    Stroke Maternal Grandmother    Heart disease Maternal Grandfather    Stroke Maternal Grandfather    Breast cancer Cousin     Pancreatic cancer Paternal Aunt    Stomach cancer Paternal Uncle    Breast cancer Paternal Aunt    Lung cancer Paternal Uncle    Crohn's disease Son    Colon cancer Neg Hx    Colon polyps Neg Hx    Esophageal cancer Neg Hx    Rectal cancer Neg Hx    Social History   Socioeconomic History   Marital status: Married    Spouse name: Not on file  Number of children: 1   Years of education: Not on file   Highest education level: Not on file  Occupational History   Occupation: Retired    Associate Professor: RETIRED  Tobacco Use   Smoking status: Former    Current packs/day: 0.00    Types: Cigarettes    Quit date: 05/27/1973    Years since quitting: 50.0   Smokeless tobacco: Never  Vaping Use   Vaping status: Never Used  Substance and Sexual Activity   Alcohol use: No   Drug use: No   Sexual activity: Not on file  Other Topics Concern   Not on file  Social History Narrative   Not on file   Social Drivers of Health   Financial Resource Strain: Low Risk  (05/15/2023)   Overall Financial Resource Strain (CARDIA)    Difficulty of Paying Living Expenses: Not hard at all  Food Insecurity: No Food Insecurity (05/15/2023)   Hunger Vital Sign    Worried About Running Out of Food in the Last Year: Never true    Ran Out of Food in the Last Year: Never true  Transportation Needs: No Transportation Needs (05/15/2023)   PRAPARE - Administrator, Civil Service (Medical): No    Lack of Transportation (Non-Medical): No  Physical Activity: Sufficiently Active (05/15/2023)   Exercise Vital Sign    Days of Exercise per Week: 4 days    Minutes of Exercise per Session: 60 min  Stress: No Stress Concern Present (05/15/2023)   [employer] of Occupational Health - Occupational Stress Questionnaire    Feeling of Stress : Not at all  Social Connections: Socially Integrated (05/15/2023)   Social Connection and Isolation Panel [NHANES]    Frequency of Communication with Friends and  Family: More than three times a week    Frequency of Social Gatherings with Friends and Family: More than three times a week    Attends Religious Services: More than 4 times per year    Active Member of [employer] or Organizations: Yes    Attends Engineer, structural: More than 4 times per year    Marital Status: Married    Tobacco Counseling Counseling given: Not Answered   Clinical Intake:  Pre-visit preparation completed: Yes  Pain : No/denies pain     BMI - recorded: 21.63 Nutritional Status: BMI of 19-24  Normal Nutritional Risks: None Diabetes: No  How often do you need to have someone help you when you read instructions, pamphlets, or other written materials from your doctor or pharmacy?: 1 - Never  Interpreter Needed?: No  Information entered by :: Theresa Mulligan LPN   Activities of Daily Living    05/15/2023    2:02 PM  In your present state of health, do you have any difficulty performing the following activities:  Hearing? 0  Vision? 0  Difficulty concentrating or making decisions? 0  Walking or climbing stairs? 0  Dressing or bathing? 0  Doing errands, shopping? 0  Preparing Food and eating ? N  Using the Toilet? N  In the past six months, have you accidently leaked urine? N  Do you have problems with loss of bowel control? N  Managing your Medications? N  Managing your Finances? N  Housekeeping or managing your Housekeeping? N    Patient Care Team: Bradd Canary, MD as PCP - General (Family Medicine) Duke Salvia, MD as PCP - Electrophysiology (Cardiology) Duke Salvia, MD as Consulting Physician (Cardiology) Pyrtle, Carie Caddy,  MD as Consulting Physician (Gastroenterology) Venancio Poisson, MD as Consulting Physician (Dermatology) Ovidio Kin, MD as Consulting Physician (General Surgery) Suzanna Obey, MD as Consulting Physician (Otolaryngology) Maris Berger, MD as Consulting Physician (Ophthalmology)  Indicate any recent Medical  Services you may have received from other than [facility] providers in the past year (date may be approximate).     Assessment:   This is a routine wellness examination for K[place].  Hearing/Vision screen Hearing Screening - Comments:: Denies hearing difficulties   Vision Screening - Comments:: Wears rx glasses - up to date with routine eye exams with  [facility]   Goals Addressed               This Visit's Progress     Stay Active (pt-stated)         Depression Screen    05/15/2023    2:01 PM 09/23/2022    2:38 PM 04/30/2022    2:32 PM 04/17/2021    3:11 PM 11/30/2019    2:19 PM 10/15/2018    9:18 AM 09/12/2016   11:25 AM  PHQ 2/9 Scores  PHQ - 2 Score 0 0 0 0 0 0 0  PHQ- 9 Score  0   0      Fall Risk    05/15/2023    2:02 PM 09/23/2022    2:37 PM 04/17/2021    3:07 PM 11/30/2019    1:50 PM 10/15/2018    9:18 AM  Fall Risk   Falls in the past year? 0 0 0 0 0  Number falls in past yr: 0 0 0    Injury with Fall? 0 0 0    Risk for fall due to : No Fall Risks      Follow up Falls prevention discussed Falls evaluation completed Falls prevention discussed      MEDICARE RISK AT HOME: Medicare Risk at Home Any stairs in or around the home?: Yes If so, are there any without handrails?: No Home free of loose throw rugs in walkways, pet beds, electrical cords, etc?: Yes Adequate lighting in your home to reduce risk of falls?: Yes Life alert?: No Use of a cane, walker or w/c?: No Grab bars in the bathroom?: No Shower chair or bench in shower?: No Elevated toilet seat or a handicapped toilet?: No  TIMED UP AND GO:  Was the test performed?  No    Cognitive Function:    09/12/2016   12:28 PM  MMSE - Mini Mental State Exam  Orientation to time 5  Orientation to Place 5  Registration 3  Attention/ Calculation 5  Recall 3  Language- name 2 objects 2  Language- repeat 1  Language- follow 3 step command 3  Language- read & follow direction 1  Write a sentence 1  Copy  design 1  Total score 30        05/15/2023    2:03 PM  6CIT Screen  What Year? 0 points  What month? 0 points  What time? 0 points  Count back from 20 0 points  Months in reverse 0 points  Repeat phrase 0 points  Total Score 0 points    Immunizations Immunization History  Administered Date(s) Administered   Influenza Split 04/02/2011, 04/01/2012   Influenza Whole 06/28/2005, 03/25/2008, 03/29/2009, 03/07/2010   Influenza, High Dose Seasonal PF 04/02/2013, 04/14/2014, 03/31/2015, 04/03/2017, 03/30/2018, 04/09/2019   Influenza,inj,Quad PF,6+ Mos 03/19/2016   PFIZER(Purple Top)SARS-COV-2 Vaccination 07/11/2019, 08/03/2019   Pneumococcal Conjugate-13 09/24/2013   Pneumococcal  Polysaccharide-23 05/01/2007   Td 10/13/2017   Tdap 05/27/2006   Zoster Recombinant(Shingrix) 01/08/2022, 02/24/2022   Zoster, Live 09/01/2007    TDAP status: Up to date  Flu Vaccine status: Declined, Education has been provided regarding the importance of this vaccine but patient still declined. Advised may receive this vaccine at local pharmacy or Health Dept. Aware to provide a copy of the vaccination record if obtained from local pharmacy or Health Dept. Verbalized acceptance and understanding.  Pneumococcal vaccine status: Up to date  Covid-19 vaccine status: Declined, Education has been provided regarding the importance of this vaccine but patient still declined. Advised may receive this vaccine at local pharmacy or Health Dept.or vaccine clinic. Aware to provide a copy of the vaccination record if obtained from local pharmacy or Health Dept. Verbalized acceptance and understanding.  Qualifies for Shingles Vaccine? Yes   Zostavax completed Yes   Shingrix Completed?: Yes  Screening Tests Health Maintenance  Topic Date Due   COVID-19 Vaccine (3 - Pfizer risk series) 08/31/2019   INFLUENZA VACCINE  12/26/2022   MAMMOGRAM  06/20/2023   Medicare Annual Wellness (AWV)  05/14/2024   Colonoscopy   06/14/2024   DTaP/Tdap/Td (3 - Td or Tdap) 10/14/2027   Pneumonia Vaccine 90+ Years old  Completed   DEXA SCAN  Completed   Zoster Vaccines- Shingrix  Completed   HPV VACCINES  Aged Out    Health Maintenance  Health Maintenance Due  Topic Date Due   COVID-19 Vaccine (3 - Pfizer risk series) 08/31/2019   INFLUENZA VACCINE  12/26/2022    Colorectal cancer screening: Type of screening: Colonoscopy. Completed 06/14/21. Repeat every 3 years  Mammogram status: Completed 06/09/22. Repeat every year  Bone Density status: Completed 06/06/21. Results reflect: Bone density results: OSTEOPOROSIS. Repeat every   years.    Additional Screening:    Vision Screening: Recommended annual ophthalmology exams for early detection of glaucoma and other disorders of the eye. Is the patient up to date with their annual eye exam?  Yes  Who is the provider or what is the name of the office in which the patient attends annual eye exams? [facility] If pt is not established with a provider, would they like to be referred to a provider to establish care? No .   Dental Screening: Recommended annual dental exams for proper oral hygiene    Community Resource Referral / Chronic Care Management:  CRR required this visit?  No   CCM required this visit?  No     Plan:     I have personally reviewed and noted the following in the patient's chart:   Medical and social history Use of alcohol, tobacco or illicit drugs  Current medications and supplements including opioid prescriptions. Patient is not currently taking opioid prescriptions. Functional ability and status Nutritional status Physical activity Advanced directives List of other physicians Hospitalizations, surgeries, and ER visits in previous 12 months Vitals Screenings to include cognitive, depression, and falls Referrals and appointments  In addition, I have reviewed and discussed with patient certain preventive protocols, quality  metrics, and best practice recommendations. A written personalized care plan for preventive services as well as general preventive health recommendations were provided to patient.     Tillie Rung, LPN   09/81/1914   After Visit Summary: (MyChart) Due to this being a telephonic visit, the after visit summary with patients personalized plan was offered to patient via MyChart   Nurse Notes: None

## 2023-05-15 NOTE — Patient Instructions (Addendum)
Ms. Skehan , Thank you for taking time to come for your Medicare Wellness Visit. I appreciate your ongoing commitment to your health goals. Please review the following plan we discussed and let me know if I can assist you in the future.   Referrals/Orders/Follow-Ups/Clinician Recommendations:   This is a list of the screening recommended for you and due dates:  Health Maintenance  Topic Date Due   COVID-19 Vaccine (3 - Pfizer risk series) 08/31/2019   Flu Shot  12/26/2022   Mammogram  06/20/2023   Medicare Annual Wellness Visit  05/14/2024   Colon Cancer Screening  06/14/2024   DTaP/Tdap/Td vaccine (3 - Td or Tdap) 10/14/2027   Pneumonia Vaccine  Completed   DEXA scan (bone density measurement)  Completed   Zoster (Shingles) Vaccine  Completed   HPV Vaccine  Aged Out    Advanced directives: (Copy Requested) Please bring a copy of your health care power of attorney and living will to the office to be added to your chart at your convenience.  Next Medicare Annual Wellness Visit scheduled for next year: Yes

## 2023-05-19 ENCOUNTER — Ambulatory Visit (INDEPENDENT_AMBULATORY_CARE_PROVIDER_SITE_OTHER): Payer: Medicare Other

## 2023-05-19 DIAGNOSIS — R55 Syncope and collapse: Secondary | ICD-10-CM | POA: Diagnosis not present

## 2023-05-19 LAB — CUP PACEART REMOTE DEVICE CHECK
Date Time Interrogation Session: 20241222231823
Implantable Pulse Generator Implant Date: 20231208

## 2023-06-03 ENCOUNTER — Encounter (HOSPITAL_COMMUNITY): Payer: Self-pay | Admitting: Certified Registered Nurse Anesthetist

## 2023-06-03 ENCOUNTER — Encounter (HOSPITAL_COMMUNITY)
Admission: RE | Disposition: A | Discharge status: Home / Self Care | Payer: Self-pay | Source: Home / Self Care | Attending: Internal Medicine

## 2023-06-03 ENCOUNTER — Other Ambulatory Visit: Payer: Self-pay

## 2023-06-03 ENCOUNTER — Ambulatory Visit (HOSPITAL_COMMUNITY)
Admission: RE | Admit: 2023-06-03 | Discharge: 2023-06-03 | Disposition: A | Discharge status: Home / Self Care | Payer: Medicare Other | Attending: Internal Medicine | Admitting: Internal Medicine

## 2023-06-03 DIAGNOSIS — I441 Atrioventricular block, second degree: Secondary | ICD-10-CM | POA: Diagnosis not present

## 2023-06-03 DIAGNOSIS — I4719 Other supraventricular tachycardia: Secondary | ICD-10-CM | POA: Diagnosis present

## 2023-06-03 DIAGNOSIS — I471 Supraventricular tachycardia, unspecified: Secondary | ICD-10-CM

## 2023-06-03 HISTORY — PX: SVT ABLATION: EP1225

## 2023-06-03 SURGERY — SVT ABLATION

## 2023-06-03 MED ORDER — SODIUM CHLORIDE 0.9 % IV SOLN: 250.0000 mL | INTRAVENOUS | Status: DC | PRN

## 2023-06-03 MED ORDER — ONDANSETRON HCL 4 MG/2ML IJ SOLN: 4.0000 mg | Freq: Four times a day (QID) | INTRAMUSCULAR | Status: DC | PRN

## 2023-06-03 MED ORDER — FENTANYL CITRATE (PF) 100 MCG/2ML IJ SOLN
INTRAMUSCULAR | Status: DC | PRN
  Administered 2023-06-03 (×6): 12.5 ug via INTRAVENOUS

## 2023-06-03 MED ORDER — SODIUM CHLORIDE 0.9 % IV SOLN
INTRAVENOUS | Status: DC
Start: 2023-06-03 — End: 2023-06-03

## 2023-06-03 MED ORDER — ISOPROTERENOL HCL 0.2 MG/ML IJ SOLN
INTRAMUSCULAR | Status: AC
  Filled 2023-06-03: qty 5

## 2023-06-03 MED ORDER — ACETAMINOPHEN 325 MG PO TABS: 650.0000 mg | ORAL_TABLET | ORAL | Status: DC | PRN

## 2023-06-03 MED ORDER — MIDAZOLAM HCL 5 MG/5ML IJ SOLN
INTRAMUSCULAR | Status: DC | PRN
  Administered 2023-06-03 (×6): 1 mg via INTRAVENOUS

## 2023-06-03 MED ORDER — FENTANYL CITRATE (PF) 100 MCG/2ML IJ SOLN
INTRAMUSCULAR | Status: AC
  Filled 2023-06-03: qty 2

## 2023-06-03 MED ORDER — SODIUM CHLORIDE 0.9% FLUSH: 3.0000 mL | INTRAVENOUS | Status: DC | PRN

## 2023-06-03 MED ORDER — MIDAZOLAM HCL 5 MG/5ML IJ SOLN
INTRAMUSCULAR | Status: AC
  Filled 2023-06-03: qty 5

## 2023-06-03 MED ORDER — BUPIVACAINE HCL (PF) 0.25 % IJ SOLN
INTRAMUSCULAR | Status: DC | PRN
  Administered 2023-06-03: 50 mL

## 2023-06-03 MED ORDER — SODIUM CHLORIDE 0.9% FLUSH: 3.0000 mL | Freq: Two times a day (BID) | INTRAVENOUS | Status: DC

## 2023-06-03 MED ORDER — BUPIVACAINE HCL (PF) 0.25 % IJ SOLN
INTRAMUSCULAR | Status: AC
  Filled 2023-06-03: qty 60

## 2023-06-03 MED ORDER — HEPARIN (PORCINE) IN NACL 1000-0.9 UT/500ML-% IV SOLN
INTRAVENOUS | Status: DC | PRN
  Administered 2023-06-03: 500 mL

## 2023-06-03 SURGICAL SUPPLY — 8 items
CATH EZ STEER NAV 4MM D-F CUR (ABLATOR) IMPLANT
CATH JOSEPH QUAD ALLRED 6F REP (CATHETERS) IMPLANT
CATH POLARIS X REPROCESSED (CATHETERS) IMPLANT
PACK EP LF (CUSTOM PROCEDURE TRAY) ×2 IMPLANT
PAD DEFIB RADIO PHYSIO CONN (PAD) ×2 IMPLANT
PATCH CARTO3 (PAD) IMPLANT
SHEATH PINNACLE 6F 10CM (SHEATH) IMPLANT
SHEATH PINNACLE 8F 10CM (SHEATH) IMPLANT

## 2023-06-03 NOTE — Interval H&P Note (Signed)
 History and Physical Interval Note:  06/03/2023 7:47 AM  [name]  has presented today for surgery, with the diagnosis of svt.  The various methods of treatment have been discussed with the patient and family. After consideration of risks, benefits and other options for treatment, the patient has consented to  Procedure(s): SVT ABLATION (N/A) as a surgical intervention.  The patient's history has been reviewed, patient examined, no change in status, stable for surgery.  I have reviewed the patient's chart and labs.  Questions were answered to the patient's satisfaction.     [name]

## 2023-06-03 NOTE — Progress Notes (Signed)
 Right femoral venous sheaths removed X 3.  Manual pressure held X 10 minutes.  Dressing applied.  Site soft no S/S of hematoma.  Post instructions given to pt, verbalizes understanding.

## 2023-06-03 NOTE — Progress Notes (Signed)
 Report given to [facility]

## 2023-06-04 ENCOUNTER — Encounter (HOSPITAL_COMMUNITY): Payer: Self-pay | Admitting: Internal Medicine

## 2023-06-06 ENCOUNTER — Other Ambulatory Visit: Payer: Self-pay | Admitting: Family Medicine

## 2023-06-06 DIAGNOSIS — F419 Anxiety disorder, unspecified: Secondary | ICD-10-CM

## 2023-06-10 ENCOUNTER — Telehealth: Payer: Self-pay | Admitting: Internal Medicine

## 2023-06-10 NOTE — Telephone Encounter (Signed)
  Patient c/o Palpitations:  STAT if patient reporting lightheadedness, shortness of breath, or chest pain  How long have you had palpitations/irregular HR/ Afib? Are you having the symptoms now?   No  Are you currently experiencing lightheadedness, SOB or CP?   Do you have a history of afib (atrial fibrillation) or irregular heart rhythm?   Yes  Have you checked your BP or HR? (document readings if available): No  Are you experiencing any other symptoms?    Patient stated she has been having heart fluttering which has gone away.  Patient stated she had an ablation last Tuesday, 1/7 and wants to know if she could start back going to the gym tomorrow (1/15).

## 2023-06-10 NOTE — Telephone Encounter (Signed)
 Spoke with Pt. Pt has had some light fluttering the past few days and wanted to make sure it was ok to go back to the gym. Pt stated the exercise she was wanting to do was seated with arms and legs, like a seated elliptical.  Told pt I thought it would be fine to do that exercise and to start slow, stay hydrated and to stop exercise if she started to have any symptoms. Pt stated understanding.

## 2023-06-12 ENCOUNTER — Ambulatory Visit (INDEPENDENT_AMBULATORY_CARE_PROVIDER_SITE_OTHER): Payer: Medicare Other | Admitting: Family Medicine

## 2023-06-12 ENCOUNTER — Other Ambulatory Visit: Payer: Self-pay | Admitting: Internal Medicine

## 2023-06-12 ENCOUNTER — Encounter: Payer: Self-pay | Admitting: Family Medicine

## 2023-06-12 VITALS — BP 111/68 | HR 86 | Temp 97.6°F | Ht 64.0 in | Wt 124.0 lb

## 2023-06-12 DIAGNOSIS — R8290 Unspecified abnormal findings in urine: Secondary | ICD-10-CM

## 2023-06-12 DIAGNOSIS — R21 Rash and other nonspecific skin eruption: Secondary | ICD-10-CM

## 2023-06-12 LAB — POC URINALSYSI DIPSTICK (AUTOMATED)
Bilirubin, UA: NEGATIVE
Blood, UA: NEGATIVE
Glucose, UA: NEGATIVE
Nitrite, UA: NEGATIVE
Protein, UA: POSITIVE — AB
Spec Grav, UA: 1.015 (ref 1.010–1.025)
Urobilinogen, UA: 0.2 U/dL
pH, UA: 5 (ref 5.0–8.0)

## 2023-06-12 MED ORDER — CEFDINIR 300 MG PO CAPS: 300.0000 mg | ORAL_CAPSULE | Freq: Two times a day (BID) | ORAL | 0 refills | Status: AC

## 2023-06-12 NOTE — Progress Notes (Signed)
Acute Office Visit  Subjective:     Patient ID: [name], female    DOB: [birth year], 83 y.o.   MRN: [id number]  Chief Complaint  Patient presents with   Rash     Patient is in today for rash, cloudy urine.   Discussed the use of AI scribe software for clinical note transcription with the patient, who gave verbal consent to proceed.  History of Present Illness   The patient presents with concerns of cloudy urine and a persistent rash. The patient noticed the cloudiness in their urine following a recent heart ablation procedure, during which they were bedridden for several hours. The patient denies dysuria and hematuria, but reports occasional discomfort post-urination.  The patient also reports a pruritic rash on both arms and legs, which has been present for a couple of months. The rash, characterized by patches of redness, has not responded significantly to various moisturizing lotions. The patient has been taking a 5mg  chewable melatonin supplement for sleep, which they suspect may be contributing to the rash due to potential allergies to the supplement's ingredients.               All review of systems negative except what is listed in the HPI      Objective:    BP 111/68   Pulse 86   Temp 97.6 F (36.4 C) (Oral)   Ht 5\' 4"  (1.626 m)   Wt 124 lb (56.2 kg)   SpO2 98%   BMI 21.28 kg/m    Physical Exam Vitals reviewed.  Constitutional:      Appearance: Normal appearance.  Cardiovascular:     Rate and Rhythm: Normal rate and regular rhythm.  Pulmonary:     Effort: Pulmonary effort is normal. No respiratory distress.     Breath sounds: Normal breath sounds. No wheezing.  Musculoskeletal:     Right lower leg: No edema.     Left lower leg: No edema.  Skin:    General: Skin is warm and dry.     Comments: Some scattered erythematous papules to arms and legs, evidence she has been scratching; no surrounding erythema, edema, induration, warmth   Neurological:     Mental Status: She is alert and oriented to person, place, and time.  Psychiatric:        Mood and Affect: Mood normal.        Behavior: Behavior normal.        Thought Content: Thought content normal.        Judgment: Judgment normal.        Results for orders placed or performed in visit on 06/12/23  POCT Urinalysis Dipstick (Automated)  Result Value Ref Range   Color, UA yellow    Clarity, UA clear    Glucose, UA Negative Negative   Bilirubin, UA negative    Ketones, UA trace    Spec Grav, UA 1.015 1.010 - 1.025   Blood, UA negative    pH, UA 5.0 5.0 - 8.0   Protein, UA Positive (A) Negative   Urobilinogen, UA 0.2 0.2 or 1.0 E.U./dL   Nitrite, UA negative    Leukocytes, UA Moderate (2+) (A) Negative        Assessment & Plan:   Problem List Items Addressed This Visit   None Visit Diagnoses       Cloudy urine    -  Primary Cloudy Urine. No dysuria or hematuria. Some discomfort after urination. -UA with leuks, protein, and ketones.  Of notes she did ride 12 miles on stationary bike the past 2 days in a row. -Will treat empirically with cefdinir and adjust pending culture results. -Encouraged adequate hydration -Recommend rechecking urine protein after completing antibiotics, when well hydrated.    Relevant Medications   cefdinir (OMNICEF) 300 MG capsule   Other Relevant Orders   POCT Urinalysis Dipstick (Automated) (Completed)   Urine Culture     Rash     Pruritic rash on both arms and legs for a couple of months. No other areas involved. No recent changes in diet or medications except for the addition of melatonin. No improvement with Vaseline lotion or CeraVe moisturizing cream. Some improvement noted with Aveeno Fragrance Free. -Stop melatonin for a week to assess if rash improves. -Add Pepcid (famotidine) twice a day for two weeks. -Continue Zyrtec daily. -Continue Aveeno Fragrance Free. -Return for follow-up if rash does not improve.               Meds ordered this encounter  Medications   cefdinir (OMNICEF) 300 MG capsule    Sig: Take 1 capsule (300 mg total) by mouth 2 (two) times daily for 5 days.    Dispense:  10 capsule    Refill:  0    Supervising Provider:   Danise Edge A [[id number]]    Return if symptoms worsen or fail to improve.  Clayborne Dana, NP

## 2023-06-13 ENCOUNTER — Ambulatory Visit (INDEPENDENT_AMBULATORY_CARE_PROVIDER_SITE_OTHER): Payer: Medicare Other | Admitting: Physician Assistant

## 2023-06-13 ENCOUNTER — Encounter: Payer: Self-pay | Admitting: Physician Assistant

## 2023-06-13 VITALS — BP 140/82 | HR 78 | Ht 64.0 in | Wt 124.0 lb

## 2023-06-13 DIAGNOSIS — K219 Gastro-esophageal reflux disease without esophagitis: Secondary | ICD-10-CM

## 2023-06-13 DIAGNOSIS — K580 Irritable bowel syndrome with diarrhea: Secondary | ICD-10-CM

## 2023-06-13 DIAGNOSIS — K8681 Exocrine pancreatic insufficiency: Secondary | ICD-10-CM

## 2023-06-13 DIAGNOSIS — K861 Other chronic pancreatitis: Secondary | ICD-10-CM | POA: Diagnosis not present

## 2023-06-13 DIAGNOSIS — K760 Fatty (change of) liver, not elsewhere classified: Secondary | ICD-10-CM | POA: Diagnosis not present

## 2023-06-13 DIAGNOSIS — Z860101 Personal history of adenomatous and serrated colon polyps: Secondary | ICD-10-CM

## 2023-06-13 DIAGNOSIS — D122 Benign neoplasm of ascending colon: Secondary | ICD-10-CM

## 2023-06-13 DIAGNOSIS — R1013 Epigastric pain: Secondary | ICD-10-CM

## 2023-06-13 MED ORDER — DIPHENOXYLATE-ATROPINE 2.5-0.025 MG PO TABS: 1.0000 | ORAL_TABLET | Freq: Four times a day (QID) | ORAL | 1 refills | Status: DC | PRN

## 2023-06-13 MED ORDER — PANTOPRAZOLE SODIUM 40 MG PO TBEC: 40.0000 mg | DELAYED_RELEASE_TABLET | Freq: Every day | ORAL | 3 refills | Status: DC

## 2023-06-13 MED ORDER — CREON 24000-76000 UNITS PO CPEP: ORAL_CAPSULE | ORAL | 3 refills | Status: DC

## 2023-06-13 MED ORDER — COLESTIPOL HCL 1 G PO TABS: 2.0000 g | ORAL_TABLET | Freq: Every day | ORAL | 3 refills | Status: DC

## 2023-06-13 MED ORDER — VITAMIN E 180 MG (400 UNIT) PO CAPS: 400.0000 [IU] | ORAL_CAPSULE | Freq: Every morning | ORAL | 3 refills | Status: AC

## 2023-06-13 NOTE — Patient Instructions (Signed)
Refill sent to pharmacy.   Follow up in 1 year.

## 2023-06-13 NOTE — Progress Notes (Signed)
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    06/04/2022 patient seen in clinic by Dr. Rhea Belton.  At that time was doing fairly well but had an episode of syncope while driving and having episodes of what sounded like SVT.  She had not been driving since that time.  From a GI perspective things are stable using Creon at previous dose with every meal.  Still some inconsistency with loose stools at times.  Use Colestipol 1 g at bedtime.  Had tried 2 g but felt constipated.  Also used Lomotil 1-2 tabs as needed but not daily.  At that time continued Creon 6 capsules with meals 1-3 with snacks.  Also a multivitamin.  Continued Colestipol 1 to 2 g nightly.  Told to minimize Lomotil but have it available if needed.  Discussed history of fatty liver and recommended continued vitamin EE 800 IU daily.  Also continue Pantoprazole 40 mg for GERD.  Discussed surveillance colonoscopy the year before with no repeat recommended due to age.    Today, patient presents to clinic and tells me that about a week ago she had a heart ablation for persistent SVT with heart rates up above 200, she has been doing well since then.      Tells me that she manages her chronic GI symptoms fairly well with her medications including Lomotil which she uses as needed for breakthrough diarrhea, Creon 6 with a meal and 1-3 with a snack as well as Pantoprazole 40 mg daily and vitamin E for her known fatty liver.  She really has had no change to her chronic GI symptoms.  Still suffers from some days of diarrhea, but Lomotil helps with that.  She  is fairly happy.    Does discuss that she would like to at least talk about having another colonoscopy possibly in a year pending on how things are going.  Tells me she is nervous because she has friends who have had colon cancer and she has always had polyps.    Tells me she still works out at [employer] and then will meet a group of friends at [employer] afterwards.    Denies fever, chills, weight loss, blood in her stool, nausea or vomiting.    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HCC)    Cataract    bilateral and removed   CHEST PAIN    LHC 02/2011: normal cors, EF 65%   Contact dermatitis 10/13/2017   Diverticulosis    Esophageal reflux    Fatty liver    Fibrocystic breast    Hand injury, left, initial encounter 10/13/2017   Hepatic cyst    Hepatic steatosis    History of shingles    HYPOTENSION, ORTHOSTATIC    IBS (irritable bowel syndrome)    Internal hemorrhoids without mention of complication    Kidney lesion    Kidney stone    Liver cyst    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   Osteopenia    Osteopenia    Osteoporosis  Pain in ear    PALPITATIONS, RECURRENT    Pancreatitis chronic    Preventative health care    PSVT    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   SHOULDER PAIN    Syncope and collapse    broke a rib, and a vertabre   TOBACCO USE, QUIT    VITAMIN D DEFICIENCY     Past Surgical History:  Procedure Laterality Date   ABDOMINAL HYSTERECTOMY  1998   BREAST SURGERY     breast bx on left, benign   CARDIAC CATHETERIZATION     CATARACT EXTRACTION, BILATERAL     CATARACT EXTRACTION, BILATERAL     CHOLECYSTECTOMY  1999   COLONOSCOPY     EYE SURGERY Bilateral 04/2017, 05/2017   cataracts   KNEE SURGERY  1958   POLYPECTOMY     SVT ABLATION N/A 06/03/2023   Procedure: SVT ABLATION;  Surgeon: Marinus Maw, MD;  Location: [facility];   Service: Cardiovascular;  Laterality: N/A;   TUBAL LIGATION     with D&C    Current Outpatient Medications  Medication Sig Dispense Refill   amLODipine (NORVASC) 2.5 MG tablet Take 1 tablet by mouth once daily 90 tablet 2   Ascorbic Acid (VITAMIN C) 500 MG tablet Take 500 mg by mouth in the morning.     Azelaic Acid 15 % gel Apply 1 application topically at bedtime.     B Complex-C (B-COMPLEX WITH VITAMIN C) tablet Take 1 tablet by mouth in the morning.     Calcium Citrate-Vitamin D (CALCIUM CITRATE + D3 PO) Take 1 tablet by mouth every other day. Alternating days with the multivitamin.     cefdinir (OMNICEF) 300 MG capsule Take 1 capsule (300 mg total) by mouth 2 (two) times daily for 5 days. 10 capsule 0   cetirizine (ZYRTEC) 10 MG tablet Take 1 tablet (10 mg total) by mouth daily. (Patient taking differently: Take 10 mg by mouth at bedtime.) 90 tablet 3   cholecalciferol (VITAMIN D3) 25 MCG (1000 UNIT) tablet Take 1,000 Units by mouth in the morning.     colestipol (COLESTID) 1 g tablet Take 2 tablets (2 g total) by mouth at bedtime. (Patient taking differently: Take 1 g by mouth at bedtime.) 180 tablet 3   CRANBERRY PO Take 1 capsule by mouth in the morning.     diphenoxylate-atropine (LOMOTIL) 2.5-0.025 MG tablet Take 1 tablet by mouth 4 (four) times daily as needed for diarrhea or loose stools. (Patient taking differently: Take 1-2 tablets by mouth 4 (four) times daily as needed for diarrhea or loose stools.) 360 tablet 1   DOTTI 0.025 MG/24HR APPLY 1  PATCH TOPICALLY TO SKIN TWICE A WEEK (Patient taking differently: Place 1 patch onto the skin See admin instructions. Applies new patch once to twice weekly) 24 patch 0   escitalopram (LEXAPRO) 10 MG tablet Take 1 tablet (10 mg total) by mouth daily. 90 tablet 0   fluticasone (FLONASE) 50 MCG/ACT nasal spray USE 2 SPRAY(S) IN EACH NOSTRIL AS NEEDED FOR ALLERGIES OR  RHINITIS (Patient taking differently: Place 2 sprays into both nostrils at  bedtime.) 48 g 1   Melatonin 5 MG CHEW Chew 5 mg by mouth at bedtime.     Multiple Vitamin (MULTIVITAMIN WITH MINERALS) TABS tablet Take 1 tablet by mouth every other day. Alternating days with the calcium supplement     Pancrelipase, Lip-Prot-Amyl, (CREON) 24000-76000 units CPEP TAKE 6 CAPSULES BY MOUTH WITH  MEALS AND  1 TO 3 CAPSULES WITH  EACH SNACK. IF EATING HIGH FAT  ITEMS TAKE 8 CAPSULES WITH MEALS . MAX 30 CAPSULES DAILY 2430 capsule 0   pantoprazole (PROTONIX) 40 MG tablet Take 1 tablet (40 mg total) by mouth daily. Office visit for further refills 90 tablet 0   Probiotic Product (PROBIOTIC PO) Take 1 capsule by mouth in the morning. Probiotic Gummy     tolnaftate (TINACTIN) 1 % external solution Apply 1 Application topically at bedtime.     vitamin E 180 MG (400 UNITS) capsule Take 400 Units by mouth in the morning.     No current facility-administered medications for this visit.    Allergies as of 06/13/2023 - Review Complete 06/13/2023  Allergen Reaction Noted   Amoxicillin  01/06/2007   Azithromycin  10/03/2009   Ciprofloxacin  05/01/2007   Epinephrine  01/06/2007   Penicillins  10/03/2009   Sulfonamide derivatives  02/27/2009   Tamiflu [oseltamivir phosphate] Hives 09/12/2016    Family History  Problem Relation Age of Onset   Hypertension Mother    Stroke Mother    Alcohol abuse Mother    Heart disease Father    Bladder Cancer Father    Diabetes Father    Hypothyroidism Father    Dementia Father    Uterine cancer Paternal Grandmother    Heart disease Sister        rheumatic fever at 6 months, cardiomegaly MI   Heart disease Maternal Grandmother    Stroke Maternal Grandmother    Heart disease Maternal Grandfather    Stroke Maternal Grandfather    Breast cancer Cousin    Pancreatic cancer Paternal Aunt    Stomach cancer Paternal Uncle    Breast cancer Paternal Aunt    Lung cancer Paternal Uncle    Crohn's disease Son    Colon cancer Neg Hx    Colon polyps  Neg Hx    Esophageal cancer Neg Hx    Rectal cancer Neg Hx     Social History   Socioeconomic History   Marital status: Married    Spouse name: Not on file   Number of children: 1   Years of education: Not on file   Highest education level: Bachelor's degree (e.g., BA, AB, BS)  Occupational History   Occupation: Retired    Associate Professor: RETIRED  Tobacco Use   Smoking status: Former    Current packs/day: 0.00    Types: Cigarettes    Quit date: 05/27/1973    Years since quitting: 50.0   Smokeless tobacco: Never  Vaping Use   Vaping status: Never Used  Substance and Sexual Activity   Alcohol use: No   Drug use: No   Sexual activity: Not on file  Other Topics Concern   Not on file  Social History Narrative   Not on file   Social Drivers of Health   Financial Resource Strain: Low Risk  (06/11/2023)   Overall Financial Resource Strain (CARDIA)    Difficulty of Paying Living Expenses: Not hard at all  Food Insecurity: No Food Insecurity (06/11/2023)   Hunger Vital Sign    Worried About Running Out of Food in the Last Year: Never true    Ran Out of Food in the Last Year: Never true  Transportation Needs: No Transportation Needs (06/11/2023)   PRAPARE - Administrator, Civil Service (Medical): No    Lack of Transportation (Non-Medical): No  Physical Activity: Sufficiently Active (06/11/2023)   Exercise Vital Sign  Days of Exercise per Week: 4 days    Minutes of Exercise per Session: 80 min  Stress: No Stress Concern Present (06/11/2023)   [employer] of Occupational Health - Occupational Stress Questionnaire    Feeling of Stress : Only a little  Social Connections: Socially Integrated (06/11/2023)   Social Connection and Isolation Panel [NHANES]    Frequency of Communication with Friends and Family: More than three times a week    Frequency of Social Gatherings with Friends and Family: More than three times a week    Attends Religious Services: More than 4  times per year    Active Member of [employer] or Organizations: Yes    Attends Engineer, structural: More than 4 times per year    Marital Status: Married  Catering manager Violence: Not At Risk (05/15/2023)   Humiliation, Afraid, Rape, and Kick questionnaire    Fear of Current or Ex-Partner: No    Emotionally Abused: No    Physically Abused: No    Sexually Abused: No    Review of Systems:    Constitutional: No weight loss, fever or chills Cardiovascular: No chest pain Respiratory: No SOB  Gastrointestinal: See HPI and otherwise negative   Physical Exam:  Vital signs: BP (!) 140/82   Pulse 78   Ht 5\' 4"  (1.626 m)   Wt 124 lb (56.2 kg)   BMI 21.28 kg/m    Constitutional:   Pleasant elderly Caucasian female appears to be in NAD, Well developed, Well nourished, alert and cooperative Respiratory: Respirations even and unlabored. Lungs clear to auscultation bilaterally.   No wheezes, crackles, or rhonchi.  Cardiovascular: Normal S1, S2. No MRG. Regular rate and rhythm. No peripheral edema, cyanosis or pallor.  Gastrointestinal:  Soft, nondistended, nontender. No rebound or guarding. Normal bowel sounds. No appreciable masses or hepatomegaly. Rectal:  Not performed.  Psychiatric:  Demonstrates good judgement and reason without abnormal affect or behaviors.  RELEVANT LABS AND IMAGING: CBC    Component Value Date/Time   WBC 6.5 05/13/2023 1052   WBC 7.5 09/23/2022 1521   RBC 4.39 05/13/2023 1052   RBC 4.36 09/23/2022 1521   HGB 13.4 05/13/2023 1052   HCT 40.3 05/13/2023 1052   PLT 263 05/13/2023 1052   MCV 92 05/13/2023 1052   MCH 30.5 05/13/2023 1052   MCH 30.2 03/20/2018 1403   MCHC 33.3 05/13/2023 1052   MCHC 33.5 09/23/2022 1521   RDW 13.8 05/13/2023 1052   LYMPHSABS 1.5 09/23/2022 1521   MONOABS 0.5 09/23/2022 1521   EOSABS 0.1 09/23/2022 1521   BASOSABS 0.0 09/23/2022 1521    CMP     Component Value Date/Time   NA 144 05/13/2023 1052   K 4.3 05/13/2023  1052   CL 104 05/13/2023 1052   CO2 27 05/13/2023 1052   GLUCOSE 92 05/13/2023 1052   GLUCOSE 120 (H) 09/23/2022 1521   BUN 16 05/13/2023 1052   CREATININE 0.97 05/13/2023 1052   CREATININE 0.92 03/26/2013 0931   CALCIUM 9.6 05/13/2023 1052   PROT 6.1 09/23/2022 1521   ALBUMIN 3.9 09/23/2022 1521   AST 18 09/23/2022 1521   ALT 16 09/23/2022 1521   ALKPHOS 62 09/23/2022 1521   BILITOT 0.3 09/23/2022 1521   GFRNONAA 51 (L) 03/20/2018 1403   GFRAA 59 (L) 03/20/2018 1403    Assessment: 1.  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  Chronic loose stools/IBS: Continues to do well with Colestipol 1 to 2 g nightly and occasional Lomotil 3.  History of fatty liver: Continued on vitamin D 4.  GERD and dyspepsia: Doing well on Pantoprazole 40 mg daily 5.  History of adenomatous polyps: Last surveillance colonoscopy was in 2023, no further surveillance recommended based on age  Plan: 1.  Refilled medications including Creon, Lomotil and Pantoprazole for the next year 2.  Discussed patient's desire to have another colonoscopy.  Explained that given her age she is at higher risk for side effects from anesthesia as well as perforation of the bowel.  Either way she is only 2 years out from her last colonoscopy and would not recommend one now for sure.  Certainly if she starts having any symptoms including blood in her stool or weight loss then would pursue 1.  She can discuss further with Dr. Rhea Belton next year. 3.  Continue vitamin D 4.  Patient to follow in clinic in a year with Dr. Rhea Belton or sooner if necessary.  She is put in recall for this today.  Hyacinth Meeker, PA-C [facility] 06/13/2023, 10:59 AM  Cc: Bradd Canary, MD

## 2023-06-14 LAB — URINE CULTURE
MICRO NUMBER:: 15964595
SPECIMEN QUALITY:: ADEQUATE

## 2023-06-14 NOTE — Progress Notes (Signed)
Addendum: Reviewed and agree with assessment and management plan. Kadijah Shamoon M, MD  

## 2023-06-15 ENCOUNTER — Encounter: Payer: Self-pay | Admitting: Family Medicine

## 2023-06-15 ENCOUNTER — Other Ambulatory Visit: Payer: Self-pay | Admitting: Internal Medicine

## 2023-06-23 ENCOUNTER — Ambulatory Visit: Payer: Medicare Other

## 2023-06-23 DIAGNOSIS — R55 Syncope and collapse: Secondary | ICD-10-CM | POA: Diagnosis not present

## 2023-06-23 LAB — CUP PACEART REMOTE DEVICE CHECK
Date Time Interrogation Session: 20250126232128
Implantable Pulse Generator Implant Date: 20231208

## 2023-06-25 ENCOUNTER — Encounter: Payer: Self-pay | Admitting: Internal Medicine

## 2023-06-25 ENCOUNTER — Other Ambulatory Visit: Payer: Self-pay | Admitting: Family Medicine

## 2023-06-25 LAB — HM MAMMOGRAPHY

## 2023-06-26 ENCOUNTER — Encounter: Payer: Self-pay | Admitting: Family Medicine

## 2023-06-27 NOTE — Progress Notes (Signed)
 [facility] Summary Report / Loop Recorder

## 2023-06-29 NOTE — Progress Notes (Deleted)
 Cardiology Office Note Date:  06/29/2023  Patient ID:  [name], [name] 09/28/1940, MRN [id number] PCP:  Domenica Harlene LABOR, MD  Cardiologist:  Dr. Fernande    Chief Complaint:   *** post ablation visit  History of Present Illness: [name] is a 83 y.o. 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 Klien 11/04/22, loop data reviewed: isolated PVCs isolated PACs 1 episode of nonsustained ventricular tachycardia and multiple episodes of SVT atrial tachycardia the fastest 182 lasting up to 21 seconds.  Feeling OK + orthostatic dizziness, no syncope On low dose amlodipine  for BP  Referred to Dr. 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 *** sites *** otherwise   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HCC)    Cataract    bilateral and removed   CHEST PAIN    LHC 02/2011: normal cors, EF 65%   Contact dermatitis 10/13/2017   Diverticulosis    Esophageal reflux    Fatty liver    Fibrocystic breast    Hand injury, left, initial encounter 10/13/2017   Hepatic cyst    Hepatic steatosis    History of shingles    HYPOTENSION, ORTHOSTATIC    IBS (irritable bowel syndrome)    Internal hemorrhoids without mention of complication    Kidney lesion    Kidney stone    Liver cyst    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   Osteopenia    Osteopenia    Osteoporosis    Pain in ear    PALPITATIONS, RECURRENT    Pancreatitis chronic    Preventative health care    PSVT    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   SHOULDER PAIN    Syncope  and collapse    broke a rib, and a vertabre   TOBACCO USE, QUIT    VITAMIN D  DEFICIENCY     Past Surgical History:  Procedure Laterality Date   ABDOMINAL HYSTERECTOMY  1998   BREAST SURGERY     breast bx on left, benign   CARDIAC CATHETERIZATION     CATARACT EXTRACTION, BILATERAL     CATARACT EXTRACTION, BILATERAL     CHOLECYSTECTOMY  1999   COLONOSCOPY     EYE SURGERY Bilateral 04/2017, 05/2017   cataracts   KNEE SURGERY  1958   POLYPECTOMY     SVT ABLATION N/A 06/03/2023   Procedure: SVT ABLATION;  Surgeon: Waddell Danelle ORN, MD;  Location: [facility];  Service: Cardiovascular;  Laterality: N/A;   TUBAL LIGATION     with D&C    Current Outpatient Medications  Medication Sig Dispense Refill   amLODipine  (NORVASC ) 2.5 MG tablet Take 1 tablet by mouth once daily 90 tablet 2   Ascorbic Acid (VITAMIN C) 500 MG tablet Take 500 mg by mouth in the morning.     Azelaic Acid 15 % gel Apply 1 application topically at bedtime.     B Complex-C (B-COMPLEX WITH VITAMIN C) tablet Take 1 tablet by mouth in the morning.     Calcium Citrate-Vitamin D  (CALCIUM CITRATE +  D3 PO) Take 1 tablet by mouth every other day. Alternating days with the multivitamin.     cetirizine  (ZYRTEC ) 10 MG tablet Take 1 tablet (10 mg total) by mouth daily. (Patient taking differently: Take 10 mg by mouth at bedtime.) 90 tablet 3   cholecalciferol (VITAMIN D3) 25 MCG (1000 UNIT) tablet Take 1,000 Units by mouth in the morning.     colestipol  (COLESTID ) 1 g tablet Take 2 tablets (2 g total) by mouth at bedtime. 180 tablet 3   CRANBERRY PO Take 1 capsule by mouth in the morning.     diphenoxylate -atropine  (LOMOTIL ) 2.5-0.025 MG tablet Take 1 tablet by mouth 4 (four) times daily as needed for diarrhea or loose stools. 360 tablet 1   DOTTI  0.025 MG/24HR APPLY 1 PATCH TOPICALLY TO SKIN TWICE A WEEK 24 patch 0   escitalopram  (LEXAPRO ) 10 MG tablet Take 1 tablet (10 mg total) by mouth daily. 90 tablet 0   fluticasone   (FLONASE ) 50 MCG/ACT nasal spray USE 2 SPRAY(S) IN EACH NOSTRIL AS NEEDED FOR ALLERGIES OR  RHINITIS (Patient taking differently: Place 2 sprays into both nostrils at bedtime.) 48 g 1   Melatonin 5 MG CHEW Chew 5 mg by mouth at bedtime.     Multiple Vitamin (MULTIVITAMIN WITH MINERALS) TABS tablet Take 1 tablet by mouth every other day. Alternating days with the calcium supplement     Pancrelipase , Lip-Prot-Amyl, (CREON ) 24000-76000 units CPEP TAKE 6 CAPSULES BY MOUTH WITH  MEALS AND 1 TO 3 CAPSULES WITH  EACH SNACK. IF EATING HIGH FAT  ITEMS TAKE 8 CAPSULES WITH MEALS MAX 30 CAPSULES DAILY 2430 capsule 3   pantoprazole  (PROTONIX ) 40 MG tablet TAKE 1 TABLET BY MOUTH DAILY 90 tablet 3   Probiotic Product (PROBIOTIC PO) Take 1 capsule by mouth in the morning. Probiotic Gummy     tolnaftate (TINACTIN) 1 % external solution Apply 1 Application topically at bedtime.     vitamin E  180 MG (400 UNITS) capsule Take 1 capsule (400 Units total) by mouth in the morning. 90 capsule 3   No current facility-administered medications for this visit.    Allergies:   Amoxicillin, Azithromycin, Ciprofloxacin, Epinephrine, Penicillins, Sulfonamide derivatives, and Tamiflu  [oseltamivir  phosphate]   Social History:  The patient  reports that she quit smoking about 50 years ago. Her smoking use included cigarettes. She has never used smokeless tobacco. She reports that she does not drink alcohol and does not use drugs.   Family History:  The patient's family history includes Alcohol abuse in her mother; Bladder Cancer in her father; Breast cancer in her cousin and paternal aunt; Crohn's disease in her son; Dementia in her father; Diabetes in her father; Heart disease in her father, maternal grandfather, maternal grandmother, and sister; Hypertension in her mother; Hypothyroidism in her father; Lung cancer in her paternal uncle; Pancreatic cancer in her paternal aunt; Stomach cancer in her paternal uncle; Stroke in her  maternal grandfather, maternal grandmother, and mother; Uterine cancer in her paternal grandmother.  ROS:  Please see the history of present illness.   All other systems are reviewed and otherwise negative.   PHYSICAL EXAM:  VS:  There were no vitals taken for this visit. BMI: There is no height or weight on file to calculate BMI. 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 Comprehensive EP study.  2. Coronary sinus pacing and recording.  3. 3D Mapping of supraventricular tachycardia.  4. Radiofrequency ablation of supraventricular tachycardia.   01/17/22: TTE  1. Left ventricular ejection fraction, by estimation, is 60 to 65%. The  left ventricle has normal function. The left ventricle has no regional  wall motion abnormalities. There is moderate asymmetric left ventricular  hypertrophy of the basal-septal  segment. Left ventricular diastolic parameters are consistent with Grade  II diastolic dysfunction (pseudonormalization). Elevated left atrial  pressure.   2. Right ventricular systolic function is normal. The right ventricular  size is normal. Tricuspid regurgitation signal is inadequate for assessing  PA pressure.   3. The mitral valve is normal in structure. No evidence of mitral valve  regurgitation. No evidence of mitral stenosis.   4. The aortic valve is tricuspid. Aortic valve regurgitation is  not  visualized. No aortic stenosis is present.   5. The inferior vena cava is normal in size with greater than 50%  respiratory variability, suggesting right atrial pressure of 3 mmHg.   03/04/02: TTE SUMMARY  -  Left ventricular ejection fraction was estimated to be 65 %.        There was no diagnostic evidence of left ventricular regional        wall motion abnormalities.  -  There are no significant abnormalities noted  IMPRESSIONS  -  There are no significant abnormalities noted.  03/26/11: [facility] CONCLUSIONS: 1. Smooth and normal coronary arteries. 2. Normal left ventricular systolic function.  Recent Labs: 09/23/2022: ALT 16; TSH 1.45 05/13/2023: BUN 16; Creatinine, Ser 0.97; Hemoglobin 13.4; Platelets 263; Potassium 4.3; Sodium 144  09/23/2022: Cholesterol 139; HDL 80.00; LDL Cholesterol 33; Total CHOL/HDL Ratio 2; Triglycerides 132.0; VLDL 26.4   CrCl cannot be calculated (Patient's most recent lab result is older than the maximum 21 days allowed.).   Wt Readings from Last 3 Encounters:  06/13/23 124 lb (56.2 kg)  06/12/23 124 lb (56.2 kg)  06/03/23 124 lb (56.2 kg)     Other studies reviewed: Additional studies/records reviewed today include: summarized above  ASSESSMENT AND PLAN:  1. SVT     S/p AVNRT ablation    ***  2. Orthostatic hypotension 3. Syncope No recurrent syncope. ***  Disposition: ***  Current medicines are reviewed at length with the patient today.  The patient did not have any concerns regarding medicines.  Bonney Charlies Arthur, PA-C 06/29/2023 3:43 PM     [facility] [address] [place] [address] [phone] (office)  [phone] (fax)

## 2023-06-30 ENCOUNTER — Ambulatory Visit (INDEPENDENT_AMBULATORY_CARE_PROVIDER_SITE_OTHER): Payer: Medicare Other | Admitting: Family Medicine

## 2023-06-30 ENCOUNTER — Encounter: Payer: Self-pay | Admitting: Family Medicine

## 2023-06-30 ENCOUNTER — Encounter: Payer: Self-pay | Admitting: Internal Medicine

## 2023-06-30 VITALS — BP 127/73 | HR 87 | Temp 97.7°F | Ht 64.0 in | Wt 124.0 lb

## 2023-06-30 DIAGNOSIS — J3489 Other specified disorders of nose and nasal sinuses: Secondary | ICD-10-CM | POA: Diagnosis not present

## 2023-06-30 DIAGNOSIS — R0982 Postnasal drip: Secondary | ICD-10-CM | POA: Diagnosis not present

## 2023-06-30 DIAGNOSIS — U071 COVID-19: Secondary | ICD-10-CM | POA: Diagnosis not present

## 2023-06-30 LAB — POC COVID19 BINAXNOW: SARS Coronavirus 2 Ag: POSITIVE — AB

## 2023-06-30 LAB — POCT INFLUENZA A/B
Influenza A, POC: NEGATIVE
Influenza B, POC: NEGATIVE

## 2023-06-30 MED ORDER — MOLNUPIRAVIR EUA 200MG CAPSULE: 4.0000 | ORAL_CAPSULE | Freq: Two times a day (BID) | ORAL | 0 refills | Status: AC

## 2023-06-30 NOTE — Patient Instructions (Signed)
 Over the counter medications that may be helpful for symptoms:  Guaifenesin 1200 mg extended release tabs twice daily, with plenty of water For cough and congestion Brand name: Mucinex   Pseudoephedrine 30 mg, one or two tabs every 4 to 6 hours For sinus congestion Brand name: Sudafed You must get this from the pharmacy counter.  Oxymetazoline nasal spray each morning, one spray in each nostril, for NO MORE THAN 3 days  For nasal and sinus congestion Brand name: Afrin Saline nasal spray or Saline Nasal Irrigation 3-5 times a day For nasal and sinus congestion Brand names: Ocean or AYR Fluticasone nasal spray, one spray in each nostril, each morning (after oxymetazoline and saline, if used) For nasal and sinus congestion Brand name: Flonase Warm salt water gargles  For sore throat Every few hours as needed Alternate ibuprofen 400-600 mg and acetaminophen 1000 mg every 6 hours For fever, body aches, headache Brand names: Motrin or Advil and Tylenol Dextromethorphan 12-hour cough version 30 mg every 12 hours  For cough Brand name: Delsym Stop all other cold medications for now (Nyquil, Dayquil, Tylenol Cold, Theraflu, etc) and other non-prescription cough/cold preparations. Many of these have the same ingredients listed above and could cause an overdose of medication.   Herbal treatments that have been shown to be helpful in some patients include: Vitamin C 1000 mg per day Vitamin D 4000 iU per day Zinc 100 mg per day Quercetin 25-500 mg twice a day Melatonin 5-10mg  at bedtime Honey Green Tea  General Instructions Allow your body to rest Drink PLENTY of fluids Isolate yourself from everyone, even family, until test results have returned  If your COVID-19 test is positive Then you ARE INFECTED and you can pass the virus to others You must quarantine from others for a minimum of  5 days since symptoms started AND You are fever free for 24 hours WITHOUT any medication to  reduce fever AND Your symptoms are improving Wear mask for the next 5 days If not improved by day 5, quarantine for the full 10 days. Do not go to the store or other public areas Do not go around household members who are not known to be infected with COVID-19 If you MUST leave your area of quarantine (example: go to a bathroom you share with others in your home), you must Wear a mask Wash your hands thoroughly Wipe down any surfaces you touch Do not share food, drinks, towels, or other items with other persons Dispose of your own tissues, food containers, etc  Once you have recovered, please continue good preventive care measures, including:  wearing a mask when in public wash your hands frequently avoid touching your face/nose/eyes cover coughs/sneezes with the inside of your elbow stay out of crowds keep a 6 foot distance from others  If you develop severe shortness of breath, uncontrolled fevers, coughing up blood, confusion, chest pain, or signs of dehydration (such as significantly decreased urine amounts or dizziness with standing) please go to the ER.

## 2023-06-30 NOTE — Progress Notes (Signed)
Acute Office Visit  Subjective:     Patient ID: [name], female    DOB: 1940-12-16, 83 y.o.   MRN: [id number]  Chief Complaint  Patient presents with   Sinus Problem    HPI Patient is in today for  sinus pressure.    Discussed the use of AI scribe software for clinical note transcription with the patient, who gave verbal consent to proceed.  History of Present Illness   The patient presents with sinus pain and symptoms suggestive of a sinus infection.  She experiences significant sinus pain, particularly on her face, which began on Saturday night and worsened by Sunday. The pain is equally severe on both sides of her face, affecting her upper jaw and cheekbones. She has been taking over-the-counter ibuprofen for pain relief.  Additional symptoms include nasal drainage, sneezing, and a sensation of pressure in her ears. Her symptoms improved slightly after sleeping on the couch due to a non-functioning heat pump, which made the upstairs area cold.  She has allergies since moving to [facility] and uses Flonase nasal spray for management. She describes feeling very tired, with a lot of sinus pain and drainage, and mentions waking up during the night sneezing. No hoarseness or croupy cough, but she does have drainage-related coughing.  She denies having been around anyone sick recently, although her son had the flu but lives in [place], and she has not seen him. Her granddaughter recently had a baby, and she has not visited yet due to her symptoms.   No fever at home.           ROS All review of systems negative except what is listed in the HPI      Objective:    BP 127/73   Pulse 87   Temp 97.7 F (36.5 C) (Oral)   Ht 5\' 4"  (1.626 m)   Wt 124 lb (56.2 kg)   SpO2 97%   BMI 21.28 kg/m    Physical Exam Vitals reviewed.  Constitutional:      Appearance: Normal appearance.  HENT:     Head: Normocephalic and atraumatic.     Right Ear: Tympanic  membrane normal.     Left Ear: Tympanic membrane normal.     Nose: Congestion and rhinorrhea present.     Mouth/Throat:     Pharynx: No oropharyngeal exudate or posterior oropharyngeal erythema.     Comments: Postnasal drainage Cardiovascular:     Rate and Rhythm: Normal rate and regular rhythm.  Pulmonary:     Effort: Pulmonary effort is normal.     Breath sounds: Normal breath sounds. No wheezing, rhonchi or rales.  Skin:    General: Skin is warm and dry.  Neurological:     Mental Status: She is alert and oriented to person, place, and time.  Psychiatric:        Mood and Affect: Mood normal.        Behavior: Behavior normal.        Thought Content: Thought content normal.        Judgment: Judgment normal.        Results for orders placed or performed in visit on 06/30/23  POC COVID-19 BinaxNow  Result Value Ref Range   SARS Coronavirus 2 Ag Positive (A) Negative  POCT Influenza A/B  Result Value Ref Range   Influenza A, POC Negative Negative   Influenza B, POC Negative Negative        Assessment & Plan:  Problem List Items Addressed This Visit   None Visit Diagnoses       Sinus pressure    -  Primary   Relevant Orders   POC COVID-19 BinaxNow (Completed)   POCT Influenza A/B (Completed)     Post-nasal drip       Relevant Orders   POC COVID-19 BinaxNow (Completed)   POCT Influenza A/B (Completed)     COVID-19       Relevant Medications   molnupiravir EUA (LAGEVRIO) 200 mg CAPS capsule      COVID + Discussed treatment options - given potential interaction with Paxlovid and her hormone patch, she prefers not to take it. She is willing to see if molnupiravir will be covered.  Continue supportive measures including rest, hydration, humidifier use, steam showers, warm compresses to sinuses, warm liquids with lemon and honey, and over-the-counter cough, cold, and analgesics as needed.  Patient aware of signs/symptoms requiring further/urgent evaluation.     Meds ordered this encounter  Medications   molnupiravir EUA (LAGEVRIO) 200 mg CAPS capsule    Sig: Take 4 capsules (800 mg total) by mouth 2 (two) times daily for 5 days.    Dispense:  40 capsule    Refill:  0    Supervising Provider:   Danise Edge A [[id number]]    Return if symptoms worsen or fail to improve.  Clayborne Dana, NP

## 2023-07-01 ENCOUNTER — Ambulatory Visit: Payer: Medicare Other | Admitting: Physician Assistant

## 2023-07-16 NOTE — Progress Notes (Deleted)
   Electrophysiology Office Note:   Date:  07/16/2023  ID:  [name], DOB [birth year], MRN [id number]  Primary Cardiologist: None Electrophysiologist: Sherryl Manges, MD  {Click to update primary MD,subspecialty MD or APP then REFRESH:1}    History of Present Illness:   [name] is a 83 y.o. 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   Since last being seen in our clinic the patient reports doing ***.  she denies chest pain, palpitations, dyspnea, PND, orthopnea, nausea, vomiting, dizziness, syncope, edema, weight gain, or early satiety.     Review of systems complete and found to be negative unless listed in HPI.   Studies Reviewed:    EKG is ordered today. Personal review as below.       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   Wt Readings from Last 3 Encounters:  06/30/23 124 lb (56.2 kg)  06/13/23 124 lb (56.2 kg)  06/12/23 124 lb (56.2 kg)     GEN: No acute distress NECK: No JVD; No carotid bruits CARDIAC: {EPRHYTHM:28826}, no murmurs, rubs, gallops RESPIRATORY:  Clear to auscultation without rales, wheezing or rhonchi  ABDOMEN: Soft, non-tender, non-distended EXTREMITIES:  {EDEMA LEVEL:28147::"No"} edema; No deformity   ILR Interrogation- reviewed in detail today,  See PACEART report  ASSESSMENT AND PLAN:    Syncope s/p Medtronic Loop recorder Orthostatic hypotension Normal device function See Pace Art report No changes today  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 Ladona Ridgel    {Click here to Review PMH, Prob List, Meds, Allergies, SHx, FHx  :1}   Follow up with {JJOAC:16606} {EPFOLLOW TK:16010}  Signed, Graciella Freer, PA-C

## 2023-07-17 ENCOUNTER — Ambulatory Visit: Payer: Medicare Other | Admitting: Student

## 2023-07-17 DIAGNOSIS — R55 Syncope and collapse: Secondary | ICD-10-CM

## 2023-07-17 DIAGNOSIS — I471 Supraventricular tachycardia, unspecified: Secondary | ICD-10-CM

## 2023-07-28 ENCOUNTER — Encounter: Payer: Self-pay | Admitting: Internal Medicine

## 2023-07-28 ENCOUNTER — Ambulatory Visit: Payer: Medicare Other

## 2023-07-28 DIAGNOSIS — R55 Syncope and collapse: Secondary | ICD-10-CM | POA: Diagnosis not present

## 2023-07-30 LAB — CUP PACEART REMOTE DEVICE CHECK
Date Time Interrogation Session: 20250302231422
Implantable Pulse Generator Implant Date: 20231208

## 2023-07-30 NOTE — Progress Notes (Unsigned)
   Electrophysiology Office Note:   Date:  07/31/2023  ID:  [name], DOB 04-29-1941, MRN [id number]  Primary Cardiologist: None Electrophysiologist: Sherryl Manges, MD      History of Present Illness:   [name] is a 83 y.o. female with h/o syncope s/p ILR, orthostasis with possible vagal component, HTN and SVT s/p ablation as below seen today for routine electrophysiology followup.   Since last being seen in our clinic the patient reports doing well overall. She has not felt any persistent episodes since her ablation. She has brief and singular "flutters" from time to time. She does continue to have intermittent lightheadedness in the setting of her chronic orthostasis, consistent with potential vagal component shown on monitor at times (with slowing). Otherwise,  she denies chest pain, dyspnea, PND, orthopnea, nausea, vomiting, frank syncope, edema, weight gain, or early satiety.   Review of systems complete and found to be negative unless listed in HPI.   Studies Reviewed:    EKG is ordered today. Personal review as below.  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   Echo 01/17/2022 LVEF 60-65%, normal RV,   Physical Exam:   VS:  BP (!) 148/82   Pulse 79   Ht 5\' 4"  (1.626 m)   Wt 121 lb 3.2 oz (55 kg)   SpO2 98%   BMI 20.80 kg/m    Wt Readings from Last 3 Encounters:  07/31/23 121 lb 3.2 oz (55 kg)  06/30/23 124 lb (56.2 kg)  06/13/23 124 lb (56.2 kg)     GEN: No acute distress NECK: No JVD; No carotid bruits CARDIAC: Regular rate and  rhythm, no murmurs, rubs, gallops RESPIRATORY:  Clear to auscultation without rales, wheezing or rhonchi  ABDOMEN: Soft, non-tender, non-distended EXTREMITIES:  No edema; No deformity   ILR Interrogation- reviewed in detail today,  See PACEART report  ASSESSMENT AND PLAN:    Syncope s/p Medtronic Loop recorder SVT  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 To help reduce co-pays and visit burdens, would ask PCP to take over as not currently on any cardiac meds.   Discussed at length period of follow up with no acute cardiac needs, cardiac meds, and loop monitoring in place.  Follow up with EP MD in 2 years. Otherwise, we will continue to follow her remotely via her Loop Recorder and be available sooner at any time with symptoms or concerns.   Signed, Graciella Freer, PA-C

## 2023-07-31 ENCOUNTER — Ambulatory Visit: Payer: Medicare Other | Attending: Student | Admitting: Student

## 2023-07-31 ENCOUNTER — Encounter: Payer: Self-pay | Admitting: Student

## 2023-07-31 VITALS — BP 148/82 | HR 79 | Ht 64.0 in | Wt 121.2 lb

## 2023-07-31 DIAGNOSIS — R55 Syncope and collapse: Secondary | ICD-10-CM

## 2023-07-31 DIAGNOSIS — I471 Supraventricular tachycardia, unspecified: Secondary | ICD-10-CM | POA: Diagnosis not present

## 2023-07-31 DIAGNOSIS — I951 Orthostatic hypotension: Secondary | ICD-10-CM

## 2023-07-31 NOTE — Patient Instructions (Signed)
 Medication Instructions:  Your physician recommends that you continue on your current medications as directed. Please refer to the Current Medication list given to you today.  *If you need a refill on your cardiac medications before your next appointment, please call your pharmacy*  Lab Work: None  If you have labs (blood work) drawn today and your tests are completely normal, you will receive your results only by: MyChart Message (if you have MyChart) OR A paper copy in the mail If you have any lab test that is abnormal or we need to change your treatment, we will call you to review the results.  Follow-Up: At [facility], you and your health needs are our priority.  As part of our continuing mission to provide you with exceptional heart care, we have created designated Provider Care Teams.  These Care Teams include your primary Cardiologist (physician) and Advanced Practice Providers (APPs -  Physician Assistants and Nurse Practitioners) who all work together to provide you with the care you need, when you need it.  Your next appointment:   2 year(s)  Provider:   Casimiro Needle "Otilio Saber, PA-C

## 2023-08-01 NOTE — Progress Notes (Signed)
 [facility] Summary Report / Loop Recorder

## 2023-08-07 ENCOUNTER — Other Ambulatory Visit: Payer: Self-pay | Admitting: Family Medicine

## 2023-08-07 DIAGNOSIS — F419 Anxiety disorder, unspecified: Secondary | ICD-10-CM

## 2023-08-11 ENCOUNTER — Other Ambulatory Visit: Payer: Self-pay | Admitting: Internal Medicine

## 2023-08-29 NOTE — Addendum Note (Signed)
 Addended by: Geralyn Flash D on: 08/29/2023 11:50 AM   Modules accepted: Orders

## 2023-08-29 NOTE — Progress Notes (Signed)
 [facility] Summary Report / Loop Recorder

## 2023-09-01 ENCOUNTER — Ambulatory Visit (INDEPENDENT_AMBULATORY_CARE_PROVIDER_SITE_OTHER): Payer: Medicare Other

## 2023-09-01 DIAGNOSIS — R55 Syncope and collapse: Secondary | ICD-10-CM

## 2023-09-02 LAB — CUP PACEART REMOTE DEVICE CHECK
Date Time Interrogation Session: 20250406231545
Implantable Pulse Generator Implant Date: 20231208

## 2023-09-03 ENCOUNTER — Telehealth: Payer: Self-pay | Admitting: Physician Assistant

## 2023-09-03 NOTE — Telephone Encounter (Signed)
 Spoke with the pt and she tells me that she has not been able to take the colestid as prescribed.  She has a hard time swallowing it.She states the lomotil seems to help but the diarrhea has not resolved.  She has been counseled to try apple sauce, yogurt, ice cream, etc. When taking to help ease the swallowing.  She will try that and call back if not successful.Marland Kitchen

## 2023-09-03 NOTE — Telephone Encounter (Signed)
 Inbound call from patient stating she has been having complications with diarrhea. Patient is requesting a call to discuss her medications. States she has been taking lomotil but has not been helping much and requesting to know if there is stronger medication that she is able to take. Also stated she has been unable to take colestipol  due to being unable able to swallow medication. Requesting to also know if there is an alternative medication she is able to take at night. Please advise, thank you.

## 2023-09-13 ENCOUNTER — Encounter: Payer: Self-pay | Admitting: Internal Medicine

## 2023-09-19 ENCOUNTER — Other Ambulatory Visit: Payer: Self-pay | Admitting: Family Medicine

## 2023-09-19 ENCOUNTER — Encounter: Payer: Self-pay | Admitting: *Deleted

## 2023-09-23 ENCOUNTER — Telehealth: Payer: Self-pay

## 2023-09-23 NOTE — Telephone Encounter (Signed)
 Patient reports she was at the [facility] going to start exercising this morning and her heart rate was 120 bpm prior to workout. States her watch alerted her that her heart rate was increased. Patient reported of "flutters" for a short period, not new for patient she states. Denies chest pain, shortness of breath, dizziness or lightheadedness.   Medtronic monitor connected once a night, unable to see data today. Advised patient we will check monitor tomorrow 4/30 and f/u with her. Pt voiced understanding. Pt advised if symptoms return to call the device clinic. Pt voiced understanding and agreeable to plan.

## 2023-09-23 NOTE — Telephone Encounter (Signed)
 Pt called in stating her apple watch read her hr was in the 120s this morning and then it went down. Pt is concerned if ablation worked or not and would like a call back for reassurance

## 2023-09-24 NOTE — Telephone Encounter (Signed)
 Returned call to Pt.  Advised her loop recorder did not trigger for any sustained episodes.  Advised per review of her histograms, she does have a few heartbeats in the 120 bpm range, but very few.  Pt thanked nurse for call back.  She will call if she notices any further elevated heart rates or increased "flutters".

## 2023-10-06 ENCOUNTER — Ambulatory Visit (INDEPENDENT_AMBULATORY_CARE_PROVIDER_SITE_OTHER): Payer: Medicare Other

## 2023-10-06 DIAGNOSIS — R55 Syncope and collapse: Secondary | ICD-10-CM | POA: Diagnosis not present

## 2023-10-07 LAB — CUP PACEART REMOTE DEVICE CHECK
Date Time Interrogation Session: 20250511234047
Implantable Pulse Generator Implant Date: 20231208

## 2023-10-11 ENCOUNTER — Other Ambulatory Visit: Payer: Self-pay | Admitting: Internal Medicine

## 2023-10-12 ENCOUNTER — Ambulatory Visit: Payer: Self-pay | Admitting: Cardiology

## 2023-10-21 NOTE — Progress Notes (Signed)
 [facility] Summary Report / Loop Recorder

## 2023-10-21 NOTE — Addendum Note (Signed)
 Addended by: Edra Govern D on: 10/21/2023 01:32 PM   Modules accepted: Orders

## 2023-11-06 ENCOUNTER — Ambulatory Visit

## 2023-11-06 DIAGNOSIS — R55 Syncope and collapse: Secondary | ICD-10-CM

## 2023-11-06 LAB — CUP PACEART REMOTE DEVICE CHECK
Date Time Interrogation Session: 20250611232741
Implantable Pulse Generator Implant Date: 20231208

## 2023-11-07 ENCOUNTER — Ambulatory Visit: Payer: Self-pay | Admitting: Cardiology

## 2023-11-24 NOTE — Progress Notes (Signed)
 [facility] Summary Report / Loop Recorder

## 2023-11-24 NOTE — Addendum Note (Signed)
 Addended by: TAWNI DRILLING D on: 11/24/2023 05:41 PM   Modules accepted: Orders

## 2023-12-08 ENCOUNTER — Ambulatory Visit (INDEPENDENT_AMBULATORY_CARE_PROVIDER_SITE_OTHER)

## 2023-12-08 DIAGNOSIS — R55 Syncope and collapse: Secondary | ICD-10-CM

## 2023-12-08 LAB — CUP PACEART REMOTE DEVICE CHECK
Date Time Interrogation Session: 20250713234246
Implantable Pulse Generator Implant Date: 20231208

## 2023-12-09 ENCOUNTER — Ambulatory Visit: Payer: Self-pay | Admitting: Cardiology

## 2023-12-10 ENCOUNTER — Other Ambulatory Visit: Payer: Self-pay | Admitting: Family Medicine

## 2023-12-16 ENCOUNTER — Other Ambulatory Visit: Payer: Self-pay | Admitting: Internal Medicine

## 2023-12-19 ENCOUNTER — Other Ambulatory Visit: Payer: Self-pay | Admitting: Family Medicine

## 2023-12-19 MED ORDER — ESTRADIOL 0.025 MG/24HR TD PTTW: 1.0000 | MEDICATED_PATCH | TRANSDERMAL | 0 refills | Status: DC

## 2023-12-19 NOTE — Telephone Encounter (Unsigned)
 Copied from CRM [id number]. Topic: Clinical - Medication Refill >> Dec 19, 2023 12:18 PM Viola F wrote: Medication: estradiol  (DOTTI ) 0.025 MG/24HR [[id number]]  Has the patient contacted their pharmacy? Yes (Agent: If no, request that the patient contact the pharmacy for the refill. If patient does not wish to contact the pharmacy document the reason why and proceed with request.) (Agent: If yes, when and what did the pharmacy advise?)  This is the patient's preferred pharmacy:  [facility] [address] [place] - [address] [place] [place] [address] Phone: [phone] Fax: [phone]  Is this the correct pharmacy for this prescription? Yes If no, delete pharmacy and type the correct one.   Has the prescription been filled recently? Yes  Is the patient out of the medication? Yes  Has the patient been seen for an appointment in the last year OR does the patient have an upcoming appointment? Yes  Can we respond through MyChart? No  Agent: Please be advised that Rx refills may take up to 3 business days. We ask that you follow-up with your pharmacy.

## 2023-12-26 NOTE — Progress Notes (Signed)
 [facility] Summary Report / Loop Recorder

## 2024-01-05 NOTE — Assessment & Plan Note (Signed)
Encouraged to get adequate exercise, calcium and vitamin d intake last dexa 2023

## 2024-01-05 NOTE — Assessment & Plan Note (Signed)
 Supplement and monitor

## 2024-01-05 NOTE — Assessment & Plan Note (Signed)
 Longstanding and stable. Symptoms manageable

## 2024-01-08 ENCOUNTER — Ambulatory Visit (INDEPENDENT_AMBULATORY_CARE_PROVIDER_SITE_OTHER): Admitting: Family Medicine

## 2024-01-08 ENCOUNTER — Ambulatory Visit (INDEPENDENT_AMBULATORY_CARE_PROVIDER_SITE_OTHER)

## 2024-01-08 ENCOUNTER — Ambulatory Visit: Payer: Self-pay | Admitting: Family Medicine

## 2024-01-08 ENCOUNTER — Encounter: Payer: Self-pay | Admitting: Family Medicine

## 2024-01-08 VITALS — BP 118/70 | HR 68 | Resp 16 | Ht 64.0 in | Wt 122.2 lb

## 2024-01-08 DIAGNOSIS — R55 Syncope and collapse: Secondary | ICD-10-CM

## 2024-01-08 DIAGNOSIS — K861 Other chronic pancreatitis: Secondary | ICD-10-CM

## 2024-01-08 DIAGNOSIS — R768 Other specified abnormal immunological findings in serum: Secondary | ICD-10-CM

## 2024-01-08 DIAGNOSIS — M810 Age-related osteoporosis without current pathological fracture: Secondary | ICD-10-CM

## 2024-01-08 DIAGNOSIS — E559 Vitamin D deficiency, unspecified: Secondary | ICD-10-CM | POA: Diagnosis not present

## 2024-01-08 DIAGNOSIS — K760 Fatty (change of) liver, not elsewhere classified: Secondary | ICD-10-CM | POA: Diagnosis not present

## 2024-01-08 DIAGNOSIS — M1990 Unspecified osteoarthritis, unspecified site: Secondary | ICD-10-CM

## 2024-01-08 LAB — CBC WITH DIFFERENTIAL/PLATELET
Basophils Absolute: 0 K/uL (ref 0.0–0.1)
Basophils Relative: 0.4 % (ref 0.0–3.0)
Eosinophils Absolute: 0.1 K/uL (ref 0.0–0.7)
Eosinophils Relative: 0.9 % (ref 0.0–5.0)
HCT: 41.8 % (ref 36.0–46.0)
Hemoglobin: 13.9 g/dL (ref 12.0–15.0)
Lymphocytes Relative: 19.9 % (ref 12.0–46.0)
Lymphs Abs: 1.4 K/uL (ref 0.7–4.0)
MCHC: 33.2 g/dL (ref 30.0–36.0)
MCV: 90.9 fl (ref 78.0–100.0)
Monocytes Absolute: 0.5 K/uL (ref 0.1–1.0)
Monocytes Relative: 6.9 % (ref 3.0–12.0)
Neutro Abs: 5 K/uL (ref 1.4–7.7)
Neutrophils Relative %: 71.9 % (ref 43.0–77.0)
Platelets: 252 K/uL (ref 150.0–400.0)
RBC: 4.6 Mil/uL (ref 3.87–5.11)
RDW: 14.7 % (ref 11.5–15.5)
WBC: 7 K/uL (ref 4.0–10.5)

## 2024-01-08 LAB — CUP PACEART REMOTE DEVICE CHECK
Date Time Interrogation Session: 20250813232935
Implantable Pulse Generator Implant Date: 20231208

## 2024-01-08 LAB — TSH: TSH: 2.13 u[IU]/mL (ref 0.35–5.50)

## 2024-01-08 LAB — COMPREHENSIVE METABOLIC PANEL WITH GFR
ALT: 21 U/L (ref 0–35)
AST: 22 U/L (ref 0–37)
Albumin: 4.2 g/dL (ref 3.5–5.2)
Alkaline Phosphatase: 63 U/L (ref 39–117)
BUN: 17 mg/dL (ref 6–23)
CO2: 31 meq/L (ref 19–32)
Calcium: 8.9 mg/dL (ref 8.4–10.5)
Chloride: 102 meq/L (ref 96–112)
Creatinine, Ser: 0.95 mg/dL (ref 0.40–1.20)
GFR: 55.25 mL/min — ABNORMAL LOW (ref >60.00)
Glucose, Bld: 93 mg/dL (ref 70–99)
Potassium: 4.6 meq/L (ref 3.5–5.1)
Sodium: 140 meq/L (ref 135–145)
Total Bilirubin: 0.5 mg/dL (ref 0.2–1.2)
Total Protein: 6.5 g/dL (ref 6.0–8.3)

## 2024-01-08 LAB — LIPID PANEL
Cholesterol: 149 mg/dL (ref 0–200)
HDL: 99 mg/dL (ref >39.00)
LDL Cholesterol: 43 mg/dL (ref 0–99)
NonHDL: 49.68
Total CHOL/HDL Ratio: 2
Triglycerides: 35 mg/dL (ref 0.0–149.0)
VLDL: 7 mg/dL (ref 0.0–40.0)

## 2024-01-08 LAB — VITAMIN D 25 HYDROXY (VIT D DEFICIENCY, FRACTURES): VITD: 51.53 ng/mL (ref 30.00–100.00)

## 2024-01-08 NOTE — Patient Instructions (Addendum)
 Astelin  is the nasal antihistamine over the counter you can add if ears and throat continue to bother you. Can also call us  to have Singulair  added. Otherwise call ENT Increase the Cetirizine  to twice a day  [facility]

## 2024-01-08 NOTE — Progress Notes (Signed)
 Subjective:    Patient ID: [name], female    DOB: [birth year], 83 y.o.   MRN: [id number]  Chief Complaint  Patient presents with   Medical Management of Chronic Issues    Patient presents today for a 14 month follow-up.    HPI Discussed the use of AI scribe software for clinical note transcription with the patient, who gave verbal consent to proceed.  History of Present Illness [name] is an 83 year old female who presents with allergy symptoms and arthritis pain.  She experiences allergy symptoms including dysphonia without odynophagia, ear pressure, headaches, and rhinorrhea. Her eyes water, especially when lying down at night. She uses cetirizine  once daily and is considering increasing the dose during allergy season. No dysphagia, fevers, or chills.  She underwent a heart ablation and no longer experiences tachycardia, though she still has palpitations. She is not currently on heart medication.  She experiences significant arthritis pain in her hands, exacerbated by humidity. Her hands are knotted, and she describes the pain as severe. She has used aspirin cream in the past without relief. Previous testing confirmed she does not have rheumatoid arthritis. She has osteoporosis and a family history of arthritis, with her mother having had similar hand issues.  She has a history of colonoscopies with findings of pre-cancerous polyps. Her last colonoscopy was two years ago, and she is considered high risk, allowing for a colonoscopy every two years. She reports changes in bowel habits, including periods of constipation and variations in stool consistency.  She has a history of fatty liver disease and is taking vitamin E . She also has kidney disease, which she attributes to aging, and is mindful of her hydration and diet to manage it. She drinks diet, caffeine-free Pepsi and is trying to increase her water intake.    Past Medical History:  Diagnosis Date   Adenomatous colon  polyp    Allergic state    Allergy    ANXIETY, CHRONIC    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(HCC)    Cataract    bilateral and removed   CHEST PAIN    LHC 02/2011: normal cors, EF 65%   Contact dermatitis 10/13/2017   Diverticulosis    Esophageal reflux    Fatty liver    Fibrocystic breast    Hand injury, left, initial encounter 10/13/2017   Hepatic cyst    Hepatic steatosis    History of shingles    HYPOTENSION, ORTHOSTATIC    IBS (irritable bowel syndrome)    Internal hemorrhoids without mention of complication    Kidney lesion    Kidney stone    Liver cyst    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   Osteopenia    Osteopenia    Osteoporosis    Pain in ear    PALPITATIONS, RECURRENT    Pancreatitis chronic    Preventative health care    PSVT    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   SHOULDER PAIN    Syncope and collapse    broke a rib, and a vertabre   TOBACCO USE, QUIT    VITAMIN D  DEFICIENCY     Past Surgical History:  Procedure Laterality Date   ABDOMINAL HYSTERECTOMY  1998   BREAST SURGERY     breast bx on left, benign   CARDIAC CATHETERIZATION     CATARACT EXTRACTION, BILATERAL     CATARACT EXTRACTION, BILATERAL     CHOLECYSTECTOMY  1999   COLONOSCOPY  EYE SURGERY Bilateral 04/2017, 05/2017   cataracts   KNEE SURGERY  1958   POLYPECTOMY     SVT ABLATION N/A 06/03/2023   Procedure: SVT ABLATION;  Surgeon: Waddell Danelle ORN, MD;  Location[facility] INVASIVE CV LAB;  Service: Cardiovascular;  Laterality: N/A;   TUBAL LIGATION     with D&C    Family History  Problem Relation Age of Onset   Hypertension Mother    Stroke Mother    Alcohol abuse Mother    Heart disease Father    Bladder Cancer Father    Diabetes Father    Hypothyroidism Father    Dementia Father    Uterine cancer Paternal Grandmother    Heart disease Sister        rheumatic fever at 6 months, cardiomegaly MI   Heart disease Maternal Grandmother    Stroke Maternal Grandmother    Heart  disease Maternal Grandfather    Stroke Maternal Grandfather    Breast cancer Cousin    Pancreatic cancer Paternal Aunt    Stomach cancer Paternal Uncle    Breast cancer Paternal Aunt    Lung cancer Paternal Uncle    Crohn's disease Son    Colon cancer Neg Hx    Colon polyps Neg Hx    Esophageal cancer Neg Hx    Rectal cancer Neg Hx     Social History   Socioeconomic History   Marital status: Married    Spouse name: Not on file   Number of children: 1   Years of education: Not on file   Highest education level: Bachelor's degree (e.g., BA, AB, BS)  Occupational History   Occupation: Retired    Associate Professor: RETIRED  Tobacco Use   Smoking status: Former    Current packs/day: 0.00    Types: Cigarettes    Quit date: 05/27/1973    Years since quitting: 50.6   Smokeless tobacco: Never  Vaping Use   Vaping status: Never Used  Substance and Sexual Activity   Alcohol use: No   Drug use: No   Sexual activity: Not on file  Other Topics Concern   Not on file  Social History Narrative   Not on file   Social Drivers of Health   Financial Resource Strain: Low Risk  (01/07/2024)   Overall Financial Resource Strain (CARDIA)    Difficulty of Paying Living Expenses: Not hard at all  Food Insecurity: No Food Insecurity (01/07/2024)   Hunger Vital Sign    Worried About Running Out of Food in the Last Year: Never true    Ran Out of Food in the Last Year: Never true  Transportation Needs: No Transportation Needs (01/07/2024)   PRAPARE - Administrator, Civil Service (Medical): No    Lack of Transportation (Non-Medical): No  Physical Activity: Sufficiently Active (01/07/2024)   Exercise Vital Sign    Days of Exercise per Week: 3 days    Minutes of Exercise per Session: 80 min  Stress: Stress Concern Present (01/07/2024)   [employer] of Occupational Health - Occupational Stress Questionnaire    Feeling of Stress: To some extent  Social Connections: Socially Integrated  (01/07/2024)   Social Connection and Isolation Panel    Frequency of Communication with Friends and Family: Three times a week    Frequency of Social Gatherings with Friends and Family: More than three times a week    Attends Religious Services: More than 4 times per year    Active Member of  Clubs or Organizations: Yes    Attends Banker Meetings: More than 4 times per year    Marital Status: Married  Catering manager Violence: Not At Risk (05/15/2023)   Humiliation, Afraid, Rape, and Kick questionnaire    Fear of Current or Ex-Partner: No    Emotionally Abused: No    Physically Abused: No    Sexually Abused: No    Outpatient Medications Prior to Visit  Medication Sig Dispense Refill   amLODipine  (NORVASC ) 2.5 MG tablet Take 1 tablet by mouth once daily 90 tablet 3   Ascorbic Acid (VITAMIN C) 500 MG tablet Take 500 mg by mouth in the morning.     Azelaic Acid 15 % gel Apply 1 application topically at bedtime.     B Complex-C (B-COMPLEX WITH VITAMIN C) tablet Take 1 tablet by mouth in the morning.     Calcium Citrate-Vitamin D  (CALCIUM CITRATE + D3 PO) Take 1 tablet by mouth every other day. Alternating days with the multivitamin.     cetirizine  (ZYRTEC ) 10 MG tablet Take 1 tablet (10 mg total) by mouth daily. (Patient taking differently: Take 10 mg by mouth at bedtime.) 90 tablet 3   cholecalciferol (VITAMIN D3) 25 MCG (1000 UNIT) tablet Take 1,000 Units by mouth in the morning.     colestipol  (COLESTID ) 1 g tablet Take 2 tablets (2 g total) by mouth at bedtime. 180 tablet 3   CRANBERRY PO Take 1 capsule by mouth in the morning.     diphenoxylate -atropine  (LOMOTIL ) 2.5-0.025 MG tablet TAKE 1 TABLET BY MOUTH 4 TIMES DAILY AS NEEDED FOR DIARRHEA OR  LOOSE  STOOLS. 360 tablet 0   escitalopram  (LEXAPRO ) 10 MG tablet TAKE 1 TABLET BY MOUTH DAILY 90 tablet 3   estradiol  (DOTTI ) 0.025 MG/24HR Place 1 patch onto the skin 2 (two) times a week. Needs appt 8 patch 0   fluticasone   (FLONASE ) 50 MCG/ACT nasal spray USE 2 SPRAY(S) IN EACH NOSTRIL AS NEEDED FOR ALLERGIES OR  RHINITIS (Patient taking differently: Place 2 sprays into both nostrils at bedtime.) 48 g 1   Melatonin 5 MG CHEW Chew 5 mg by mouth at bedtime.     Multiple Vitamin (MULTIVITAMIN WITH MINERALS) TABS tablet Take 1 tablet by mouth every other day. Alternating days with the calcium supplement     Pancrelipase , Lip-Prot-Amyl, (CREON ) 24000-76000 units CPEP TAKE 6 CAPSULES BY MOUTH WITH  MEALS AND 1 TO 3 CAPSULES WITH  EACH SNACK. IF EATING HIGH FAT  ITEMS TAKE 8 CAPSULES WITH MEALS . MAX 30 CAPSULES DAILY 2400 capsule 3   pantoprazole  (PROTONIX ) 40 MG tablet TAKE 1 TABLET BY MOUTH DAILY 90 tablet 3   Probiotic Product (PROBIOTIC PO) Take 1 capsule by mouth in the morning. Probiotic Gummy     tolnaftate (TINACTIN) 1 % external solution Apply 1 Application topically at bedtime.     triamcinolone  cream (KENALOG ) 0.1 % Apply topically 2 (two) times daily.     vitamin E  180 MG (400 UNITS) capsule Take 1 capsule (400 Units total) by mouth in the morning. 90 capsule 3   No facility-administered medications prior to visit.    Allergies  Allergen Reactions   Amoxicillin     REACTION: unspecified   Azithromycin     REACTION: Rash   Ciprofloxacin     REACTION: GI upset   Epinephrine     REACTION: elevated heart rate   Penicillins     REACTION: rash   Sulfonamide Derivatives  REACTION: sick to stomach   Tamiflu  [Oseltamivir  Phosphate] Hives    ROS     Objective:    Physical Exam  BP 118/70   Pulse 68   Resp 16   Ht 5' 4 (1.626 m)   Wt 122 lb 3.2 oz (55.4 kg)   SpO2 95%   BMI 20.98 kg/m  Wt Readings from Last 3 Encounters:  01/08/24 122 lb 3.2 oz (55.4 kg)  07/31/23 121 lb 3.2 oz (55 kg)  06/30/23 124 lb (56.2 kg)    Diabetic Foot Exam - Simple   No data filed    Lab Results  Component Value Date   WBC 7.0 01/08/2024   HGB 13.9 01/08/2024   HCT 41.8 01/08/2024   PLT 252.0  01/08/2024   GLUCOSE 93 01/08/2024   CHOL 149 01/08/2024   TRIG 35.0 01/08/2024   HDL 99.00 01/08/2024   LDLCALC 43 01/08/2024   ALT 21 01/08/2024   AST 22 01/08/2024   NA 140 01/08/2024   K 4.6 01/08/2024   CL 102 01/08/2024   CREATININE 0.95 01/08/2024   BUN 17 01/08/2024   CO2 31 01/08/2024   TSH 2.13 01/08/2024   INR 1.00 03/22/2011   HGBA1C 5.8 09/23/2022    Lab Results  Component Value Date   TSH 2.13 01/08/2024   Lab Results  Component Value Date   WBC 7.0 01/08/2024   HGB 13.9 01/08/2024   HCT 41.8 01/08/2024   MCV 90.9 01/08/2024   PLT 252.0 01/08/2024   Lab Results  Component Value Date   NA 140 01/08/2024   K 4.6 01/08/2024   CO2 31 01/08/2024   GLUCOSE 93 01/08/2024   BUN 17 01/08/2024   CREATININE 0.95 01/08/2024   BILITOT 0.5 01/08/2024   ALKPHOS 63 01/08/2024   AST 22 01/08/2024   ALT 21 01/08/2024   PROT 6.5 01/08/2024   ALBUMIN 4.2 01/08/2024   CALCIUM 8.9 01/08/2024   ANIONGAP 11 03/20/2018   EGFR 58 (L) 05/13/2023   GFR 55.25 (L) 01/08/2024   Lab Results  Component Value Date   CHOL 149 01/08/2024   Lab Results  Component Value Date   HDL 99.00 01/08/2024   Lab Results  Component Value Date   LDLCALC 43 01/08/2024   Lab Results  Component Value Date   TRIG 35.0 01/08/2024   Lab Results  Component Value Date   CHOLHDL 2 01/08/2024   Lab Results  Component Value Date   HGBA1C 5.8 09/23/2022       Assessment & Plan:  Autoimmune pancreatitis (HCC) Assessment & Plan: Longstanding and stable. Symptoms manageable  Orders: -     Comprehensive metabolic panel with GFR -     CBC with Differential/Platelet -     Lipid panel -     TSH  Osteoporosis, unspecified osteoporosis type, unspecified pathological fracture presence Assessment & Plan: Encouraged to get adequate exercise, calcium and vitamin d  intake last dexa 2023  Orders: -     Comprehensive metabolic panel with GFR -     TSH  Vitamin D  deficiency Assessment  & Plan: Supplement and monitor   Orders: -     Comprehensive metabolic panel with GFR -     TSH -     VITAMIN D  25 Hydroxy (Vit-D Deficiency, Fractures)  Fatty liver -     Lipid panel  Arthritis -     Rheumatoid factor    Assessment and Plan Assessment & Plan Allergic rhinitis Chronic allergic rhinitis with  symptoms of nasal drainage, ear pressure, headache, and watery eyes. Clear nasal drainage present. No prior use of Singulair  or Astelin . Current treatment includes cetirizine  once daily. Discussed potential escalation of treatment with additional medications. She is concerned about heart-related side effects but willing to try increasing cetirizine  dosage. 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 No family history of rheumatoid arthritis. Previous rheumatoid factor test was negative, but it has been a long time since last checked. Discussed topical treatments for pain relief and maintaining joint mobility. - Order rheumatoid factor test - Recommend stress ball exercises to maintain joint mobility - Suggest use of Voltaren gel for inflammation and lidocaine for pain relief  Chronic kidney disease Chronic kidney disease, likely age-related. Discussed the importance of avoiding nephrotoxic medications and maintaining hydration. Advised against the use of diet sodas due to potential kidney stress. - Avoid nephrotoxic medications such as Advil - Encourage hydration with water, aiming for five ounces every hour - Advise reducing intake of diet sodas to protect kidney function  History of  colon polyps, including precancerous polyps History of colon polyps, including precancerous polyps. Last colonoscopy was two years ago with findings of three polyps, two of which were precancerous. Discussed the lack of current availability of a blood test for colon cancer screening. She is advised to monitor for changes in bowel habits and report significant changes. - Monitor for changes in bowel habits and report to gastroenterologist if significant changes occur - Await availability of new blood test for colon cancer screening  Cardiac arrhythmia status post ablation Status post heart ablation for cardiac arrhythmia. Still experiencing flutters but no high heart rate. Discussed the importance of monitoring symptoms and contacting cardiologist if symptoms escalate. - Monitor for escalation of symptoms and contact cardiologist if necessary  Constipation with variable bowel habits Constipation with variable bowel habits, including periods of no bowel movement followed by loose stools. No blood noted in stools. Discussed the importance of monitoring for significant changes in bowel habits. - Monitor bowel habits and report significant changes to gastroenterologist  Recording duration: 17 minutes     Harlene Horton, MD

## 2024-01-09 ENCOUNTER — Ambulatory Visit: Payer: Self-pay | Admitting: Cardiology

## 2024-01-09 ENCOUNTER — Other Ambulatory Visit: Payer: Self-pay

## 2024-01-09 LAB — RHEUMATOID FACTOR: Rheumatoid fact SerPl-aCnc: 56 [IU]/mL — ABNORMAL HIGH (ref <14)

## 2024-01-09 MED ORDER — ESTRADIOL 0.025 MG/24HR TD PTTW: 1.0000 | MEDICATED_PATCH | TRANSDERMAL | 1 refills | Status: DC

## 2024-01-09 NOTE — Telephone Encounter (Signed)
 Patient requested a medication refill for [name]  patches and refill request was send into pharmacy.

## 2024-01-11 ENCOUNTER — Other Ambulatory Visit: Payer: Self-pay | Admitting: Family Medicine

## 2024-01-11 DIAGNOSIS — R768 Other specified abnormal immunological findings in serum: Secondary | ICD-10-CM

## 2024-01-11 DIAGNOSIS — M1990 Unspecified osteoarthritis, unspecified site: Secondary | ICD-10-CM

## 2024-02-02 ENCOUNTER — Encounter (HOSPITAL_BASED_OUTPATIENT_CLINIC_OR_DEPARTMENT_OTHER): Payer: Self-pay | Admitting: Emergency Medicine

## 2024-02-02 ENCOUNTER — Emergency Department (HOSPITAL_BASED_OUTPATIENT_CLINIC_OR_DEPARTMENT_OTHER)

## 2024-02-02 ENCOUNTER — Other Ambulatory Visit: Payer: Self-pay

## 2024-02-02 ENCOUNTER — Emergency Department (HOSPITAL_BASED_OUTPATIENT_CLINIC_OR_DEPARTMENT_OTHER)
Admission: EM | Admit: 2024-02-02 | Discharge: 2024-02-02 | Disposition: A | Discharge status: Home / Self Care | Attending: Emergency Medicine | Admitting: Emergency Medicine

## 2024-02-02 DIAGNOSIS — W01198A Fall on same level from slipping, tripping and stumbling with subsequent striking against other object, initial encounter: Secondary | ICD-10-CM | POA: Insufficient documentation

## 2024-02-02 DIAGNOSIS — Y92093 Driveway of other non-institutional residence as the place of occurrence of the external cause: Secondary | ICD-10-CM | POA: Diagnosis not present

## 2024-02-02 DIAGNOSIS — S80212A Abrasion, left knee, initial encounter: Secondary | ICD-10-CM | POA: Diagnosis not present

## 2024-02-02 DIAGNOSIS — S0990XA Unspecified injury of head, initial encounter: Secondary | ICD-10-CM | POA: Diagnosis present

## 2024-02-02 DIAGNOSIS — W19XXXA Unspecified fall, initial encounter: Secondary | ICD-10-CM

## 2024-02-02 DIAGNOSIS — R030 Elevated blood-pressure reading, without diagnosis of hypertension: Secondary | ICD-10-CM

## 2024-02-02 MED ORDER — LIDOCAINE HCL (PF) 1 % IJ SOLN
5.0000 mL | Freq: Once | INTRAMUSCULAR | Status: DC
  Filled 2024-02-02: qty 5

## 2024-02-02 NOTE — Discharge Instructions (Addendum)
 CT scan of the head, neck, and face did not show any fractures.  No other concerning findings.  Your cut on the finger was repaired with glue.  This will come off on its own in about 4 days.  You can wash your hands but do not scrub.  Return for any concerning symptoms. Follow-up with your primary care doctor regarding your blood pressure.  It was somewhat elevated today.  You had no concerning symptoms along with it.  If you do develop chest pain, shortness of breath, balance issues return for reevaluation.  Continue taking your home medicine.

## 2024-02-02 NOTE — ED Triage Notes (Signed)
 PT BIBA from home reports mechanical fall in driveway this AM. C/o nausea after fall, mild nausea at time of triage.   Pt with abrasion to L cheek, L hand, BL knees. Reports she fell face forward, c/o headache, jaw pain.

## 2024-02-02 NOTE — ED Notes (Signed)
 Discharge instructions reviewed with patient. Patient verbalizes understanding, no further questions at this time. Medications and follow up information provided. No acute distress noted at time of departure.

## 2024-02-02 NOTE — ED Provider Notes (Cosign Needed Addendum)
 [place] EMERGENCY DEPARTMENT AT [facility] [place] Provider Note   CSN: [id number] Arrival date & time: 02/02/24  1207     Patient presents with: No chief complaint on file.   [name] is a 83 y.o. female.   83 year old female presents today for concern of a mechanical fall that occurred in her driveway.  She states there was a piece that was elevated above the rest causing her to trip and fall.  She struck the left side of her face.  She also has multiple small abrasions to the left hand and a bruise underneath her Apple Watch.  There is an area on the left middle finger that is bleeding and has a small superficial laceration.  Denies any blood thinner use.  The history is provided by the patient. No language interpreter was used.       Prior to Admission medications   Medication Sig Start Date End Date Taking? Authorizing Provider  amLODipine  (NORVASC ) 2.5 MG tablet Take 1 tablet by mouth once daily 12/17/23   Lesia Ozell Barter, PA-C  Ascorbic Acid (VITAMIN C) 500 MG tablet Take 500 mg by mouth in the morning.    [provider]  Azelaic Acid 15 % gel Apply 1 application topically at bedtime. 12/29/20   [provider]  B Complex-C (B-COMPLEX WITH VITAMIN C) tablet Take 1 tablet by mouth in the morning.    [provider]  Calcium Citrate-Vitamin D  (CALCIUM CITRATE + D3 PO) Take 1 tablet by mouth every other day. Alternating days with the multivitamin.    [provider]  cetirizine  (ZYRTEC ) 10 MG tablet Take 1 tablet (10 mg total) by mouth daily. Patient taking differently: Take 10 mg by mouth at bedtime. 06/04/20   Domenica Harlene LABOR, MD  cholecalciferol (VITAMIN D3) 25 MCG (1000 UNIT) tablet Take 1,000 Units by mouth in the morning.    [provider]  colestipol  (COLESTID ) 1 g tablet Take 2 tablets (2 g total) by mouth at bedtime. 06/13/23   Beather Delon Gibson, PA  CRANBERRY PO Take 1 capsule by mouth in the morning.     [provider]  diphenoxylate -atropine  (LOMOTIL ) 2.5-0.025 MG tablet TAKE 1 TABLET BY MOUTH 4 TIMES DAILY AS NEEDED FOR DIARRHEA OR  LOOSE  STOOLS. 10/13/23   Pyrtle, Gordy HERO, MD  escitalopram  (LEXAPRO ) 10 MG tablet TAKE 1 TABLET BY MOUTH DAILY 08/08/23   Domenica Harlene LABOR, MD  estradiol  (DOTTI ) 0.025 MG/24HR Place 1 patch onto the skin 2 (two) times a week. 01/12/24   Domenica Harlene LABOR, MD  fluticasone  (FLONASE ) 50 MCG/ACT nasal spray USE 2 SPRAY(S) IN EACH NOSTRIL AS NEEDED FOR ALLERGIES OR  RHINITIS Patient taking differently: Place 2 sprays into both nostrils at bedtime. 08/12/22   Domenica Harlene LABOR, MD  Melatonin 5 MG CHEW Chew 5 mg by mouth at bedtime.    [provider]  Multiple Vitamin (MULTIVITAMIN WITH MINERALS) TABS tablet Take 1 tablet by mouth every other day. Alternating days with the calcium supplement    [provider]  Pancrelipase , Lip-Prot-Amyl, (CREON ) 24000-76000 units CPEP TAKE 6 CAPSULES BY MOUTH WITH  MEALS AND 1 TO 3 CAPSULES WITH  EACH SNACK. IF EATING HIGH FAT  ITEMS TAKE 8 CAPSULES WITH MEALS . MAX 30 CAPSULES DAILY 08/11/23   Pyrtle, Gordy HERO, MD  pantoprazole  (PROTONIX ) 40 MG tablet TAKE 1 TABLET BY MOUTH DAILY 06/16/23   Pyrtle, Gordy HERO, MD  Probiotic Product (PROBIOTIC PO) Take 1  capsule by mouth in the morning. Probiotic Gummy    [provider]  tolnaftate (TINACTIN) 1 % external solution Apply 1 Application topically at bedtime.    [provider]  triamcinolone  cream (KENALOG ) 0.1 % Apply topically 2 (two) times daily. 07/08/23   [provider]  vitamin E  180 MG (400 UNITS) capsule Take 1 capsule (400 Units total) by mouth in the morning. 06/13/23   Beather Delon Gibson, PA    Allergies: Amoxicillin, Azithromycin, Ciprofloxacin, Epinephrine, Penicillins, Sulfonamide derivatives, and Tamiflu  [oseltamivir  phosphate]    Review of Systems  Constitutional:  Negative for chills and fever.  Eyes:  Negative for visual disturbance.   Respiratory:  Negative for shortness of breath.   Skin:  Positive for wound.  Neurological:  Negative for light-headedness and headaches.  All other systems reviewed and are negative.   Updated Vital Signs BP (!) 178/90 (BP Location: Right Arm)   Pulse 77   Temp (!) 97.1 F (36.2 C)   Resp 16   Ht 5' 4 (1.626 m)   Wt 54.9 kg   SpO2 94%   BMI 20.77 kg/m   Physical Exam Vitals and nursing note reviewed.  Constitutional:      General: She is not in acute distress.    Appearance: Normal appearance. She is not ill-appearing.  HENT:     Head: Normocephalic and atraumatic.     Nose: Nose normal.  Eyes:     Conjunctiva/sclera: Conjunctivae normal.  Cardiovascular:     Rate and Rhythm: Normal rate and regular rhythm.  Pulmonary:     Effort: Pulmonary effort is normal. No respiratory distress.  Musculoskeletal:        General: No deformity. Normal range of motion.     Comments: Good range of motion in bilateral upper and lower extremities with good strength.  No tenderness to palpation over both ankles, both knees, both hips, both wrists, both elbows, both shoulders.  There is a small superficial abrasion to the left knee.  Well-healed scar to the left knee as well.  Good range of motion in the left knee.  No tenderness palpation.  Neurovascularly intact. Cervical, thoracic, lumbar spine without tenderness to palpation.  Skin:    Findings: No rash.  Neurological:     Mental Status: She is alert.     (all labs ordered are listed, but only abnormal results are displayed) Labs Reviewed - No data to display  EKG: None  Radiology: No results found.   SABRA.Lac repair Mystique Bjelland  Date/Time: 02/02/2024 2:59 PM  Performed by: Hildegard Loge, PA-C Authorized by: Hildegard Loge, PA-C   Consent:    Consent obtained:  Verbal   Consent given by:  Patient   Risks discussed:  Need for additional repair, infection, retained foreign body, poor cosmetic result and poor wound healing   Alternatives  discussed:  No treatment Universal protocol:    Procedure explained and questions answered to patient or proxy's satisfaction: yes     Relevant documents present and verified: yes     Patient identity confirmed:  Verbally with patient and arm band Laceration details:    Location:  Finger   Finger location:  L ring finger   Length (cm):  0.5 Pre-procedure details:    Preparation:  Patient was prepped and draped in usual sterile fashion Treatment:    Area cleansed with:  Saline and povidone-iodine   Amount of cleaning:  Extensive   Irrigation solution:  Sterile saline   Irrigation volume:  250   Irrigation method:  Tap   Debridement:  None   Undermining:  None Skin repair:    Repair method:  Tissue adhesive Approximation:    Approximation:  Close Repair type:    Repair type:  Simple Post-procedure details:    Dressing:  Open (no dressing)   Procedure completion:  Tolerated well, no immediate complications    Medications Ordered in the ED  lidocaine  (PF) (XYLOCAINE ) 1 % injection 5 mL (has no administration in time range)                                    Medical Decision Making Amount and/or Complexity of Data Reviewed Radiology: ordered.  Risk Prescription drug management.   83 year old female presents following a fall.  Not on anticoagulation.  No loss consciousness.  Did hit the left side of her face during the fall.  She fell out of her outstretched left hand.  There is some abrasions on the left hand and a bruise at the site where she has her watch on the left arm.  No tenderness over the left wrist.  Good range of motion.  Mild bleeding at the left middle finger abrasion on the distal tip around the nailbed.  Mild bleeding coming from the nailbed as well.  We discussed removing the nailbed and evaluating this but patient declines.  This was repaired with Dermabond since it is superficial. Pending CT imaging.  Ordered CT head, C-spine, maxillofacial. CT head, CT  C-spine, CT maxillofacial without acute findings. Patient stable for discharge. Discharged in stable condition.  Return precaution discussed. BP elevated.  No signs of endorgan damage.  Discussed strict return precautions.  Discussed follow-up with PCP.  Final diagnoses:  Fall, initial encounter  Injury of head, initial encounter  Elevated blood pressure reading    ED Discharge Orders     None          Hildegard Loge, PA-C 02/02/24 1500    Hildegard Loge, PA-C 02/02/24 1500    Long, Joshua G, MD 02/07/24 [phone]

## 2024-02-02 NOTE — ED Notes (Signed)
 Cleaned wounds with wound cleanser

## 2024-02-09 ENCOUNTER — Ambulatory Visit (INDEPENDENT_AMBULATORY_CARE_PROVIDER_SITE_OTHER)

## 2024-02-09 DIAGNOSIS — R55 Syncope and collapse: Secondary | ICD-10-CM | POA: Diagnosis not present

## 2024-02-09 LAB — CUP PACEART REMOTE DEVICE CHECK
Date Time Interrogation Session: 20250913231415
Implantable Pulse Generator Implant Date: 20231208

## 2024-02-09 NOTE — Progress Notes (Unsigned)
 Office Visit Note  Patient: [name]             Date of Birth: 1940/07/15           MRN: [id number]             PCP: Domenica Harlene LABOR, MD Referring: Domenica Harlene LABOR, MD Visit Date: 02/10/2024 Occupation: @GUAROCC @  Subjective:  No chief complaint on file.    History of Present Illness: [name] is a 83 y.o. female ***   Activities of Daily Living:  Patient reports morning stiffness for *** {minute/hour:19697}.   Patient {ACTIONS;DENIES/REPORTS:21021675::Denies} nocturnal pain.  Difficulty dressing/grooming: {ACTIONS;DENIES/REPORTS:21021675::Denies} Difficulty climbing stairs: {ACTIONS;DENIES/REPORTS:21021675::Denies} Difficulty getting out of chair: {ACTIONS;DENIES/REPORTS:21021675::Denies} Difficulty using hands for taps, buttons, cutlery, and/or writing: {ACTIONS;DENIES/REPORTS:21021675::Denies}  No Rheumatology ROS completed.    Rheum History: # Diagnosed in ***.  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   Prednisone  adverse reaction 11/16/2010   Inner ear disease 11/16/2010   Eustachian tube dysfunction 11/09/2010   IBS (irritable bowel syndrome) 09/27/2010   HYPOTENSION, ORTHOSTATIC  02/26/2010   PALPITATIONS, RECURRENT 02/26/2010   Full incontinence of feces 06/20/2009   TOBACCO USE, QUIT 03/29/2009   CHEST PAIN 12/13/2008   Abdominal pain, epigastric 12/13/2008   Anxiety state 08/29/2007   Vitamin D  deficiency 05/01/2007   GERD 05/01/2007   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   OSTEOARTHRITIS 01/10/2007    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HCC)    Cataract    bilateral and removed   CHEST PAIN    LHC 02/2011: normal cors, EF 65%   Contact dermatitis 10/13/2017   Diverticulosis    Esophageal reflux    Fatty liver    Fibrocystic breast    Hand injury, left, initial encounter 10/13/2017   Hepatic cyst    Hepatic steatosis    History of shingles    HYPOTENSION, ORTHOSTATIC    IBS (irritable bowel syndrome)    Internal hemorrhoids without mention of complication    Kidney lesion    Kidney stone    Liver cyst    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   Osteopenia    Osteopenia    Osteoporosis    Pain in ear    PALPITATIONS, RECURRENT    Pancreatitis chronic    Preventative health care    PSVT    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   SHOULDER PAIN    Syncope and collapse    broke a rib, and a vertabre   TOBACCO USE, QUIT    VITAMIN D  DEFICIENCY     Family History  Problem Relation Age of Onset  Hypertension Mother    Stroke Mother    Alcohol abuse Mother    Heart disease Father    Bladder Cancer Father    Diabetes Father    Hypothyroidism Father    Dementia Father    Uterine cancer Paternal Grandmother    Heart disease Sister        rheumatic fever at 6 months, cardiomegaly MI   Heart disease Maternal Grandmother    Stroke Maternal Grandmother    Heart disease Maternal Grandfather    Stroke Maternal Grandfather    Breast cancer Cousin    Pancreatic cancer Paternal Aunt    Stomach cancer Paternal Uncle    Breast cancer Paternal Aunt     Lung cancer Paternal Uncle    Crohn's disease Son    Colon cancer Neg Hx    Colon polyps Neg Hx    Esophageal cancer Neg Hx    Rectal cancer Neg Hx    Past Surgical History:  Procedure Laterality Date   ABDOMINAL HYSTERECTOMY  1998   BREAST SURGERY     breast bx on left, benign   CARDIAC CATHETERIZATION     CATARACT EXTRACTION, BILATERAL     CATARACT EXTRACTION, BILATERAL     CHOLECYSTECTOMY  1999   COLONOSCOPY     EYE SURGERY Bilateral 04/2017, 05/2017   cataracts   KNEE SURGERY  1958   POLYPECTOMY     SVT ABLATION N/A 06/03/2023   Procedure: SVT ABLATION;  Surgeon: Waddell Danelle ORN, MD;  Location: [facility] INVASIVE [facility];  Service: Cardiovascular;  Laterality: N/A;   TUBAL LIGATION     with D&C   Social History   Social History Narrative   Not on file   Immunization History  Administered Date(s) Administered   INFLUENZA, HIGH DOSE SEASONAL PF 04/02/2013, 04/14/2014, 03/31/2015, 04/03/2017, 03/30/2018, 04/09/2019   Influenza Split 04/02/2011, 04/01/2012   Influenza Whole 06/28/2005, 03/25/2008, 03/29/2009, 03/07/2010   Influenza,inj,Quad PF,6+ Mos 03/19/2016   PFIZER(Purple Top)SARS-COV-2 Vaccination 07/11/2019, 08/03/2019   Pneumococcal Conjugate-13 09/24/2013   Pneumococcal Polysaccharide-23 05/01/2007   Td 10/13/2017   Tdap 05/27/2006   Zoster Recombinant(Shingrix) 11/05/2021, 01/08/2022, 02/17/2022, 02/24/2022   Zoster, Live 09/01/2007     Objective: Vital Signs: There were no vitals taken for this visit.   Physical Exam Vitals and nursing note reviewed.  HENT:     Head: Normocephalic and atraumatic.     Nose: Nose normal.  Eyes:     Conjunctiva/sclera: Conjunctivae normal.     Pupils: Pupils are equal, round, and reactive to light.  Cardiovascular:     Rate and Rhythm: Normal rate and regular rhythm.     Heart sounds: Normal heart sounds.  Pulmonary:     Effort: Pulmonary effort is normal.     Breath sounds: Normal breath sounds.  Skin:    General: Skin is  warm and dry.  Neurological:     Mental Status: She is alert. Mental status is at baseline.  Psychiatric:        Mood and Affect: Mood normal.        Behavior: Behavior normal.      Musculoskeletal Exam: ***  CDAI Exam: CDAI Score: -- Patient Global: --; Provider Global: -- Swollen: --; Tender: -- Joint Exam 02/10/2024   No joint exam has been documented for this visit   There is currently no information documented on the homunculus. Go to the Rheumatology activity and complete the homunculus joint exam.  Investigation: No additional findings.  Imaging: CUP PACEART  REMOTE DEVICE CHECK Result Date: 02/09/2024 ILR summary report received. Battery status OK. Normal device function. No new symptom, tachy, brady, or pause episodes. No new AF episodes. Monthly summary reports and ROV/PRN. [facility], [facility]  CT Maxillofacial Wo Contrast Result Date: 02/02/2024 CLINICAL DATA:  Fall EXAM: CT MAXILLOFACIAL WITHOUT CONTRAST TECHNIQUE: Multidetector CT imaging of the maxillofacial structures was performed. Multiplanar CT image reconstructions were also generated. RADIATION DOSE REDUCTION: This exam was performed according to the departmental dose-optimization program which includes automated exposure control, adjustment of the mA and/or kV according to patient size and/or use of iterative reconstruction technique. COMPARISON:  None Available. FINDINGS: The orbital walls and orbital rims are intact. The zygomatic arches are intact. The pterygoid plates are intact. Small amount of fluid in the left maxillary sinus. The mandible is intact. No nasal bone fracture identified. Other comments: None IMPRESSION: No fracture Electronically Signed   By: Nancyann Burns M.D.   On: 02/02/2024 14:38   CT Cervical Spine Wo Contrast Result Date: 02/02/2024 CLINICAL DATA:  Fall EXAM: CT CERVICAL SPINE WITHOUT CONTRAST TECHNIQUE: Multidetector CT imaging of the cervical spine was performed without intravenous contrast.  Multiplanar CT image reconstructions were also generated. RADIATION DOSE REDUCTION: This exam was performed according to the departmental dose-optimization program which includes automated exposure control, adjustment of the mA and/or kV according to patient size and/or use of iterative reconstruction technique. COMPARISON:  None Available. FINDINGS: No significant alignment abnormality. The craniocervical junction is normal. The atlantoaxial articulation is normal. The dens is normal. There is no fracture identified. Other comments: There is degenerative disc disease at C5-6 and C6-7 IMPRESSION: No fracture Electronically Signed   By: Nancyann Burns M.D.   On: 02/02/2024 14:36   CT Head Wo Contrast Result Date: 02/02/2024 CLINICAL DATA:  Fall EXAM: CT HEAD WITHOUT CONTRAST TECHNIQUE: Contiguous axial images were obtained from the base of the skull through the vertex without intravenous contrast. RADIATION DOSE REDUCTION: This exam was performed according to the departmental dose-optimization program which includes automated exposure control, adjustment of the mA and/or kV according to patient size and/or use of iterative reconstruction technique. COMPARISON:  None Available. FINDINGS: CT HEAD: Attenuation in the brain parenchyma is normal There is no hemorrhage. No acute ischemic changes. No mass lesion. The ventricles are normal. Skull/sinuses/orbits: No significant abnormality. IMPRESSION: Normal Electronically Signed   By: Nancyann Burns M.D.   On: 02/02/2024 14:35    Recent Labs: Lab Results  Component Value Date   WBC 7.0 01/08/2024   HGB 13.9 01/08/2024   PLT 252.0 01/08/2024   NA 140 01/08/2024   K 4.6 01/08/2024   CL 102 01/08/2024   CO2 31 01/08/2024   GLUCOSE 93 01/08/2024   BUN 17 01/08/2024   CREATININE 0.95 01/08/2024   BILITOT 0.5 01/08/2024   ALKPHOS 63 01/08/2024   AST 22 01/08/2024   ALT 21 01/08/2024   PROT 6.5 01/08/2024   ALBUMIN 4.2 01/08/2024   CALCIUM 8.9 01/08/2024    GFRAA 59 (L) 03/20/2018   Lab Results  Component Value Date   ANA NEG 09/07/2010   RF 56 (H) 01/08/2024    Speciality Comments: No specialty comments available.  Procedures:  No procedures performed Allergies: Amoxicillin, Azithromycin, Ciprofloxacin, Epinephrine, Penicillins, Sulfonamide derivatives, and Tamiflu  [oseltamivir  phosphate]   Assessment / Plan:     Visit Diagnoses: No diagnosis found.  #High risk medication use  Orders: No orders of the defined types were placed in this encounter.  No orders of the  defined types were placed in this encounter.   I personally spent a total of *** minutes in the care of the patient today including {Time Based Coding:210964241}.  Follow-Up Instructions: No follow-ups on file.   Asberry Claw, DO

## 2024-02-10 ENCOUNTER — Ambulatory Visit

## 2024-02-10 ENCOUNTER — Ambulatory Visit (INDEPENDENT_AMBULATORY_CARE_PROVIDER_SITE_OTHER)

## 2024-02-10 VITALS — BP 156/85 | HR 72 | Temp 98.3°F | Resp 17 | Ht 64.0 in | Wt 121.0 lb

## 2024-02-10 DIAGNOSIS — R768 Other specified abnormal immunological findings in serum: Secondary | ICD-10-CM | POA: Diagnosis not present

## 2024-02-10 DIAGNOSIS — M154 Erosive (osteo)arthritis: Secondary | ICD-10-CM

## 2024-02-10 DIAGNOSIS — M79641 Pain in right hand: Secondary | ICD-10-CM | POA: Diagnosis not present

## 2024-02-10 DIAGNOSIS — K861 Other chronic pancreatitis: Secondary | ICD-10-CM

## 2024-02-10 DIAGNOSIS — M2550 Pain in unspecified joint: Secondary | ICD-10-CM

## 2024-02-10 DIAGNOSIS — M810 Age-related osteoporosis without current pathological fracture: Secondary | ICD-10-CM

## 2024-02-10 DIAGNOSIS — M79642 Pain in left hand: Secondary | ICD-10-CM

## 2024-02-12 ENCOUNTER — Ambulatory Visit: Payer: Self-pay | Admitting: Cardiology

## 2024-02-14 NOTE — Progress Notes (Signed)
 Remote Loop Recorder Transmission

## 2024-02-15 LAB — IGG: IgG (Immunoglobin G), Serum: 957 mg/dL (ref 600–1540)

## 2024-02-15 LAB — URIC ACID: Uric Acid, Serum: 4 mg/dL (ref 2.5–7.0)

## 2024-02-15 LAB — CYCLIC CITRUL PEPTIDE ANTIBODY, IGG: Cyclic Citrullin Peptide Ab: <16 U

## 2024-02-15 LAB — C-REACTIVE PROTEIN: CRP: <3 mg/L (ref <8.0)

## 2024-02-15 LAB — SEDIMENTATION RATE: Sed Rate: 6 mm/h (ref 0–30)

## 2024-02-16 ENCOUNTER — Ambulatory Visit: Payer: Self-pay

## 2024-02-18 NOTE — Progress Notes (Signed)
 Remote Loop Recorder Transmission

## 2024-03-01 ENCOUNTER — Ambulatory Visit

## 2024-03-03 NOTE — Progress Notes (Addendum)
 Office Visit Note  Patient: [name]             Date of Birth: 27-Jul-1940           MRN: [id number]             PCP: Domenica Harlene LABOR, MD Referring: Domenica Harlene LABOR, MD Visit Date: 03/17/2024 Occupation: Data Unavailable  Subjective:  Results (Patient states she does not want to go on the medication recommended at her last appointment)   History of Present Illness: [name] is a 83 y.o. female presenting for new patient follow-up. She was last seen as a new patient on 02/10/24 for joint pain. At that visit, work-up performed that demonstrated negative inflammatory markers, negative CCP, and normal uric acid level. 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 IGG level also checked to rule out IGG4-RD given hx of autoimmune pancreatitis.  Discussed findings with patient. Discussed diagnosis of erosive OA. Discussed no concern for inflammatory arthritis and that her positive RF is likely a normal variant given her age and of no clinical concern.  Patient states that she does not wish to try Cymbalta at this time given that her symptoms are manageable. Discussed with patient that she can always consider this in the future if her symptoms get worse on interfere with her ADL's.      Activities of Daily Living:  Patient reports morning stiffness for 0 minutes.   Patient Denies nocturnal pain.  Difficulty dressing/grooming: Denies Difficulty climbing stairs: Denies Difficulty getting out of chair: Denies Difficulty using hands for taps, buttons, cutlery, and/or writing: Reports  Review of Systems  Constitutional:  Positive for fatigue.  HENT:  Negative for mouth sores and mouth dryness.   Eyes:  Negative for dryness.  Respiratory:  Negative for shortness of breath.   Cardiovascular:  Positive for palpitations. Negative for chest pain.  Gastrointestinal:  Positive for blood in stool. Negative for  constipation and diarrhea.  Endocrine: Negative for increased urination.  Genitourinary:  Negative for involuntary urination.  Musculoskeletal:  Positive for joint pain, joint pain and joint swelling. Negative for gait problem, myalgias, muscle weakness, morning stiffness, muscle tenderness and myalgias.  Skin:  Positive for hair loss. Negative for color change, rash and sensitivity to sunlight.  Allergic/Immunologic: Negative for susceptible to infections.  Neurological:  Negative for dizziness and headaches.  Hematological:  Negative for swollen glands.  Psychiatric/Behavioral:  Positive for sleep disturbance. Negative for depressed mood. The patient is nervous/anxious.     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   Prednisone  adverse reaction 11/16/2010   Inner ear disease 11/16/2010   Eustachian tube dysfunction 11/09/2010   IBS (irritable bowel syndrome) 09/27/2010   HYPOTENSION, ORTHOSTATIC 02/26/2010   PALPITATIONS, RECURRENT 02/26/2010   Full incontinence of feces 06/20/2009   TOBACCO USE, QUIT 03/29/2009   CHEST PAIN 12/13/2008   Abdominal  pain, epigastric 12/13/2008   Anxiety state 08/29/2007   Vitamin D  deficiency 05/01/2007   GERD 05/01/2007   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   OSTEOARTHRITIS 01/10/2007    Past Medical History:  Diagnosis Date   Adenomatous colon polyp    Allergic state     Allergy    ANXIETY, CHRONIC    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(HCC)    Cataract    bilateral and removed   CHEST PAIN    LHC 02/2011: normal cors, EF 65%   Contact dermatitis 10/13/2017   Diverticulosis    Esophageal reflux    Fatty liver    Fibrocystic breast    Hand injury, left, initial encounter 10/13/2017   Hepatic cyst    Hepatic steatosis    History of shingles    HYPOTENSION, ORTHOSTATIC    IBS (irritable bowel syndrome)    Internal hemorrhoids without mention of complication    Kidney lesion    Kidney stone    Liver cyst    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   Osteopenia    Osteopenia    Osteoporosis    Pain in ear    PALPITATIONS, RECURRENT    Pancreatitis chronic    Preventative health care    PSVT    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   SHOULDER PAIN    Syncope and collapse    broke a rib, and a vertabre   TOBACCO USE, QUIT    VITAMIN D  DEFICIENCY     Family History  Problem Relation Age of Onset   Hypertension Mother    Stroke Mother    Heart disease Father    Bladder Cancer Father    Diabetes Father    Hypothyroidism Father    Dementia Father    Heart disease Sister        rheumatic fever at 6 months, cardiomegaly MI   Pancreatic cancer Paternal Aunt    Breast cancer Paternal Aunt    Stomach cancer Paternal Uncle    Lung cancer Paternal Uncle    Heart disease Maternal Grandmother    Stroke Maternal Grandmother    Heart disease Maternal Grandfather    Stroke Maternal Grandfather    Uterine cancer Paternal Grandmother    Crohn's disease Son    Breast cancer Cousin    Colon cancer Neg Hx    Colon polyps Neg Hx    Esophageal cancer Neg Hx    Rectal cancer Neg Hx    Past Surgical History:  Procedure Laterality Date   ABDOMINAL HYSTERECTOMY  1998   BREAST SURGERY     breast bx on left, benign   CARDIAC CATHETERIZATION     CATARACT EXTRACTION, BILATERAL     CATARACT EXTRACTION, BILATERAL     CHOLECYSTECTOMY  1999   COLONOSCOPY      EYE SURGERY Bilateral 04/2017, 05/2017   cataracts   KNEE SURGERY  1958   POLYPECTOMY     SVT ABLATION N/A 06/03/2023   Procedure: SVT ABLATION;  Surgeon: Waddell Danelle ORN, MD;  Location: [facility];  Service: Cardiovascular;  Laterality: N/A;   TUBAL LIGATION     with D&C   Social History   Tobacco Use   Smoking status: Former    Current packs/day: 0.00    Types: Cigarettes    Quit date: 05/27/1973    Years since quitting: 50.8    Passive exposure: Past   Smokeless tobacco: Never  Vaping Use   Vaping status: Never Used  Substance Use  Topics   Alcohol use: No   Drug use: No   Social History   Social History Narrative   Not on file     Immunization History  Administered Date(s) Administered   INFLUENZA, HIGH DOSE SEASONAL PF 04/02/2013, 04/14/2014, 03/31/2015, 04/03/2017, 03/30/2018, 04/09/2019   Influenza Split 04/02/2011, 04/01/2012   Influenza Whole 06/28/2005, 03/25/2008, 03/29/2009, 03/07/2010   Influenza,inj,Quad PF,6+ Mos 03/19/2016   PFIZER(Purple Top)SARS-COV-2 Vaccination 07/11/2019, 08/03/2019   Pneumococcal Conjugate-13 09/24/2013   Pneumococcal Polysaccharide-23 05/01/2007   Td 10/13/2017   Tdap 05/27/2006   Zoster Recombinant(Shingrix) 11/05/2021, 01/08/2022, 02/17/2022, 02/24/2022   Zoster, Live 09/01/2007     Objective: Vital Signs: BP (!) 154/91   Pulse 72   Temp (!) 97.5 F (36.4 C)   Resp 13   Ht 5' 4 (1.626 m)   Wt 123 lb (55.8 kg)   BMI 21.11 kg/m    Physical Exam Vitals and nursing note reviewed.  HENT:     Head: Normocephalic and atraumatic.     Nose: Nose normal.  Eyes:     Conjunctiva/sclera: Conjunctivae normal.     Pupils: Pupils are equal, round, and reactive to light.  Pulmonary:     Effort: Pulmonary effort is normal. No respiratory distress.  Skin:    General: Skin is warm and dry.  Neurological:     Mental Status: She is alert. Mental status is at baseline.  Psychiatric:        Mood and Affect: Mood normal.         Behavior: Behavior normal.      Musculoskeletal Exam:   CDAI Exam: CDAI Score: -- Patient Global: --; Provider Global: -- Swollen: 0 ; Tender: 0  Joint Exam 03/17/2024   All documented joints were normal     Investigation: No additional findings.  Imaging: CUP PACEART REMOTE DEVICE CHECK Result Date: 03/10/2024 ILR summary report received. Battery status OK. Normal device function. No new symptom, tachy, brady, or pause episodes. No new AF episodes. Monthly summary reports and ROV/PRN LA, CVRS   Recent Labs: Lab Results  Component Value Date   WBC 7.0 01/08/2024   HGB 13.9 01/08/2024   PLT 252.0 01/08/2024   NA 140 01/08/2024   K 4.6 01/08/2024   CL 102 01/08/2024   CO2 31 01/08/2024   GLUCOSE 93 01/08/2024   BUN 17 01/08/2024   CREATININE 0.95 01/08/2024   BILITOT 0.5 01/08/2024   ALKPHOS 63 01/08/2024   AST 22 01/08/2024   ALT 21 01/08/2024   PROT 6.5 01/08/2024   ALBUMIN 4.2 01/08/2024   CALCIUM 8.9 01/08/2024   GFRAA 59 (L) 03/20/2018    Speciality Comments: No specialty comments available.  Procedures:  No procedures performed Allergies: Amoxicillin, Azithromycin, Ciprofloxacin, Epinephrine, Other, Penicillins, Sulfonamide derivatives, and Tamiflu  [oseltamivir  phosphate]   Assessment / Plan:     Visit Diagnoses:   Erosive (osteo)arthritis Patient presenting with bilateral hand pain. B/L x-rays showed gull wing deformities w/ central erosions on multiple DIP's and PIP's suggestive of a diagnosis of erosive OA. Discussed with patient current diagnosis. Patient does not wish to switch to Cymbalta as discussed at her prior appointment. Advised patient that this was fine, and she could always discuss switching with her primary care provider in the future if her symptoms became worse. Also discussed management options such as topical Voltaren gel BID PRN. Also discussed range of motion and dexterity exercises, passive stretching of hands/wrists. Also  recommended heat therapy with Paraffin wax or heated gloves. Patient verbalizes  understanding with above recommendations.   Orders: No orders of the defined types were placed in this encounter.  No orders of the defined types were placed in this encounter.  I personally spent a total of 20 minutes in the care of the patient today including preparing to see the patient, getting/reviewing separately obtained history, performing a medically appropriate exam/evaluation, counseling and educating, documenting clinical information in the EHR, and communicating results.   Follow-Up Instructions: Return if symptoms worsen or fail to improve.   Asberry Claw, DO  Note - This record has been created using Animal nutritionist.  Chart creation errors have been sought, but may not always  have been located. Such creation errors do not reflect on  the standard of medical care.

## 2024-03-04 NOTE — Progress Notes (Signed)
 Remote Loop Recorder Transmission

## 2024-03-09 ENCOUNTER — Ambulatory Visit

## 2024-03-09 DIAGNOSIS — R55 Syncope and collapse: Secondary | ICD-10-CM | POA: Diagnosis not present

## 2024-03-10 LAB — CUP PACEART REMOTE DEVICE CHECK
Date Time Interrogation Session: 20251013232314
Implantable Pulse Generator Implant Date: 20231208

## 2024-03-11 ENCOUNTER — Encounter

## 2024-03-11 ENCOUNTER — Ambulatory Visit: Payer: Self-pay | Admitting: Cardiology

## 2024-03-12 NOTE — Progress Notes (Signed)
 Remote Loop Recorder Transmission

## 2024-03-17 ENCOUNTER — Ambulatory Visit (INDEPENDENT_AMBULATORY_CARE_PROVIDER_SITE_OTHER): Admitting: Gastroenterology

## 2024-03-17 ENCOUNTER — Encounter: Payer: Self-pay | Admitting: Gastroenterology

## 2024-03-17 ENCOUNTER — Ambulatory Visit

## 2024-03-17 VITALS — BP 120/64 | HR 63 | Ht 64.0 in | Wt 122.5 lb

## 2024-03-17 VITALS — BP 154/91 | HR 72 | Temp 97.5°F | Resp 13 | Ht 64.0 in | Wt 123.0 lb

## 2024-03-17 DIAGNOSIS — K582 Mixed irritable bowel syndrome: Secondary | ICD-10-CM | POA: Diagnosis not present

## 2024-03-17 DIAGNOSIS — K861 Other chronic pancreatitis: Secondary | ICD-10-CM

## 2024-03-17 DIAGNOSIS — K8681 Exocrine pancreatic insufficiency: Secondary | ICD-10-CM | POA: Diagnosis not present

## 2024-03-17 DIAGNOSIS — M154 Erosive (osteo)arthritis: Secondary | ICD-10-CM | POA: Diagnosis not present

## 2024-03-17 DIAGNOSIS — R159 Full incontinence of feces: Secondary | ICD-10-CM | POA: Diagnosis not present

## 2024-03-17 DIAGNOSIS — M810 Age-related osteoporosis without current pathological fracture: Secondary | ICD-10-CM

## 2024-03-17 DIAGNOSIS — R7689 Other specified abnormal immunological findings in serum: Secondary | ICD-10-CM

## 2024-03-17 NOTE — Patient Instructions (Signed)
 You have been scheduled for a CT scan of the abdomen and pelvis at [facility], 1st floor Radiology. You are scheduled on 03/23/24 at 8:00am. You should arrive 15 minutes prior to your appointment time for registration.    You may take any medications as prescribed with a small amount of water, if necessary. If you take any of the following medications: METFORMIN, GLUCOPHAGE, GLUCOVANCE, AVANDAMET, RIOMET, FORTAMET, ACTOPLUS MET, JANUMET, GLUMETZA or METAGLIP, you MAY be asked to HOLD this medication 48 hours AFTER the exam.   The purpose of you drinking the oral contrast is to aid in the visualization of your intestinal tract. The contrast solution may cause some diarrhea. Depending on your individual set of symptoms, you may also receive an intravenous injection of x-ray contrast/dye. Plan on being at [facility] for 45 minutes or longer, depending on the type of exam you are having performed.   If you have any questions regarding your exam or if you need to reschedule, you may call [facility] at [phone] between the hours of 8:00 am and 5:00 pm, Monday-Friday.   _______________________________________________________  If your blood pressure at your visit was 140/90 or greater, please contact your primary care physician to follow up on this.  _______________________________________________________  If you are age 84 or older, your body mass index should be between 23-30. Your Body mass index is 21.03 kg/m. If this is out of the aforementioned range listed, please consider follow up with your Primary Care Provider.  If you are age 84 or younger, your body mass index should be between 19-25. Your Body mass index is 21.03 kg/m. If this is out of the aformentioned range listed, please consider follow up with your Primary Care Provider.   ________________________________________________________  The [facility] providers would like to encourage you to use MYCHART to  communicate with providers for non-urgent requests or questions.  Due to long hold times on the telephone, sending your provider a message by [facility] may be a faster and more efficient way to get a response.  Please allow 48 business hours for a response.  Please remember that this is for non-urgent requests.  _______________________________________________________  [facility] is using a team-based approach to care.  Your team is made up of your doctor and two to three APPS. Our APPS (Nurse Practitioners and Physician Assistants) work with your physician to ensure care continuity for you. They are fully qualified to address your health concerns and develop a treatment plan. They communicate directly with your gastroenterologist to care for you. Seeing the Advanced Practice Practitioners on your physician's team can help you by facilitating care more promptly, often allowing for earlier appointments, access to diagnostic testing, procedures, and other specialty referrals.

## 2024-03-17 NOTE — Progress Notes (Addendum)
 Chief Complaint: change in bowel habits Primary GI MD: Dr. Albertus  HPI: 83 year old female history of hypertension, SVT s/p ablation 05/2023, orthostasis with possible vagal component, autoimmune pancreatitis, presents for evaluation of change in bowel habits        06/04/2022 patient seen in clinic by Dr. Albertus. At that time was doing fairly well but had an episode of syncope while driving and having episodes of what sounded like SVT. She had not been driving since that time. From a GI perspective things are stable using Creon  at previous dose with every meal. Still some inconsistency with loose stools at times. Use Colestipol  1 g at bedtime. Had tried 2 g but felt constipated. Also used Lomotil  1-2 tabs as needed but not daily. At that time continued Creon  6 capsules with meals 1-3 with snacks. Also a multivitamin. Continued Colestipol  1 to 2 g nightly. Told to minimize Lomotil  but have it available if needed. Discussed history of fatty liver and recommended continued vitamin EE 800 IU daily. Also continue Pantoprazole  40 mg for GERD. Discussed surveillance colonoscopy the year before with no repeat recommended due to age.      05/2023 seen by Delon Failing PA-C and was managing well on creon  6 capsules with meals and 1-3 with snacks.  She was concerned about her friends with colon polyps and cancer and wanted a colonoscopy and this was not advised that she had no symptoms and she was of high risk due to age and comorbidities  Discussed the use of AI scribe software for clinical note transcription with the patient, who gave verbal consent to proceed.  She has experienced a recent change in bowel habits, characterized by narrow, pencil-like stools and ribbon-type stools. She alternates between constipation and diarrhea, with constipation episodes lasting up to three days without a bowel movement. She has seen blood in her stool on two occasions, described as blood on wiping and in mucus. Episodes of  explosive diarrhea sometimes lead to incontinence, requiring her to keep spare clothes in her car.  She has a history of autoimmune pancreatitis and has been on Creon . She previously used colestipol  for loose stools but discontinued it due to difficulty swallowing the large pills. She now uses Lomotil  as needed, particularly before eating out, to manage diarrhea. She has a history of polyps found during colonoscopies, with some being precancerous, but she has not had a clean colonoscopy. Her last colonoscopy was in 2023.  She has a history of low blood pressure and syncope, leading to a mechanical fall and a vertebra fracture. A loop recorder is implanted to monitor her heart, which is still functioning. She has experienced episodes of high heart rates, leading to a heart ablation in January. She continues to experience heart flutters.  She avoids foods like pears, apples, broccoli, and green beans, as they exacerbate her symptoms, and has discussed the FODMAP diet with her doctor. She has a history of bad hemorrhoids, which were painful but have not been recently problematic.  She is concerned about pancreatic cancer due to family history and the death of a friend's husband from the disease.   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HCC)    Cataract    bilateral and removed   CHEST PAIN    LHC 02/2011: normal cors, EF 65%   Contact dermatitis 10/13/2017  Diverticulosis    Esophageal reflux    Fatty liver    Fibrocystic breast    Hand injury, left, initial encounter 10/13/2017   Hepatic cyst    Hepatic steatosis    History of shingles    HYPOTENSION, ORTHOSTATIC    IBS (irritable bowel syndrome)    Internal hemorrhoids without mention of complication    Kidney lesion    Kidney stone    Liver cyst    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   Osteopenia    Osteopenia     Osteoporosis    Pain in ear    PALPITATIONS, RECURRENT    Pancreatitis chronic    Preventative health care    PSVT    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   SHOULDER PAIN    Syncope and collapse    broke a rib, and a vertabre   TOBACCO USE, QUIT    VITAMIN D  DEFICIENCY     Past Surgical History:  Procedure Laterality Date   ABDOMINAL HYSTERECTOMY  1998   BREAST SURGERY     breast bx on left, benign   CARDIAC CATHETERIZATION     CATARACT EXTRACTION, BILATERAL     CATARACT EXTRACTION, BILATERAL     CHOLECYSTECTOMY  1999   COLONOSCOPY     EYE SURGERY Bilateral 04/2017, 05/2017   cataracts   KNEE SURGERY  1958   POLYPECTOMY     SVT ABLATION N/A 06/03/2023   Procedure: SVT ABLATION;  Surgeon: Waddell Danelle ORN, MD;  Location: [facility];  Service: Cardiovascular;  Laterality: N/A;   TUBAL LIGATION     with D&C    Current Outpatient Medications  Medication Sig Dispense Refill   amLODipine  (NORVASC ) 2.5 MG tablet Take 1 tablet by mouth once daily 90 tablet 3   Ascorbic Acid (VITAMIN C) 500 MG tablet Take 500 mg by mouth in the morning.     Azelaic Acid 15 % gel Apply 1 application topically at bedtime.     B Complex-C (B-COMPLEX WITH VITAMIN C) tablet Take 1 tablet by mouth in the morning.     cetirizine  (ZYRTEC ) 10 MG tablet Take 1 tablet (10 mg total) by mouth daily. 90 tablet 3   cholecalciferol (VITAMIN D3) 25 MCG (1000 UNIT) tablet Take 1,000 Units by mouth in the morning.     CRANBERRY PO Take 1 capsule by mouth in the morning.     diphenoxylate -atropine  (LOMOTIL ) 2.5-0.025 MG tablet TAKE 1 TABLET BY MOUTH 4 TIMES DAILY AS NEEDED FOR DIARRHEA OR  LOOSE  STOOLS. 360 tablet 0   escitalopram  (LEXAPRO ) 10 MG tablet TAKE 1 TABLET BY MOUTH DAILY 90 tablet 3   estradiol  (DOTTI ) 0.025 MG/24HR Place 1 patch onto the skin 2 (two) times a week. 24 patch 1   fluticasone  (FLONASE ) 50 MCG/ACT nasal spray USE 2 SPRAY(S) IN EACH NOSTRIL AS NEEDED FOR ALLERGIES OR  RHINITIS 48 g 1    Multiple Vitamin (MULTIVITAMIN WITH MINERALS) TABS tablet Take 1 tablet by mouth every other day. Alternating days with the calcium supplement     Pancrelipase , Lip-Prot-Amyl, (CREON ) 24000-76000 units CPEP TAKE 6 CAPSULES BY MOUTH WITH  MEALS AND 1 TO 3 CAPSULES WITH  EACH SNACK. IF EATING HIGH FAT  ITEMS TAKE 8 CAPSULES WITH MEALS . MAX 30 CAPSULES DAILY 2400 capsule 3   pantoprazole  (PROTONIX ) 40 MG tablet TAKE 1 TABLET BY MOUTH DAILY 90 tablet 3   Probiotic Product (PROBIOTIC PO) Take 1 capsule by mouth in  the morning. Probiotic Gummy     vitamin E  180 MG (400 UNITS) capsule Take 1 capsule (400 Units total) by mouth in the morning. 90 capsule 3   Calcium Citrate-Vitamin D  (CALCIUM CITRATE + D3 PO) Take 1 tablet by mouth every other day. Alternating days with the multivitamin. (Patient not taking: Reported on 03/17/2024)     colestipol  (COLESTID ) 1 g tablet Take 2 tablets (2 g total) by mouth at bedtime. (Patient not taking: Reported on 03/17/2024) 180 tablet 3   Melatonin 5 MG CHEW Chew 5 mg by mouth at bedtime. (Patient not taking: Reported on 03/17/2024)     tolnaftate (TINACTIN) 1 % external solution Apply 1 Application topically at bedtime. (Patient not taking: Reported on 03/17/2024)     triamcinolone  cream (KENALOG ) 0.1 % Apply topically 2 (two) times daily. (Patient not taking: Reported on 03/17/2024)     No current facility-administered medications for this visit.    Allergies as of 03/17/2024 - Review Complete 03/17/2024  Allergen Reaction Noted   Amoxicillin Other (See Comments) 01/06/2007   Azithromycin  10/03/2009   Ciprofloxacin  05/01/2007   Epinephrine  01/06/2007   Other Other (See Comments) 02/10/2024   Penicillins  10/03/2009   Sulfonamide derivatives  02/27/2009   Tamiflu  [oseltamivir  phosphate] Hives 09/12/2016    Family History  Problem Relation Age of Onset   Hypertension Mother    Stroke Mother    Heart disease Father    Bladder Cancer Father    Diabetes  Father    Hypothyroidism Father    Dementia Father    Heart disease Sister        rheumatic fever at 6 months, cardiomegaly MI   Pancreatic cancer Paternal Aunt    Breast cancer Paternal Aunt    Stomach cancer Paternal Uncle    Lung cancer Paternal Uncle    Heart disease Maternal Grandmother    Stroke Maternal Grandmother    Heart disease Maternal Grandfather    Stroke Maternal Grandfather    Uterine cancer Paternal Grandmother    Crohn's disease Son    Breast cancer Cousin    Colon cancer Neg Hx    Colon polyps Neg Hx    Esophageal cancer Neg Hx    Rectal cancer Neg Hx     Social History   Socioeconomic History   Marital status: Married    Spouse name: Not on file   Number of children: 1   Years of education: Not on file   Highest education level: Bachelor's degree (e.g., BA, AB, BS)  Occupational History   Occupation: Retired    Associate Professor: RETIRED  Tobacco Use   Smoking status: Former    Current packs/day: 0.00    Types: Cigarettes    Quit date: 05/27/1973    Years since quitting: 50.8    Passive exposure: Past   Smokeless tobacco: Never  Vaping Use   Vaping status: Never Used  Substance and Sexual Activity   Alcohol use: No   Drug use: No   Sexual activity: Not on file  Other Topics Concern   Not on file  Social History Narrative   Not on file   Social Drivers of Health   Financial Resource Strain: Low Risk  (01/07/2024)   Overall Financial Resource Strain (CARDIA)    Difficulty of Paying Living Expenses: Not hard at all  Food Insecurity: No Food Insecurity (01/07/2024)   Hunger Vital Sign    Worried About Running Out of Food in the Last Year:  Never true    Ran Out of Food in the Last Year: Never true  Transportation Needs: No Transportation Needs (01/07/2024)   PRAPARE - Administrator, Civil Service (Medical): No    Lack of Transportation (Non-Medical): No  Physical Activity: Sufficiently Active (01/07/2024)   Exercise Vital Sign    Days of  Exercise per Week: 3 days    Minutes of Exercise per Session: 80 min  Stress: Stress Concern Present (01/07/2024)   [employer] of Occupational Health - Occupational Stress Questionnaire    Feeling of Stress: To some extent  Social Connections: Socially Integrated (01/07/2024)   Social Connection and Isolation Panel    Frequency of Communication with Friends and Family: Three times a week    Frequency of Social Gatherings with Friends and Family: More than three times a week    Attends Religious Services: More than 4 times per year    Active Member of [employer] or Organizations: Yes    Attends Engineer, structural: More than 4 times per year    Marital Status: Married  Catering manager Violence: Not At Risk (05/15/2023)   Humiliation, Afraid, Rape, and Kick questionnaire    Fear of Current or Ex-Partner: No    Emotionally Abused: No    Physically Abused: No    Sexually Abused: No    Review of Systems:    Constitutional: No weight loss, fever, chills, weakness or fatigue HEENT: Eyes: No change in vision               Ears, Nose, Throat:  No change in hearing or congestion Skin: No rash or itching Cardiovascular: No chest pain, chest pressure or palpitations   Respiratory: No SOB or cough Gastrointestinal: See HPI and otherwise negative Genitourinary: No dysuria or change in urinary frequency Neurological: No headache, dizziness or syncope Musculoskeletal: No new muscle or joint pain Hematologic: No bleeding or bruising Psychiatric: No history of depression or anxiety    Physical Exam:  Vital signs: BP 120/64   Pulse 63   Ht 5' 4 (1.626 m)   Wt 122 lb 8 oz (55.6 kg)   BMI 21.03 kg/m   Constitutional: NAD, alert and cooperative.  Appears significantly younger than stated age Head:  Normocephalic and atraumatic. Eyes:   PEERL, EOMI. No icterus. Conjunctiva pink. Respiratory: Respirations even and unlabored. Lungs clear to auscultation bilaterally.   No wheezes,  crackles, or rhonchi.  Cardiovascular:  Regular rate and rhythm. No peripheral edema, cyanosis or pallor.  Gastrointestinal:  Soft, nondistended, nontender. No rebound or guarding. Normal bowel sounds. No appreciable masses or hepatomegaly. Rectal:  gloria CMA chaperone. No external hemorrhoids, decreased sphincter tone, no obvious internal hemorrhoids, no stool burden Msk:  Symmetrical without gross deformities. Without edema, no deformity or joint abnormality.  Neurologic:  Alert and  oriented x4;  grossly normal neurologically.  Skin:   Dry and intact without significant lesions or rashes. Psychiatric: Oriented to person, place and time. Demonstrates good judgement and reason without abnormal affect or behaviors.  RELEVANT LABS AND IMAGING: CBC    Component Value Date/Time   WBC 7.0 01/08/2024 1250   RBC 4.60 01/08/2024 1250   HGB 13.9 01/08/2024 1250   HGB 13.4 05/13/2023 1052   HCT 41.8 01/08/2024 1250   HCT 40.3 05/13/2023 1052   PLT 252.0 01/08/2024 1250   PLT 263 05/13/2023 1052   MCV 90.9 01/08/2024 1250   MCV 92 05/13/2023 1052   MCH 30.5 05/13/2023 1052  MCH 30.2 03/20/2018 1403   MCHC 33.2 01/08/2024 1250   RDW 14.7 01/08/2024 1250   RDW 13.8 05/13/2023 1052   LYMPHSABS 1.4 01/08/2024 1250   MONOABS 0.5 01/08/2024 1250   EOSABS 0.1 01/08/2024 1250   BASOSABS 0.0 01/08/2024 1250    CMP     Component Value Date/Time   NA 140 01/08/2024 1250   NA 144 05/13/2023 1052   K 4.6 01/08/2024 1250   CL 102 01/08/2024 1250   CO2 31 01/08/2024 1250   GLUCOSE 93 01/08/2024 1250   BUN 17 01/08/2024 1250   BUN 16 05/13/2023 1052   CREATININE 0.95 01/08/2024 1250   CREATININE 0.92 03/26/2013 0931   CALCIUM 8.9 01/08/2024 1250   PROT 6.5 01/08/2024 1250   ALBUMIN 4.2 01/08/2024 1250   AST 22 01/08/2024 1250   ALT 21 01/08/2024 1250   ALKPHOS 63 01/08/2024 1250   BILITOT 0.5 01/08/2024 1250   GFRNONAA 51 (L) 03/20/2018 1403   GFRAA 59 (L) 03/20/2018 1403      Assessment/Plan:   83 year old female history of hypertension, 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  Over the past few years, maintaining on Creon  6 capsules with meals 1-3 with snacks.  MRI 2022 stable - Continue Creon   IBS No longer on colestipol .  Uses Lomotil  as needed.  Intermittent changes between explosive diarrhea with fecal incontinence and now having constipation with associated occasional rectal bleeding.  Patient would really like colonoscopy.  She is high risk due to comorbidities and age and had a reassuring recent colonoscopy in 2023.  Suspect pelvic floor component to leading to difficulty with bowel movements.  Decreased rectal tone on rectal exam otherwise unrevealing. 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

## 2024-03-23 ENCOUNTER — Ambulatory Visit (HOSPITAL_COMMUNITY)
Admission: RE | Admit: 2024-03-23 | Discharge: 2024-03-23 | Disposition: A | Discharge status: Home / Self Care | Source: Ambulatory Visit | Attending: Gastroenterology | Admitting: Gastroenterology

## 2024-03-23 DIAGNOSIS — K861 Other chronic pancreatitis: Secondary | ICD-10-CM | POA: Insufficient documentation

## 2024-03-23 MED ORDER — SODIUM CHLORIDE (PF) 0.9 % IJ SOLN
INTRAMUSCULAR | Status: AC
  Filled 2024-03-23: qty 50

## 2024-03-23 MED ORDER — IOHEXOL 300 MG/ML  SOLN
100.0000 mL | Freq: Once | INTRAMUSCULAR | Status: AC | PRN
  Administered 2024-03-23: 80 mL via INTRAVENOUS

## 2024-03-26 ENCOUNTER — Ambulatory Visit: Payer: Self-pay | Admitting: Gastroenterology

## 2024-04-09 ENCOUNTER — Ambulatory Visit (INDEPENDENT_AMBULATORY_CARE_PROVIDER_SITE_OTHER)

## 2024-04-09 DIAGNOSIS — I471 Supraventricular tachycardia, unspecified: Secondary | ICD-10-CM | POA: Diagnosis not present

## 2024-04-11 LAB — CUP PACEART REMOTE DEVICE CHECK
Date Time Interrogation Session: 20251113231736
Implantable Pulse Generator Implant Date: 20231208

## 2024-04-13 ENCOUNTER — Ambulatory Visit: Payer: Self-pay | Admitting: Cardiology

## 2024-04-13 NOTE — Progress Notes (Signed)
 Remote Loop Recorder Transmission

## 2024-04-21 ENCOUNTER — Other Ambulatory Visit: Payer: Self-pay | Admitting: Internal Medicine

## 2024-04-28 ENCOUNTER — Other Ambulatory Visit: Payer: Self-pay | Admitting: Internal Medicine

## 2024-05-04 ENCOUNTER — Ambulatory Visit: Admitting: Gastroenterology

## 2024-05-10 ENCOUNTER — Ambulatory Visit

## 2024-05-10 DIAGNOSIS — I471 Supraventricular tachycardia, unspecified: Secondary | ICD-10-CM | POA: Diagnosis not present

## 2024-05-11 LAB — CUP PACEART REMOTE DEVICE CHECK
Date Time Interrogation Session: 20251214232439
Implantable Pulse Generator Implant Date: 20231208

## 2024-05-13 ENCOUNTER — Ambulatory Visit: Payer: Self-pay | Admitting: Cardiology

## 2024-05-14 NOTE — Progress Notes (Signed)
 Remote Loop Recorder Transmission

## 2024-06-10 ENCOUNTER — Ambulatory Visit

## 2024-06-10 DIAGNOSIS — I471 Supraventricular tachycardia, unspecified: Secondary | ICD-10-CM

## 2024-06-10 LAB — CUP PACEART REMOTE DEVICE CHECK
Date Time Interrogation Session: 20260114232645
Implantable Pulse Generator Implant Date: 20231208

## 2024-06-13 ENCOUNTER — Ambulatory Visit: Payer: Self-pay | Admitting: Cardiology

## 2024-06-14 ENCOUNTER — Ambulatory Visit: Admitting: Gastroenterology

## 2024-06-14 ENCOUNTER — Encounter: Payer: Self-pay | Admitting: Gastroenterology

## 2024-06-14 VITALS — BP 130/84 | HR 75 | Ht 64.0 in | Wt 123.6 lb

## 2024-06-14 DIAGNOSIS — K589 Irritable bowel syndrome without diarrhea: Secondary | ICD-10-CM

## 2024-06-14 DIAGNOSIS — K760 Fatty (change of) liver, not elsewhere classified: Secondary | ICD-10-CM | POA: Diagnosis not present

## 2024-06-14 DIAGNOSIS — K8681 Exocrine pancreatic insufficiency: Secondary | ICD-10-CM

## 2024-06-14 DIAGNOSIS — K582 Mixed irritable bowel syndrome: Secondary | ICD-10-CM

## 2024-06-14 DIAGNOSIS — K219 Gastro-esophageal reflux disease without esophagitis: Secondary | ICD-10-CM

## 2024-06-14 DIAGNOSIS — R159 Full incontinence of feces: Secondary | ICD-10-CM

## 2024-06-14 DIAGNOSIS — K861 Other chronic pancreatitis: Secondary | ICD-10-CM

## 2024-06-14 DIAGNOSIS — Z860100 Personal history of colon polyps, unspecified: Secondary | ICD-10-CM

## 2024-06-14 DIAGNOSIS — R194 Change in bowel habit: Secondary | ICD-10-CM

## 2024-06-14 NOTE — Patient Instructions (Addendum)
 VISIT SUMMARY:  During your visit, we discussed your chronic bowel dysfunction, including alternating diarrhea, constipation, and fecal incontinence. We reviewed your current symptoms, dietary habits, and previous treatments.  YOUR PLAN:  -FUNCTIONAL BOWEL DISORDER WITH DIARRHEA, CONSTIPATION, AND INCONTINENCE: This condition involves irregular bowel movements, including episodes of diarrhea, constipation, and fecal incontinence. We will continue fiber supplementation and adjust the dose as needed. You will start taking polyethylene glycol (Miralax) at 1/2 cap every other day for two weeks. Using a footstool while on the toilet can help improve bowel movements. Regular exercise, including squats, and staying hydrated are important for managing your symptoms.  INSTRUCTIONS:  Please update your symptoms via MyChart by Valentine's Day. We will have a follow-up appointment in 8-12 weeks, but contact us  sooner if your symptoms worsen or do not improve.  _______________________________________________________  If your blood pressure at your visit was 140/90 or greater, please contact your primary care physician to follow up on this.  _______________________________________________________  If you are age 77 or older, your body mass index should be between 23-30. Your Body mass index is 21.22 kg/m. If this is out of the aforementioned range listed, please consider follow up with your Primary Care Provider.  If you are age 42 or younger, your body mass index should be between 19-25. Your Body mass index is 21.22 kg/m. If this is out of the aformentioned range listed, please consider follow up with your Primary Care Provider.   ________________________________________________________  The [facility] providers would like to encourage you to use MYCHART to communicate with providers for non-urgent requests or questions.  Due to long hold times on the telephone, sending your provider a message by  [facility] may be a faster and more efficient way to get a response.  Please allow 48 business hours for a response.  Please remember that this is for non-urgent requests.  _______________________________________________________  [facility] is using a team-based approach to care.  Your team is made up of your doctor and two to three APPS. Our APPS (Nurse Practitioners and Physician Assistants) work with your physician to ensure care continuity for you. They are fully qualified to address your health concerns and develop a treatment plan. They communicate directly with your gastroenterologist to care for you. Seeing the Advanced Practice Practitioners on your physician's team can help you by facilitating care more promptly, often allowing for earlier appointments, access to diagnostic testing, procedures, and other specialty referrals.

## 2024-06-14 NOTE — Progress Notes (Signed)
 "  Chief Complaint: follow up Primary GI MD: Dr. Albertus  HPI: 84 year old female history of hypertension, SVT s/p ablation 05/2023, orthostasis with possible vagal component, autoimmune pancreatitis   06/04/2022 patient seen in clinic by Dr. Albertus. At that time was doing fairly well but had an episode of syncope while driving and having episodes of what sounded like SVT. She had not been driving since that time. From a GI perspective things are stable using Creon  at previous dose with every meal. Still some inconsistency with loose stools at times. Use Colestipol  1 g at bedtime. Had tried 2 g but felt constipated. Also used Lomotil  1-2 tabs as needed but not daily. At that time continued Creon  6 capsules with meals 1-3 with snacks. Also a multivitamin. Continued Colestipol  1 to 2 g nightly. Told to minimize Lomotil  but have it available if needed. Discussed history of fatty liver and recommended continued vitamin EE 800 IU daily. Also continue Pantoprazole  40 mg for GERD. Discussed surveillance colonoscopy the year before with no repeat recommended due to age.      05/2023 seen by Delon Failing PA-C and was managing well on creon  6 capsules with meals and 1-3 with snacks.  She was concerned about her friends with colon polyps and cancer and wanted a colonoscopy and this was not advised that she had no symptoms and she was of high risk due to age and comorbidities     02/2024: continuing to have intermittent bowel changes and fecal incontinence. Recommended fiber and obtained CTAP which was unrevealing   Discussed the use of AI scribe software for clinical note transcription with the patient, who gave verbal consent to proceed.  History of Present Illness  [name] is an 84 year old female with chronic bowel dysfunction who presents for follow-up of alternating diarrhea, constipation, and fecal incontinence.  She has longstanding alternating bowel habits, including episodes of explosive  diarrhea, fecal incontinence, and constipation. Frequent urgency and inability to reach the bathroom in time have resulted in soiling of clothes, necessitating carrying a change of clothes and a washcloth. She wears a pad daily and sometimes uses tissue paper to manage leakage. Adult diapers were previously used but found to be awkward. Symptoms have persisted for years, with alternating episodes of diarrhea and constipation.  Benefiber was previously prescribed three times daily, initially improving bowel regularity and sleep. Over time, efficacy decreased, with recurrence of diarrhea, leading to dose reduction and subsequent constipation. She has since tried a [employer] brand fiber supplement (two chewables per meal), but adherence is inconsistent. A FODMAP diet is followed, though she occasionally eats foods outside this plan, such as broccoli, to relieve constipation. A recent episode of loose stools and incontinence occurred after eating a sausage McMuffin. She has improvised with boxes to simulate a squatty potty but does not use one at home. Pelvic floor physical therapy has not been attended and she is not interested. She takes a daily probiotic. Lomotil  was previously used for diarrhea, sometimes up to four tablets at once, but she is not currently using any antidiarrheals. Miralax has not been used recently. Squatting exercises and certain toilet positions can sometimes facilitate bowel movements. Bowel symptoms tend to worsen during the winter and holidays, possibly related to changes in activity, diet, and increased stress.  She has difficulty sleeping and has tried melatonin, but is concerned about its safety due to mild kidney and liver disease. Vitamin E  is taken for her liver. She is unsure about which  melatonin formulation to use. Water is consumed regularly, especially in the mornings and [facility], but she finds it difficult to drink cold water during the winter. Diet Coke is  occasionally consumed with meals. She reports increased stress related to a family member's memory problems and recent medical testing.   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HCC)    Cataract    bilateral and removed   CHEST PAIN    LHC 02/2011: normal cors, EF 65%   Contact dermatitis 10/13/2017   Diverticulosis    Esophageal reflux    Fatty liver    Fibrocystic breast    Hand injury, left, initial encounter 10/13/2017   Hepatic cyst    Hepatic steatosis    History of shingles    HYPOTENSION, ORTHOSTATIC    IBS (irritable bowel syndrome)    Internal hemorrhoids without mention of complication    Kidney lesion    Kidney stone    Liver cyst    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   Osteopenia    Osteopenia    Osteoporosis    Pain in ear    PALPITATIONS, RECURRENT    Pancreatitis chronic    Preventative health care    PSVT    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   SHOULDER PAIN    Syncope and collapse    broke a rib, and a vertabre   TOBACCO USE, QUIT    VITAMIN D  DEFICIENCY     Past Surgical History:  Procedure Laterality Date   ABDOMINAL HYSTERECTOMY  1998   BREAST SURGERY     breast bx on left, benign   CARDIAC CATHETERIZATION     CATARACT EXTRACTION, BILATERAL     CATARACT EXTRACTION, BILATERAL     CHOLECYSTECTOMY  1999   COLONOSCOPY     EYE SURGERY Bilateral 04/2017, 05/2017   cataracts   KNEE SURGERY  1958   POLYPECTOMY     SVT ABLATION N/A 06/03/2023   Procedure: SVT ABLATION;  Surgeon: Waddell Danelle ORN, MD;  Location: [facility];  Service: Cardiovascular;  Laterality: N/A;   TUBAL LIGATION     with D&C    Current Outpatient Medications  Medication Sig Dispense Refill   amLODipine  (NORVASC ) 2.5 MG tablet Take 1 tablet by mouth once daily 90 tablet 3   Ascorbic Acid (VITAMIN C) 500 MG tablet Take 500 mg by mouth in the morning.      Azelaic Acid 15 % gel Apply 1 application topically at bedtime.     B Complex-C (B-COMPLEX WITH VITAMIN C) tablet Take 1 tablet by mouth in the morning.     Calcium Citrate-Vitamin D  (CALCIUM CITRATE + D3 PO) Take 1 tablet by mouth every other day. Alternating days with the multivitamin.     cetirizine  (ZYRTEC ) 10 MG tablet Take 1 tablet (10 mg total) by mouth daily. 90 tablet 3   cholecalciferol (VITAMIN D3) 25 MCG (1000 UNIT) tablet Take 1,000 Units by mouth in the morning.     CRANBERRY PO Take 1 capsule by mouth in the morning.     diphenoxylate -atropine  (LOMOTIL ) 2.5-0.025 MG tablet TAKE 1 TABLET BY MOUTH 4 TIMES DAILY AS NEEDED FOR DIARRHEA OR  LOOSE  STOOLS. 360 tablet 0   escitalopram  (LEXAPRO ) 10 MG tablet TAKE 1 TABLET BY MOUTH DAILY 90 tablet 3  estradiol  (DOTTI ) 0.025 MG/24HR Place 1 patch onto the skin 2 (two) times a week. 24 patch 1   fluticasone  (FLONASE ) 50 MCG/ACT nasal spray USE 2 SPRAY(S) IN EACH NOSTRIL AS NEEDED FOR ALLERGIES OR  RHINITIS 48 g 1   Melatonin 5 MG CHEW Chew 5 mg by mouth at bedtime.     Multiple Vitamin (MULTIVITAMIN WITH MINERALS) TABS tablet Take 1 tablet by mouth every other day. Alternating days with the calcium supplement     Pancrelipase , Lip-Prot-Amyl, (CREON ) 24000-76000 units CPEP TAKE 6 CAPSULES BY MOUTH WITH  MEALS AND 1 TO 3 CAPSULES WITH  EACH SNACK. IF EATING HIGH FAT  ITEMS TAKE 8 CAPSULES WITH  MEALS. MAX 30 CAPSULES DAILY. 2700 capsule 3   pantoprazole  (PROTONIX ) 40 MG tablet TAKE 1 TABLET BY MOUTH DAILY 90 tablet 3   Probiotic Product (PROBIOTIC PO) Take 1 capsule by mouth in the morning. Probiotic Gummy     tolnaftate (TINACTIN) 1 % external solution Apply 1 Application topically at bedtime.     triamcinolone  cream (KENALOG ) 0.1 % Apply topically 2 (two) times daily.     vitamin E  180 MG (400 UNITS) capsule Take 1 capsule (400 Units total) by mouth in the morning. 90 capsule 3   No current facility-administered medications for this visit.     Allergies as of 06/14/2024 - Review Complete 06/14/2024  Allergen Reaction Noted   Amoxicillin Other (See Comments) 01/06/2007   Azithromycin  10/03/2009   Ciprofloxacin  05/01/2007   Epinephrine  01/06/2007   Other Other (See Comments) 02/10/2024   Penicillins  10/03/2009   Sulfonamide derivatives  02/27/2009   Tamiflu  [oseltamivir  phosphate] Hives 09/12/2016    Family History  Problem Relation Age of Onset   Hypertension Mother    Stroke Mother    Heart disease Father    Bladder Cancer Father    Diabetes Father    Hypothyroidism Father    Dementia Father    Heart disease Sister        rheumatic fever at 6 months, cardiomegaly MI   Pancreatic cancer Paternal Aunt    Breast cancer Paternal Aunt    Stomach cancer Paternal Uncle    Lung cancer Paternal Uncle    Heart disease Maternal Grandmother    Stroke Maternal Grandmother    Heart disease Maternal Grandfather    Stroke Maternal Grandfather    Uterine cancer Paternal Grandmother    Crohn's disease Son    Breast cancer Cousin    Colon cancer Neg Hx    Colon polyps Neg Hx    Esophageal cancer Neg Hx    Rectal cancer Neg Hx     Social History   Socioeconomic History   Marital status: Married    Spouse name: Not on file   Number of children: 1   Years of education: Not on file   Highest education level: Bachelor's degree (e.g., BA, AB, BS)  Occupational History   Occupation: Retired    Associate Professor: RETIRED  Tobacco Use   Smoking status: Former    Current packs/day: 0.00    Types: Cigarettes    Quit date: 05/27/1973    Years since quitting: 51.0    Passive exposure: Past   Smokeless tobacco: Never  Vaping Use   Vaping status: Never Used  Substance and Sexual Activity   Alcohol use: No   Drug use: No   Sexual activity: Not on file  Other Topics Concern   Not on file  Social History Narrative  Not on file   Social Drivers of Health   Tobacco Use: Medium Risk (06/14/2024)   Patient History     Smoking Tobacco Use: Former    Smokeless Tobacco Use: Never    Passive Exposure: Past  Physicist, Medical Strain: Low Risk (01/07/2024)   Overall Financial Resource Strain (CARDIA)    Difficulty of Paying Living Expenses: Not hard at all  Food Insecurity: No Food Insecurity (01/07/2024)   Epic    Worried About Programme Researcher, Broadcasting/film/video in the Last Year: Never true    Ran Out of Food in the Last Year: Never true  Transportation Needs: No Transportation Needs (01/07/2024)   Epic    Lack of Transportation (Medical): No    Lack of Transportation (Non-Medical): No  Physical Activity: Sufficiently Active (01/07/2024)   Exercise Vital Sign    Days of Exercise per Week: 3 days    Minutes of Exercise per Session: 80 min  Stress: Stress Concern Present (01/07/2024)   [employer] of Occupational Health - Occupational Stress Questionnaire    Feeling of Stress: To some extent  Social Connections: Socially Integrated (01/07/2024)   Social Connection and Isolation Panel    Frequency of Communication with Friends and Family: Three times a week    Frequency of Social Gatherings with Friends and Family: More than three times a week    Attends Religious Services: More than 4 times per year    Active Member of [employer] or Organizations: Yes    Attends Banker Meetings: More than 4 times per year    Marital Status: Married  Catering Manager Violence: Not At Risk (05/15/2023)   Humiliation, Afraid, Rape, and Kick questionnaire    Fear of Current or Ex-Partner: No    Emotionally Abused: No    Physically Abused: No    Sexually Abused: No  Depression (PHQ2-9): Low Risk (01/08/2024)   Depression (PHQ2-9)    PHQ-2 Score: 0  Alcohol Screen: Low Risk (05/15/2023)   Alcohol Screen    Last Alcohol Screening Score (AUDIT): 0  Housing: Unknown (01/07/2024)   Epic    Unable to Pay for Housing in the Last Year: No    Number of Times Moved in the Last Year: Not on file    Homeless in the Last Year: No   Utilities: Not At Risk (05/15/2023)   [facility] Utilities    Threatened with loss of utilities: No  Health Literacy: Adequate Health Literacy (05/15/2023)   B1300 Health Literacy    Frequency of need for help with medical instructions: Never    Review of Systems:    Constitutional: No weight loss, fever, chills, weakness or fatigue HEENT: Eyes: No change in vision               Ears, Nose, Throat:  No change in hearing or congestion Skin: No rash or itching Cardiovascular: No chest pain, chest pressure or palpitations   Respiratory: No SOB or cough Gastrointestinal: See HPI and otherwise negative Genitourinary: No dysuria or change in urinary frequency Neurological: No headache, dizziness or syncope Musculoskeletal: No new muscle or joint pain Hematologic: No bleeding or bruising Psychiatric: No history of depression or anxiety    Physical Exam:  Vital signs: BP 130/84   Pulse 75   Ht 5' 4 (1.626 m)   Wt 123 lb 9.6 oz (56.1 kg)   BMI 21.22 kg/m   Constitutional: NAD, alert and cooperative Head:  Normocephalic and atraumatic. Eyes:   PEERL, EOMI. No icterus.  Conjunctiva pink. Respiratory: Respirations even and unlabored. Lungs clear to auscultation bilaterally.   No wheezes, crackles, or rhonchi.  Cardiovascular:  Regular rate and rhythm. No peripheral edema, cyanosis or pallor.  Gastrointestinal:  Soft, nondistended, nontender. No rebound or guarding. Normal bowel sounds. No appreciable masses or hepatomegaly. Rectal:  Declines Msk:  Symmetrical without gross deformities. Without edema, no deformity or joint abnormality.  Neurologic:  Alert and  oriented x4;  grossly normal neurologically.  Skin:   Dry and intact without significant lesions or rashes. Psychiatric: Oriented to person, place and time. Demonstrates good judgement and reason without abnormal affect or behaviors.  Physical Exam    RELEVANT LABS AND IMAGING: CBC    Component Value Date/Time   WBC 7.0  01/08/2024 1250   RBC 4.60 01/08/2024 1250   HGB 13.9 01/08/2024 1250   HGB 13.4 05/13/2023 1052   HCT 41.8 01/08/2024 1250   HCT 40.3 05/13/2023 1052   PLT 252.0 01/08/2024 1250   PLT 263 05/13/2023 1052   MCV 90.9 01/08/2024 1250   MCV 92 05/13/2023 1052   MCH 30.5 05/13/2023 1052   MCH 30.2 03/20/2018 1403   MCHC 33.2 01/08/2024 1250   RDW 14.7 01/08/2024 1250   RDW 13.8 05/13/2023 1052   LYMPHSABS 1.4 01/08/2024 1250   MONOABS 0.5 01/08/2024 1250   EOSABS 0.1 01/08/2024 1250   BASOSABS 0.0 01/08/2024 1250    CMP     Component Value Date/Time   NA 140 01/08/2024 1250   NA 144 05/13/2023 1052   K 4.6 01/08/2024 1250   CL 102 01/08/2024 1250   CO2 31 01/08/2024 1250   GLUCOSE 93 01/08/2024 1250   BUN 17 01/08/2024 1250   BUN 16 05/13/2023 1052   CREATININE 0.95 01/08/2024 1250   CREATININE 0.92 03/26/2013 0931   CALCIUM 8.9 01/08/2024 1250   PROT 6.5 01/08/2024 1250   ALBUMIN 4.2 01/08/2024 1250   AST 22 01/08/2024 1250   ALT 21 01/08/2024 1250   ALKPHOS 63 01/08/2024 1250   BILITOT 0.5 01/08/2024 1250   GFRNONAA 51 (L) 03/20/2018 1403   GFRAA 59 (L) 03/20/2018 1403     Assessment/Plan:   84 year old female history of hypertension, 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  Over the past few years, maintaining on Creon  6 capsules with meals 1-3 with snacks.  MRI 2022 stable - Continue Creon    IBS Chronic symptoms with increased constipation and persistent fecal incontinence. Mild improvement with fiber; further titration and adjunctive therapy needed. Symptom variability influenced by stress and seasons. She declined pelvic floor physical therapy. She is high risk due to comorbidities and age and had a reassuring recent colonoscopy in 2023.  Suspect pelvic floor component to leading to  difficulty with bowel movements. Decreased rectal tone on rectal exam last visit, otherwise unrevealing. Normal CTAP 02/2024 - Continue fiber supplementation, titrate dose as tolerated. - Initiated polyethylene glycol (Miralax) 1/2 cap every other day for two weeks. - Recommended footstool use to improve defecation mechanics. - Encouraged regular exercise, including squats, to support bowel function. - Advised adequate hydration, especially during physical activity. - Instructed to update symptoms via MyChart by Valentine's Day. - Scheduled follow-up in 8-12 weeks, with earlier contact if symptoms worsen or do not improve.   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

## 2024-06-18 NOTE — Progress Notes (Signed)
 Remote Loop Recorder Transmission

## 2024-06-19 ENCOUNTER — Other Ambulatory Visit: Payer: Self-pay | Admitting: Family Medicine

## 2024-06-21 ENCOUNTER — Other Ambulatory Visit: Payer: Self-pay | Admitting: Family Medicine

## 2024-06-21 NOTE — Telephone Encounter (Signed)
 Copied from CRM #[id number]. Topic: Clinical - Medication Refill >> Jun 21, 2024 10:54 AM Tanazia G wrote: Medication:  estradiol  (DOTTI ) 0.025 MG/24HR   Has the patient contacted their pharmacy? Yes (Agent: If no, request that the patient contact the pharmacy for the refill. If patient does not wish to contact the pharmacy document the reason why and proceed with request.) (Agent: If yes, when and what did the pharmacy advise?)  This is the patient's preferred pharmacy:  [facility] [address], [place] - [address] [place] [address] Phone: [phone] Fax: [phone]  Is this the correct pharmacy for this prescription? Yes If no, delete pharmacy and type the correct one.   Has the prescription been filled recently? Yes  Is the patient out of the medication? Yes  Has the patient been seen for an appointment in the last year OR does the patient have an upcoming appointment? Yes  Can we respond through MyChart? Yes  Agent: Please be advised that Rx refills may take up to 3 business days. We ask that you follow-up with your pharmacy.

## 2024-06-22 ENCOUNTER — Other Ambulatory Visit: Payer: Self-pay | Admitting: Family Medicine

## 2024-06-22 DIAGNOSIS — F419 Anxiety disorder, unspecified: Secondary | ICD-10-CM

## 2024-07-11 ENCOUNTER — Encounter

## 2024-08-02 ENCOUNTER — Encounter: Admitting: Family Medicine

## 2024-08-05 ENCOUNTER — Encounter: Admitting: Student

## 2024-08-11 ENCOUNTER — Encounter

## 2024-08-19 ENCOUNTER — Ambulatory Visit: Admitting: Gastroenterology

## 2024-09-11 ENCOUNTER — Encounter

## 2024-10-12 ENCOUNTER — Encounter
